# Patient Record
Sex: Female | Born: 1965 | Race: White | Hispanic: No | Smoking: Current every day smoker
Health system: Southern US, Community
[De-identification: ages and names within clinical notes are randomized; demographics above are authoritative.]

## PROBLEM LIST (undated history)

## (undated) DIAGNOSIS — I10 Essential (primary) hypertension: Secondary | ICD-10-CM

## (undated) DIAGNOSIS — G4730 Sleep apnea, unspecified: Secondary | ICD-10-CM

## (undated) DIAGNOSIS — C801 Malignant (primary) neoplasm, unspecified: Secondary | ICD-10-CM

## (undated) DIAGNOSIS — M79671 Pain in right foot: Secondary | ICD-10-CM

## (undated) DIAGNOSIS — T4145XA Adverse effect of unspecified anesthetic, initial encounter: Secondary | ICD-10-CM

## (undated) DIAGNOSIS — B351 Tinea unguium: Secondary | ICD-10-CM

## (undated) DIAGNOSIS — F329 Major depressive disorder, single episode, unspecified: Secondary | ICD-10-CM

## (undated) DIAGNOSIS — IMO0001 Reserved for inherently not codable concepts without codable children: Secondary | ICD-10-CM

## (undated) DIAGNOSIS — T8859XA Other complications of anesthesia, initial encounter: Secondary | ICD-10-CM

## (undated) DIAGNOSIS — J449 Chronic obstructive pulmonary disease, unspecified: Secondary | ICD-10-CM

## (undated) DIAGNOSIS — F32A Depression, unspecified: Secondary | ICD-10-CM

## (undated) DIAGNOSIS — M1990 Unspecified osteoarthritis, unspecified site: Secondary | ICD-10-CM

## (undated) DIAGNOSIS — F419 Anxiety disorder, unspecified: Secondary | ICD-10-CM

## (undated) HISTORY — PX: HERNIA REPAIR: SHX51

## (undated) HISTORY — PX: BREAST ENHANCEMENT SURGERY: SHX7

## (undated) HISTORY — PX: ANKLE FRACTURE SURGERY: SHX122

## (undated) HISTORY — DX: Major depressive disorder, single episode, unspecified: F32.9

## (undated) HISTORY — PX: WRIST FUSION: SHX839

## (undated) HISTORY — DX: Depression, unspecified: F32.A

## (undated) HISTORY — DX: Pain in right foot: M79.671

## (undated) HISTORY — PX: NASAL POLYP SURGERY: SHX186

## (undated) HISTORY — PX: POLYPECTOMY: SHX149

## (undated) HISTORY — PX: CARPAL TUNNEL RELEASE: SHX101

## (undated) HISTORY — DX: Malignant (primary) neoplasm, unspecified: C80.1

## (undated) SURGERY — COLONOSCOPY
Anesthesia: Monitor Anesthesia Care

---

## 2002-07-31 ENCOUNTER — Emergency Department (HOSPITAL_COMMUNITY): Admission: EM | Admit: 2002-07-31 | Discharge: 2002-07-31 | Payer: Self-pay | Admitting: Emergency Medicine

## 2002-07-31 ENCOUNTER — Encounter: Payer: Self-pay | Admitting: Emergency Medicine

## 2002-10-18 ENCOUNTER — Encounter: Payer: Self-pay | Admitting: Family Medicine

## 2002-10-18 ENCOUNTER — Ambulatory Visit (HOSPITAL_COMMUNITY): Admission: RE | Admit: 2002-10-18 | Discharge: 2002-10-18 | Payer: Self-pay | Admitting: Family Medicine

## 2002-11-23 ENCOUNTER — Emergency Department (HOSPITAL_COMMUNITY): Admission: EM | Admit: 2002-11-23 | Discharge: 2002-11-24 | Payer: Self-pay | Admitting: *Deleted

## 2003-04-12 ENCOUNTER — Emergency Department (HOSPITAL_COMMUNITY): Admission: EM | Admit: 2003-04-12 | Discharge: 2003-04-12 | Payer: Self-pay | Admitting: Emergency Medicine

## 2004-04-09 ENCOUNTER — Ambulatory Visit: Payer: Self-pay | Admitting: Family Medicine

## 2004-04-16 ENCOUNTER — Ambulatory Visit: Payer: Self-pay | Admitting: Family Medicine

## 2004-05-29 ENCOUNTER — Ambulatory Visit: Payer: Self-pay | Admitting: Family Medicine

## 2004-10-30 ENCOUNTER — Ambulatory Visit: Payer: Self-pay | Admitting: Family Medicine

## 2005-03-05 ENCOUNTER — Ambulatory Visit: Payer: Self-pay | Admitting: Family Medicine

## 2005-03-10 ENCOUNTER — Ambulatory Visit (HOSPITAL_COMMUNITY): Admission: RE | Admit: 2005-03-10 | Discharge: 2005-03-10 | Payer: Self-pay | Admitting: Family Medicine

## 2005-04-01 ENCOUNTER — Ambulatory Visit: Payer: Self-pay | Admitting: Orthopedic Surgery

## 2005-04-21 ENCOUNTER — Ambulatory Visit (HOSPITAL_COMMUNITY): Admission: RE | Admit: 2005-04-21 | Discharge: 2005-04-21 | Payer: Self-pay | Admitting: Orthopedic Surgery

## 2005-04-23 ENCOUNTER — Ambulatory Visit: Payer: Self-pay | Admitting: Orthopedic Surgery

## 2005-04-30 ENCOUNTER — Ambulatory Visit: Payer: Self-pay | Admitting: Orthopedic Surgery

## 2005-05-05 ENCOUNTER — Ambulatory Visit: Payer: Self-pay | Admitting: Orthopedic Surgery

## 2005-05-14 ENCOUNTER — Ambulatory Visit: Payer: Self-pay | Admitting: Orthopedic Surgery

## 2005-05-28 ENCOUNTER — Ambulatory Visit: Payer: Self-pay | Admitting: Orthopedic Surgery

## 2005-06-25 ENCOUNTER — Ambulatory Visit: Payer: Self-pay | Admitting: Orthopedic Surgery

## 2005-07-02 ENCOUNTER — Ambulatory Visit: Payer: Self-pay | Admitting: Family Medicine

## 2005-07-06 ENCOUNTER — Ambulatory Visit (HOSPITAL_COMMUNITY): Admission: RE | Admit: 2005-07-06 | Discharge: 2005-07-06 | Payer: Self-pay | Admitting: Family Medicine

## 2005-08-06 ENCOUNTER — Encounter (INDEPENDENT_AMBULATORY_CARE_PROVIDER_SITE_OTHER): Payer: Self-pay | Admitting: *Deleted

## 2005-08-06 ENCOUNTER — Other Ambulatory Visit: Admission: RE | Admit: 2005-08-06 | Discharge: 2005-08-06 | Payer: Self-pay | Admitting: Family Medicine

## 2005-08-06 ENCOUNTER — Ambulatory Visit: Payer: Self-pay | Admitting: Family Medicine

## 2005-08-06 LAB — CONVERTED CEMR LAB: Pap Smear: NORMAL

## 2005-08-07 ENCOUNTER — Ambulatory Visit (HOSPITAL_COMMUNITY): Admission: RE | Admit: 2005-08-07 | Discharge: 2005-08-07 | Payer: Self-pay | Admitting: Family Medicine

## 2006-02-02 ENCOUNTER — Ambulatory Visit: Payer: Self-pay | Admitting: Family Medicine

## 2006-06-10 ENCOUNTER — Ambulatory Visit: Payer: Self-pay | Admitting: Family Medicine

## 2006-08-09 ENCOUNTER — Ambulatory Visit: Payer: Self-pay | Admitting: Family Medicine

## 2006-08-09 ENCOUNTER — Other Ambulatory Visit: Admission: RE | Admit: 2006-08-09 | Discharge: 2006-08-09 | Payer: Self-pay | Admitting: Family Medicine

## 2006-08-09 ENCOUNTER — Encounter: Payer: Self-pay | Admitting: Family Medicine

## 2006-08-09 ENCOUNTER — Encounter (INDEPENDENT_AMBULATORY_CARE_PROVIDER_SITE_OTHER): Payer: Self-pay | Admitting: *Deleted

## 2006-08-09 LAB — CONVERTED CEMR LAB: Pap Smear: NORMAL

## 2006-12-16 ENCOUNTER — Ambulatory Visit (HOSPITAL_COMMUNITY): Admission: RE | Admit: 2006-12-16 | Discharge: 2006-12-16 | Payer: Self-pay | Admitting: Family Medicine

## 2007-02-24 ENCOUNTER — Encounter: Payer: Self-pay | Admitting: *Deleted

## 2007-02-24 DIAGNOSIS — L02419 Cutaneous abscess of limb, unspecified: Secondary | ICD-10-CM

## 2007-02-24 DIAGNOSIS — I10 Essential (primary) hypertension: Secondary | ICD-10-CM

## 2007-02-24 DIAGNOSIS — L03119 Cellulitis of unspecified part of limb: Secondary | ICD-10-CM

## 2007-02-24 DIAGNOSIS — G43909 Migraine, unspecified, not intractable, without status migrainosus: Secondary | ICD-10-CM

## 2007-07-05 ENCOUNTER — Ambulatory Visit: Payer: Self-pay | Admitting: Family Medicine

## 2007-08-11 ENCOUNTER — Encounter: Payer: Self-pay | Admitting: Family Medicine

## 2007-08-11 LAB — CONVERTED CEMR LAB
BUN: 14 mg/dL (ref 6–23)
Basophils Absolute: 0 10*3/uL (ref 0.0–0.1)
Basophils Relative: 0 % (ref 0–1)
CO2: 21 meq/L (ref 19–32)
Calcium: 9.4 mg/dL (ref 8.4–10.5)
Chloride: 104 meq/L (ref 96–112)
Cholesterol: 160 mg/dL (ref 0–200)
Creatinine, Ser: 0.75 mg/dL (ref 0.40–1.20)
Eosinophils Absolute: 0.1 10*3/uL (ref 0.0–0.7)
Eosinophils Relative: 1 % (ref 0–5)
Glucose, Bld: 97 mg/dL (ref 70–99)
HCT: 40.8 % (ref 36.0–46.0)
HDL: 72 mg/dL (ref >39)
Hemoglobin: 13.9 g/dL (ref 12.0–15.0)
LDL Cholesterol: 77 mg/dL (ref 0–99)
Lymphocytes Relative: 23 % (ref 12–46)
Lymphs Abs: 2.2 10*3/uL (ref 0.7–4.0)
MCHC: 34.1 g/dL (ref 30.0–36.0)
MCV: 92.5 fL (ref 78.0–100.0)
Monocytes Absolute: 0.4 10*3/uL (ref 0.1–1.0)
Monocytes Relative: 4 % (ref 3–12)
Neutro Abs: 6.8 10*3/uL (ref 1.7–7.7)
Neutrophils Relative %: 72 % (ref 43–77)
Platelets: 234 10*3/uL (ref 150–400)
Potassium: 4.1 meq/L (ref 3.5–5.3)
RBC: 4.41 M/uL (ref 3.87–5.11)
RDW: 13.6 % (ref 11.5–15.5)
Sodium: 139 meq/L (ref 135–145)
Total CHOL/HDL Ratio: 2.2
Triglycerides: 54 mg/dL (ref <150)
VLDL: 11 mg/dL (ref 0–40)
WBC: 9.5 10*3/uL (ref 4.0–10.5)

## 2007-09-01 ENCOUNTER — Other Ambulatory Visit: Admission: RE | Admit: 2007-09-01 | Discharge: 2007-09-01 | Payer: Self-pay | Admitting: Family Medicine

## 2007-09-01 ENCOUNTER — Ambulatory Visit: Payer: Self-pay | Admitting: Family Medicine

## 2007-09-01 ENCOUNTER — Encounter: Payer: Self-pay | Admitting: Family Medicine

## 2007-09-01 LAB — CONVERTED CEMR LAB: Pap Smear: ABNORMAL

## 2007-09-13 ENCOUNTER — Encounter: Payer: Self-pay | Admitting: Family Medicine

## 2007-11-11 ENCOUNTER — Encounter: Payer: Self-pay | Admitting: Family Medicine

## 2007-11-11 ENCOUNTER — Ambulatory Visit (HOSPITAL_COMMUNITY): Admission: RE | Admit: 2007-11-11 | Discharge: 2007-11-11 | Payer: Self-pay | Admitting: General Surgery

## 2007-12-21 ENCOUNTER — Ambulatory Visit (HOSPITAL_COMMUNITY): Admission: RE | Admit: 2007-12-21 | Discharge: 2007-12-21 | Payer: Self-pay | Admitting: Family Medicine

## 2008-01-18 ENCOUNTER — Ambulatory Visit: Payer: Self-pay | Admitting: Family Medicine

## 2008-05-03 ENCOUNTER — Telehealth: Payer: Self-pay | Admitting: Family Medicine

## 2008-07-13 ENCOUNTER — Encounter: Payer: Self-pay | Admitting: Family Medicine

## 2008-08-17 ENCOUNTER — Emergency Department (HOSPITAL_COMMUNITY): Admission: EM | Admit: 2008-08-17 | Discharge: 2008-08-17 | Payer: Self-pay | Admitting: Emergency Medicine

## 2008-10-01 ENCOUNTER — Telehealth: Payer: Self-pay | Admitting: Family Medicine

## 2008-10-08 ENCOUNTER — Ambulatory Visit: Payer: Self-pay | Admitting: Family Medicine

## 2008-10-08 ENCOUNTER — Other Ambulatory Visit: Admission: RE | Admit: 2008-10-08 | Discharge: 2008-10-08 | Payer: Self-pay | Admitting: Family Medicine

## 2008-10-08 ENCOUNTER — Encounter: Payer: Self-pay | Admitting: Family Medicine

## 2008-10-08 LAB — CONVERTED CEMR LAB: OCCULT 1: NEGATIVE

## 2008-10-09 DIAGNOSIS — F17200 Nicotine dependence, unspecified, uncomplicated: Secondary | ICD-10-CM

## 2008-10-10 ENCOUNTER — Telehealth: Payer: Self-pay | Admitting: Family Medicine

## 2008-11-19 ENCOUNTER — Telehealth: Payer: Self-pay | Admitting: Family Medicine

## 2008-12-12 ENCOUNTER — Ambulatory Visit: Payer: Self-pay | Admitting: Family Medicine

## 2008-12-24 ENCOUNTER — Ambulatory Visit (HOSPITAL_COMMUNITY): Admission: RE | Admit: 2008-12-24 | Discharge: 2008-12-24 | Payer: Self-pay | Admitting: Family Medicine

## 2009-04-17 ENCOUNTER — Telehealth: Payer: Self-pay | Admitting: Family Medicine

## 2009-06-11 ENCOUNTER — Other Ambulatory Visit: Admission: RE | Admit: 2009-06-11 | Discharge: 2009-06-11 | Payer: Self-pay | Admitting: Family Medicine

## 2009-06-11 ENCOUNTER — Ambulatory Visit: Payer: Self-pay | Admitting: Family Medicine

## 2009-06-11 DIAGNOSIS — R5381 Other malaise: Secondary | ICD-10-CM

## 2009-06-11 DIAGNOSIS — R5383 Other fatigue: Secondary | ICD-10-CM

## 2009-06-11 DIAGNOSIS — L989 Disorder of the skin and subcutaneous tissue, unspecified: Secondary | ICD-10-CM | POA: Insufficient documentation

## 2009-06-11 LAB — CONVERTED CEMR LAB
OCCULT 1: NEGATIVE
Pap Smear: NEGATIVE

## 2009-06-12 ENCOUNTER — Encounter: Payer: Self-pay | Admitting: Family Medicine

## 2009-06-19 ENCOUNTER — Telehealth: Payer: Self-pay | Admitting: Family Medicine

## 2009-08-13 ENCOUNTER — Ambulatory Visit: Payer: Self-pay | Admitting: Family Medicine

## 2009-09-26 ENCOUNTER — Encounter: Payer: Self-pay | Admitting: Family Medicine

## 2009-09-27 ENCOUNTER — Ambulatory Visit: Admission: RE | Admit: 2009-09-27 | Discharge: 2009-09-27 | Payer: Self-pay | Admitting: Family Medicine

## 2009-10-09 ENCOUNTER — Encounter: Payer: Self-pay | Admitting: Family Medicine

## 2009-10-15 ENCOUNTER — Ambulatory Visit: Payer: Self-pay | Admitting: Family Medicine

## 2009-10-16 ENCOUNTER — Encounter: Payer: Self-pay | Admitting: Family Medicine

## 2009-10-16 LAB — CONVERTED CEMR LAB
BUN: 15 mg/dL (ref 6–23)
Basophils Absolute: 0 10*3/uL (ref 0.0–0.1)
Basophils Relative: 0 % (ref 0–1)
CO2: 21 meq/L (ref 19–32)
Calcium: 8.5 mg/dL (ref 8.4–10.5)
Chloride: 106 meq/L (ref 96–112)
Cholesterol: 184 mg/dL (ref 0–200)
Creatinine, Ser: 0.63 mg/dL (ref 0.40–1.20)
Eosinophils Absolute: 0.1 10*3/uL (ref 0.0–0.7)
Eosinophils Relative: 1 % (ref 0–5)
Glucose, Bld: 97 mg/dL (ref 70–99)
HCT: 40.9 % (ref 36.0–46.0)
HDL: 64 mg/dL (ref >39)
Hemoglobin: 14 g/dL (ref 12.0–15.0)
LDL Cholesterol: 103 mg/dL — ABNORMAL HIGH (ref 0–99)
Lymphocytes Relative: 30 % (ref 12–46)
Lymphs Abs: 2.8 10*3/uL (ref 0.7–4.0)
MCHC: 34.2 g/dL (ref 30.0–36.0)
MCV: 90.5 fL (ref 78.0–100.0)
Monocytes Absolute: 0.5 10*3/uL (ref 0.1–1.0)
Monocytes Relative: 5 % (ref 3–12)
Neutro Abs: 6 10*3/uL (ref 1.7–7.7)
Neutrophils Relative %: 64 % (ref 43–77)
Platelets: 245 10*3/uL (ref 150–400)
Potassium: 4.2 meq/L (ref 3.5–5.3)
RBC: 4.52 M/uL (ref 3.87–5.11)
RDW: 13.2 % (ref 11.5–15.5)
Sodium: 140 meq/L (ref 135–145)
TSH: 2.598 microintl units/mL (ref 0.350–4.500)
Total CHOL/HDL Ratio: 2.9
Triglycerides: 84 mg/dL (ref <150)
VLDL: 17 mg/dL (ref 0–40)
Vit D, 25-Hydroxy: 33 ng/mL (ref 30–89)
WBC: 9.4 10*3/uL (ref 4.0–10.5)

## 2009-10-22 DIAGNOSIS — E669 Obesity, unspecified: Secondary | ICD-10-CM

## 2009-10-22 DIAGNOSIS — F418 Other specified anxiety disorders: Secondary | ICD-10-CM

## 2009-10-22 DIAGNOSIS — E663 Overweight: Secondary | ICD-10-CM | POA: Insufficient documentation

## 2010-01-06 ENCOUNTER — Ambulatory Visit (HOSPITAL_COMMUNITY): Admission: RE | Admit: 2010-01-06 | Discharge: 2010-01-06 | Payer: Self-pay | Admitting: Family Medicine

## 2010-04-08 NOTE — Letter (Signed)
Summary: medical info for [facility]  medical info for [facility]   Imported By: Lind Guest 06/12/2009 15:06:26  _____________________________________________________________________  External Attachment:    Type:   Image     Comment:   External Document

## 2010-04-08 NOTE — Progress Notes (Signed)
Summary: [facility]  [facility]   Imported By: Lind Guest 10/18/2009 15:42:59  _____________________________________________________________________  External Attachment:    Type:   Image     Comment:   External Document

## 2010-04-08 NOTE — Progress Notes (Signed)
Summary: refills  Phone Note Call from Patient   Summary of Call: pt has ano refills on meds. But needs to get refilled. Making her appt to come in. pt has appt to come in . but not soon because schedule full [phone]  Initial call taken by: Rudene Anda,  April 17, 2009 11:07 AM  Follow-up for Phone Call        returned call, unavailable meds sent Follow-up by: Worthy Keeler LPN,  April 17, 2009 11:18 AM    Prescriptions: BENICAR HCT 20-12.5 MG TABS (OLMESARTAN MEDOXOMIL-HCTZ) Take 1 tablet by mouth once a day  #30 Each x 2   Entered by:   Worthy Keeler LPN   Authorized by:   Syliva Overman MD   Signed by:   Worthy Keeler LPN on 78/46/9629   Method used:   Electronically to        [facility]  [address] (retail)       [address]       [place], [place]  [address]       Ph: [phone]       Fax: [phone]   RxID:   [id number] VENLAFAXINE HCL 75 MG  TABS (VENLAFAXINE HCL) Take 1 tablet by mouth once a day  #30 Each x 2   Entered by:   Worthy Keeler LPN   Authorized by:   Syliva Overman MD   Signed by:   Worthy Keeler LPN on 51/88/4166   Method used:   Electronically to        [facility]  [address] (retail)       [address]       [place], [place]  [address]       Ph: [phone]       Fax: [phone]   RxID:   [id number] GABAPENTIN 800 MG  TABS (GABAPENTIN) Take one tablet by mouth three times a day  #90 x 2   Entered by:   Worthy Keeler LPN   Authorized by:   Syliva Overman MD   Signed by:   Worthy Keeler LPN on 07/37/1062   Method used:   Electronically to        [facility]  [address] (retail)       [address]       [place], [place]  [address]       Ph: [phone]       Fax: [phone]   RxID:   [id number] WELLBUTRIN SR 150 MG  TB12 (BUPROPION HCL) Take one tablet by mouth once a day  #30 x 2   Entered by:   Worthy Keeler LPN   Authorized by:   Syliva Overman MD   Signed by:    Worthy Keeler LPN on 58/52/7782   Method used:   Electronically to        [facility]  [address] (retail)       [address]       [place], [place]  [address]       Ph: [phone]       Fax: [phone]   RxID:   [id number]

## 2010-04-08 NOTE — Assessment & Plan Note (Signed)
Summary: physical   Vital Signs:  Patient profile:   45 year old female Menstrual status:  perimenopausal Height:      62.5 inches Weight:      174.50 pounds BMI:     31.52 O2 Sat:      96 % Pulse rate:   78 / minute Pulse rhythm:   regular Resp:     16 per minute BP sitting:   114 / 78  (left arm) Cuff size:   regular  Vitals Entered By: Everitt Amber LPN (June 12, 5282 2:41 PM)  Nutrition Counseling: Patient's BMI is greater than 25 and therefore counseled on weight management options.  Vision Screening:Left eye w/o correction: 20 / 25 Right Eye w/o correction: 20 / 30 Both eyes w/o correction:  20/ 25  Color vision testing: normal      Vision Entered By: Everitt Amber LPN (June 12, 1322 2:41 PM)   History of Present Illness: pt c/o increasedanduncontrolled headaches, no localised weakness or numbness, like hwer classic migraine, sbitemporal butsometimesextending to post neck. she also is concerned about a 14 pound weight gain and wants to resume phentermine. She is still smking and is trying to quit. she recently developed a blister on herabdomes after beingin thetanning bed and wants this checked.  Preventive Screening-Counseling & Management  Alcohol-Tobacco     Smoking Cessation Counseling: yes  Current Medications (verified): 1)  Wellbutrin Sr 150 Mg  Tb12 (Bupropion Hcl) .... Take One Tablet By Mouth Once A Day 2)  Gabapentin 800 Mg  Tabs (Gabapentin) .... Take One Tablet By Mouth Three Times A Day 3)  Venlafaxine Hcl 75 Mg  Tabs (Venlafaxine Hcl) .... Take 1 Tablet By Mouth Once A Day 4)  Benicar Hct 20-12.5 Mg Tabs (Olmesartan Medoxomil-Hctz) .... Take 1 Tablet By Mouth Once A Day 5)  Flexeril 10 Mg Tabs (Cyclobenzaprine Hcl) .... Take 1 Tab By Mouth At Bedtime  As Needed 6)  Phentermine Hcl 37.5 Mg Tabs (Phentermine Hcl) .... Take 1 Tablet By Mouth Once A Day  Allergies (verified): No Known Drug Allergies  Review of Systems      See HPI General:   Denies chills, fatigue, and fever. Eyes:  Complains of blurring; denies discharge, eye pain, and red eye; states she has never been able to see well with herglasses which are over 2 yrsold, she does not use hem much. ENT:  Denies earache, hoarseness, nasal congestion, and sinus pressure. CV:  Denies chest pain or discomfort, difficulty breathing while lying down, palpitations, and swelling of feet. Resp:  Denies cough and sputum productive. GI:  Denies abdominal pain, constipation, diarrhea, nausea, and vomiting. GU:  Denies dysuria, incontinence, and urinary frequency. MS:  Denies joint pain, low back pain, mid back pain, and stiffness. Derm:  Complains of lesion(s); red blistering area on her ant abdomen which came up after she had a tanniong session l;ast week, goes on avg once weekly. Neuro:  Complains of headaches; 5 m,onth h/o dauily bitemporal throbbing headaches associated with nausea, only using goodies owders. Also has pain at the back opf the neck fro her  shoulder has respoded well to injections in the past. Psych:  Complains of anxiety and depression; denies irritability, mental problems, suicidal thoughts/plans, thoughts of violence, and unusual visions or sounds. Endo:  Denies cold intolerance, excessive hunger, excessive thirst, excessive urination, heat intolerance, polyuria, and weight change. Heme:  Complains of abnormal bruising; denies bleeding; chronic bruising on legsa. Allergy:  Complains of seasonal  allergies.  Physical Exam  General:  Well-developed,well-nourished,in no acute distress; alert,appropriate and cooperative throughout examination Head:  Normocephalic and atraumatic without obvious abnormalities. No apparent alopecia or balding. Eyes:  No corneal or conjunctival inflammation noted. EOMI. Perrla. Funduscopic exam benign, without hemorrhages, exudates or papilledema. Vision grossly normal. Ears:  External ear exam shows no significant lesions or deformities.   Otoscopic examination reveals clear canals, tympanic membranes are intact bilaterally without bulging, retraction, inflammation or discharge. Hearing is grossly normal bilaterally. Nose:  External nasal examination shows no deformity or inflammation. Nasal mucosa are pink and moist without lesions or exudates. Mouth:  Oral mucosa and oropharynx without lesions or exudates.  Teeth in good repair. Neck:  No deformities, masses, or tenderness noted. Chest Wall:  No deformities, masses, or tenderness noted. Breasts:  No mass, nodules, thickening, tenderness, bulging, retraction, inflamation, nipple discharge or skin changes noted.   Lungs:  Normal respiratory effort, chest expands symmetrically. Lungs are clear to auscultation, no crackles or wheezes.decreased throughout Heart:  Normal rate and regular rhythm. S1 and S2 normal without gallop, murmur, click, rub or other extra sounds. Abdomen:  Bowel sounds positive,abdomen soft and non-tender without masses, organomegaly or hernias noted. Rectal:  No external abnormalities noted. Normal sphincter tone. No rectal masses or tenderness.guaic neg stool Genitalia:  Normal introitus for age, no external lesions, no vaginal discharge, mucosa pink and moist, no vaginal or cervical lesions, no vaginal atrophy, no friaility or hemorrhage, normal uterus size and position, no adnexal masses or tenderness Msk:  No deformity or scoliosis noted of thoracic or lumbar spine.   Pulses:  R and L carotid,radial,femoral,dorsalis pedis and posterior tibial pulses are full and equal bilaterally Extremities:  No clubbing, cyanosis, edema, or deformity noted with normal full range of motion of all joints.   Neurologic:  No cranial nerve deficits noted. Station and gait are normal. Plantar reflexes are down-going bilaterally. DTRs are symmetrical throughout. Sensory, motor and coordinative functions appear intact. Skin:  erytematous macular lesion on anterior abdominal wall.no  skin breakdown Cervical Nodes:  No lymphadenopathy noted Axillary Nodes:  No palpable lymphadenopathy Inguinal Nodes:  No significant adenopathy Psych:  Cognition and judgment appear intact. Alert and cooperative with normal attention span and concentration. No apparent delusions, illusions, hallucinations   Impression & Recommendations:  Problem # 1:  SPECIAL SCREENING FOR MALIGNANT NEOPLASMS COLON (ICD-V76.51) Assessment Comment Only  Orders: Hemoccult Guaiac-1 spec.(in office) (82270) negative  Problem # 2:  NICOTINE ADDICTION (ICD-305.1) Assessment: Unchanged  Encouraged smoking cessation and discussed different methods for smoking cessation.   Problem # 3:  SCREENING FOR MALIGNANT NEOPLASM OF THE CERVIX (ICD-V76.2) Assessment: Comment Only  Orders: Pap Smear ([address])  Problem # 4:  OVERWEIGHT (ICD-278.02) Assessment: Deteriorated  Ht: 62.5 (06/11/2009)   Wt: 174.50 (06/11/2009)   BMI: 31.52 (06/11/2009)  Problem # 5:  MIGRAINE HEADACHE (ICD-346.90) Assessment: Deteriorated  Her updated medication list for this problem includes:    Maxalt-mlt 10 Mg Tbdp (Rizatriptan benzoate) ..... One at headache onset may repeat after 2 hrs if needed , maximum of 2 tablets in 24 hours, max twice weekly  Orders: Neurology Referral (Neuro)  Problem # 6:  ESSENTIAL HYPERTENSION, BENIGN (ICD-401.1) Assessment: Unchanged  Her updated medication list for this problem includes:    Benicar Hct 20-12.5 Mg Tabs (Olmesartan medoxomil-hctz) .Marland Kitchen... Take 1 tablet by mouth once a day  Orders: T-Basic Metabolic Panel [phone])  BP today: 114/78 Prior BP: 110/80 (12/12/2008)  Labs Reviewed: K+: 4.1 (08/11/2007) Creat: :  0.75 (08/11/2007)   Chol: 160 (08/11/2007)   HDL: 72 (08/11/2007)   LDL: 77 (08/11/2007)   TG: 54 (08/11/2007)  Problem # 7:  SKIN LESION (ICD-709.9) Assessment: Comment Only pt advised against tanning bed, she is to use antibacterial oint twice daily for 3 weeks if  rash persits she is to call back for derm referral  Complete Medication List: 1)  Wellbutrin Sr 150 Mg Tb12 (Bupropion hcl) .... Take one tablet by mouth once a day 2)  Gabapentin 800 Mg Tabs (Gabapentin) .... Take one tablet by mouth three times a day 3)  Venlafaxine Hcl 75 Mg Tabs (Venlafaxine hcl) .... Take 1 tablet by mouth once a day 4)  Benicar Hct 20-12.5 Mg Tabs (Olmesartan medoxomil-hctz) .... Take 1 tablet by mouth once a day 5)  Flexeril 10 Mg Tabs (Cyclobenzaprine hcl) .... Take 1 tab by mouth at bedtime  as needed 6)  Phentermine Hcl 37.5 Mg Tabs (Phentermine hcl) .... Take 1 tablet by mouth once a day 7)  Maxalt-mlt 10 Mg Tbdp (Rizatriptan benzoate) .... One at headache onset may repeat after 2 hrs if needed , maximum of 2 tablets in 24 hours, max twice weekly  Other Orders: T-Lipid Profile ([id number]) T-CBC w/Diff [phone]) T-TSH [phone]) T-Vitamin D (25-Hydroxy) [phone])  Patient Instructions: 1)  Please schedule a follow-up appointment in 2 months. 2)  It is important that you exercise regularly at least 20 minutes 5 times a week. If you develop chest pain, have severe difficulty breathing, or feel very tired , stop exercising immediately and seek medical attention. 3)  You need to lose weight. Consider a lower calorie diet and regular exercise.  4)  Tobacco is very bad for your health and your loved ones! You Should stop smoking!. 5)  Stop Smoking Tips: Choose a Quit date. Cut down before the Quit date. decide what you will do as a substitute when you feel the urge to smoke(gum,toothpick,exercise). 6)  BMP prior to visit, ICD-9: 7)  Lipid Panel prior to visit, ICD-9: 8)  TSH prior to visit, ICD-9:   fasting asap 9)  CBC w/ Diff prior to visit, ICD-9: 10)  Vit D 11)  You will be referred to the headache clininc, and pls call back in 3 weeks for referral to dermatology if the rash on your stomach is still there . pls use antibiotic oint twicedaily, and stay  away from the tanning beds pls Prescriptions: PHENTERMINE HCL 37.5 MG TABS (PHENTERMINE HCL) Take 1 tablet by mouth once a day  #30 x 1   Entered by:   Everitt Amber LPN   Authorized by:   Syliva Overman MD   Signed by:   Everitt Amber LPN on 24/40/1027   Method used:   Printed then faxed to ...       [facility]  [address] [facility] (retail)       [address]       [place], [place]  [address]       Ph: [phone]       Fax: [phone]   RxID:   [id number] MAXALT-MLT 10 MG TBDP (RIZATRIPTAN BENZOATE) one at headache onset may repeat after 2 hrs if needed , maximum of 2 tablets in 24 hours, max twice weekly  #8 x 3   Entered and Authorized by:   Syliva Overman MD   Signed by:   Syliva Overman MD on 06/11/2009   Method used:   Electronically  to        [facility] [address] (retail)       [address]       [place], [place]  [address]       Ph: [phone]       Fax: [phone]   RxID:   [id number]   Laboratory Results    Stool - Occult Blood Hemmoccult #1: negative Date: 06/11/2009 Comments: [id number] 9R 8/11 118 1012

## 2010-04-08 NOTE — Assessment & Plan Note (Signed)
Summary: office visit   Vital Signs:  Patient profile:   45 year old female Menstrual status:  perimenopausal Height:      62.5 inches Weight:      174.25 pounds BMI:     31.48 O2 Sat:      97 % Pulse rate:   83 / minute Pulse rhythm:   regular Resp:     16 per minute BP sitting:   114 / 78 Cuff size:   regular  Vitals Entered By: Everitt Amber LPN (August 14, 9627 4:43 PM)  Nutrition Counseling: Patient's BMI is greater than 25 and therefore counseled on weight management options. CC: Follow up chronic problems, migraines are getting worse   CC:  Follow up chronic problems and migraines are getting worse.  History of Present Illness: Reports  that t she is doing fairly well, except that her migraines are worsening, and she continues to smoke. She has an upcoming appt with neurology. She has not been consistent with the phentermine, states she does better when she takes it consistently and wants to do so. Denies recent fever or chills. Denies sinus pressure, nasal congestion , ear pain or sore throat. Denies chest congestion, or cough productive of sputum. Denies chest pain, palpitations, PND, orthopnea or leg swelling. Denies abdominal pain, nausea, vomitting, diarrhea or constipation. Denies change in bowel movements or bloody stool. Denies dysuria , frequency, incontinence or hesitancy. Denies  joint pain, swelling, or reduced mobility. Denies , vertigo, seizures. Denies depression, anxiety or insomnia.    Preventive Screening-Counseling & Management  Alcohol-Tobacco     Smoking Cessation Counseling: yes  Allergies: No Known Drug Allergies  Review of Systems      See HPI General:  Complains of fatigue and sleep disorder; reports excessive daytoime sleepiness and excessive snoring. Eyes:  Denies blurring and discharge. Derm:  Complains of lesion(s); concerned about lesion which appears to have changed on her leg, wants skin eval. Neuro:  Complains of headaches;  denies difficulty with concentration, disturbances in coordination, poor balance, seizures, and sensation of room spinning. Psych:  Complains of anxiety and depression; denies suicidal thoughts/plans, thoughts of violence, and unusual visions or sounds; controlled on meds, and improved. Allergy:  Denies hives or rash and itching eyes.  Physical Exam  General:  Well-developed,well-nourished,in no acute distress; alert,appropriate and cooperative throughout examination HEENT: No facial asymmetry,  EOMI, No sinus tenderness, TM's Clear, oropharynx  pink and moist.   Chest: Clear to auscultation bilaterally.  CVS: S1, S2, No murmurs, No S3.   Abd: Soft, Nontender.  MS: Adequate ROM spine, hips, shoulders and knees.  Ext: No edema.   CNS: CN 2-12 intact, power tone and sensation normal throughout.   Skin: Intact,  freckled with hyperpigmented lesions, h/o tanning beds and excessive sun exposure.  Psych: Good eye contact, normal affect.  Memory intact, not anxious or depressed appearing.    Impression & Recommendations:  Problem # 1:  SKIN LESION (ICD-709.9) Assessment Comment Only  Future Orders: Dermatology Referral (Derma) ... 08/14/2009  Problem # 2:  FATIGUE (ICD-780.79) Assessment: Deteriorated  Orders: Sleep Disorder Referral (Sleep Disorder)  Problem # 3:  NICOTINE ADDICTION (ICD-305.1) Assessment: Unchanged  Encouraged smoking cessation and discussed different methods for smoking cessation.   Problem # 4:  MIGRAINE HEADACHE (ICD-346.90) Assessment: Deteriorated  Her updated medication list for this problem includes:    Maxalt-mlt 10 Mg Tbdp (Rizatriptan benzoate) ..... One at headache onset may repeat after 2 hrs if needed , maximum  of 2 tablets in 24 hours, max twice weekly has eval by neurology in the near future  Problem # 5:  ESSENTIAL HYPERTENSION, BENIGN (ICD-401.1) Assessment: Unchanged  Her updated medication list for this problem includes:    Benicar Hct  20-12.5 Mg Tabs (Olmesartan medoxomil-hctz) .Marland Kitchen... Take 1 tablet by mouth once a day  BP today: 114/78 Prior BP: 114/78 (06/11/2009)  Labs Reviewed: K+: 4.1 (08/11/2007) Creat: : 0.75 (08/11/2007)   Chol: 160 (08/11/2007)   HDL: 72 (08/11/2007)   LDL: 77 (08/11/2007)   TG: 54 (08/11/2007)  Problem # 6:  ANXIETY DEPRESSION (ICD-300.4) Assessment: Improved continue velafexine and wellbutrin  Problem # 7:  NICOTINE ADDICTION (ICD-305.1)  Problem # 8:  OVERWEIGHT (ICD-278.02)  Complete Medication List: 1)  Wellbutrin Sr 150 Mg Tb12 (Bupropion hcl) .... Take one tablet by mouth once a day 2)  Gabapentin 800 Mg Tabs (Gabapentin) .... Take one tablet by mouth three times a day 3)  Venlafaxine Hcl 75 Mg Tabs (Venlafaxine hcl) .... Take 1 tablet by mouth once a day 4)  Benicar Hct 20-12.5 Mg Tabs (Olmesartan medoxomil-hctz) .... Take 1 tablet by mouth once a day 5)  Flexeril 10 Mg Tabs (Cyclobenzaprine hcl) .... Take 1 tab by mouth at bedtime  as needed 6)  Maxalt-mlt 10 Mg Tbdp (Rizatriptan benzoate) .... One at headache onset may repeat after 2 hrs if needed , maximum of 2 tablets in 24 hours, max twice weekly 7)  Phentermine Hcl 37.5 Mg Tabs (Phentermine hcl) .... Take 1 tablet by mouth once a day  Patient Instructions: 1)  Please schedule a follow-up appointment in 2 months. 2)  Tobacco is very bad for your health and your loved ones! You Should stop smoking!. 3)  Stop Smoking Tips: Choose a Quit date. Cut down before the Quit date. decide what you will do as a substitute when you feel the urge to smoke(gum,toothpick,exercise). 4)  inc dose of phentermine toione daily, also pls commit to 30 mins at least 5 days per week of exercise 5)  You need to see a skin doctor, we wil refer you.We will also refer you for a sleep study 6)  i am glad that your bP is good, and pls keep appt at headache center. Prescriptions: PHENTERMINE HCL 37.5 MG TABS (PHENTERMINE HCL) Take 1 tablet by mouth once a  day  #30 x 1   Entered and Authorized by:   Syliva Overman MD   Signed by:   Syliva Overman MD on 08/13/2009   Method used:   Printed then faxed to ...       [facility]* (retail)       [address]       [address]       [place], [place]  [address]       Ph: [phone]       Fax: [phone]   RxID:   [id number] MAXALT-MLT 10 MG TBDP (RIZATRIPTAN BENZOATE) one at headache onset may repeat after 2 hrs if needed , maximum of 2 tablets in 24 hours, max twice weekly  #8 x 3   Entered by:   Everitt Amber LPN   Authorized by:   Syliva Overman MD   Signed by:   Everitt Amber LPN on 25/95/6387   Method used:   Electronically to        [facility]  [address] [address] [facility] (retail)       [address]  [place], [place]  [address]       Ph: [phone]       Fax: [phone]   RxID:   [id number]

## 2010-04-08 NOTE — Assessment & Plan Note (Signed)
Summary: office visit   Vital Signs:  Patient profile:   45 year old female Menstrual status:  perimenopausal Height:      62.5 inches Weight:      172.75 pounds BMI:     31.21 O2 Sat:      97 % Pulse rate:   85 / minute Pulse rhythm:   regular Resp:     16 per minute BP sitting:   112 / 80  (left arm) Cuff size:   regular  Vitals Entered By: Everitt Amber LPN (October 15, 2009 4:11 PM)  Nutrition Counseling: Patient's BMI is greater than 25 and therefore counseled on weight management options. CC: Follow up chronic problems   CC:  Follow up chronic problems.  History of Present Illness: Reports  that she is recently doing better than she had been. Denies recent fever or chills. Denies sinus pressure, nasal congestion , ear pain or sore throat. Denies chest congestion, or cough productive of sputum. Denies chest pain, palpitations, PND, orthopnea or leg swelling. Denies abdominal pain, nausea, vomitting, diarrhea or constipation. Denies change in bowel movements or bloody stool. Denies dysuria , frequency, incontinence or hesitancy. Denies  joint pain, swelling, or reduced mobility. Denies headaches, vertigo, seizures.She has been to the headache clinic and with her current treatment her symptoms have improved greatly. for the first 6 months of the year, she had been experiencing depression and social withdrawalwith the deterioration in her marriage.She staes she has now recovered from this and is ready to move on. Denies  rash, lesions, or itch. She has not lost weight, but with increased attention to exercise and control in her eating she anticipates a change.     Current Medications (verified): 1)  Wellbutrin Sr 150 Mg  Tb12 (Bupropion Hcl) .... Take One Tablet By Mouth Once A Day 2)  Gabapentin 800 Mg  Tabs (Gabapentin) .... Take One Tablet By Mouth Three Times A Day 3)  Venlafaxine Hcl 75 Mg  Tabs (Venlafaxine Hcl) .... Take 1 Tablet By Mouth Once A Day 4)  Benicar Hct  20-12.5 Mg Tabs (Olmesartan Medoxomil-Hctz) .... Take 1 Tablet By Mouth Once A Day 5)  Flexeril 10 Mg Tabs (Cyclobenzaprine Hcl) .... Take 1 Tab By Mouth At Bedtime  As Needed 6)  Phentermine Hcl 37.5 Mg Tabs (Phentermine Hcl) .... Take 1 Tablet By Mouth Once A Day 7)  Naproxen Sodium 550 Mg Tabs (Naproxen Sodium) .... One Tab Every 12 Hrs As Needed 8)  Skelaxin 800 Mg Tabs (Metaxalone) .... One Tab 3-4 Times Daily As Needed  Allergies (verified): No Known Drug Allergies  Past History:  Past medical, surgical, family and social histories (including risk factors) reviewed, and no changes noted (except as noted below).  Past Medical History:   MIGRAINE HEADACHE (ICD-346.90) NICOTINE ADDICTION (ICD-305.1) depression ESSENTIAL HYPERTENSION, BENIGN (ICD-401.1) obesity  Past Surgical History: Reviewed history from 07/19/2007 and no changes required. Nasal Polypectomy 1990 Carpal tunnel release right and left 2001-2002 Right ankle surgery 2002  Family History: Reviewed history from 02/24/2007 and no changes required. Mom 43 healthy Dad 75 increased lipdema no brothers  no sisters  Social History: Reviewed history from 02/24/2007 and no changes required. 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 Eyes:  Denies discharge and eye pain. Endo:  Denies cold intolerance, excessive hunger, excessive thirst, excessive urination, heat intolerance, polyuria, and weight  change. Heme:  Denies abnormal bruising and bleeding. Allergy:  Denies hives or rash and itching eyes.  Physical Exam  General:  Well-developed,well-nourished,in no acute distress; alert,appropriate and cooperative throughout examination HEENT: No facial asymmetry,  EOMI, No sinus tenderness, TM's Clear, oropharynx  pink and moist.   Chest: Clear to auscultation bilaterally.  CVS: S1, S2, No  murmurs, No S3.   Abd: Soft, Nontender.  MS: Adequate ROM spine, hips, shoulders and knees.  Ext: No edema.   CNS: CN 2-12 intact, power tone and sensation normal throughout.   Skin: Intact, no visible lesions or rashes.  Psych: Good eye contact, normal affect.  Memory intact, not anxious or depressed appearing.    Impression & Recommendations:  Problem # 1:  CONTRACEPTIVE MANAGEMENT (ICD-V25.09) Assessment Comment Only  Orders: Gynecologic Referral (Gyn), pt referred for IUD placement  Problem # 2:  OBESITY (ICD-278.00) Assessment: Unchanged  Ht: 62.5 (10/15/2009)   Wt: 172.75 (10/15/2009)   BMI: 31.21 (10/15/2009)  Problem # 3:  NICOTINE ADDICTION (ICD-305.1) Assessment: Deteriorated  Encouraged smoking cessation and discussed different methods for smoking cessation.   Problem # 4:  ESSENTIAL HYPERTENSION, BENIGN (ICD-401.1) Assessment: Unchanged  Her updated medication list for this problem includes:    Benicar Hct 20-12.5 Mg Tabs (Olmesartan medoxomil-hctz) .Marland Kitchen... Take 1 tablet by mouth once a day  BP today: 112/80 Prior BP: 114/78 (08/13/2009)  Labs Reviewed: K+: 4.2 (10/09/2009) Creat: : 0.63 (10/09/2009)   Chol: 184 (10/09/2009)   HDL: 64 (10/09/2009)   LDL: 103 (10/09/2009)   TG: 84 (10/09/2009)  Problem # 5:  MIGRAINE HEADACHE (ICD-346.90) Assessment: Improved  The following medications were removed from the medication list:    Maxalt-mlt 10 Mg Tbdp (Rizatriptan benzoate) ..... One at headache onset may repeat after 2 hrs if needed , maximum of 2 tablets in 24 hours, max twice weekly Her updated medication list for this problem includes:    Naproxen Sodium 550 Mg Tabs (Naproxen sodium) ..... One tab every 12 hrs as needed  Problem # 6:  DEPRESSION (ICD-311) Assessment: Improved  Her updated medication list for this problem includes:    Wellbutrin Sr 150 Mg Tb12 (Bupropion hcl) .Marland Kitchen... Take one tablet by mouth once a day    Venlafaxine Hcl 75 Mg Tabs  (Venlafaxine hcl) .Marland Kitchen... Take 1 tablet by mouth once a day  Complete Medication List: 1)  Wellbutrin Sr 150 Mg Tb12 (Bupropion hcl) .... Take one tablet by mouth once a day 2)  Gabapentin 800 Mg Tabs (Gabapentin) .... Take one tablet by mouth three times a day 3)  Venlafaxine Hcl 75 Mg Tabs (Venlafaxine hcl) .... Take 1 tablet by mouth once a day 4)  Benicar Hct 20-12.5 Mg Tabs (Olmesartan medoxomil-hctz) .... Take 1 tablet by mouth once a day 5)  Naproxen Sodium 550 Mg Tabs (Naproxen sodium) .... One tab every 12 hrs as needed 6)  Skelaxin 800 Mg Tabs (Metaxalone) .... One tab 3-4 times daily as needed 7)  Phentermine Hcl 37.5 Mg Tabs (Phentermine hcl) .... Take 1 tablet by mouth once a day  Patient Instructions: 1)  Please schedule a follow-up appointment in 2 months. 2)  It is important that you exercise regularly at least 20 minutes 5 times a week. If you develop chest pain, have severe difficulty breathing, or feel very tired , stop exercising immediately and seek medical attention. 3)  You need to lose weight. Consider a lower calorie diet and regular exercise. 4)  You will be referred to  Dr Emelda Fear for IUD placement  5)  I am happy that you are doing better with the headaches Prescriptions: PHENTERMINE HCL 37.5 MG TABS (PHENTERMINE HCL) Take 1 tablet by mouth once a day  #30 x 1   Entered and Authorized by:   Syliva Overman MD   Signed by:   Syliva Overman MD on 10/15/2009   Method used:   Printed then faxed to ...       [facility]* (retail)       [address]       [address] Shipping/mailing       [place], [place]  [address]       Ph: [phone]       Fax: [phone]   RxID:   [id number] FLEXERIL 10 MG TABS (CYCLOBENZAPRINE HCL) Take 1 tab by mouth at bedtime  as needed  #90 x 1   Entered by:   Everitt Amber LPN   Authorized by:   Syliva Overman MD   Signed by:   Everitt Amber LPN on 56/43/3295   Method used:   Electronically to        [facility]* (retail)       [address]       [address] Shipping/mailing       [place], [place]  [address]       Ph: [phone]       Fax: [phone]   RxID:   [id number] BENICAR HCT 20-12.5 MG TABS (OLMESARTAN MEDOXOMIL-HCTZ) Take 1 tablet by mouth once a day  #90 x 1   Entered by:   Everitt Amber LPN   Authorized by:   Syliva Overman MD   Signed by:   Everitt Amber LPN on 15/17/6160   Method used:   Electronically to        [facility]* (retail)       [address]       [address] Shipping/mailing       [place], [place]  [address]       Ph: [phone]       Fax: [phone]   RxID:   [id number] VENLAFAXINE HCL 75 MG  TABS (VENLAFAXINE HCL) Take 1 tablet by mouth once a day  #90 x 1   Entered by:   Everitt Amber LPN   Authorized by:   Syliva Overman MD   Signed by:   Everitt Amber LPN on 01/75/1025   Method used:   Electronically to        [facility]* (retail)       [address]       [address] Shipping/mailing       [place], [place]  [address]       Ph: [phone]       Fax: [phone]   RxID:   [id number] GABAPENTIN 800 MG  TABS (GABAPENTIN) Take one tablet by mouth three times a day  #270 x 1   Entered by:   Everitt Amber LPN   Authorized by:   Syliva Overman MD   Signed by:   Everitt Amber LPN on 99/83/3825   Method used:   Electronically to        [facility]* (retail)       [address]       [address] Shipping/mailing       [place], [place]  [address]       Ph: [phone]       Fax: [phone]   RxID:   [id number] WELLBUTRIN SR 150 MG  TB12 (BUPROPION HCL) Take one tablet by mouth once a day  #90 x 1   Entered by:   Everitt Amber LPN   Authorized by:   Syliva Overman MD   Signed by:   Everitt Amber LPN on 10/93/2355   Method used:   Electronically to        [facility]* (retail)       [address]       [address]  Shipping/mailing       [place], [place]  [address]       Ph: [phone]       Fax: [phone]   RxID:   [id number]

## 2010-04-08 NOTE — Progress Notes (Signed)
Summary: refills  Phone Note Call from Patient   Summary of Call: pt rx refills have not been sent to [place] [facility] yet. She was in a week ago. can someone please call her rxs in. [phone] Initial call taken by: Rudene Anda,  June 19, 2009 3:16 PM  Follow-up for Phone Call        Phone Call Completed, Rx Called In Follow-up by: Adella Hare LPN,  June 19, 2009 4:29 PM    Prescriptions: FLEXERIL 10 MG TABS (CYCLOBENZAPRINE HCL) Take 1 tab by mouth at bedtime  as needed  #90 x 0   Entered by:   Adella Hare LPN   Authorized by:   Syliva Overman MD   Signed by:   Adella Hare LPN on 45/40/9811   Method used:   Electronically to        [facility]* (retail)       [address]       [address] Shipping/mailing       [place], [place]  [address]       Ph: [phone]       Fax: [phone]   RxID:   [id number] BENICAR HCT 20-12.5 MG TABS (OLMESARTAN MEDOXOMIL-HCTZ) Take 1 tablet by mouth once a day  #90 x 0   Entered by:   Adella Hare LPN   Authorized by:   Syliva Overman MD   Signed by:   Adella Hare LPN on 66/44/0347   Method used:   Electronically to        [facility]* (retail)       [address]       [address] Shipping/mailing       [place], [place]  [address]       Ph: [phone]       Fax: [phone]   RxID:   [id number] VENLAFAXINE HCL 75 MG  TABS (VENLAFAXINE HCL) Take 1 tablet by mouth once a day  #90 x 0   Entered by:   Adella Hare LPN   Authorized by:   Syliva Overman MD   Signed by:   Adella Hare LPN on 22/04/5425   Method used:   Electronically to        [facility]* (retail)       [address]       [address] Shipping/mailing       [place], [place]  [address]       Ph: [phone]       Fax: [phone]   RxID:   [id number] GABAPENTIN 800 MG  TABS (GABAPENTIN) Take one tablet by mouth three times a day  #270 x 0   Entered by:   Adella Hare LPN  Authorized by:   Syliva Overman MD   Signed by:   Adella Hare LPN on 18/29/9371   Method used:   Electronically to        [facility]* (retail)       [address]       [address] Shipping/mailing       [place], [place]  [address]       Ph: [phone]       Fax: [phone]   RxID:   [id number] WELLBUTRIN SR 150 MG  TB12 (BUPROPION HCL) Take one tablet by mouth once a day  #90 x 0   Entered by:   Adella Hare LPN  Authorized by:   Syliva Overman MD   Signed by:   Adella Hare LPN on 16/12/9602   Method used:   Electronically to        [facility]* (retail)       [address]       [address] Shipping/mailing       [place], [place]  [address]       Ph: [phone]       Fax: [phone]   RxID:   [id number]

## 2010-07-09 ENCOUNTER — Encounter: Payer: Self-pay | Admitting: Family Medicine

## 2010-07-09 ENCOUNTER — Ambulatory Visit (INDEPENDENT_AMBULATORY_CARE_PROVIDER_SITE_OTHER): Payer: Commercial Managed Care - PPO | Admitting: Family Medicine

## 2010-07-09 VITALS — BP 140/82 | HR 68 | Resp 14 | Ht 62.0 in | Wt 166.4 lb

## 2010-07-09 DIAGNOSIS — Z13220 Encounter for screening for lipoid disorders: Secondary | ICD-10-CM

## 2010-07-09 DIAGNOSIS — F329 Major depressive disorder, single episode, unspecified: Secondary | ICD-10-CM

## 2010-07-09 DIAGNOSIS — L0291 Cutaneous abscess, unspecified: Secondary | ICD-10-CM

## 2010-07-09 DIAGNOSIS — F17200 Nicotine dependence, unspecified, uncomplicated: Secondary | ICD-10-CM

## 2010-07-09 DIAGNOSIS — L0390 Cellulitis, unspecified: Secondary | ICD-10-CM | POA: Insufficient documentation

## 2010-07-09 DIAGNOSIS — I10 Essential (primary) hypertension: Secondary | ICD-10-CM

## 2010-07-09 DIAGNOSIS — E669 Obesity, unspecified: Secondary | ICD-10-CM

## 2010-07-09 MED ORDER — PHENTERMINE HCL 37.5 MG PO TABS: 37.5000 mg | ORAL_TABLET | Freq: Every day | ORAL | Status: DC

## 2010-07-09 MED ORDER — POTASSIUM CHLORIDE ER 10 MEQ PO CPCR: 10.0000 meq | ORAL_CAPSULE | Freq: Two times a day (BID) | ORAL | Status: DC

## 2010-07-09 MED ORDER — DOXYCYCLINE HYCLATE 100 MG PO TABS: 100.0000 mg | ORAL_TABLET | Freq: Two times a day (BID) | ORAL | Status: AC

## 2010-07-09 MED ORDER — HYDROCHLOROTHIAZIDE 12.5 MG PO TABS: 12.5000 mg | ORAL_TABLET | Freq: Every day | ORAL | Status: DC

## 2010-07-09 NOTE — Progress Notes (Signed)
  Subjective:    Patient ID: Zoe Bailey, female    DOB: [birth year], 45 y.o.   MRN: [id number]  HPI Pt noted purulent drainage from area on right leg which had been biopsied 1 month ago for skin cancer. Biopsy showed benign disease.. 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  Wants to resume phentermine.  Currently in smoking cessation class, wants to quit. Zoe Bailey has been reducing her caloric intake and is more active and happy and has lost weight, she is requesting that phentermine be resumed to assist with this.   Review of Systems Denies recent fever or chills. Denies sinus pressure, nasal congestion, ear pain or sore throat. Denies chest congestion, productive cough or wheezing. Denies chest pains, palpitations, paroxysmal nocturnal dyspnea, orthopnea and leg swelling Denies abdominal pain, nausea, vomiting,diarrhea or constipation.  Denies rectal bleeding or change in bowel movement. Denies dysuria, frequency, hesitancy or incontinence. Denies joint pain, swelling and limitation in mobility. Denies headaches, seizure, numbness, or tingling. Denies uncontrolled  depression, anxiety or insomnia.Her medications are working well         Objective:   Physical Exam Patient alert and oriented and in no Cardiopulmonary distress.  HEENT: No facial asymmetry, EOMI, no sinus tenderness, TM's clear, Oropharynx pink and moist.  Neck supple no adenopathy.  Chest: Clear to auscultation bilaterally.  CVS: S1, S2 no murmurs, no S3.  ABD: Soft non tender. Bowel sounds normal.  Ext: No edema  MS: Adequate ROM spine, shoulders, hips and knees.  Skin:Erythematous lesion which is open on right leg with scant drainage.  Psych: Good eye contact, normal affect. Memory intact not anxious or depressed appearing.  CNS: CN 2-12 intact, power, tone and sensation normal throughout.        Assessment & Plan:

## 2010-07-09 NOTE — Patient Instructions (Addendum)
CPE in 3 months.  Medication is sent in for your infection on the leg.  You can resume the phentermine, I will refill x 3 months  Fasting chem 7 and lipids in 3 months

## 2010-07-12 NOTE — Assessment & Plan Note (Signed)
Improved. Pt applauded on succesful weight loss through lifestyle change, and encouraged to continue same. Weight loss goal set for the next several months.  

## 2010-07-12 NOTE — Assessment & Plan Note (Signed)
Markedly improved, is involved in foster care which she finds extremelyrewarding

## 2010-07-12 NOTE — Assessment & Plan Note (Signed)
Scant purulent drainage, area cleaned with saline and pt to take antibiotic course

## 2010-07-12 NOTE — Assessment & Plan Note (Signed)
Pt currently in smoking cessation class, plans to quit applauded on this decision

## 2010-07-12 NOTE — Assessment & Plan Note (Signed)
Uncontrolled , no med change at this time, focus on lifestyle change only

## 2010-07-22 NOTE — H&P (Signed)
Zoe Bailey, Zoe Bailey                 ACCOUNT NO.:  [id number]   MEDICAL RECORD NO.:  [id number]          PATIENT TYPE:  AMB   LOCATION:  DAY                           FACILITY:  [facility]   PHYSICIAN:  Tilford Pillar, MD      DATE OF BIRTH:  [birth year]   DATE OF ADMISSION:  DATE OF DISCHARGE:  LH                              HISTORY & PHYSICAL   CHIEF COMPLAINT:  Umbilical hernia.   HISTORY OF PRESENT ILLNESS:  The patient is a 45 year old female who  presents to my office with increasing discomfort in the periumbilical  region.  She denies any notable bulging in this area, but states that  she was told she had a hernia when she was child in this area which has  never been fixed.  She has always had some intermittent episodes of  discomfort in this area but has noted increasing discomfort associated  with the periumbilical region.  She has had some associated nausea with  increasing discomfort with this but no episodes of emesis.  She has had  no fever or chills.  No signs or symptoms consistent with incarceration  including overlying erythema or sharp constant pain, fevers, chills.   PAST MEDICAL HISTORY:  1. Depression.  2. Migraines.  3. Hypertension.   PAST SURGICAL HISTORY:  Carpal tunnel.   MEDICATIONS:  1. Effexor.  2. Wellbutrin.  3. Neurontin.  4. Benicar.   ALLERGIES:  No known drug allergies.   SOCIAL HISTORY:  No tobacco, no alcohol use.  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   PERTINENT FAMILY HISTORY:  Noncontributory.   REVIEW OF SYSTEMS:  CONSTITUTIONAL:  Unremarkable.  EYES:  Unremarkable.  EARS, NOSE, AND THROAT:  Unremarkable.  RESPIRATORY:  Unremarkable.  CARDIOVASCULAR:  Unremarkable.  GASTROINTESTINAL:  Abdominal pain,  nausea, vomiting, and indigestion.  Otherwise, as per HPI.  GENITOURINARY:  Unremarkable.  MUSCULOSKELETAL:  Arthralgias of the neck  and back.  SKIN:  Unremarkable.  ENDOCRINE:  Unremarkable.  NEURO:  Unremarkable.   PHYSICAL EXAMINATION:  GENERAL:  The patient is somewhat obese, calm and  on presentation, she is alert and oriented x3.  She is not in any acute  distress.  HEENT:  Scalp, no deformities.  No masses.  Eyes, pupils equal, round,  and reactive to light.  Extraocular movements are intact.  No scleral  icterus or conjunctival pallor is noted.  Oral mucosa is pink.  No  malocclusion.  NECK:  Trachea is midline.  No cervical lymphadenopathy.  PULMONARY:  Unlabored respiration.  No wheezes.  No crackles.  She is  clear to auscultation bilaterally.  CARDIOVASCULAR:  Regular rate and rhythm.  No murmurs or gallops are  appreciated today on evaluation.  Pulses are 2+ radial and dorsalis  pedis pulses bilaterally.  ABDOMEN:  Positive bowel sounds.  Abdomen is soft.  She has mild  discomfort again in the periumbilical region.  No peritoneal signs are  elicited.  No rebound.  She does have a small umbilical hernia defect  slightly inferior to her umbilicus.  No masses were  appreciated.  SKIN:  Warm and dry.   ASSESSMENT/PLAN:  Small umbilical hernia.  At this point, I did have a  discussion with the patient in regards to potential repair.  I discussed  with the patient the pathophysiology of a hernia and discussed with the  patient potential techniques including anterior approach versus  laparoscopic approach.  Due to the small size of this, I did emphasis  this could be something very minimal to a anterior approach.  The  possible occlusion of mesh was discussed with the patient.  The risks,  benefits, and alternatives of this was discussed at length with the  patient including but not limited to risk of bleeding, infection, mesh  infection requiring the removal of the mesh as well as possibility of  recurrence.  The patient's questions and concerns are addressed  and the  patient will be consented for the planned anterior repair of the small  umbilical hernia.  In addition, signs and symptoms of  incarceration and  strangulation were discussed with the patient, and she was instructed to  proceed to the emergency department should she develop any symptoms.      Tilford Pillar, MD  Electronically Signed     BZ/MEDQ  D:  11/08/2007  T:  11/09/2007  Job:  [id number]   cc:   Milus Mallick. Lodema Hong, M.D.  Fax: [phone]

## 2010-07-22 NOTE — Op Note (Signed)
Zoe Bailey, NITTA                 ACCOUNT NO.:  [id number]   MEDICAL RECORD NO.:  [id number]          PATIENT TYPE:  AMB   LOCATION:  DAY                           FACILITY:  [facility]   PHYSICIAN:  Tilford Pillar, MD      DATE OF BIRTH:  [birth year]   DATE OF PROCEDURE:  11/11/2007  DATE OF DISCHARGE:                               OPERATIVE REPORT   PREOPERATIVE DIAGNOSIS:  Reducible umbilical hernia.   POSTOPERATIVE DIAGNOSIS:  Reducible umbilical hernia.   PROCEDURE:  Open umbilical hernia repair with primary closure (no mesh  closure).   SURGEON:  Tilford Pillar, MD   ANESTHESIA:  General laryngeal mask airway, local anesthetic 0.5%  Sensorcaine plain.   SPECIMEN:  None.   ESTIMATED BLOOD LOSS:  Scant.   INDICATIONS:  The patient is a 45 year old female who presented to my  office with a history of a noted bulge and discomfort around her  umbilicus.  This had been actually present since her childhood and had  never given her any trouble except for the last several months where she  has had increasing discomfort and notable increase in the size of the  bulge.  On evaluation, she did have a reducible umbilical hernia.  Risks, benefits, and alternatives of a open umbilical hernia repair  versus laparoscopic umbilical hernia repair were discussed at length  with the patient including the potential requirement of mesh closure.  The patient's questions and concerns were addressed and the patient was  consented for the planned procedure.   OPERATION:  The patient was taken to the operating room and was placed  in the supine position on the operating table.  At which time, the  general anesthetic was administered once the patient was asleep a  laryngeal mask airway was placed by anesthesia.  Her abdomen was then  prepped with DuraPrep solution and draped in standard fashion.  A linear  incision was created with the scalpel, this dissection down to the  subcuticular tissue and was  carried out using electrocautery down to the  anterior abdominal fascia.  At this point, the umbilical stalk was  dissected around using a hemostat and then the umbilical stalk was  dissected free from the hernia sac, let the umbilical stalk free.  The  hernia stalk was reduced back into the abdominal cavity and the  surrounding fascia was scored circumferentially.  The defect was quite  small approximately 1 cm in size and based on this, I opted not to place  a piece of mesh for the closure.  Using 0 Ethibond sutures in simple  interrupted fashion, the fascial defect was reapproximated.  I was quite  pleased with the appearance of the closure.  The wound was irrigated.  Hemostasis was obtained with electrocautery.  The wound was irrigated  with sterile saline and then local anesthetic was instilled to the  fascia and into the subcuticular tissue.  A 3-0 Vicryl was then  utilized.  The packs of the umbilical stalk down to the anterior fascia  and the same 3-0 Vicryl was utilized to reapproximate the  deep  subcuticular tissue.  Skin edges were reapproximated using a 4-0  Monocryl and a running subcuticular suture.  The skin was washed and  dried with moist dry towel.  Benzoin was applied around the incision.  Half-inch Steri-Strips were placed.  The drapes were removed.  The  patient was allowed to come out of the general anesthetic and she was  transferred back to regular hospital, transferred to the Postanesthetic  Care Unit in stable condition.  At the conclusion of procedure, all  instrument, sponge, and needle counts were correct.  The patient  tolerated the procedure well.      Tilford Pillar, MD  Electronically Signed     BZ/MEDQ  D:  11/11/2007  T:  11/11/2007  Job:  [id number]   cc:   Milus Mallick. Lodema Hong, M.D.  Fax: [phone]

## 2010-07-25 NOTE — Procedures (Signed)
NAMEMARGRETTA, ZAMORANO                 ACCOUNT NO.:  [id number]   MEDICAL RECORD NO.:  [id number]          PATIENT TYPE:  OUT   LOCATION:                                FACILITY:  [facility]   PHYSICIAN:  Edward L. Juanetta Gosling, M.D.DATE OF BIRTH:  22-Mar-1965   DATE OF PROCEDURE:  DATE OF DISCHARGE:                              PULMONARY FUNCTION TEST   1.  Spirometry shows normal flows.  2.  Lung volumes are normal.  3.  DLCO is mildly reduced.  4.  Arterial blood gases are normal.      Edward L. Juanetta Gosling, M.D.  Electronically Signed     ELH/MEDQ  D:  07/06/2005  T:  07/07/2005  Job:  [id number]   cc:   Milus Mallick. Lodema Hong, M.D.  Fax: [phone]

## 2010-07-25 NOTE — Op Note (Signed)
NAMEAIREANA, Zoe Bailey                 ACCOUNT NO.:  [id number]   MEDICAL RECORD NO.:  [id number]          PATIENT TYPE:  AMB   LOCATION:  DAY                           FACILITY:  [facility]   PHYSICIAN:  Vickki Hearing, M.D.DATE OF BIRTH:  12-Oct-1965   DATE OF PROCEDURE:  04/21/2005  DATE OF DISCHARGE:                                 OPERATIVE REPORT   PREOPERATIVE DIAGNOSIS:  Right carpal tunnel syndrome, recurrent.   POSTOPERATIVE DIAGNOSIS:  Right carpal tunnel syndrome, recurrent.   PROCEDURE:  Right carpal tunnel release.   SURGEON:  Dr.  Romeo Apple.   ASSISTANT:  None.   ANESTHETIC:  Regional block.   INDICATIONS FOR PROCEDURE:  Recurrent carpal tunnel syndrome.   OPERATIVE FINDINGS:  Compressed, discolored median nerve. We made a specific  dissection of the thenar branch of the median nerve, and it was found to be  slightly compressed. There was scarring of the median nerve to the overlying  transverse carpal ligament which had essentially blended into the palmar  fascia. There was no real distinct layer there.   NOTE:  Surgery was done as follows:  The patient was seen in preoperative  holding area. Preoperative procedure was completed, including marking the  surgical site by the patient and the surgeon and then starting antibiotics  and updating the history and physical. The patient was eventually taken to  the operating room for Bier block. Once that was established successfully,  the right upper extremity was prepped and draped in sterile technique using  DuraPrep, and after prepping and draping, we completed the time-out  procedure. All agreed on the procedure as right carpal tunnel release. I  made an incision over the previous carpal tunnel incision, bluntly dissected  down to the fascia, found the distal aspect of the transverse carpal  ligament with blunt dissection, dissected beneath the transverse carpal  ligament and released the nerve from the adhesions to the  ligament. We then  divided the transverse carpal ligament sharply and included the flexor  retinaculum distally.   We then dissected out the thenar branch to the thenar muscles and found some  mild compression there as well.   We irrigated and closed with 3-0 nylon, injected with 10 cc of Marcaine  plain, added a sterile dressing, released the tourniquet. Fingers had good  capillary refill and color. She was taken to the recovery room in stable  condition. She discharged on Lorcet Plus with 2 refills 1 every 4 hours #60,  and follow-up will be on Thursday for dressing change.      Vickki Hearing, M.D.  Electronically Signed     SEH/MEDQ  D:  04/21/2005  T:  04/21/2005  Job:  [id number]

## 2010-07-25 NOTE — H&P (Signed)
Zoe Bailey, Zoe Bailey                 ACCOUNT NO.:  [id number]   MEDICAL RECORD NO.:  [id number]          PATIENT TYPE:  AMB   LOCATION:                                FACILITY:  [facility]   PHYSICIAN:  Vickki Hearing, M.D.DATE OF BIRTH:  07-Dec-1965   DATE OF ADMISSION:  DATE OF DISCHARGE:  LH                                HISTORY & PHYSICAL   CHIEF COMPLAINT:  Recurrent right carpal tunnel syndrome.   HISTORY:  This patient is status post previous right carpal tunnel release  2002, presented with new onset of symptoms of pain, numbness and tingling  associated with weakness of the right upper extremity. She indicates the  symptoms are exactly the same. Interestingly, the symptoms only involve the  right thumb and radiate up toward the right wrist and radial side of the  forearm. There are no symptoms in the lesser digits. She has had a new nerve  conduction study indicating mild to moderate carpal tunnel syndrome of the  right upper extremity.   REVIEW OF SYSTEMS:  Reflux, migraine headaches, depression, seasonal  allergies. Otherwise normal.   ALLERGIES:  None.   PAST MEDICAL HISTORY:  1.  Migraines.  2.  Depression.  3.  Hypertension.   PAST SURGICAL HISTORY:  1.  Nasal surgery.  2.  Ankle surgery.  3.  Right carpal tunnel release 2002.   PHARMACY:  [facility] in [place], [phone].   MEDICATIONS:  Effexor XR, Wellbutrin XL, Kariva, gabapentin, benazepril,  hydrochlorothiazide.   FAMILY HISTORY:  Arthritis, asthma.   FAMILY PHYSICIAN:  Dr. Syliva Overman.   SOCIAL HISTORY:  She is single, Buyer, retail at [employer]. Smoking, one pack a day. Alcohol use, none. Caffeine use, one  cup a day. Highest grade completed, associate's degree.   PHYSICAL EXAMINATION:  VITAL SIGNS:  Weight 170, pulse 82, respiratory rate  18.  GENERAL APPEARANCE:  She is normally developed, well nourished. Grooming and  hygiene are normal. Body habitus  mesomorphic.  CARDIOVASCULAR:  Pulses are good.  EXTREMITIES:  Warm. No edema. No venous stasis.  LYMPHATIC:  Nodes are normal in the upper extremity.  MUSCULOSKELETAL:  Gait and station are normal. Right upper extremity has a  tender area over the thenar eminence. There is a negative grind test,  negative Finkelstein's test. Range of motion is normal. Right and left grip  evaluation shows decreased grip strength on the right. Wrist joint is  stable. Lower extremities are aligned properly without contractions,  subluxation, atrophy or tremor.  SKIN:  Normal. Incision in the palm is healed and is almost nonvisible.  NEUROPSYCHOLOGICAL:  Alert and oriented x3. Mood normal. She has normal  sensation to soft touch and pinprick. Good coordination and reflexes. She  has pain in the thumb. No reproducible sensory deficit. Her thenar pain is  right in the middle of the thenar eminence where the recurrent branch of the  median nerve motor branch normally lies.   MEDICAL DECISION MAKING DATA REVIEWED:  Radiographs of the hand showed no  basilar joint arthritis and normal findings.  Other data include nerve  conduction study indicating carpal tunnel syndrome.   DIAGNOSIS:  Carpal tunnel syndrome, pain over the thenar branch which is a  motor branch with possible entrapment of the motor branch although there is  no atrophy and no numbness. Diagnosis is carpal tunnel syndrome, right  wrist/hand.   PLAN:  Open carpal tunnel release. Code 64721/354.0.      Vickki Hearing, M.D.  Electronically Signed     SEH/MEDQ  D:  04/17/2005  T:  04/17/2005  Job:  [id number]   cc:   [facility]  Fax: [phone]

## 2010-10-13 ENCOUNTER — Encounter: Payer: Self-pay | Admitting: Family Medicine

## 2010-10-14 ENCOUNTER — Encounter: Payer: Commercial Managed Care - PPO | Admitting: Family Medicine

## 2010-10-14 ENCOUNTER — Telehealth: Payer: Self-pay | Admitting: Family Medicine

## 2010-10-14 MED ORDER — BUPROPION HCL ER (SR) 150 MG PO TB12: 150.0000 mg | ORAL_TABLET | Freq: Every day | ORAL | Status: DC

## 2010-10-14 NOTE — Telephone Encounter (Signed)
Refill sent.

## 2010-12-10 ENCOUNTER — Other Ambulatory Visit: Payer: Self-pay | Admitting: Family Medicine

## 2010-12-10 DIAGNOSIS — Z139 Encounter for screening, unspecified: Secondary | ICD-10-CM

## 2011-01-09 ENCOUNTER — Ambulatory Visit (HOSPITAL_COMMUNITY)
Admission: RE | Admit: 2011-01-09 | Discharge: 2011-01-09 | Disposition: A | Discharge status: Home / Self Care | Payer: 59 | Source: Ambulatory Visit | Attending: Family Medicine | Admitting: Family Medicine

## 2011-01-09 DIAGNOSIS — Z1231 Encounter for screening mammogram for malignant neoplasm of breast: Secondary | ICD-10-CM | POA: Insufficient documentation

## 2011-01-09 DIAGNOSIS — Z139 Encounter for screening, unspecified: Secondary | ICD-10-CM

## 2011-01-14 ENCOUNTER — Other Ambulatory Visit: Payer: Self-pay | Admitting: Family Medicine

## 2011-01-14 ENCOUNTER — Telehealth: Payer: Self-pay | Admitting: Family Medicine

## 2011-01-14 DIAGNOSIS — Z139 Encounter for screening, unspecified: Secondary | ICD-10-CM

## 2011-01-14 DIAGNOSIS — R921 Mammographic calcification found on diagnostic imaging of breast: Secondary | ICD-10-CM

## 2011-01-14 DIAGNOSIS — Z09 Encounter for follow-up examination after completed treatment for conditions other than malignant neoplasm: Secondary | ICD-10-CM

## 2011-01-14 NOTE — Telephone Encounter (Signed)
I personally notified pt of need for further imaging of breast , she was unaware and will call for appt

## 2011-01-16 ENCOUNTER — Ambulatory Visit (HOSPITAL_COMMUNITY): Payer: 59

## 2011-01-21 ENCOUNTER — Ambulatory Visit (HOSPITAL_COMMUNITY)
Admission: RE | Admit: 2011-01-21 | Discharge: 2011-01-21 | Disposition: A | Discharge status: Home / Self Care | Payer: 59 | Source: Ambulatory Visit | Attending: Family Medicine | Admitting: Family Medicine

## 2011-01-21 DIAGNOSIS — R921 Mammographic calcification found on diagnostic imaging of breast: Secondary | ICD-10-CM

## 2011-01-21 DIAGNOSIS — R928 Other abnormal and inconclusive findings on diagnostic imaging of breast: Secondary | ICD-10-CM | POA: Insufficient documentation

## 2011-02-19 ENCOUNTER — Encounter: Payer: Self-pay | Admitting: Orthopedic Surgery

## 2011-02-19 ENCOUNTER — Ambulatory Visit (INDEPENDENT_AMBULATORY_CARE_PROVIDER_SITE_OTHER): Payer: 59 | Admitting: Orthopedic Surgery

## 2011-02-19 VITALS — BP 128/80 | Ht 62.0 in | Wt 171.0 lb

## 2011-02-19 DIAGNOSIS — S66811A Strain of other specified muscles, fascia and tendons at wrist and hand level, right hand, initial encounter: Secondary | ICD-10-CM

## 2011-02-19 DIAGNOSIS — IMO0002 Reserved for concepts with insufficient information to code with codable children: Secondary | ICD-10-CM

## 2011-02-19 DIAGNOSIS — M19049 Primary osteoarthritis, unspecified hand: Secondary | ICD-10-CM

## 2011-02-19 DIAGNOSIS — M24139 Other articular cartilage disorders, unspecified wrist: Secondary | ICD-10-CM

## 2011-02-19 DIAGNOSIS — S63659A Sprain of metacarpophalangeal joint of unspecified finger, initial encounter: Secondary | ICD-10-CM

## 2011-02-19 NOTE — Progress Notes (Signed)
Patient ID: Zoe Bailey, female   DOB: 09-12-1965, 45 y.o.   MRN: [id number] Chief complaint RIGHT thumb pain  Patient is status post carpal tunnel release RIGHT hand twice once in 19 [id number] 2007.  I did the second surgery  She did well  She now reports diffuse pain around the wrist including the base of the thumb and the ulnar side of the wrist with pain dorsally and volarly in the mid carpal region.  No trauma.  She reports some tingling in the base of the thumb but not in the other digits.  Pain feels different than the carpal tunnel symptoms which she is very familiar with.  She did take some ibuprofen and takes that when needed.  She also worked to brace his one was a compression-type brace and the other one was about to a carpal tunnel splint.  Review of systems as noted in her hand written questions responses.  Reviewed.  Exam shows tenderness over the ulnar side Of the wrist as well as the base of the thumb.  There is no swelling just tenderness.  She has pain with the grind maneuver and pain with ulnar deviation of the wrist.  Strength is normal she has normal passive range of motion.  No instability.  Gait station are normal  Overall appearance was normal  Pulse and perfusion are normal including radial and ulnar pulses with good color and capillary refill in the RIGHT hand.  Skin is intact  Incision is nontender.  No sensory deficits in the hand.  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  Recommended immobilization for 6 weeks, 2 weeks steroid Dosepak, redo ibuprofen followup in 6 weeks.  X-ray reports that the dictation.  AP lateral and oblique RIGHT wrist  Findings mild degenerative changes at the [place] joint.  Otherwise wrist and hand looks normal  Impression mild CMC arthritis

## 2011-02-19 NOTE — Patient Instructions (Addendum)
   Wear brace x 6 weeks   DIAGNOSIS:  ARTHRITIS THUMB INFLAMMATION TRIANGULAR FIBROCARTILAGE

## 2011-04-02 ENCOUNTER — Ambulatory Visit (INDEPENDENT_AMBULATORY_CARE_PROVIDER_SITE_OTHER): Payer: 59 | Admitting: Orthopedic Surgery

## 2011-04-02 ENCOUNTER — Encounter: Payer: Self-pay | Admitting: Orthopedic Surgery

## 2011-04-02 DIAGNOSIS — M24139 Other articular cartilage disorders, unspecified wrist: Secondary | ICD-10-CM

## 2011-04-02 DIAGNOSIS — S6980XA Other specified injuries of unspecified wrist, hand and finger(s), initial encounter: Secondary | ICD-10-CM | POA: Insufficient documentation

## 2011-04-02 DIAGNOSIS — M19049 Primary osteoarthritis, unspecified hand: Secondary | ICD-10-CM

## 2011-04-02 DIAGNOSIS — M189 Osteoarthritis of first carpometacarpal joint, unspecified: Secondary | ICD-10-CM | POA: Insufficient documentation

## 2011-04-02 NOTE — Patient Instructions (Signed)
Ok to remove brace   Taking orthoflex is ok

## 2011-04-02 NOTE — Progress Notes (Signed)
Patient ID: Zoe Bailey, female   DOB: Dec 27, 1965, 46 y.o.   MRN: [id number]  Diagnosis CMC arthritis, RIGHT thumb, possible TFCC tear.  Improve with bracing, and oral steroids.  Review of systems normal.  Exam general appearance normal. Vital signs as recorded in aspect normal. Full range of motion no tenderness. No swelling. No instability. Normal strength, RIGHT hand.  Symptoms resolve.  Followup as needed

## 2011-05-11 ENCOUNTER — Encounter: Payer: Self-pay | Admitting: Family Medicine

## 2011-05-11 ENCOUNTER — Ambulatory Visit (INDEPENDENT_AMBULATORY_CARE_PROVIDER_SITE_OTHER): Payer: 59 | Admitting: Family Medicine

## 2011-05-11 ENCOUNTER — Other Ambulatory Visit (HOSPITAL_COMMUNITY)
Admission: RE | Admit: 2011-05-11 | Discharge: 2011-05-11 | Disposition: A | Discharge status: Home / Self Care | Payer: 59 | Source: Ambulatory Visit | Attending: Family Medicine | Admitting: Family Medicine

## 2011-05-11 VITALS — BP 126/74 | HR 87 | Resp 18 | Ht 62.0 in | Wt 170.1 lb

## 2011-05-11 DIAGNOSIS — L989 Disorder of the skin and subcutaneous tissue, unspecified: Secondary | ICD-10-CM

## 2011-05-11 DIAGNOSIS — Z0000 Encounter for general adult medical examination without abnormal findings: Secondary | ICD-10-CM

## 2011-05-11 DIAGNOSIS — F17200 Nicotine dependence, unspecified, uncomplicated: Secondary | ICD-10-CM

## 2011-05-11 DIAGNOSIS — F3289 Other specified depressive episodes: Secondary | ICD-10-CM

## 2011-05-11 DIAGNOSIS — E669 Obesity, unspecified: Secondary | ICD-10-CM

## 2011-05-11 DIAGNOSIS — B351 Tinea unguium: Secondary | ICD-10-CM

## 2011-05-11 DIAGNOSIS — I10 Essential (primary) hypertension: Secondary | ICD-10-CM

## 2011-05-11 DIAGNOSIS — Z01419 Encounter for gynecological examination (general) (routine) without abnormal findings: Secondary | ICD-10-CM | POA: Insufficient documentation

## 2011-05-11 DIAGNOSIS — G43909 Migraine, unspecified, not intractable, without status migrainosus: Secondary | ICD-10-CM

## 2011-05-11 DIAGNOSIS — F329 Major depressive disorder, single episode, unspecified: Secondary | ICD-10-CM

## 2011-05-11 LAB — POC HEMOCCULT BLD/STL (OFFICE/1-CARD/DIAGNOSTIC): Fecal Occult Blood, POC: NEGATIVE

## 2011-05-11 MED ORDER — PHENTERMINE HCL 37.5 MG PO CAPS: 37.5000 mg | ORAL_CAPSULE | ORAL | Status: DC

## 2011-05-11 MED ORDER — GABAPENTIN 800 MG PO TABS: 800.0000 mg | ORAL_TABLET | Freq: Three times a day (TID) | ORAL | Status: DC

## 2011-05-11 MED ORDER — VENLAFAXINE HCL 75 MG PO TABS: 75.0000 mg | ORAL_TABLET | Freq: Every day | ORAL | Status: DC

## 2011-05-11 MED ORDER — CYCLOBENZAPRINE HCL 10 MG PO TABS: 10.0000 mg | ORAL_TABLET | Freq: Three times a day (TID) | ORAL | Status: DC | PRN

## 2011-05-11 NOTE — Progress Notes (Signed)
  Subjective:    Patient ID: MOLLI GETHERS, female    DOB: [birth year], 46 y.o.   MRN: [id number]  HPI The PT is here for annual exam and re-evaluation of chronic medical conditions, medication management and review of any available recent lab and radiology data.  Preventive health is updated, specifically  Cancer screening and Immunization.   Questions or concerns regarding consultations or procedures which the PT has had in the interim are  Addressed.She has had  much success from botox for migraines, and states there are ever really uncontrolled now for the week before her menses No longer takes blood pressure med daily, only when "stressed" and has learned to handle stress better. Smokes mainly at home, between 12 to 15 daily, intends to work on this. Ha joined [employer] with 7 pound weight loss since January, requests phentermine to help. Wants help for thickened toenails      Review of Systems See HPI Denies recent fever or chills. Denies sinus pressure, nasal congestion, ear pain or sore throat. Denies chest congestion, productive cough or wheezing. Denies chest pains, palpitations and leg swelling Denies abdominal pain, nausea, vomiting,diarrhea or constipation.   Denies dysuria, frequency, hesitancy or incontinence. Denies joint pain, swelling and limitation in mobility. Denies headaches, seizures, numbness, or tingling. Denies uncontrolled  depression, anxiety or insomnia. Denies skin break down or rash.        Objective:   Physical Exam Pleasant mildly obese female, alert and oriented x 3, in no cardio-pulmonary distress. Afebrile. HEENT No facial trauma or asymetry. Sinuses non tender.  EOMI, PERTL, fundoscopic exam is normal, no hemorhage or exudate.  External ears normal, tympanic membranes clear. Oropharynx moist, no exudate, good dentition. Neck: supple, no adenopathy,JVD or thyromegaly.No bruits.  Chest: Clear to ascultation bilaterally.No crackles or  wheezes. Non tender to palpation  Breast: No asymetry,no masses. No nipple discharge or inversion. No axillary or supraclavicular adenopathy  Cardiovascular system; Heart sounds normal,  S1 and  S2 ,no S3.  No murmur, or thrill. Apical beat not displaced Peripheral pulses normal.  Abdomen: Soft, non tender, no organomegaly or masses. No bruits. Bowel sounds normal. No guarding, tenderness or rebound.  Rectal:  No mass. Guaiac negative stool.  GU: External genitalia normal. No lesions. Vaginal canal normal.No discharge. Uterus normal size, no adnexal masses, no cervical motion or adnexal tenderness.  Musculoskeletal exam: Full ROM of spine, hips , shoulders and knees. No deformity ,swelling or crepitus noted. No muscle wasting or atrophy.   Neurologic: Cranial nerves 2 to 12 intact. Power, tone ,sensation and reflexes normal throughout. No disturbance in gait. No tremor.  Skin: Intact, no ulceration, multiple freckles, and red hair. High skin cancer risk, has had in the past. Red rough lesion on anterior chest, and similar area of concern on right leg , will refer to dermatology Psych; Normal mood and affect. Judgement and concentration normal        Assessment & Plan:

## 2011-05-11 NOTE — Assessment & Plan Note (Signed)
Controlled, no change in medication  

## 2011-05-11 NOTE — Assessment & Plan Note (Signed)
Improved. Pt applauded on succesful weight loss through lifestyle change, and encouraged to continue same. Weight loss goal set for the next several months.  

## 2011-05-11 NOTE — Assessment & Plan Note (Signed)
Unchanged, cessation counseling and options discussed

## 2011-05-11 NOTE — Assessment & Plan Note (Signed)
Will prescribe terbinafine once hepatic panel is available

## 2011-05-11 NOTE — Assessment & Plan Note (Signed)
Treated at headache clinic getting botox, and has on avg severe headache for 1 week before her cycle, uses gabapentin and skelaxin to abhort

## 2011-05-11 NOTE — Patient Instructions (Addendum)
F/u in 4 months   Fasting labs are past due, pls get asap. CBc, chem 7, lipid, hepatic, tsh and vitD, and HBA1C  You will be referred to dermatology about the lesions on anterior chest and leg   Plan to cut back on smoking as we discussed, you may even join up for free cesssation classes through [facility]. Congrats on weight loss, keep it up.  You will get tablets sent in for fungal toenail infections, after you have had your liver blood test

## 2011-05-12 ENCOUNTER — Other Ambulatory Visit: Payer: Self-pay | Admitting: Family Medicine

## 2011-05-12 MED ORDER — VENLAFAXINE HCL ER 75 MG PO CP24: 75.0000 mg | ORAL_CAPSULE | Freq: Every day | ORAL | Status: DC

## 2011-05-16 LAB — BASIC METABOLIC PANEL
BUN: 14 mg/dL (ref 6–23)
Chloride: 102 mEq/L (ref 96–112)
Glucose, Bld: 91 mg/dL (ref 70–99)
Potassium: 4.6 mEq/L (ref 3.5–5.3)

## 2011-05-16 LAB — HEMOGLOBIN A1C
Hgb A1c MFr Bld: 5.6 % (ref <5.7)
Mean Plasma Glucose: 114 mg/dL (ref <117)

## 2011-05-16 LAB — CBC
HCT: 42.3 % (ref 36.0–46.0)
Hemoglobin: 13.9 g/dL (ref 12.0–15.0)
RBC: 4.51 MIL/uL (ref 3.87–5.11)
WBC: 15.9 10*3/uL — ABNORMAL HIGH (ref 4.0–10.5)

## 2011-05-16 LAB — VITAMIN D 25 HYDROXY (VIT D DEFICIENCY, FRACTURES): Vit D, 25-Hydroxy: 31 ng/mL (ref 30–89)

## 2011-05-16 LAB — LIPID PANEL
LDL Cholesterol: 80 mg/dL (ref 0–99)
VLDL: 11 mg/dL (ref 0–40)

## 2011-05-16 LAB — HEPATIC FUNCTION PANEL
Bilirubin, Direct: 0.1 mg/dL (ref 0.0–0.3)
Indirect Bilirubin: 0.3 mg/dL (ref 0.0–0.9)

## 2011-05-28 ENCOUNTER — Telehealth: Payer: Self-pay | Admitting: Family Medicine

## 2011-05-28 NOTE — Telephone Encounter (Signed)
Called patient and left message for them to return call at the office   

## 2011-05-29 ENCOUNTER — Other Ambulatory Visit: Payer: Self-pay | Admitting: Family Medicine

## 2011-05-29 MED ORDER — TERBINAFINE HCL 250 MG PO TABS: 250.0000 mg | ORAL_TABLET | Freq: Every day | ORAL | Status: DC

## 2011-05-29 NOTE — Telephone Encounter (Signed)
Script is printed, please fax

## 2011-05-29 NOTE — Telephone Encounter (Signed)
Was calling about her lamisil for her toes. I advised hepatic was normal and we would be sending it in. Wants a note on it to ship to [facility] so print it please

## 2011-05-29 NOTE — Telephone Encounter (Signed)
Faxed and pt aware

## 2011-08-06 ENCOUNTER — Encounter (HOSPITAL_COMMUNITY): Payer: Self-pay | Admitting: *Deleted

## 2011-08-06 ENCOUNTER — Emergency Department (HOSPITAL_COMMUNITY)
Admission: EM | Admit: 2011-08-06 | Discharge: 2011-08-06 | Disposition: A | Discharge status: Home / Self Care | Payer: 59 | Source: Home / Self Care | Attending: Emergency Medicine | Admitting: Emergency Medicine

## 2011-08-06 DIAGNOSIS — J020 Streptococcal pharyngitis: Secondary | ICD-10-CM

## 2011-08-06 HISTORY — DX: Tinea unguium: B35.1

## 2011-08-06 MED ORDER — AMOXICILLIN 500 MG PO CAPS: 500.0000 mg | ORAL_CAPSULE | Freq: Three times a day (TID) | ORAL | Status: AC

## 2011-08-06 MED ORDER — ACETAMINOPHEN-CODEINE #3 300-30 MG PO TABS: 1.0000 | ORAL_TABLET | Freq: Four times a day (QID) | ORAL | Status: AC | PRN

## 2011-08-06 MED ORDER — ACETAMINOPHEN-CODEINE #3 300-30 MG PO TABS: 1.0000 | ORAL_TABLET | Freq: Four times a day (QID) | ORAL | Status: DC | PRN

## 2011-08-06 NOTE — Discharge Instructions (Signed)

## 2011-08-06 NOTE — ED Provider Notes (Signed)
History     CSN: [id number]  Arrival date & time 08/06/11  1750   First MD Initiated Contact with Patient 08/06/11 1751      Chief Complaint  Patient presents with  . Sore Throat    (Consider location/radiation/quality/duration/timing/severity/associated sxs/prior treatment) HPI Comments: Patient had been expressing a sore throat for about 5 days it has gotten progressively worse within the last 2 days it hurts to swallow mainly on the right side she feels the anterior part of your right neck also is sore and tender. Denies any fevers or cough or congestion, or shortness of breath  Patient is a 46 y.o. female presenting with pharyngitis. The history is provided by the patient.  Sore Throat This is a new problem. The current episode started more than 2 days ago. The problem occurs daily. The problem has been gradually worsening. Pertinent negatives include no headaches and no shortness of breath. The symptoms are aggravated by swallowing. The symptoms are relieved by nothing. She has tried nothing for the symptoms. The treatment provided no relief.    Past Medical History  Diagnosis Date  . Depression   . Cancer 2010 approx    skin cancer  . Migraine   . Toenail fungus     Past Surgical History  Procedure Date  . Carpal tunnel release   . Hernia repair   . Ankle fracture surgery   . Polypectomy     Family History  Problem Relation Age of Onset  . Arthritis    . Cancer      History  Substance Use Topics  . Smoking status: Current Everyday Smoker -- 1.0 packs/day  . Smokeless tobacco: Not on file  . Alcohol Use: No    OB History    Grav Para Term Preterm Abortions TAB SAB Ect Mult Living   1 1              Review of Systems  Constitutional: Negative.  Negative for activity change and appetite change.  HENT: Positive for sore throat. Negative for trouble swallowing, neck pain and neck stiffness.   Respiratory: Negative for cough and shortness of breath.     Skin: Negative for rash.  Neurological: Negative for dizziness and headaches.    Allergies  Review of patient's allergies indicates no known allergies.  Home Medications   Current Outpatient Rx  Name Route Sig Dispense Refill  . ACETAMINOPHEN-CODEINE #3 300-30 MG PO TABS Oral Take 1-2 tablets by mouth every 6 (six) hours as needed for pain. 15 tablet 0  . AMOXICILLIN 500 MG PO CAPS Oral Take 1 capsule (500 mg total) by mouth 3 (three) times daily. 30 capsule 0  . CYCLOBENZAPRINE HCL 10 MG PO TABS Oral Take 1 tablet (10 mg total) by mouth 3 (three) times daily as needed. 60 tablet 1  . GABAPENTIN 800 MG PO TABS Oral Take 1 tablet (800 mg total) by mouth 3 (three) times daily. 270 tablet 1  . PHENTERMINE HCL 37.5 MG PO TABS Oral Take 1 tablet (37.5 mg total) by mouth daily before breakfast. 30 tablet 2  . PHENTERMINE HCL 37.5 MG PO CAPS Oral Take 1 capsule (37.5 mg total) by mouth every morning. 30 capsule 3  . TERBINAFINE HCL 250 MG PO TABS Oral Take 1 tablet (250 mg total) by mouth daily. 90 tablet 0  . VENLAFAXINE HCL ER 75 MG PO CP24 Oral Take 1 capsule (75 mg total) by mouth daily. 90 capsule 3    Please  dispense as written, effexor XR and not pre ...    BP 124/82  Pulse 84  Temp(Src) 98.4 F (36.9 C) (Oral)  Resp 18  SpO2 99%  LMP 08/05/2011  Physical Exam  Nursing note and vitals reviewed. Constitutional: She appears well-developed and well-nourished.  Non-toxic appearance. She does not have a sickly appearance. She does not appear ill. No distress.  HENT:  Head: Normocephalic.  Right Ear: Tympanic membrane normal.  Left Ear: Tympanic membrane normal.  Mouth/Throat: Uvula is midline and mucous membranes are normal. Posterior oropharyngeal erythema present. No oropharyngeal exudate or tonsillar abscesses.  Eyes: Conjunctivae are normal.  Neck: Trachea normal and normal range of motion. No rigidity. No edema, no erythema and normal range of motion present. No Brudzinski's  sign and no Kernig's sign noted. No mass and no thyromegaly present.  Abdominal: Soft.  Skin: No rash noted. No erythema.    ED Course  Procedures (including critical care time)  Labs Reviewed  POCT RAPID STREP A ([facility]RG CARE ONLY) - Abnormal; Notable for the following:    Streptococcus, Group A Screen (Direct) POSITIVE (*)    All other components within normal limits   No results found.   1. Strep pharyngitis       MDM   Right-sided pharyngitis. For 5 days. Patient says a positive for strep. Will treat with course of amoxicillin. Patient understood and agree with treatment plan       Jimmie Molly, MD 08/06/11 [phone]

## 2011-08-06 NOTE — ED Notes (Signed)
C/o sore throat x 5 days.  States it hurts to swallow. States hurts worse on right side of throat

## 2011-09-14 ENCOUNTER — Ambulatory Visit (INDEPENDENT_AMBULATORY_CARE_PROVIDER_SITE_OTHER): Payer: 59 | Admitting: Family Medicine

## 2011-09-14 ENCOUNTER — Encounter: Payer: Self-pay | Admitting: Family Medicine

## 2011-09-14 VITALS — BP 120/80 | HR 109 | Resp 16 | Ht 62.0 in | Wt 164.0 lb

## 2011-09-14 DIAGNOSIS — G43909 Migraine, unspecified, not intractable, without status migrainosus: Secondary | ICD-10-CM

## 2011-09-14 DIAGNOSIS — F17200 Nicotine dependence, unspecified, uncomplicated: Secondary | ICD-10-CM

## 2011-09-14 DIAGNOSIS — F329 Major depressive disorder, single episode, unspecified: Secondary | ICD-10-CM

## 2011-09-14 DIAGNOSIS — E669 Obesity, unspecified: Secondary | ICD-10-CM

## 2011-09-14 DIAGNOSIS — F3289 Other specified depressive episodes: Secondary | ICD-10-CM

## 2011-09-14 MED ORDER — PHENTERMINE HCL 37.5 MG PO CAPS: 37.5000 mg | ORAL_CAPSULE | ORAL | Status: DC

## 2011-09-14 MED ORDER — BUPROPION HCL ER (XL) 150 MG PO TB24: 150.0000 mg | ORAL_TABLET | Freq: Every day | ORAL | Status: DC

## 2011-09-14 MED ORDER — BUSPIRONE HCL 7.5 MG PO TABS: 7.5000 mg | ORAL_TABLET | Freq: Two times a day (BID) | ORAL | Status: DC

## 2011-09-14 MED ORDER — GABAPENTIN 800 MG PO TABS: 800.0000 mg | ORAL_TABLET | Freq: Three times a day (TID) | ORAL | Status: DC

## 2011-09-14 NOTE — Patient Instructions (Addendum)
F/u in 5 weeks.  New medcations to be started for anxiety and depression, buspar and wellbutrin  You absolutely need to contact employee health for therapy you will be provided with a number

## 2011-09-15 ENCOUNTER — Other Ambulatory Visit: Payer: Self-pay | Admitting: Family Medicine

## 2011-10-03 NOTE — Assessment & Plan Note (Signed)
Uncontrolled, pt to resume medication which she had stopped on her own

## 2011-10-03 NOTE — Assessment & Plan Note (Signed)
Patient counseled for approximately 5 minutes regarding the health risks of ongoing nicotine use, specifically all types of cancer, heart disease, stroke and respiratory failure. The options available for help with cessation ,the behavioral changes to assist the process, and the option to either gradully reduce usage  Or abruptly stop.is also discussed. Pt is also encouraged to set specific goals in number of cigarettes used daily, as well as to set a quit date. Increased use

## 2011-10-03 NOTE — Assessment & Plan Note (Signed)
Improved. Pt applauded on succesful weight loss through lifestyle change, and encouraged to continue same. Weight loss goal set for the next several months.  

## 2011-10-03 NOTE — Assessment & Plan Note (Signed)
Controlled, no change in medication   Followed by headache clinic
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
## 2011-10-19 ENCOUNTER — Ambulatory Visit: Payer: 59 | Admitting: Family Medicine

## 2011-10-19 ENCOUNTER — Other Ambulatory Visit: Payer: Self-pay | Admitting: Family Medicine

## 2011-10-21 ENCOUNTER — Other Ambulatory Visit: Payer: Self-pay | Admitting: Family Medicine

## 2011-10-23 ENCOUNTER — Encounter: Payer: Self-pay | Admitting: Family Medicine

## 2012-01-27 ENCOUNTER — Other Ambulatory Visit: Payer: Self-pay | Admitting: Family Medicine

## 2012-01-27 DIAGNOSIS — Z139 Encounter for screening, unspecified: Secondary | ICD-10-CM

## 2012-02-01 ENCOUNTER — Ambulatory Visit (HOSPITAL_COMMUNITY)
Admission: RE | Admit: 2012-02-01 | Discharge: 2012-02-01 | Disposition: A | Discharge status: Home / Self Care | Payer: 59 | Source: Ambulatory Visit | Attending: Family Medicine | Admitting: Family Medicine

## 2012-02-01 DIAGNOSIS — Z139 Encounter for screening, unspecified: Secondary | ICD-10-CM

## 2012-02-01 DIAGNOSIS — Z1231 Encounter for screening mammogram for malignant neoplasm of breast: Secondary | ICD-10-CM | POA: Insufficient documentation

## 2012-05-24 ENCOUNTER — Ambulatory Visit (INDEPENDENT_AMBULATORY_CARE_PROVIDER_SITE_OTHER): Payer: 59 | Admitting: Family Medicine

## 2012-05-24 ENCOUNTER — Encounter: Payer: Self-pay | Admitting: Family Medicine

## 2012-05-24 VITALS — BP 140/72 | HR 100 | Resp 18 | Ht 62.0 in | Wt 163.0 lb

## 2012-05-24 MED ORDER — NAPROXEN 375 MG PO TABS: 375.0000 mg | ORAL_TABLET | Freq: Two times a day (BID) | ORAL | Status: DC
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
## 2012-05-25 ENCOUNTER — Ambulatory Visit (HOSPITAL_COMMUNITY)
Admission: RE | Admit: 2012-05-25 | Discharge: 2012-05-25 | Disposition: A | Discharge status: Home / Self Care | Payer: 59 | Source: Ambulatory Visit | Attending: Family Medicine | Admitting: Family Medicine

## 2012-05-25 DIAGNOSIS — M25579 Pain in unspecified ankle and joints of unspecified foot: Secondary | ICD-10-CM | POA: Insufficient documentation

## 2012-05-25 DIAGNOSIS — M2140 Flat foot [pes planus] (acquired), unspecified foot: Secondary | ICD-10-CM | POA: Insufficient documentation

## 2012-05-25 DIAGNOSIS — M7730 Calcaneal spur, unspecified foot: Secondary | ICD-10-CM | POA: Insufficient documentation

## 2012-05-25 DIAGNOSIS — R937 Abnormal findings on diagnostic imaging of other parts of musculoskeletal system: Secondary | ICD-10-CM | POA: Insufficient documentation
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
## 2012-05-25 NOTE — Assessment & Plan Note (Signed)
Deteriorated Patient counseled for approximately 5 minutes regarding the health risks of ongoing nicotine use, specifically all types of cancer, heart disease, stroke and respiratory failure. The options available for help with cessation ,the behavioral changes to assist the process, and the option to either gradully reduce usage  Or abruptly stop.is also discussed. Pt is also encouraged to set specific goals in number of cigarettes used daily, as well as to set a quit date.    

## 2012-05-25 NOTE — Assessment & Plan Note (Signed)
Deteriorated. Pt to start welbutrin and therapy

## 2012-05-25 NOTE — Assessment & Plan Note (Signed)
Controlled on gabapentin, uses naproxen and flexeril for acute headache

## 2012-05-25 NOTE — Assessment & Plan Note (Signed)
Unchanged. Patient re-educated about  the importance of commitment to a  minimum of 150 minutes of exercise per week. The importance of healthy food choices with portion control discussed. Encouraged to start a food diary, count calories and to consider  joining a support group. Sample diet sheets offered. Goals set by the patient for the next several months.    

## 2012-05-26 ENCOUNTER — Encounter: Payer: Self-pay | Admitting: Orthopedic Surgery

## 2012-05-26 ENCOUNTER — Ambulatory Visit (INDEPENDENT_AMBULATORY_CARE_PROVIDER_SITE_OTHER): Payer: 59 | Admitting: Orthopedic Surgery

## 2012-05-26 VITALS — BP 130/72 | Ht 62.0 in | Wt 163.0 lb

## 2012-05-26 NOTE — Progress Notes (Signed)
Patient ID: Zoe Bailey, female   DOB: 08/24/1965, 47 y.o.   MRN: [id number] Chief Complaint  Patient presents with  . Ankle Pain    Left ankle fracture, xrays at [facility]. DOI 05-13-12.    HISTORY:  Location LEFT ANKLE   Quality SHARP, THROBBING  Severity 7/10  Timing CONSTANT  Duration MARCH 7 DOI   Context FELL OFF PORCH   Factors better/worse HEAT  Associated symptoms SWELLING  Review of Systems  Endo/Heme/Allergies: Bruises/bleeds easily.  Psychiatric/Behavioral: Positive for depression. The patient is nervous/anxious.   All other systems reviewed and are negative.    Vital signs are stable as recorded  General appearance is normal  The patient is alert and oriented x3  The patient's mood and affect are normal  Gait assessment: ABNORMAL LIMP RIGHT SIDE   The cardiovascular exam reveals normal pulses and temperature without edema or  swelling.  The lymphatic system is negative for palpable lymph nodes  The sensory exam is normal.  There are no pathologic reflexes.  Balance is normal.   Exam of the LEFT ANKLE  Inspection FRACTURE SITE TENDERNESS Range of motion NORMAL  Stability NORMAL  Strength NORMAL  Skin NORMAL   Lateral malleolar fracture, left, closed, initial encounter  AIR CAST   WBAT   SHE DECLINED CRUTCHES   XRAYS IN 6 WEEKS  *REGULAR OV

## 2012-07-07 ENCOUNTER — Ambulatory Visit: Payer: 59 | Admitting: Orthopedic Surgery

## 2012-07-20 ENCOUNTER — Other Ambulatory Visit: Payer: Self-pay | Admitting: Family Medicine

## 2012-07-28 ENCOUNTER — Encounter: Payer: Self-pay | Admitting: Family Medicine

## 2012-07-28 ENCOUNTER — Ambulatory Visit (INDEPENDENT_AMBULATORY_CARE_PROVIDER_SITE_OTHER): Payer: 59 | Admitting: Family Medicine

## 2012-07-28 VITALS — BP 120/86 | HR 86 | Resp 16 | Ht 62.0 in | Wt 158.0 lb

## 2012-07-28 DIAGNOSIS — E669 Obesity, unspecified: Secondary | ICD-10-CM

## 2012-07-28 DIAGNOSIS — F17200 Nicotine dependence, unspecified, uncomplicated: Secondary | ICD-10-CM

## 2012-07-28 DIAGNOSIS — G43909 Migraine, unspecified, not intractable, without status migrainosus: Secondary | ICD-10-CM

## 2012-07-28 DIAGNOSIS — F329 Major depressive disorder, single episode, unspecified: Secondary | ICD-10-CM
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
## 2012-07-28 NOTE — Patient Instructions (Addendum)
F/u in 10 weeks, call if you need me before  CONGRATS on excellent weight loss  Please call for further counseling sessions through employee health  Please get fasting labs this week or next week , I need to see blood work, check your thyroid etc.  You Need to take buspar one twice daily every day for anxiety.  No change in welbutrin  Contact your neurologist re headaches  Work on smoking cessation, you need to quit, this will help your headaches and anxiety. You will get info on classes through the health dept, or check if any are at the hospital

## 2012-08-01 NOTE — Assessment & Plan Note (Signed)
Improved, though anxiety remains uncontrolled. Needs therapy , not motivated at this time to pursue this for financial and personal reasons Has been non compliant with buspar , advised to start this

## 2012-08-01 NOTE — Assessment & Plan Note (Signed)
Reports increase in frequrency, needs to call or f/u with neurology

## 2012-08-01 NOTE — Assessment & Plan Note (Signed)
Improved. Pt applauded on succesful weight loss through lifestyle change, and encouraged to continue same. Weight loss goal set for the next several months. Updated labs needed

## 2012-08-01 NOTE — Assessment & Plan Note (Signed)
Unchanged, unwilling to set quit date at this time Patient counseled for approximately 5 minutes regarding the health risks of ongoing nicotine use, specifically all types of cancer, heart disease, stroke and respiratory failure. The options available for help with cessation ,the behavioral changes to assist the process, and the option to either gradully reduce usage  Or abruptly stop.is also discussed. Pt is also encouraged to set specific goals in number of cigarettes used daily, as well as to set a quit date.

## 2012-09-16 ENCOUNTER — Telehealth: Payer: Self-pay | Admitting: Family Medicine

## 2012-09-16 ENCOUNTER — Other Ambulatory Visit: Payer: Self-pay | Admitting: Family Medicine

## 2012-09-16 DIAGNOSIS — F329 Major depressive disorder, single episode, unspecified: Secondary | ICD-10-CM

## 2012-09-16 MED ORDER — BUSPIRONE HCL 7.5 MG PO TABS: 7.5000 mg | ORAL_TABLET | Freq: Two times a day (BID) | ORAL | Status: AC

## 2012-09-16 NOTE — Telephone Encounter (Signed)
Refill sent.

## 2012-10-06 ENCOUNTER — Ambulatory Visit: Payer: 59 | Admitting: Family Medicine

## 2012-10-12 ENCOUNTER — Ambulatory Visit: Payer: 59 | Admitting: Family Medicine

## 2012-12-22 ENCOUNTER — Other Ambulatory Visit: Payer: Self-pay | Admitting: Family Medicine

## 2012-12-22 LAB — LIPID PANEL
HDL: 68 mg/dL (ref >39)
LDL Cholesterol: 64 mg/dL (ref 0–99)
Total CHOL/HDL Ratio: 2.2 Ratio
Triglycerides: 75 mg/dL (ref <150)

## 2012-12-22 LAB — CBC
MCH: 30.8 pg (ref 26.0–34.0)
MCHC: 34 g/dL (ref 30.0–36.0)
Platelets: 254 10*3/uL (ref 150–400)
RBC: 4.58 MIL/uL (ref 3.87–5.11)
RDW: 13.6 % (ref 11.5–15.5)
WBC: 9.7 10*3/uL (ref 4.0–10.5)

## 2012-12-22 LAB — BASIC METABOLIC PANEL
CO2: 29 mEq/L (ref 19–32)
Chloride: 104 mEq/L (ref 96–112)
Creat: 0.65 mg/dL (ref 0.50–1.10)
Sodium: 137 mEq/L (ref 135–145)

## 2012-12-22 LAB — TSH: TSH: 2.126 u[IU]/mL (ref 0.350–4.500)

## 2013-01-04 ENCOUNTER — Encounter: Payer: Self-pay | Admitting: Family Medicine

## 2013-01-04 ENCOUNTER — Other Ambulatory Visit (HOSPITAL_COMMUNITY)
Admission: RE | Admit: 2013-01-04 | Discharge: 2013-01-04 | Disposition: A | Discharge status: Home / Self Care | Payer: 59 | Source: Ambulatory Visit | Attending: Family Medicine | Admitting: Family Medicine

## 2013-01-04 ENCOUNTER — Ambulatory Visit (INDEPENDENT_AMBULATORY_CARE_PROVIDER_SITE_OTHER): Payer: 59 | Admitting: Family Medicine

## 2013-01-04 VITALS — BP 122/82 | HR 90 | Resp 16 | Ht 62.0 in | Wt 165.4 lb

## 2013-01-04 DIAGNOSIS — E669 Obesity, unspecified: Secondary | ICD-10-CM

## 2013-01-04 DIAGNOSIS — F411 Generalized anxiety disorder: Secondary | ICD-10-CM | POA: Insufficient documentation

## 2013-01-04 DIAGNOSIS — N760 Acute vaginitis: Secondary | ICD-10-CM | POA: Insufficient documentation

## 2013-01-04 DIAGNOSIS — Z113 Encounter for screening for infections with a predominantly sexual mode of transmission: Secondary | ICD-10-CM | POA: Insufficient documentation

## 2013-01-04 DIAGNOSIS — F17200 Nicotine dependence, unspecified, uncomplicated: Secondary | ICD-10-CM

## 2013-01-04 DIAGNOSIS — G43909 Migraine, unspecified, not intractable, without status migrainosus: Secondary | ICD-10-CM

## 2013-01-04 LAB — RPR

## 2013-01-04 MED ORDER — PHENTERMINE HCL 37.5 MG PO TABS: 37.5000 mg | ORAL_TABLET | Freq: Every day | ORAL | Status: DC

## 2013-01-04 NOTE — Assessment & Plan Note (Signed)
Specimens sent, treatment will depend on results obtained, pt aware. Condom use advised

## 2013-01-04 NOTE — Assessment & Plan Note (Signed)
Controlled on daily gabapentin

## 2013-01-04 NOTE — Assessment & Plan Note (Signed)
Deteriorated. Patient re-educated about  the importance of commitment to a  minimum of 150 minutes of exercise per week. The importance of healthy food choices with portion control discussed. Encouraged to start a food diary, count calories and to consider  joining a support group. Sample diet sheets offered. Goals set by the patient for the next several months. Resume daily phentermine
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
## 2013-01-04 NOTE — Assessment & Plan Note (Signed)
Unwilling to set a quit date at this time Patient counseled for approximately 5 minutes regarding the health risks of ongoing nicotine use, specifically all types of cancer, heart disease, stroke and respiratory failure. The options available for help with cessation ,the behavioral changes to assist the process, and the option to either gradully reduce usage  Or abruptly stop.is also discussed. Pt is also encouraged to set specific goals in number of cigarettes used daily, as well as to set a quit date.

## 2013-01-04 NOTE — Assessment & Plan Note (Signed)
Controlled, no change in medication  

## 2013-01-04 NOTE — Patient Instructions (Signed)
cPE and pap in 6 weeks,  Call if you need me before  You will be contacted with results as soon as they become available  HIV, hSV2 and RPR tests today  Start phentermine one daily, weight loss goal of 2.5 to 3 pounds per montnh  Cut back on cigarettes you need to quit

## 2013-01-05 ENCOUNTER — Telehealth: Payer: Self-pay | Admitting: *Deleted

## 2013-01-05 ENCOUNTER — Other Ambulatory Visit: Payer: Self-pay | Admitting: Family Medicine

## 2013-01-05 NOTE — Telephone Encounter (Signed)
Pt called wanting results from her blood work. [id number], [phone] CELL

## 2013-01-05 NOTE — Telephone Encounter (Signed)
Called and gave patient results of hiv and rpr.

## 2013-01-06 ENCOUNTER — Other Ambulatory Visit: Payer: Self-pay

## 2013-01-06 MED ORDER — METRONIDAZOLE 500 MG PO TABS: 500.0000 mg | ORAL_TABLET | Freq: Two times a day (BID) | ORAL | Status: DC

## 2013-02-07 ENCOUNTER — Encounter: Payer: Self-pay | Admitting: Family Medicine

## 2013-02-07 ENCOUNTER — Ambulatory Visit (INDEPENDENT_AMBULATORY_CARE_PROVIDER_SITE_OTHER): Payer: 59 | Admitting: Family Medicine

## 2013-02-07 VITALS — BP 130/80 | HR 100 | Resp 18 | Ht 62.0 in | Wt 163.1 lb

## 2013-02-07 DIAGNOSIS — G43909 Migraine, unspecified, not intractable, without status migrainosus: Secondary | ICD-10-CM

## 2013-02-07 DIAGNOSIS — F17200 Nicotine dependence, unspecified, uncomplicated: Secondary | ICD-10-CM

## 2013-02-07 DIAGNOSIS — E669 Obesity, unspecified: Secondary | ICD-10-CM

## 2013-02-07 DIAGNOSIS — F329 Major depressive disorder, single episode, unspecified: Secondary | ICD-10-CM

## 2013-02-07 MED ORDER — PHENTERMINE HCL 37.5 MG PO TABS: 37.5000 mg | ORAL_TABLET | Freq: Every day | ORAL | Status: DC

## 2013-02-07 NOTE — Patient Instructions (Addendum)
CPE in 3.5 month, call if you need me before  Congrats on reducing cigarettes please plan to quit   I am, happy that you are doing much better  Please schedule and keep your mamogram  Please call for therapy
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
## 2013-02-25 NOTE — Assessment & Plan Note (Signed)
contorlled on current meds

## 2013-02-25 NOTE — Assessment & Plan Note (Signed)
applauded pt on reduced nicotine usage Patient counseled for approximately 5 minutes regarding the health risks of ongoing nicotine use, specifically all types of cancer, heart disease, stroke and respiratory failure. The options available for help with cessation ,the behavioral changes to assist the process, and the option to either gradully reduce usage  Or abruptly stop.is also discussed. Pt is also encouraged to set specific goals in number of cigarettes used daily, as well as to set a quit date.
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
## 2013-02-27 ENCOUNTER — Other Ambulatory Visit: Payer: Self-pay | Admitting: Family Medicine

## 2013-02-27 DIAGNOSIS — Z139 Encounter for screening, unspecified: Secondary | ICD-10-CM

## 2013-02-28 ENCOUNTER — Inpatient Hospital Stay (HOSPITAL_COMMUNITY): Admission: RE | Admit: 2013-02-28 | Payer: 59 | Source: Ambulatory Visit

## 2013-02-28 ENCOUNTER — Ambulatory Visit (HOSPITAL_COMMUNITY)
Admission: RE | Admit: 2013-02-28 | Discharge: 2013-02-28 | Disposition: A | Discharge status: Home / Self Care | Payer: 59 | Source: Ambulatory Visit | Attending: Family Medicine | Admitting: Family Medicine

## 2013-02-28 DIAGNOSIS — Z139 Encounter for screening, unspecified: Secondary | ICD-10-CM

## 2013-02-28 DIAGNOSIS — Z1231 Encounter for screening mammogram for malignant neoplasm of breast: Secondary | ICD-10-CM | POA: Insufficient documentation

## 2013-03-24 ENCOUNTER — Telehealth: Payer: Self-pay

## 2013-03-24 ENCOUNTER — Ambulatory Visit (INDEPENDENT_AMBULATORY_CARE_PROVIDER_SITE_OTHER): Payer: 59 | Admitting: Family Medicine

## 2013-03-24 ENCOUNTER — Encounter: Payer: Self-pay | Admitting: Family Medicine

## 2013-03-24 VITALS — BP 126/70 | HR 93 | Temp 98.6°F | Resp 18 | Wt 161.1 lb

## 2013-03-24 DIAGNOSIS — J0100 Acute maxillary sinusitis, unspecified: Secondary | ICD-10-CM

## 2013-03-24 DIAGNOSIS — G43909 Migraine, unspecified, not intractable, without status migrainosus: Secondary | ICD-10-CM

## 2013-03-24 MED ORDER — SULFAMETHOXAZOLE-TMP DS 800-160 MG PO TABS: 1.0000 | ORAL_TABLET | Freq: Two times a day (BID) | ORAL | Status: DC

## 2013-03-24 MED ORDER — BENZONATATE 100 MG PO CAPS: 100.0000 mg | ORAL_CAPSULE | Freq: Two times a day (BID) | ORAL | Status: DC | PRN

## 2013-03-24 NOTE — Progress Notes (Signed)
   Subjective:    Patient ID: Zoe Bailey, female    DOB: December 31, 1965, 48 y.o.   MRN: [id number]  HPI 3 week h/o head and chest congestion, no longer has sputum, c/o maxillary pressure and green nasal drainage, denies any current fever , but had chills 1 week ago and low grade fever.Denies ear pain or sore throat Had botox injection for headache 1 day ago, states Xmas was "lonely" however , she is learning to be content with solitude, still has not called employee health for counselling   Review of Systems See HPI Denies chest pains, palpitations and leg swelling Denies abdominal pain, nausea, vomiting,diarrhea or constipation.   Denies dysuria, frequency, hesitancy or incontinence. Denies joint pain, swelling and limitation in mobility. Denies  seizures, numbness, or tingling. Denies uncontrolled  depression, anxiety or insomnia. Denies skin break down or rash.        Objective:   Physical Exam Patient alert and oriented and in no cardiopulmonary distress.  HEENT: No facial asymmetry, EOMI, maxillary  sinus tenderness,  oropharynx pink and moist.  Neck supple no adenopathy.TM clear  Chest: Clear to auscultation bilaterally.  CVS: S1, S2 no murmurs, no S3.  ABD: Soft non tender. Bowel sounds normal.  Ext: No edema   Skin: Intact, no ulcerations or rash noted.  Psych: Good eye contact, normal affect. Memory intact not anxious or depressed appearing.  CNS: CN 2-12 intact, power, tone and sensation normal throughout.        Assessment & Plan:

## 2013-03-24 NOTE — Patient Instructions (Signed)
F/u as before  You are being treated for sinusitis, 2 medications are sent to [facility] per your request.  I hope you feel better soon

## 2013-03-24 NOTE — Telephone Encounter (Signed)
Seen in office.

## 2013-03-25 NOTE — Assessment & Plan Note (Signed)
Uncontrolled by history as had botox injection 1 day ago, she is treated by neurology

## 2013-03-25 NOTE — Assessment & Plan Note (Signed)
Antibiotic and decongestant prescribed 

## 2013-04-06 ENCOUNTER — Other Ambulatory Visit: Payer: Self-pay | Admitting: Family Medicine

## 2013-06-01 ENCOUNTER — Encounter: Payer: 59 | Admitting: Family Medicine

## 2013-07-11 ENCOUNTER — Other Ambulatory Visit (HOSPITAL_COMMUNITY)
Admission: RE | Admit: 2013-07-11 | Discharge: 2013-07-11 | Disposition: A | Discharge status: Home / Self Care | Payer: 59 | Source: Ambulatory Visit | Attending: Family Medicine | Admitting: Family Medicine

## 2013-07-11 ENCOUNTER — Ambulatory Visit (INDEPENDENT_AMBULATORY_CARE_PROVIDER_SITE_OTHER): Payer: 59 | Admitting: Family Medicine

## 2013-07-11 ENCOUNTER — Encounter: Payer: Self-pay | Admitting: Family Medicine

## 2013-07-11 VITALS — BP 126/82 | HR 84 | Resp 18 | Wt 164.1 lb

## 2013-07-11 DIAGNOSIS — Z01419 Encounter for gynecological examination (general) (routine) without abnormal findings: Secondary | ICD-10-CM | POA: Insufficient documentation

## 2013-07-11 DIAGNOSIS — Z113 Encounter for screening for infections with a predominantly sexual mode of transmission: Secondary | ICD-10-CM

## 2013-07-11 DIAGNOSIS — Z1211 Encounter for screening for malignant neoplasm of colon: Secondary | ICD-10-CM

## 2013-07-11 DIAGNOSIS — Z1151 Encounter for screening for human papillomavirus (HPV): Secondary | ICD-10-CM | POA: Insufficient documentation

## 2013-07-11 DIAGNOSIS — F17200 Nicotine dependence, unspecified, uncomplicated: Secondary | ICD-10-CM

## 2013-07-11 DIAGNOSIS — L989 Disorder of the skin and subcutaneous tissue, unspecified: Secondary | ICD-10-CM

## 2013-07-11 DIAGNOSIS — N920 Excessive and frequent menstruation with regular cycle: Secondary | ICD-10-CM | POA: Insufficient documentation

## 2013-07-11 DIAGNOSIS — N760 Acute vaginitis: Secondary | ICD-10-CM | POA: Insufficient documentation

## 2013-07-11 DIAGNOSIS — Z0000 Encounter for general adult medical examination without abnormal findings: Secondary | ICD-10-CM

## 2013-07-11 DIAGNOSIS — Z124 Encounter for screening for malignant neoplasm of cervix: Secondary | ICD-10-CM

## 2013-07-11 HISTORY — DX: Excessive and frequent menstruation with regular cycle: N92.0

## 2013-07-11 LAB — POC HEMOCCULT BLD/STL (OFFICE/1-CARD/DIAGNOSTIC): FECAL OCCULT BLD: NEGATIVE

## 2013-07-11 NOTE — Assessment & Plan Note (Signed)
2 year h/o puigmented lesion with change in  Color, darkeneing, needs to see derm

## 2013-07-11 NOTE — Patient Instructions (Addendum)
F/u in 4 month, call if you need me before  You are referred for skin and gyne evaluations  Please cut back on cigarettes with a plan to quitting  Blood tests for STD testing will be ordered as you request.  CONDOM use REDUCES the risk of STD, advised to use regularly  It is important that you exercise regularly at least 30 minutes 5 times a week. If you develop chest pain, have severe difficulty breathing, or feel very tired, stop exercising immediately and seek medical attention   A healthy diet is rich in fruit, vegetables and whole grains. Poultry fish, nuts and beans are a healthy choice for protein rather then red meat. A low sodium diet and drinking 64 ounces of water daily is generally recommended. Oils and sweet should be limited. Carbohydrates especially for those who are  overweight, should be limited to 60-45 gram per meal. It is important to eat on a regular schedule, at least 3 times daily. Snacks should be primarily fruits, vegetables or nuts.  Weight loss goal of 2 to 3 pounds per month

## 2013-07-11 NOTE — Progress Notes (Signed)
   Subjective:    Patient ID: Zoe Bailey, female    DOB: Jul 12, 1965, 48 y.o.   MRN: [id number]  HPI Pt in for annual physical exam She has concerns about frequent heavy cycles following insertion of iUD and requests referral back to gyne for further evaluation and management of this.Has new skin lesions which she wants a dermatology opinion on , which is appropriate   Review of Systems See HPI     Objective:   Physical Exam Pleasant well nourished female, alert and oriented x 3, in no cardio-pulmonary distress. Afebrile. HEENT No facial trauma or asymetry. Sinuses non tender.  EOMI, PERTL External ears normal, tympanic membranes clear. Oropharynx moist, no exudate, good dentition. Neck: supple, no adenopathy,JVD or thyromegaly.No bruits.  Chest: Clear to ascultation bilaterally.No crackles or wheezes. Non tender to palpation  Breast: No asymetry,no masses. No nipple discharge or inversion. No axillary or supraclavicular adenopathy  Cardiovascular system; Heart sounds normal,  S1 and  S2 ,no S3.  No murmur, or thrill. Apical beat not displaced Peripheral pulses normal.  Abdomen: Soft, non tender, no organomegaly or masses. No bruits. Bowel sounds normal. No guarding, tenderness or rebound.  Rectal:  No mass. Guaiac negative stool.  GU: External genitalia normal. No lesions. Vaginal canal normal.white fishy discharge.string of IUD seen  Uterus slightly enlarged, about 8 weeks, no adnexal masses, no cervical motion or adnexal tenderness.  Musculoskeletal exam: Full ROM of spine, hips , shoulders and knees. No deformity ,swelling or crepitus noted. No muscle wasting or atrophy.   Neurologic: Cranial nerves 2 to 12 intact. Power, tone ,sensation and reflexes normal throughout. No disturbance in gait. No tremor.  Skin: Intact, no ulceration, erythema , several flat hyperpigmented lesions noted, some are rough, pt is extensively freckled . Psych; Normal mood  and affect. Judgement and concentration normal        Assessment & Plan:  SKIN LESION 2 year h/o puigmented lesion with change in  Color, darkeneing, needs to see derm  Annual physical exam Annual exam as documented. Specimens sent for testing of vaginal d/c  Smoking cessation counseling done Referred for gyne and skin eval

## 2013-07-12 LAB — CERVICOVAGINAL ANCILLARY ONLY
Wet Prep (BD Affirm): NEGATIVE
Wet Prep (BD Affirm): NEGATIVE
Wet Prep (BD Affirm): POSITIVE — AB

## 2013-07-13 MED ORDER — METRONIDAZOLE 500 MG PO TABS: 500.0000 mg | ORAL_TABLET | Freq: Two times a day (BID) | ORAL | Status: DC

## 2013-07-14 ENCOUNTER — Encounter: Payer: Self-pay | Admitting: Obstetrics & Gynecology

## 2013-07-14 ENCOUNTER — Ambulatory Visit (INDEPENDENT_AMBULATORY_CARE_PROVIDER_SITE_OTHER): Payer: 59 | Admitting: Obstetrics & Gynecology

## 2013-07-14 VITALS — BP 130/80 | Ht 61.4 in | Wt 162.0 lb

## 2013-07-14 DIAGNOSIS — N921 Excessive and frequent menstruation with irregular cycle: Secondary | ICD-10-CM

## 2013-07-14 DIAGNOSIS — N920 Excessive and frequent menstruation with regular cycle: Secondary | ICD-10-CM

## 2013-07-14 DIAGNOSIS — T839XXA Unspecified complication of genitourinary prosthetic device, implant and graft, initial encounter: Secondary | ICD-10-CM | POA: Insufficient documentation

## 2013-07-14 HISTORY — DX: Excessive and frequent menstruation with irregular cycle: N92.1

## 2013-07-14 MED ORDER — MEGESTROL ACETATE 40 MG PO TABS: ORAL_TABLET | ORAL | Status: DC

## 2013-07-14 NOTE — Progress Notes (Signed)
Patient ID: Zoe Bailey, female   DOB: 27-May-1965, 48 y.o.   MRN: [id number] Pt with paragard IUD in for 3years Bleeds very heavily, uses maternity type pads Now twice a month No pain with the bleeding   Exam Vulva is normal without lesions Vagina is pink moist without discharge Cervix normal in appearance and pap is normal Uterus is a little generous maybe 8 weeks size Adnexa is negative   Menometrorrhagia from IUD and peri menopause  Megestrol algorithm Follow up 1 month and assess uterus with sonogam  Longer term management based on response to megestrol and sonographic findings  Past Medical History  Diagnosis Date  . Depression   . Cancer 2010 approx    skin cancer  . Migraine   . Toenail fungus     Past Surgical History  Procedure Laterality Date  . Carpal tunnel release    . Hernia repair    . Polypectomy    . Ankle fracture surgery Right     OB History   Grav Para Term Preterm Abortions TAB SAB Ect Mult Living   1 1              No Known Allergies  History   Social History  . Marital Status: Married    Spouse Name: N/A    Number of Children: N/A  . Years of Education: college   Occupational History  .     Social History Main Topics  . Smoking status: Current Every Day Smoker -- 1.00 packs/day  . Smokeless tobacco: None  . Alcohol Use: No  . Drug Use: No  . Sexual Activity: None   Other Topics Concern  . None   Social History Narrative  . None    Family History  Problem Relation Age of Onset  . Arthritis    . Cancer

## 2013-07-31 DIAGNOSIS — Z0000 Encounter for general adult medical examination without abnormal findings: Secondary | ICD-10-CM | POA: Insufficient documentation

## 2013-07-31 NOTE — Assessment & Plan Note (Signed)
Annual exam as documented. Specimens sent for testing of vaginal d/c  Smoking cessation counseling done Referred for gyne and skin eval

## 2013-08-11 ENCOUNTER — Other Ambulatory Visit: Payer: Self-pay

## 2013-08-11 MED ORDER — PHENTERMINE HCL 37.5 MG PO TABS: 37.5000 mg | ORAL_TABLET | Freq: Every day | ORAL | Status: DC

## 2013-08-14 ENCOUNTER — Ambulatory Visit: Payer: 59 | Admitting: Obstetrics & Gynecology

## 2013-08-14 ENCOUNTER — Other Ambulatory Visit: Payer: 59

## 2013-08-17 ENCOUNTER — Other Ambulatory Visit: Payer: Self-pay | Admitting: Obstetrics & Gynecology

## 2013-08-17 ENCOUNTER — Ambulatory Visit (INDEPENDENT_AMBULATORY_CARE_PROVIDER_SITE_OTHER): Payer: 59

## 2013-08-17 ENCOUNTER — Encounter: Payer: Self-pay | Admitting: Obstetrics & Gynecology

## 2013-08-17 ENCOUNTER — Ambulatory Visit (INDEPENDENT_AMBULATORY_CARE_PROVIDER_SITE_OTHER): Payer: 59 | Admitting: Obstetrics & Gynecology

## 2013-08-17 VITALS — BP 120/80 | Wt 162.0 lb

## 2013-08-17 DIAGNOSIS — N921 Excessive and frequent menstruation with irregular cycle: Secondary | ICD-10-CM

## 2013-08-17 DIAGNOSIS — N920 Excessive and frequent menstruation with regular cycle: Secondary | ICD-10-CM

## 2013-08-17 DIAGNOSIS — T839XXA Unspecified complication of genitourinary prosthetic device, implant and graft, initial encounter: Secondary | ICD-10-CM

## 2013-08-17 NOTE — Progress Notes (Signed)
Patient ID: Zoe Bailey, female   DOB: 01-07-1966, 48 y.o.   MRN: [id number] Blood pressure 120/80, weight 162 lb (73.483 kg), last menstrual period 08/16/2013.  US Transvaginal Non-ob  08/17/2013   GYNECOLOGIC SONOGRAM   Zoe Bailey is a 48 y.o. G1P1 LMP 08/16/2013 for a pelvic sonogram for  menorrhagia. Pt with Paragard IUD x 3 years.  Uterus                      7.4 x 4.5 x 3.5 cm, anteverted no myometrial  masses noted  Endometrium          6.1 mm, symmetrical, with IUD noted centrally within  the Endom. Cavity  Right ovary             2.4 x 1.3 x 1.2 cm,   Left ovary                2.7 x 1.5 x 1.3 cm,   No free fluid or adnexal masses noted within the pelvis  Technician Comments:  Anteverted uterus with IUD located centrally within the endometrial  cavity, bilateral ovaries appears WNL, no free fluid or adnexal masses  noted within the pelvis   Lazarus Gowda 08/17/2013 2:55 PM  Normal gynecologic anatomy No anatomical etiology for bleeding IUD in proper place  Vincent Ehrler H 08/17/2013 3:37 PM     Preoperative History and Physical  Zoe Bailey is a 48 y.o. G1P1 with Patient's last menstrual period was 08/16/2013. admitted for a laparoscopic bilateral salpingectomy and hysteroscopy uterine curettage endometrial ablation.  Pt has been having exceedingly heavy periods twice a month but cannot use any other form of birth control due to sensitivities.  Sonogram as above.  She has not been responsive to megestrol As a result will proceed with the surgery described above  PMH:    Past Medical History  Diagnosis Date  . Depression   . Cancer 2010 approx    skin cancer  . Migraine   . Toenail fungus     PSH:     Past Surgical History  Procedure Laterality Date  . Carpal tunnel release    . Hernia repair    . Polypectomy    . Ankle fracture surgery Right     POb/GynH:      OB History   Grav Para Term Preterm Abortions TAB SAB Ect Mult Living   1 1              SH:   History  Substance Use  Topics  . Smoking status: Current Every Day Smoker -- 1.00 packs/day  . Smokeless tobacco: Not on file  . Alcohol Use: No    FH:    Family History  Problem Relation Age of Onset  . Arthritis    . Cancer       Allergies: No Known Allergies  Medications:      Current outpatient prescriptions:busPIRone (BUSPAR) 7.5 MG tablet, Take 1 tablet (7.5 mg total) by mouth 2 (two) times daily., Disp: 180 tablet, Rfl: 2;  megestrol (MEGACE) 40 MG tablet, 3 tablets a day for 5 days, 2 tablets a day for 5 days the 1 tablet per day, Disp: 45 tablet, Rfl: 2;  phentermine (ADIPEX-P) 37.5 MG tablet, Take 1 tablet (37.5 mg total) by mouth daily before breakfast., Disp: 30 tablet, Rfl: 1 tiZANidine (ZANAFLEX) 4 MG capsule, Take 4 mg by mouth 2 (two) times daily as needed for muscle spasms. Max of  2 times weekly, Disp: , Rfl: ;  cyclobenzaprine (FLEXERIL) 10 MG tablet, TAKE 1 TABLET BY MOUTH 3 TIMES DAILY AS NEEDED., Disp: 60 tablet, Rfl: 1;  gabapentin (NEURONTIN) 800 MG tablet, Take 1 tablet (800 mg total) by mouth 3 (three) times daily., Disp: 270 tablet, Rfl: 1 metroNIDAZOLE (FLAGYL) 500 MG tablet, Take 1 tablet (500 mg total) by mouth 2 (two) times daily., Disp: 14 tablet, Rfl: 0  Review of Systems:   Review of Systems  Constitutional: Negative for fever, chills, weight loss, malaise/fatigue and diaphoresis.  HENT: Negative for hearing loss, ear pain, nosebleeds, congestion, sore throat, neck pain, tinnitus and ear discharge.   Eyes: Negative for blurred vision, double vision, photophobia, pain, discharge and redness.  Respiratory: Negative for cough, hemoptysis, sputum production, shortness of breath, wheezing and stridor.   Cardiovascular: Negative for chest pain, palpitations, orthopnea, claudication, leg swelling and PND.  Gastrointestinal: negative for abdominal pain. Negative for heartburn, nausea, vomiting, diarrhea, constipation, blood in stool and melena.  Genitourinary: Negative for dysuria,  urgency, frequency, hematuria and flank pain.  Musculoskeletal: Negative for myalgias, back pain, joint pain and falls.  Skin: Negative for itching and rash.  Neurological: Negative for dizziness, tingling, tremors, sensory change, speech change, focal weakness, seizures, loss of consciousness, weakness and headaches.  Endo/Heme/Allergies: Negative for environmental allergies and polydipsia. Does not bruise/bleed easily.  Psychiatric/Behavioral: Negative for depression, suicidal ideas, hallucinations, memory loss and substance abuse. The patient is not nervous/anxious and does not have insomnia.      PHYSICAL EXAM:  Blood pressure 120/80, weight 162 lb (73.483 kg), last menstrual period 08/16/2013.    Vitals reviewed. Constitutional: She is oriented to person, place, and time. She appears well-developed and well-nourished.  HENT:  Head: Normocephalic and atraumatic.  Right Ear: External ear normal.  Left Ear: External ear normal.  Nose: Nose normal.  Mouth/Throat: Oropharynx is clear and moist.  Eyes: Conjunctivae and EOM are normal. Pupils are equal, round, and reactive to light. Right eye exhibits no discharge. Left eye exhibits no discharge. No scleral icterus.  Neck: Normal range of motion. Neck supple. No tracheal deviation present. No thyromegaly present.  Cardiovascular: Normal rate, regular rhythm, normal heart sounds and intact distal pulses.  Exam reveals no gallop and no friction rub.   No murmur heard. Respiratory: Effort normal and breath sounds normal. No respiratory distress. She has no wheezes. She has no rales. She exhibits no tenderness.  GI: Soft. Bowel sounds are normal. She exhibits no distension and no mass. There is tenderness. There is no rebound and no guarding.  Genitourinary:       Vulva is normal without lesions Vagina is pink moist without discharge Cervix normal in appearance and pap is normal previously Uterus is normal Adnexa is negative with normal  sized ovaries by sonogram  Musculoskeletal: Normal range of motion. She exhibits no edema and no tenderness.  Neurological: She is alert and oriented to person, place, and time. She has normal reflexes. She displays normal reflexes. No cranial nerve deficit. She exhibits normal muscle tone. Coordination normal.  Skin: Skin is warm and dry. No rash noted. No erythema. No pallor.  Psychiatric: She has a normal mood and affect. Her behavior is normal. Judgment and thought content normal.    Labs: No results found for this or any previous visit (from the past 336 hour(s)).  EKG: No orders found for this or any previous visit.  Imaging Studies: US Transvaginal Non-ob  09-Sep-2013   GYNECOLOGIC SONOGRAM  Zoe Bailey is a 48 y.o. G1P1 LMP 08/16/2013 for a pelvic sonogram for  menorrhagia. Pt with Paragard IUD x 3 years.  Uterus                      7.4 x 4.5 x 3.5 cm, anteverted no myometrial  masses noted  Endometrium          6.1 mm, symmetrical, with IUD noted centrally within  the Endom. Cavity  Right ovary             2.4 x 1.3 x 1.2 cm,   Left ovary                2.7 x 1.5 x 1.3 cm,   No free fluid or adnexal masses noted within the pelvis  Technician Comments:  Anteverted uterus with IUD located centrally within the endometrial  cavity, bilateral ovaries appears WNL, no free fluid or adnexal masses  noted within the pelvis   Lazarus Gowda 08/17/2013 2:55 PM  Normal gynecologic anatomy No anatomical etiology for bleeding IUD in proper place  Stepfanie Yott H 08/17/2013 3:37 PM        Assessment: Patient Active Problem List   Diagnosis Date Noted  . Annual physical exam 07/31/2013  . Menometrorrhagia 07/14/2013  . IUD complication 32/67/1245  . Polymenorrhea 07/11/2013  . GAD (generalized anxiety disorder) 01/04/2013  . Lateral malleolar fracture 05/26/2012  . Onychomycosis of toenail 05/11/2011  . CMC arthritis, thumb, degenerative 04/02/2011  . TFCC (triangular fibrocartilage complex)  injury 04/02/2011  . OBESITY 10/22/2009  . DEPRESSION 10/22/2009  . SKIN LESION 06/11/2009  . FATIGUE 06/11/2009  . NICOTINE ADDICTION 10/09/2008  . MIGRAINE HEADACHE 02/24/2007    Plan: Laparoscopic bilateral salpingectomy for sterilization + Hysteroscopy uteine curettage removal of IUD endometrial ablation  Myla Mauriello H 08/17/2013 3:45 PM

## 2013-08-28 ENCOUNTER — Telehealth: Payer: Self-pay | Admitting: Obstetrics & Gynecology

## 2013-08-28 NOTE — Telephone Encounter (Signed)
Pt has several questions in regards to her surgery that is scheduled with Dr. Elonda Husky on 09/15/2012. Pt states did not realize the out of pocket cost for the surgery and would like to discuss with Dr. Elonda Husky other possible options. Offered pt an appt but she would prefer Dr. Elonda Husky call her to avoid another $25.00 co-pay.

## 2013-09-11 ENCOUNTER — Inpatient Hospital Stay (HOSPITAL_COMMUNITY): Admission: RE | Admit: 2013-09-11 | Payer: 59 | Source: Ambulatory Visit

## 2013-09-15 ENCOUNTER — Encounter: Payer: 59 | Admitting: Obstetrics & Gynecology

## 2013-09-15 ENCOUNTER — Ambulatory Visit (HOSPITAL_COMMUNITY): Admission: RE | Admit: 2013-09-15 | Payer: 59 | Source: Ambulatory Visit | Admitting: Obstetrics & Gynecology

## 2013-09-15 ENCOUNTER — Encounter (HOSPITAL_COMMUNITY): Admission: RE | Payer: Self-pay | Source: Ambulatory Visit

## 2013-09-15 SURGERY — DILATATION & CURETTAGE/HYSTEROSCOPY WITH THERMACHOICE ABLATION
Anesthesia: General | Site: Vagina

## 2013-09-22 ENCOUNTER — Encounter: Payer: 59 | Admitting: Obstetrics & Gynecology

## 2013-11-20 ENCOUNTER — Encounter: Payer: Self-pay | Admitting: Family Medicine

## 2013-11-20 ENCOUNTER — Ambulatory Visit (INDEPENDENT_AMBULATORY_CARE_PROVIDER_SITE_OTHER): Payer: 59 | Admitting: Family Medicine

## 2013-11-20 VITALS — BP 102/64 | HR 88 | Resp 18 | Ht 61.0 in | Wt 166.1 lb

## 2013-11-20 DIAGNOSIS — G43909 Migraine, unspecified, not intractable, without status migrainosus: Secondary | ICD-10-CM

## 2013-11-20 DIAGNOSIS — F17200 Nicotine dependence, unspecified, uncomplicated: Secondary | ICD-10-CM

## 2013-11-20 DIAGNOSIS — M62830 Muscle spasm of back: Secondary | ICD-10-CM | POA: Insufficient documentation

## 2013-11-20 DIAGNOSIS — F3289 Other specified depressive episodes: Secondary | ICD-10-CM

## 2013-11-20 DIAGNOSIS — E669 Obesity, unspecified: Secondary | ICD-10-CM

## 2013-11-20 DIAGNOSIS — Z13220 Encounter for screening for lipoid disorders: Secondary | ICD-10-CM

## 2013-11-20 DIAGNOSIS — F411 Generalized anxiety disorder: Secondary | ICD-10-CM

## 2013-11-20 DIAGNOSIS — M5380 Other specified dorsopathies, site unspecified: Secondary | ICD-10-CM

## 2013-11-20 DIAGNOSIS — F329 Major depressive disorder, single episode, unspecified: Secondary | ICD-10-CM

## 2013-11-20 DIAGNOSIS — N920 Excessive and frequent menstruation with regular cycle: Secondary | ICD-10-CM

## 2013-11-20 DIAGNOSIS — Z1329 Encounter for screening for other suspected endocrine disorder: Secondary | ICD-10-CM

## 2013-11-20 MED ORDER — FLUOXETINE HCL (PMDD) 10 MG PO TABS: 1.0000 | ORAL_TABLET | Freq: Every day | ORAL | Status: DC

## 2013-11-20 MED ORDER — BUSPIRONE HCL 7.5 MG PO TABS: 7.5000 mg | ORAL_TABLET | Freq: Three times a day (TID) | ORAL | Status: DC

## 2013-11-20 MED ORDER — PHENTERMINE HCL 37.5 MG PO TABS: 37.5000 mg | ORAL_TABLET | Freq: Every day | ORAL | Status: DC

## 2013-11-20 NOTE — Assessment & Plan Note (Signed)
Unchanged, use varies with stress level also. Patient counseled for approximately 5 minutes regarding the health risks of ongoing nicotine use, specifically all types of cancer, heart disease, stroke and respiratory failure. The options available for help with cessation ,the behavioral changes to assist the process, and the option to either gradully reduce usage  Or abruptly stop.is also discussed. Pt is also encouraged to set specific goals in number of cigarettes used daily, as well as to set a quit date.

## 2013-11-20 NOTE — Assessment & Plan Note (Signed)
Resolved, reports being in a good relationship currently

## 2013-11-20 NOTE — Assessment & Plan Note (Signed)
Improved somewhat on buspar , however , recent episode on the job has her feeling as though not fully controlled, espescialy perimenstrualy. Increase buspar dose and add daily fluoxetine, having menses twice per month, each time for approx 1 week lifestyl modificatio including daily exercise, and techniques to de stress esp in stressful situations are also discussed

## 2013-11-20 NOTE — Assessment & Plan Note (Signed)
Deteriorated. Patient re-educated about  the importance of commitment to a  minimum of 150 minutes of exercise per week. The importance of healthy food choices with portion control discussed. Encouraged to start a food diary, count calories and to consider  joining a support group. Sample diet sheets offered. Goals set by the patient for the next several months. She is to resume daily phentermine

## 2013-11-20 NOTE — Progress Notes (Signed)
Subjective:    Patient ID: Zoe Bailey, female    DOB: 04-Aug-1965, 48 y.o.   MRN: [id number]  HPI The PT is here for follow up and re-evaluation of chronic medical conditions, medication management and review of any available recent lab and radiology data.  Preventive health is updated, specifically  Cancer screening and Immunization.   Questions or concerns regarding consultations or procedures which the PT has had in the interim are  Addressed.Gyne surgery recommended for multiple periods each month , however , states that she was unable to afford the procedure, so has not had it yet The PT denies any adverse reactions to current medications since the last visit.  Anxiety slightly better, but recent episode on the job makes her uncertain as to how well controlled she is . 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 Does note more mood instability, esp as she bleed on avg 3 out of 4 weeks per month Nicotine dependence is still present though trying to reduce, turns to smoking for stress relief still too much Insufficient control re diet with weight gain, wants to resume phentermine, will increase exercise 3 week h/o increased back spasm good response to flexeril    Review of Systems See HPI Denies recent fever or chills. Denies sinus pressure, nasal congestion, ear pain or sore throat. Denies chest congestion, productive cough or wheezing. Denies chest pains, palpitations and leg swelling Denies abdominal pain, nausea, vomiting,diarrhea or constipation.   Denies dysuria, frequency, hesitancy or incontinence.  Denies uncontrolled  Headaches,denies  seizures, numbness, or tingling.  Denies skin break down or rash.        Objective:   Physical Exam BP 102/64  Pulse 88  Resp 18  Ht 5\' 1"  (1.549 m)  Wt 166 lb 1.9 oz (75.352 kg)  BMI 31.40 kg/m2  SpO2 100% Patient  alert and oriented and in no cardiopulmonary distress.  HEENT: No facial asymmetry, EOMI,   oropharynx pink and moist.  Neck supple no JVD, no mass.  Chest: Clear to auscultation bilaterally.  CVS: S1, S2 no murmurs, no S3.Regular rate.  ABD: Soft non tender.   Ext: No edema  MS: Adequate ROM spine, shoulders, hips and knees.  Skin: Intact, no ulcerations or rash noted.  Psych: Good eye contact, normal affect. Memory intact not as  Anxious as in the past, not  depressed appearing.  CNS: CN 2-12 intact, power,  normal throughout.no focal deficits noted.        Assessment & Plan:  GAD (generalized anxiety disorder) Improved somewhat on buspar , however , recent episode on the job has her feeling as though not fully controlled, espescialy perimenstrualy. Increase buspar dose and add daily fluoxetine, having menses twice per month, each time for approx 1 week lifestyl modificatio including daily exercise, and techniques to de stress esp in stressful situations are also discussed  NICOTINE ADDICTION Unchanged, use varies with stress level also. Patient counseled for approximately 5 minutes regarding the health risks of ongoing nicotine use, specifically all types of cancer, heart disease, stroke and respiratory failure. The options available for help with cessation ,the behavioral changes to assist the process, and the option to either gradully reduce usage  Or abruptly stop.is also discussed. Pt is also encouraged to set specific goals in number of cigarettes used daily, as well as to set a quit date.   OBESITY  Deteriorated. Patient re-educated about  the importance of commitment to a  minimum of 150 minutes of exercise per week. The importance of healthy food choices with portion control discussed. Encouraged to start a food diary, count calories and to consider  joining a support group. Sample diet sheets offered. Goals set by the patient for the next several months. She is  to resume daily phentermine     Polymenorrhea Has had gyne eval, unable to afford co pay for definitive treatment at this time, so unchanged, also having seemingly increased mood instability, some of which is hormonally aggravated  DEPRESSION Resolved, reports being in a good relationship currently  MIGRAINE HEADACHE Good prophylaxis with gabapentin, also uses zanaflex with good effect for acute headache  Back spasm acute onset x 3 weeks, responds to flexeril as needed, continue same

## 2013-11-20 NOTE — Patient Instructions (Addendum)
F/u in 3 month, call if you need me before  New medication daily for PMS and anxiety, fluoxetine 10mg h  Increase dose of buspar to three times daily  Practice techniques we discussed to take control of unpleasant situations and not have them take control of you  Commit to 30 to 45 mins exercise daily and cutting back on cigarettes with a plan to quit  Resume once daily phentermine, expectation is for a 6 to 9 pound weight loss in the next 3 months  Fasting labs in 3 months, before follow up, cBC, lipid, chem 7 and TSH  Premenstrual Syndrome Premenstrual syndrome (PMS) is a condition that consists of physical, emotional, and behavioral symptoms that affect women of childbearing age. PMS occurs 5-14 days before the start of a menstrual period and often recurs in a predictable pattern. The symptoms go away a few days after the menstrual period starts. PMS can interfere in many ways with normal daily activities and can range from mild to severe. When PMS is considered severe, it may be diagnosed as premenstrual dysphoric disorder (PMDD). A small percentage of women are affected by PMS symptoms and an even smaller percentage of those women are affected by PMDD.  CAUSES  The exact cause of PMS is unknown, but it seems to be related to cyclic hormone changes that happen before menstruation. These hormones are thought to affect chemicals in the brain (serotonin) that can influence a person's mood.  SYMPTOMS  Symptoms of PMS recur consistently from month to month and go away completely after the menstrual period starts. The most common emotional or behavioral symptom is mood swings. These mood swings can be disabling and interfere with normal activities of daily living. Other common symptoms include depression and angry outbursts. Other symptoms may include:   Irritability.  Anxiety.  Crying spells.   Food cravings or appetite changes.   Changes in sexual desire.   Confusion.    Aggression.   Social withdrawal.   Poor concentration. The most common physical symptoms include a sense of bloating, breast pain, headaches, and extreme fatigue. Other physical symptoms include:   Backaches.   Swelling of the hands and feet.   Weight gain.   Hot flashes.  DIAGNOSIS  To make a diagnosis, your caregiver will ask questions to confirm that you are having a pattern of symptoms. Symptoms must:   Be present 5 days before the start of your period and be present at least 3 months in a row.   End within 4 days after your period starts.   Interfere with some of your normal activities.  Other conditions, such as thyroid disease, depression, and migraine headaches must be ruled out before a diagnosis of PMS is confirmed.  TREATMENT  Your caregiver may suggest ways to maintain a healthy lifestyle, such as exercise. Over-the-counter pain relievers may ease cramps, aches, pains, headaches, and breast tenderness. However, selective serotonin reuptake inhibitors (SSRIs) are medicines that are most beneficial in improving PMS if taken in the second half of the monthly cycle. They may be taken on a daily basis. The most effective oral contraceptive pill used for symptoms of PMS is one that contains the ingredient drospirenone. Taking 4 days off of the pill instead of the usual 7 days also has shown to increase effectiveness.  There are a number of drugs, dietary supplements, vitamins, and water pills (diuretics) which have been suggested to be helpful but have not shown to be of any benefit to improving PMS symptoms.  HOME CARE INSTRUCTIONS   For 2-3 months, write down your symptoms, their severity, and how long they last. This may help your caregiver prescribe the best treatment for your symptoms.  Exercise regularly as suggested by your caregiver.  Eat a regular, well-balanced diet.  Avoid caffeine, alcohol, and tobacco consumption.  Limit salt and salty foods to  lessen bloating and fluid retention.  Get enough sleep. Practice relaxation techniques.  Drink enough fluids to keep your urine clear or pale yellow.  Take medicines as directed by your caregiver.  Limit stress.  Take a multivitamin as directed by your caregiver. Document Released: 02/21/2000 Document Revised: 11/18/2011 Document Reviewed: 07/13/2011 [facility] Patient Information 2015 [place], [place]. This information is not intended to replace advice given to you by your health care provider. Make sure you discuss any questions you have with your health care provider.

## 2013-11-20 NOTE — Assessment & Plan Note (Signed)
acute onset x 3 weeks, responds to flexeril as needed, continue same

## 2013-11-20 NOTE — Assessment & Plan Note (Signed)
Good prophylaxis with gabapentin, also uses zanaflex with good effect for acute headache

## 2013-11-20 NOTE — Assessment & Plan Note (Signed)
Has had gyne eval, unable to afford co pay for definitive treatment at this time, so unchanged, also having seemingly increased mood instability, some of which is hormonally aggravated

## 2014-01-08 ENCOUNTER — Other Ambulatory Visit: Payer: Self-pay | Admitting: Family Medicine

## 2014-01-08 ENCOUNTER — Encounter: Payer: Self-pay | Admitting: Family Medicine

## 2014-02-19 ENCOUNTER — Ambulatory Visit (INDEPENDENT_AMBULATORY_CARE_PROVIDER_SITE_OTHER): Payer: 59 | Admitting: Family Medicine

## 2014-02-19 ENCOUNTER — Encounter: Payer: Self-pay | Admitting: Family Medicine

## 2014-02-19 VITALS — BP 126/78 | HR 66 | Resp 18 | Ht 61.0 in | Wt 166.0 lb

## 2014-02-19 DIAGNOSIS — Z13220 Encounter for screening for lipoid disorders: Secondary | ICD-10-CM

## 2014-02-19 DIAGNOSIS — F17200 Nicotine dependence, unspecified, uncomplicated: Secondary | ICD-10-CM

## 2014-02-19 DIAGNOSIS — S81812A Laceration without foreign body, left lower leg, initial encounter: Secondary | ICD-10-CM | POA: Insufficient documentation

## 2014-02-19 DIAGNOSIS — Z23 Encounter for immunization: Secondary | ICD-10-CM | POA: Insufficient documentation

## 2014-02-19 DIAGNOSIS — F411 Generalized anxiety disorder: Secondary | ICD-10-CM

## 2014-02-19 DIAGNOSIS — R238 Other skin changes: Secondary | ICD-10-CM

## 2014-02-19 DIAGNOSIS — Z1329 Encounter for screening for other suspected endocrine disorder: Secondary | ICD-10-CM

## 2014-02-19 DIAGNOSIS — R233 Spontaneous ecchymoses: Secondary | ICD-10-CM

## 2014-02-19 DIAGNOSIS — Z720 Tobacco use: Secondary | ICD-10-CM

## 2014-02-19 DIAGNOSIS — F418 Other specified anxiety disorders: Secondary | ICD-10-CM

## 2014-02-19 DIAGNOSIS — G43109 Migraine with aura, not intractable, without status migrainosus: Secondary | ICD-10-CM

## 2014-02-19 DIAGNOSIS — E669 Obesity, unspecified: Secondary | ICD-10-CM

## 2014-02-19 MED ORDER — CEPHALEXIN 500 MG PO CAPS: 500.0000 mg | ORAL_CAPSULE | Freq: Four times a day (QID) | ORAL | Status: DC

## 2014-02-19 NOTE — Assessment & Plan Note (Addendum)
Open area which has bacterial super infection,cleaned and dry gauze applied loossely, keflex prescribed for 1 week

## 2014-02-19 NOTE — Assessment & Plan Note (Signed)
Unchanged. Patient counseled for approximately 5 minutes regarding the health risks of ongoing nicotine use, specifically all types of cancer, heart disease, stroke and respiratory failure. The options available for help with cessation ,the behavioral changes to assist the process, and the option to either gradully reduce usage  Or abruptly stop.is also discussed. Pt is also encouraged to set specific goals in number of cigarettes used daily, as well as to set a quit date.  

## 2014-02-19 NOTE — Assessment & Plan Note (Signed)
Controlled, no change in medication  

## 2014-02-19 NOTE — Patient Instructions (Addendum)
F/u in 4. month, call if you need me before  Tdaap today.  Keep weound clean and dry, use dry gauze rather than band aid  Keflex prescribed to treat infection  Fasting labs this week please  Please wotrk on smoking cessation  You are referred to hematologist about easy bruising and excessive bleeding for Feb appt (at [facility])  Mammogram due 12/24 opr after please sched  All the best for 2016   You have gained weight, start a gym in [place] and change eating habits

## 2014-02-19 NOTE — Assessment & Plan Note (Signed)
Deteriorated. Patient re-educated about  the importance of commitment to a  minimum of 150 minutes of exercise per week. The importance of healthy food choices with portion control discussed. Encouraged to start a food diary, count calories and to consider  joining a support group. Sample diet sheets offered. Goals set by the patient for the next several months.    

## 2014-02-19 NOTE — Assessment & Plan Note (Signed)
Vaccine administered at visit.  

## 2014-02-19 NOTE — Progress Notes (Signed)
   Subjective:    Patient ID: Zoe Bailey, female    DOB: 1965-07-18, 48 y.o.   MRN: [id number]  HPI Accidentally fell on blades of tractor in her back yard belonging to neighbor on 12/09, skin still off with bleeding and purulent looking area, no fever or chills, also c/OBJECTIVE: increased easy bruising and bleeding with slow wound healing Mental health is currently food Still smokes 1PPD, trying to quit but varies with what's going on in her life No regular exercise, unfortunately with weight gain    Review of Systems See HPI Denies recent fever or chills. Denies sinus pressure, nasal congestion, ear pain or sore throat. Denies chest congestion, productive cough or wheezing. Denies chest pains, palpitations and leg swelling Denies abdominal pain, nausea, vomiting,diarrhea or constipation.   Denies dysuria, frequency, hesitancy or incontinence. Denies joint pain, swelling and limitation in mobility. Denies headaches, seizures, numbness, or tingling. Denies uncontrolled  depression, anxiety or insomnia.         Objective:   Physical Exam  BP 126/78 mmHg  Pulse 66  Resp 18  Ht 5\' 1"  (1.549 m)  Wt 166 lb (75.297 kg)  BMI 31.38 kg/m2  SpO2 98% Patient alert and oriented and in no cardiopulmonary distress.  HEENT: No facial asymmetry, EOMI,   oropharynx pink and moist.  Neck supple no JVD, no mass.  Chest: Clear to auscultation bilaterally.  CVS: S1, S2 no murmurs, no S3.Regular rate.  ABD: Soft non tender.   Ext: No edema  MS: Adequate ROM spine, shoulders, hips and knees.  Skin: approx 3cm open laceration on left anterior shin with purulent drainage and erythema, scant, also open area on left forearm in area of recent trauma also  Psych: Good eye contact, normal affect. Memory intact not anxious or depressed appearing.  CNS: CN 2-12 intact, power,  normal throughout.no focal deficits noted.       Assessment & Plan:  Laceration of leg, left Open area which  has bacterial super infection,cleaned and dry gauze applied loossely, keflex prescribed for 1 week  Need for Tdap vaccination Vaccine administered at visit.   Migraine Controlled, no change in medication Good prophylaxis with gabapentin, and good response to zanaflex when needed for acute pain  Easy bruising Notes progressively worsening easy  Bruising, has d/c goody powder  For over 1 year and is nottaking aspirin or anti nflammatory  rgulalrly Will check platelet , Pt/PTT and refer to hematology for feb appt per her request  GAD (generalized anxiety disorder) Controlled, no change in medication   Depression with anxiety Controlled, no change in medication   Obesity (BMI 30.0-34.9) Deteriorated. Patient re-educated about  the importance of commitment to a  minimum of 150 minutes of exercise per week. The importance of healthy food choices with portion control discussed. Encouraged to start a food diary, count calories and to consider  joining a support group. Sample diet sheets offered. Goals set by the patient for the next several months.     NICOTINE ADDICTION Unchanged Patient counseled for approximately 5 minutes regarding the health risks of ongoing nicotine use, specifically all types of cancer, heart disease, stroke and respiratory failure. The options available for help with cessation ,the behavioral changes to assist the process, and the option to either gradully reduce usage  Or abruptly stop.is also discussed. Pt is also encouraged to set specific goals in number of cigarettes used daily, as well as to set a quit date.

## 2014-02-19 NOTE — Assessment & Plan Note (Signed)
Notes progressively worsening easy  Bruising, has d/c goody powder  For over 1 year and is nottaking aspirin or anti nflammatory  rgulalrly Will check platelet , Pt/PTT and refer to hematology for feb appt per her request

## 2014-02-19 NOTE — Assessment & Plan Note (Signed)
Controlled, no change in medication Good prophylaxis with gabapentin, and good response to zanaflex when needed for acute pain

## 2014-02-22 ENCOUNTER — Other Ambulatory Visit: Payer: Self-pay | Admitting: Family Medicine

## 2014-02-22 DIAGNOSIS — Z1231 Encounter for screening mammogram for malignant neoplasm of breast: Secondary | ICD-10-CM

## 2014-02-28 ENCOUNTER — Other Ambulatory Visit: Payer: Self-pay | Admitting: Family Medicine

## 2014-03-07 ENCOUNTER — Ambulatory Visit (HOSPITAL_COMMUNITY)
Admission: RE | Admit: 2014-03-07 | Discharge: 2014-03-07 | Disposition: A | Discharge status: Home / Self Care | Payer: 59 | Source: Ambulatory Visit | Attending: Family Medicine | Admitting: Family Medicine

## 2014-03-07 DIAGNOSIS — Z1231 Encounter for screening mammogram for malignant neoplasm of breast: Secondary | ICD-10-CM | POA: Insufficient documentation

## 2014-03-09 DIAGNOSIS — M79671 Pain in right foot: Secondary | ICD-10-CM

## 2014-03-09 HISTORY — DX: Pain in right foot: M79.671

## 2014-03-14 ENCOUNTER — Ambulatory Visit (HOSPITAL_COMMUNITY): Payer: 59 | Admitting: Hematology & Oncology

## 2014-03-14 ENCOUNTER — Encounter: Payer: Self-pay | Admitting: Family Medicine

## 2014-03-14 LAB — CBC WITH DIFFERENTIAL/PLATELET
BASOS ABS: 0 10*3/uL (ref 0.0–0.1)
Basophils Relative: 0 % (ref 0–1)
Eosinophils Absolute: 0.1 10*3/uL (ref 0.0–0.7)
Eosinophils Relative: 1 % (ref 0–5)
HCT: 44.5 % (ref 36.0–46.0)
Hemoglobin: 14.6 g/dL (ref 12.0–15.0)
Lymphocytes Relative: 28 % (ref 12–46)
Lymphs Abs: 2.8 10*3/uL (ref 0.7–4.0)
MCH: 30.5 pg (ref 26.0–34.0)
MCHC: 32.8 g/dL (ref 30.0–36.0)
MCV: 93.1 fL (ref 78.0–100.0)
Monocytes Absolute: 0.4 10*3/uL (ref 0.1–1.0)
Monocytes Relative: 4 % (ref 3–12)
NEUTROS ABS: 6.8 10*3/uL (ref 1.7–7.7)
NEUTROS PCT: 67 % (ref 43–77)
Platelets: 269 10*3/uL (ref 150–400)
RBC: 4.78 MIL/uL (ref 3.87–5.11)
RDW: 13.4 % (ref 11.5–15.5)
WBC: 10.1 10*3/uL (ref 4.0–10.5)

## 2014-03-14 LAB — BASIC METABOLIC PANEL
BUN: 12 mg/dL (ref 6–23)
CALCIUM: 8.9 mg/dL (ref 8.4–10.5)
CO2: 27 mEq/L (ref 19–32)
CREATININE: 0.69 mg/dL (ref 0.50–1.10)
Chloride: 104 mEq/L (ref 96–112)
GLUCOSE: 98 mg/dL (ref 70–99)
POTASSIUM: 4.1 meq/L (ref 3.5–5.3)
Sodium: 136 mEq/L (ref 135–145)

## 2014-03-14 LAB — LIPID PANEL
CHOLESTEROL: 199 mg/dL (ref 0–200)
HDL: 71 mg/dL (ref >39)
LDL Cholesterol: 116 mg/dL — ABNORMAL HIGH (ref 0–99)
Total CHOL/HDL Ratio: 2.8 Ratio
Triglycerides: 58 mg/dL (ref <150)
VLDL: 12 mg/dL (ref 0–40)

## 2014-03-14 LAB — TSH: TSH: 2.245 u[IU]/mL (ref 0.350–4.500)

## 2014-03-19 ENCOUNTER — Other Ambulatory Visit: Payer: Self-pay

## 2014-03-19 MED ORDER — FLUCONAZOLE 150 MG PO TABS: 150.0000 mg | ORAL_TABLET | Freq: Once | ORAL | Status: DC

## 2014-03-20 ENCOUNTER — Other Ambulatory Visit: Payer: Self-pay | Admitting: Family Medicine

## 2014-03-26 ENCOUNTER — Encounter (HOSPITAL_COMMUNITY): Payer: Self-pay | Admitting: Hematology & Oncology

## 2014-03-26 ENCOUNTER — Encounter (HOSPITAL_COMMUNITY): Payer: 59 | Attending: Hematology & Oncology | Admitting: Hematology & Oncology

## 2014-03-26 VITALS — BP 139/84 | HR 105 | Temp 98.8°F | Resp 16 | Ht 62.0 in | Wt 162.5 lb

## 2014-03-26 DIAGNOSIS — R51 Headache: Secondary | ICD-10-CM | POA: Diagnosis not present

## 2014-03-26 DIAGNOSIS — R0602 Shortness of breath: Secondary | ICD-10-CM | POA: Insufficient documentation

## 2014-03-26 DIAGNOSIS — Z85828 Personal history of other malignant neoplasm of skin: Secondary | ICD-10-CM | POA: Diagnosis not present

## 2014-03-26 DIAGNOSIS — R238 Other skin changes: Secondary | ICD-10-CM | POA: Diagnosis not present

## 2014-03-26 DIAGNOSIS — R233 Spontaneous ecchymoses: Secondary | ICD-10-CM

## 2014-03-26 DIAGNOSIS — M2550 Pain in unspecified joint: Secondary | ICD-10-CM | POA: Diagnosis not present

## 2014-03-26 LAB — PLATELET FUNCTION ASSAY: COLLAGEN / EPINEPHRINE: 150 s (ref 0–197)

## 2014-03-26 NOTE — Patient Instructions (Signed)
[place] at [facility]  Discharge Instructions:  I will see you back after your labs are back to review the results If you have any problems prior to follow-up please call _______________________________________________________________  Thank you for choosing [place] at [facility] to provide your oncology and hematology care.  To afford each patient quality time with our providers, please arrive at least 15 minutes before your scheduled appointment.  You need to re-schedule your appointment if you arrive 10 or more minutes late.  We strive to give you quality time with our providers, and arriving late affects you and other patients whose appointments are after yours.  Also, if you no show three or more times for appointments you may be dismissed from the clinic.  Again, thank you for choosing [place] at [facility] hope is that these requests will allow you access to exceptional care and in a timely manner. _______________________________________________________________  If you have questions after your visit, please contact our office at [phone] between the hours of 8:30 a.m. and 5:00 p.m. Voicemails left after 4:30 p.m. will not be returned until the following business day. _______________________________________________________________  For prescription refill requests, have your pharmacy contact our office. _______________________________________________________________  Recommendations made by the consultant and any test results will be sent to your referring physician. _______________________________________________________________

## 2014-03-26 NOTE — Progress Notes (Signed)
Zoe Bailey presented for [employer]. Labs per MD order drawn via Peripheral Line 23 gauge needle inserted in left antecubital.  Good blood return present. Procedure without incident.  Needle removed intact. Patient tolerated procedure well.

## 2014-03-26 NOTE — Progress Notes (Signed)
[place] CONSULT NOTE  Patient Care Team: Fayrene Helper, MD as PCP - General  CHIEF COMPLAINTS/PURPOSE OF CONSULTATION:  Easy Bruising  HISTORY OF PRESENTING ILLNESS:  Zoe Bailey 49 y.o. female is here because of easy bruising. Used to take [employer] about 18/day for several years for headaches.  She has a history of migraines and is now currently on Botox. She has stopped the goodies powders about 9 months ago. She noticed her skin would tear and bleed easily. She noticed an increase in bruising about a year ago.   She fell a mont ago over a tractor blade and sustained multiple large bruises on both legs.  She states she also bleeds easily.  Last evening she just tapped the edge of the kitchen table and has a 3 cm bruise on the leg.   She takes intermittent ibuprofen for joint pain. She has not taken any in over 1 month.  She denies any gum bleeding at the dentist. She goes for routine cleanings.   She has an IUD. She reports heavy cycles.     No history exists.     MEDICAL HISTORY:  Past Medical History  Diagnosis Date  . Depression   . Cancer 2010 approx    skin cancer  . Migraine   . Toenail fungus   Carpel Tunnel  SURGICAL HISTORY: Past Surgical History  Procedure Laterality Date  . Carpal tunnel release    . Hernia repair    . Polypectomy    . Ankle fracture surgery Right     SOCIAL HISTORY: History   Social History  . Marital Status: Divorced    Spouse Name: N/A    Number of Children: 1  . Years of Education: college   Occupational History  . 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 Smoking status: Current Every Day Smoker -- 1.00 packs/day  . Smokeless tobacco: Not on file  . Alcohol Use: No  . Drug Use: No  . Sexual Activity: Not on file   Other Topics Concern  . Not on file   Social History Narrative   Divorced. Lives alone. Has 2 dogs    FAMILY HISTORY: Family History  Problem Relation Age  of Onset  . Arthritis    . Cancer     indicated that her mother is alive. She indicated that her father is alive. She indicated that her daughter is alive.   Father is 10 and healthy Mother is 68 has COPD, chronic vertigo, osteoporosis.   ALLERGIES:  is allergic to effexor.  MEDICATIONS:  Current Outpatient Prescriptions  Medication Sig Dispense Refill  . Biotin 1000 MCG tablet Take 1,000 mcg by mouth daily. 5 tablets (5000 mcg) once daily    . busPIRone (BUSPAR) 7.5 MG tablet Take 1 tablet (7.5 mg total) by mouth 3 (three) times daily. 90 tablet 4  . cyclobenzaprine (FLEXERIL) 10 MG tablet TAKE 1 TABLET BY MOUTH 3 TIMES DAILY AS NEEDED. 90 tablet 1  . FLUoxetine (PROZAC) 10 MG tablet Take 10 mg by mouth daily.  1  . gabapentin (NEURONTIN) 800 MG tablet Take 1 tablet (800 mg total) by mouth 3 (three) times daily. (Patient taking differently: Take 800 mg by mouth 2 (two) times daily. ) 270 tablet 1  . Misc Natural Products (OSTEO BI-FLEX ADV TRIPLE ST PO) Take 1 tablet by mouth 2 (two) times daily.    . phentermine (ADIPEX-P) 37.5 MG tablet TAKE 1  TABLET BY MOUTH ONCE DAILY BEFORE BREAKFAST 30 tablet 2  . tiZANidine (ZANAFLEX) 4 MG capsule Take 4 mg by mouth 2 (two) times daily as needed for muscle spasms. Max of 2 times weekly     No current facility-administered medications for this visit.    Review of Systems  Constitutional: Negative.   HENT: Negative for congestion, ear discharge, ear pain, hearing loss, nosebleeds, sore throat and tinnitus.   Eyes: Negative for blurred vision, double vision, photophobia, pain, discharge and redness.       Wears glasses, saw eye doctor in 02/2013  Respiratory: Positive for shortness of breath. Negative for cough, sputum production, wheezing and stridor.   Cardiovascular: Negative for chest pain, palpitations, orthopnea, claudication and leg swelling.  Gastrointestinal: Negative for heartburn, nausea, vomiting, abdominal pain, diarrhea,  constipation, blood in stool and melena.  Genitourinary: Negative for dysuria, urgency, frequency, hematuria and flank pain.  Musculoskeletal: Positive for joint pain. Negative for myalgias, back pain, falls and neck pain.  Skin: Negative.        Bruising  Neurological: Positive for headaches. Negative for dizziness, tingling, tremors, sensory change, speech change, focal weakness, seizures and loss of consciousness.  Endo/Heme/Allergies: Negative for environmental allergies and polydipsia. Bruises/bleeds easily.  Psychiatric/Behavioral: Negative.     PHYSICAL EXAMINATION: ECOG PERFORMANCE STATUS: 0 - Asymptomatic  Filed Vitals:   03/26/14 1400  BP: 139/84  Pulse: 105  Temp: 98.8 F (37.1 C)  Resp: 16   Filed Weights   03/26/14 1400  Weight: 162 lb 8 oz (73.71 kg)     Physical Exam  Constitutional: She is oriented to person, place, and time and well-developed, well-nourished, and in no distress.  HENT:  Head: Normocephalic and atraumatic.  Nose: Nose normal.  Mouth/Throat: Oropharynx is clear and moist. No oropharyngeal exudate.  Eyes: Conjunctivae and EOM are normal. Pupils are equal, round, and reactive to light. Right eye exhibits no discharge. Left eye exhibits no discharge. No scleral icterus.  Neck: Normal range of motion. Neck supple. No tracheal deviation present. No thyromegaly present.  Cardiovascular: Normal rate, regular rhythm and normal heart sounds.  Exam reveals no gallop and no friction rub.   No murmur heard. Pulmonary/Chest: Effort normal and breath sounds normal. She has no wheezes. She has no rales.  Abdominal: Soft. Bowel sounds are normal. She exhibits no distension and no mass. There is no tenderness. There is no rebound and no guarding.  Musculoskeletal: Normal range of motion. She exhibits no edema.  Lymphadenopathy:    She has no cervical adenopathy.  Neurological: She is alert and oriented to person, place, and time. She has normal reflexes. No  cranial nerve deficit. Gait normal. Coordination normal.  Skin: Skin is warm and dry. No rash noted.  One 3 cm bruise anterior R upper thigh  Psychiatric: Mood, memory, affect and judgment normal.  Nursing note and vitals reviewed.    LABORATORY DATA:  I have reviewed the data as listed Lab Results  Component Value Date   WBC 10.1 03/14/2014   HGB 14.6 03/14/2014   HCT 44.5 03/14/2014   MCV 93.1 03/14/2014   PLT 269 03/14/2014     Chemistry      Component Value Date/Time   NA 136 03/14/2014 0849   K 4.1 03/14/2014 0849   CL 104 03/14/2014 0849   CO2 27 03/14/2014 0849   BUN 12 03/14/2014 0849   CREATININE 0.69 03/14/2014 0849   CREATININE 0.63 10/09/2009 2211      Component  Value Date/Time   CALCIUM 8.9 03/14/2014 0849   ALKPHOS 96 05/15/2011 0820   AST 17 05/15/2011 0820   ALT 12 05/15/2011 0820   BILITOT 0.4 05/15/2011 0820       ASSESSMENT & PLAN:  Easy bruising Pleasant 49 year old female who reports easy bruising. She states she merely taps "something" in bruises. She had a history of heavy Goody powder use for headaches in the past. She stopped these about 9 months ago. She did notice an improvement in bruising and bleeding after that. She does take intermittent ibuprofen for joint pain but states she has avoided that for over a month.  Does not complain of nose bleeding nor gum bleeding. She routinely follows with her dentist. She recently fell over tractor blade, sustain large bruises to both legs. I advised her that based on the nature of her fall I do not feel this was abnormal.  I have a low suspicion that she has abnormal platelet function. I have however recommended checking a platelet function test or a PFA-100. I advised her I will see her back once the results are available to make any additional recommendations. CBC was recently performed and shows normal platelet numbers. I will see her back as detailed    Orders Placed This Encounter  Procedures   . Platelet Function Analysis  . Platelet function assay    All questions were answered. The patient knows to call the clinic with any problems, questions or concerns.     Molli Hazard, MD MD 03/27/2014 7:43 AM

## 2014-03-27 ENCOUNTER — Encounter (HOSPITAL_COMMUNITY): Payer: Self-pay | Admitting: Hematology & Oncology

## 2014-03-27 NOTE — Assessment & Plan Note (Signed)
Pleasant 49 year old female who reports easy bruising. She states she merely taps "something" in bruises. She had a history of heavy Goody powder use for headaches in the past. She stopped these about 9 months ago. She did notice an improvement in bruising and bleeding after that. She does take intermittent ibuprofen for joint pain but states she has avoided that for over a month.  Does not complain of nose bleeding nor gum bleeding. She routinely follows with her dentist. She recently fell over tractor blade, sustain large bruises to both legs. I advised her that based on the nature of her fall I do not feel this was abnormal.  I have a low suspicion that she has abnormal platelet function. I have however recommended checking a platelet function test or a PFA-100. I advised her I will see her back once the results are available to make any additional recommendations. CBC was recently performed and shows normal platelet numbers. I will see her back as detailed

## 2014-04-12 ENCOUNTER — Encounter (HOSPITAL_COMMUNITY): Payer: Self-pay | Admitting: Hematology & Oncology

## 2014-04-12 ENCOUNTER — Encounter (HOSPITAL_COMMUNITY): Payer: 59 | Attending: Hematology & Oncology | Admitting: Hematology & Oncology

## 2014-04-12 VITALS — BP 118/74 | HR 76 | Temp 98.6°F | Resp 16 | Wt 159.7 lb

## 2014-04-12 DIAGNOSIS — R0602 Shortness of breath: Secondary | ICD-10-CM | POA: Diagnosis not present

## 2014-04-12 DIAGNOSIS — R238 Other skin changes: Secondary | ICD-10-CM | POA: Diagnosis present

## 2014-04-12 DIAGNOSIS — M2550 Pain in unspecified joint: Secondary | ICD-10-CM | POA: Insufficient documentation

## 2014-04-12 DIAGNOSIS — R51 Headache: Secondary | ICD-10-CM | POA: Insufficient documentation

## 2014-04-12 DIAGNOSIS — Z85828 Personal history of other malignant neoplasm of skin: Secondary | ICD-10-CM | POA: Insufficient documentation

## 2014-04-12 DIAGNOSIS — R233 Spontaneous ecchymoses: Secondary | ICD-10-CM

## 2014-04-12 LAB — PLATELET FUNCTION ASSAY: Collagen / Epinephrine: 114 seconds (ref 0–197)

## 2014-04-12 NOTE — Patient Instructions (Addendum)
[place] at [facility] Discharge Instructions  RECOMMENDATIONS MADE BY THE CONSULTANT AND ANY TEST RESULTS WILL BE SENT TO YOUR REFERRING PHYSICIAN.  Will check some additional blood work today and will contact you if there are any issues or concern. Start Vitamin C and take 500 mg daily. Call with any issues or concerns.  Follow-up in 4 weeks.  Thank you for choosing [place] at [facility] to provide your oncology and hematology care.  To afford each patient quality time with our provider, please arrive at least 15 minutes before your scheduled appointment time.    You need to re-schedule your appointment should you arrive 10 or more minutes late.  We strive to give you quality time with our providers, and arriving late affects you and other patients whose appointments are after yours.  Also, if you no show three or more times for appointments you may be dismissed from the clinic at the providers discretion.     Again, thank you for choosing [facility].  Our hope is that these requests will decrease the amount of time that you wait before being seen by our physicians.       _____________________________________________________________  Should you have questions after your visit to [facility], please contact our office at [phone] between the hours of 8:30 a.m. and 4:30 p.m.  Voicemails left after 4:30 p.m. will not be returned until the following business day.  For prescription refill requests, have your pharmacy contact our office.

## 2014-04-12 NOTE — Progress Notes (Signed)
Zoe Bailey presented for [employer]. Labs per MD order drawn via Peripheral Line 21 gauge needle inserted in left AC.  Good blood return present. Procedure without incident.  Needle removed intact. Patient became diaphoretic and dizzy.  Cool cloth applied and cold beverage given. BP 118/74  P76 Patient tolerated procedure well.

## 2014-04-13 NOTE — Assessment & Plan Note (Signed)
49 year old female who presented with a history of easy bruising. He is a principally on the extremities. She has a history of excessive bradycardia powder use but has not taken that in many months. We performed a platelet function test that was normal. Her platelet numbers are normal. She has no mucosal bleeding. Bruising is not on atypical sites, such as the trunk or face. She does take Prozac which can increase bruising risk however she is on a low dose and has only been on this for approximately 6 months. X  On examination today the findings on her lower extremities are more rash-like in appearance, there is a similar appearance on her upper extremities as well. I would like to refer her to dermatology or back to Dr. [place] Crane. I advised her we will repeat the platelet function tests today, we will also add a vitamin K. I instructed her to take vitamin C daily. I will see her back again in several weeks with additional recommendations to follow based upon her laboratory studies. At this point however I do not feel that she has unusual bruising.

## 2014-04-13 NOTE — Progress Notes (Signed)
[facility] CONSULT NOTE  Patient Care Team: Fayrene Helper, MD as PCP - General  CHIEF COMPLAINTS/PURPOSE OF CONSULTATION:  Easy Bruising  HISTORY OF PRESENTING ILLNESS:  Zoe Bailey 49 y.o. female is here because of easy bruising. She has a few small skin tears on her arms and complains of multiple bruises on her LE. She has been using a "fade cream" on her arms and legs.  She has no other complaints today.  She is here for lab review.    MEDICAL HISTORY:  Past Medical History  Diagnosis Date  . Depression   . Cancer 2010 approx    skin cancer  . Migraine   . Toenail fungus   . Foot pain, right 03/2014  Carpel Tunnel  SURGICAL HISTORY: Past Surgical History  Procedure Laterality Date  . Carpal tunnel release    . Hernia repair    . Polypectomy    . Ankle fracture surgery Right     SOCIAL HISTORY: History   Social History  . Marital Status: Divorced    Spouse Name: N/A    Number of Children: 1  . Years of Education: college   Occupational History  . 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 Smoking status: Current Every Day Smoker -- 1.00 packs/day  . Smokeless tobacco: Never Used  . Alcohol Use: No  . Drug Use: No  . Sexual Activity: Not on file   Other Topics Concern  . Not on file   Social History Narrative   Divorced. Lives alone. Has 2 dogs    FAMILY HISTORY: Family History  Problem Relation Age of Onset  . Arthritis    . Cancer     indicated that her mother is alive. She indicated that her father is alive. She indicated that her daughter is alive.   Father is 30 and healthy Mother is 70 has COPD, chronic vertigo, osteoporosis.   ALLERGIES:  is allergic to effexor.  MEDICATIONS:  Current Outpatient Prescriptions  Medication Sig Dispense Refill  . Biotin 1000 MCG tablet Take 1,000 mcg by mouth daily. 5 tablets (5000 mcg) once daily    . busPIRone (BUSPAR) 7.5 MG tablet Take 1 tablet (7.5 mg total) by  mouth 3 (three) times daily. 90 tablet 4  . cyclobenzaprine (FLEXERIL) 10 MG tablet TAKE 1 TABLET BY MOUTH 3 TIMES DAILY AS NEEDED. 90 tablet 1  . FLUoxetine (PROZAC) 10 MG tablet Take 10 mg by mouth daily.  1  . gabapentin (NEURONTIN) 800 MG tablet Take 1 tablet (800 mg total) by mouth 3 (three) times daily. (Patient taking differently: Take 800 mg by mouth 2 (two) times daily. ) 270 tablet 1  . HYDROCODONE BITARTRATE PO Take 1 tablet by mouth 2 (two) times daily as needed.    . Misc Natural Products (OSTEO BI-FLEX ADV TRIPLE ST PO) Take 1 tablet by mouth 2 (two) times daily.    . OnabotulinumtoxinA (BOTOX IJ) Inject as directed every 3 (three) months.    . phentermine (ADIPEX-P) 37.5 MG tablet TAKE 1 TABLET BY MOUTH ONCE DAILY BEFORE BREAKFAST 30 tablet 2  . tiZANidine (ZANAFLEX) 4 MG capsule Take 4 mg by mouth 2 (two) times daily as needed for muscle spasms. Max of 2 times weekly     No current facility-administered medications for this visit.    Review of Systems  Constitutional: Negative for fever, chills, weight loss and malaise/fatigue.  HENT: Negative for congestion, hearing loss,  nosebleeds, sore throat and tinnitus.   Eyes: Negative for blurred vision, double vision, pain and discharge.  Respiratory: Negative for cough, hemoptysis, sputum production, shortness of breath and wheezing.   Cardiovascular: Negative for chest pain, palpitations, claudication, leg swelling and PND.  Gastrointestinal: Negative for heartburn, nausea, vomiting, abdominal pain, diarrhea, constipation, blood in stool and melena.  Genitourinary: Negative for dysuria, urgency, frequency and hematuria.  Musculoskeletal: Negative for myalgias, joint pain and falls.  Skin: Positive for rash. Negative for itching.  Neurological: Negative for dizziness, tingling, tremors, sensory change, speech change, focal weakness, seizures, loss of consciousness, weakness and headaches.  Endo/Heme/Allergies: Bruises/bleeds easily.   Psychiatric/Behavioral: Negative for depression, suicidal ideas, memory loss and substance abuse. The patient is not nervous/anxious and does not have insomnia.     PHYSICAL EXAMINATION: ECOG PERFORMANCE STATUS: 0 - Asymptomatic  Filed Vitals:   04/12/14 1531  BP: 118/74  Pulse: 76  Temp:   Resp:    Filed Weights   04/12/14 1414  Weight: 159 lb 11.2 oz (72.439 kg)     Physical Exam  Constitutional: She is oriented to person, place, and time and well-developed, well-nourished, and in no distress.  HENT:  Head: Normocephalic and atraumatic.  Nose: Nose normal.  Mouth/Throat: Oropharynx is clear and moist. No oropharyngeal exudate.  Eyes: Conjunctivae and EOM are normal. Pupils are equal, round, and reactive to light. Right eye exhibits no discharge. Left eye exhibits no discharge. No scleral icterus.  Neck: Normal range of motion. Neck supple. No tracheal deviation present. No thyromegaly present.  Cardiovascular: Normal rate, regular rhythm and normal heart sounds.  Exam reveals no gallop and no friction rub.   No murmur heard. Pulmonary/Chest: Effort normal and breath sounds normal. She has no wheezes. She has no rales.  Abdominal: Soft. Bowel sounds are normal. She exhibits no distension and no mass. There is no tenderness. There is no rebound and no guarding.  Musculoskeletal: Normal range of motion. She exhibits no edema.  Lymphadenopathy:    She has no cervical adenopathy.  Neurological: She is alert and oriented to person, place, and time. She has normal reflexes. No cranial nerve deficit. Gait normal. Coordination normal.  Skin: Skin is warm and dry. Rash noted.  Palpable erythematous areas on the bilateral LE, no larger than 1cm in diameter.  Appearance is not c/w bruising or other platelet induced skin "rash". RUE with two small areas of "skin tears" palpable rash to the UE as well.   Psychiatric: Mood, memory, affect and judgment normal.  Nursing note and vitals  reviewed.    LABORATORY DATA:  I have reviewed the data as listed Lab Results  Component Value Date   WBC 10.1 03/14/2014   HGB 14.6 03/14/2014   HCT 44.5 03/14/2014   MCV 93.1 03/14/2014   PLT 269 03/14/2014     Chemistry      Component Value Date/Time   NA 136 03/14/2014 0849   K 4.1 03/14/2014 0849   CL 104 03/14/2014 0849   CO2 27 03/14/2014 0849   BUN 12 03/14/2014 0849   CREATININE 0.69 03/14/2014 0849   CREATININE 0.63 10/09/2009 2211      Component Value Date/Time   CALCIUM 8.9 03/14/2014 0849   ALKPHOS 96 05/15/2011 0820   AST 17 05/15/2011 0820   ALT 12 05/15/2011 0820   BILITOT 0.4 05/15/2011 0820       Ref Range 2wk ago    Collagen / Epinephrine 0 - 197 seconds 150  PFA Interpretation     Comments: Platelet function is normal.  If patient history/physical  examination give strong indication  of a bleeding disorder repeat  testing for confirmation.      Results of the test should always be  interpreted in conjunction with the  patient's medical history, clinical  presentation and medication history.  Patients with Hematocrit values <35.0%  or Platelet counts <150,000/uL may result  in values above the Laboratory established  reference range.     Resulting Agency [facility]    Specimen Collected: 03/26/14 3:33 PM     ASSESSMENT & PLAN:  Easy bruising 49 year old female who presented with a history of easy bruising. He is a principally on the extremities. She has a history of excessive bradycardia powder use but has not taken that in many months. We performed a platelet function test that was normal. Her platelet numbers are normal. She has no mucosal bleeding. Bruising is not on atypical sites, such as the trunk or face. She does take Prozac which can increase bruising risk however she is on a low dose and has only been on this for approximately 6 months. X  On examination today the findings on her lower extremities are more rash-like in  appearance, there is a similar appearance on her upper extremities as well. I would like to refer her to dermatology or back to Dr. Nevada Crane. I advised her we will repeat the platelet function tests today, we will also add a vitamin K. I instructed her to take vitamin C daily. I will see her back again in several weeks with additional recommendations to follow based upon her laboratory studies. At this point however I do not feel that she has unusual bruising.    Orders Placed This Encounter  Procedures  . Platelet function assay  . Von Willebrand multimeric  . Vitamin K1, Serum  . Miscellaneous test    Vitamin K level    Standing Status: Future     Number of Occurrences: 1     Standing Expiration Date: 04/12/2015    All questions were answered. The patient knows to call the clinic with any problems, questions or concerns.     Molli Hazard, MD MD 04/13/2014 7:28 AM

## 2014-04-19 LAB — VON WILLEBRAND FACTOR MULTIMER
FACTOR-VIII ACTIVITY: 87 % (ref 50–180)
Ristocetin Co-Factor: 66 % (ref 42–200)
Von Willebrand Factor Ag: 87 % (ref 50–217)

## 2014-04-25 LAB — MISCELLANEOUS TEST: Miscellaneous Test: 80148

## 2014-05-11 ENCOUNTER — Encounter (HOSPITAL_COMMUNITY): Payer: 59 | Attending: Hematology & Oncology | Admitting: Hematology & Oncology

## 2014-05-11 ENCOUNTER — Encounter (HOSPITAL_COMMUNITY): Payer: Self-pay | Admitting: Lab

## 2014-05-11 ENCOUNTER — Encounter (HOSPITAL_COMMUNITY): Payer: Self-pay | Admitting: Hematology & Oncology

## 2014-05-11 VITALS — BP 120/82 | HR 88 | Temp 98.6°F | Resp 16 | Wt 158.6 lb

## 2014-05-11 DIAGNOSIS — R233 Spontaneous ecchymoses: Secondary | ICD-10-CM

## 2014-05-11 DIAGNOSIS — R51 Headache: Secondary | ICD-10-CM | POA: Insufficient documentation

## 2014-05-11 DIAGNOSIS — M2550 Pain in unspecified joint: Secondary | ICD-10-CM | POA: Insufficient documentation

## 2014-05-11 DIAGNOSIS — R238 Other skin changes: Secondary | ICD-10-CM | POA: Diagnosis not present

## 2014-05-11 DIAGNOSIS — Z85828 Personal history of other malignant neoplasm of skin: Secondary | ICD-10-CM | POA: Insufficient documentation

## 2014-05-11 DIAGNOSIS — R0602 Shortness of breath: Secondary | ICD-10-CM | POA: Diagnosis not present

## 2014-05-11 DIAGNOSIS — F418 Other specified anxiety disorders: Secondary | ICD-10-CM

## 2014-05-11 DIAGNOSIS — R21 Rash and other nonspecific skin eruption: Secondary | ICD-10-CM

## 2014-05-11 LAB — SEDIMENTATION RATE: Sed Rate: 14 mm/hr (ref 0–22)

## 2014-05-11 NOTE — Progress Notes (Signed)
Zoe Bailey presented for [employer]. Labs per MD order drawn via Peripheral Line 23 gauge needle inserted in left AC  Good blood return present. Procedure without incident.  Needle removed intact. Patient tolerated procedure well.

## 2014-05-11 NOTE — Progress Notes (Signed)
[facility] CONSULT NOTE  Patient Care Team: Fayrene Helper, MD as PCP - General  CHIEF COMPLAINTS/PURPOSE OF CONSULTATION:  Easy Bruising  HISTORY OF PRESENTING ILLNESS:  Zoe Bailey 49 y.o. female is here because of easy bruising. She describes her symptoms as getting worse. She states she has noticed "areas" on her upper arms chest and face. She went and saw Dr. Nevada Crane, a local dermatologist and he advised her to see [place] again.  She has recently stopped her Botox for her migraines and hydrocodone. She started taking vitamin C 200 mg twice a day, a multivitamin, and vitamin K 100 mg twice a day. She states she was also given a cream by Dr. Nevada Crane that cost $24 over-the-counter.  MEDICAL HISTORY:  Past Medical History  Diagnosis Date  . Depression   . Cancer 2010 approx    skin cancer  . Migraine   . Toenail fungus   . Foot pain, right 03/2014  Carpel Tunnel  SURGICAL HISTORY: Past Surgical History  Procedure Laterality Date  . Carpal tunnel release    . Hernia repair    . Polypectomy    . Ankle fracture surgery Right     SOCIAL HISTORY: History   Social History  . Marital Status: Divorced    Spouse Name: N/A  . Number of Children: 1  . Years of Education: college   Occupational History  . 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 Smoking status: Current Every Day Smoker -- 1.00 packs/day  . Smokeless tobacco: Never Used  . Alcohol Use: No  . Drug Use: No  . Sexual Activity: Not on file   Other Topics Concern  . Not on file   Social History Narrative   Divorced. Lives alone. Has 2 dogs    FAMILY HISTORY: Family History  Problem Relation Age of Onset  . Arthritis    . Cancer     indicated that her mother is alive. She indicated that her father is alive. She indicated that her daughter is alive.   Father is 19 and healthy Mother is 78 has COPD, chronic vertigo, osteoporosis.   ALLERGIES:  is allergic to  effexor.  MEDICATIONS:  Current Outpatient Prescriptions  Medication Sig Dispense Refill  . Ascorbic Acid (VITAMIN C) 100 MG tablet Take 200 mg by mouth 2 (two) times daily.    . Biotin 1000 MCG tablet Take 1,000 mcg by mouth daily. 5 tablets (5000 mcg) once daily    . busPIRone (BUSPAR) 7.5 MG tablet Take 1 tablet (7.5 mg total) by mouth 3 (three) times daily. 90 tablet 4  . cyclobenzaprine (FLEXERIL) 10 MG tablet TAKE 1 TABLET BY MOUTH 3 TIMES DAILY AS NEEDED. 90 tablet 1  . FLUoxetine (PROZAC) 10 MG tablet Take 10 mg by mouth daily.  1  . gabapentin (NEURONTIN) 800 MG tablet Take 1 tablet (800 mg total) by mouth 3 (three) times daily. (Patient taking differently: Take 800 mg by mouth 2 (two) times daily. ) 270 tablet 1  . HYDROCODONE BITARTRATE PO Take 1 tablet by mouth 2 (two) times daily as needed.    . Multiple Vitamin (MULTIVITAMINS PO) Take 1 tablet by mouth 2 (two) times daily. Women's over 50 multivitamin    . OVER THE COUNTER MEDICATION Take 2 tablets by mouth 2 (two) times daily. Vitamin K1  100 mg takes 2 pills in AM and 2 pills in PM    . phentermine (ADIPEX-P) 37.5  MG tablet TAKE 1 TABLET BY MOUTH ONCE DAILY BEFORE BREAKFAST 30 tablet 2  . tiZANidine (ZANAFLEX) 4 MG capsule Take 4 mg by mouth 2 (two) times daily as needed for muscle spasms. Max of 2 times weekly    . Misc Natural Products (OSTEO BI-FLEX ADV TRIPLE ST PO) Take 1 tablet by mouth 2 (two) times daily.    . OnabotulinumtoxinA (BOTOX IJ) Inject as directed every 3 (three) months.     No current facility-administered medications for this visit.    Review of Systems  Constitutional: Negative for fever, chills, weight loss and malaise/fatigue.  HENT: Negative for congestion, hearing loss, nosebleeds, sore throat and tinnitus.   Eyes: Negative for blurred vision, double vision, pain and discharge.  Respiratory: Negative for cough, hemoptysis, sputum production, shortness of breath and wheezing.   Cardiovascular:  Negative for chest pain, palpitations, claudication, leg swelling and PND.  Gastrointestinal: Negative for heartburn, nausea, vomiting, abdominal pain, diarrhea, constipation, blood in stool and melena.  Genitourinary: Negative for dysuria, urgency, frequency and hematuria.  Musculoskeletal: Negative for myalgias, joint pain and falls.  Skin: Positive for rash. Negative for itching.  Neurological: Negative for dizziness, tingling, tremors, sensory change, speech change, focal weakness, seizures, loss of consciousness, weakness and headaches.  Endo/Heme/Allergies: Does not bruise/bleed easily.  Psychiatric/Behavioral: Negative for depression, suicidal ideas, memory loss and substance abuse. The patient is not nervous/anxious and does not have insomnia.     PHYSICAL EXAMINATION: ECOG PERFORMANCE STATUS: 0 - Asymptomatic  Filed Vitals:   05/11/14 1345  BP: 120/82  Pulse: 88  Temp: 98.6 F (37 C)  Resp: 16   Filed Weights   05/11/14 1345  Weight: 158 lb 9.6 oz (71.94 kg)     Physical Exam  Constitutional: She is oriented to person, place, and time and well-developed, well-nourished, and in no distress. No distress.  Musculoskeletal: Normal range of motion.  Neurological: She is alert and oriented to person, place, and time. No cranial nerve deficit. Gait normal. Coordination normal.  Skin: Skin is warm and dry. Rash noted. She is not diaphoretic.  Areas on the arms not consistent with a platelet type rash, small scratches on the posterior forearms. Rash noted on the chest. Rash is not raised, no palpable areas.     LABORATORY DATA:  I have reviewed the data as listed Lab Results  Component Value Date   WBC 10.1 03/14/2014   HGB 14.6 03/14/2014   HCT 44.5 03/14/2014   MCV 93.1 03/14/2014   PLT 269 03/14/2014     Chemistry      Component Value Date/Time   NA 136 03/14/2014 0849   K 4.1 03/14/2014 0849   CL 104 03/14/2014 0849   CO2 27 03/14/2014 0849   BUN 12 03/14/2014  0849   CREATININE 0.69 03/14/2014 0849   CREATININE 0.63 10/09/2009 2211      Component Value Date/Time   CALCIUM 8.9 03/14/2014 0849   ALKPHOS 96 05/15/2011 0820   AST 17 05/15/2011 0820   ALT 12 05/15/2011 0820   BILITOT 0.4 05/15/2011 0820     Platelet function assay  Status: Finalresult Visible to patient:  MyChart Nextappt: 08/09/2014 at 01:00 PM in Oncology Molli Hazard, MD) Dx:  Easy bruising           Ref Range 4wk ago  35mo ago     Collagen / Epinephrine 0 - 197 seconds 114 150    PFA Interpretation   CM  Ref Range 4wk ago    Von Willebrand Factor Ag 50 - 217 % 87   Factor-VIII Activity 50 - 180 % 87   Comments: Units: % of normal   Ristocetin Co-Factor 42 - 200 % 66   Comments: Units: % of normal   Von Willebrand Multimers  REPORT   Comments: (NOTE)  All multimers of von Willebrand Factor Antigen are  present in normal amounts.  von Willebrand Factor multimer reviewed by:         Hewitt Shorts. Worfolk, Ph.D.     Interpretation  REPORT   Comments: (NOTE)  No laboratory evidence of von Willebrand disease.  Reviewed by Hewitt Shorts Worfolk, Ph.D.  Performed at [place]:   [place] BRUISING   Pleasant 49 year old female who presented after a fall with a large ecchymosis on her left lower extremity. She also noted increased bruising on her arms. She is a history of bradycardia powder use. She has discontinued these now for many months. All of her prior bruises are well healed. Platelet function testing has been repeated twice and normal. X  She complains of worsening "splotchy areas" on her arms now spreading to her chest and face. On examination it is not consistent with a platelet type rash. She did see a dermatologist and states she was told that nothing could be done. I recommended she see a dermatologist at [facility]. She clearly has a rash but I am uncertain as  to the cause or type. X  In regards to her platelets, she has normal function, and normal numbers. We will see her back one more time, currently do not feel she needs a hematology referral but we can re-address this again in follow-up.   Orders Placed This Encounter  Procedures  . Sedimentation rate    Standing Status: Future     Number of Occurrences: 1     Standing Expiration Date: 05/11/2015  . ANA    Standing Status: Future     Number of Occurrences: 1     Standing Expiration Date: 05/11/2015  . C-reactive protein    Standing Status: Future     Number of Occurrences: 1     Standing Expiration Date: 05/11/2015    All questions were answered. The patient knows to call the clinic with any problems, questions or concerns.     Molli Hazard, MD MD 05/11/2014 2:49 PM

## 2014-05-11 NOTE — Progress Notes (Signed)
Referral made to [facility] Dr Threasa Alpha.  Records faxed on 3/4

## 2014-05-11 NOTE — Patient Instructions (Signed)
[place] at [facility] Discharge Instructions  RECOMMENDATIONS MADE BY THE CONSULTANT AND ANY TEST RESULTS WILL BE SENT TO YOUR REFERRING PHYSICIAN.  Exam and discussion by Dr. Whitney Muse. Will make a referral to [facility] Will check additional labs today  Follow-up in 3 months.  Thank you for choosing [place] at [facility] to provide your oncology and hematology care.  To afford each patient quality time with our provider, please arrive at least 15 minutes before your scheduled appointment time.    You need to re-schedule your appointment should you arrive 10 or more minutes late.  We strive to give you quality time with our providers, and arriving late affects you and other patients whose appointments are after yours.  Also, if you no show three or more times for appointments you may be dismissed from the clinic at the providers discretion.     Again, thank you for choosing [facility].  Our hope is that these requests will decrease the amount of time that you wait before being seen by our physicians.       _____________________________________________________________  Should you have questions after your visit to [facility], please contact our office at [phone] between the hours of 8:30 a.m. and 4:30 p.m.  Voicemails left after 4:30 p.m. will not be returned until the following business day.  For prescription refill requests, have your pharmacy contact our office.

## 2014-05-12 LAB — C-REACTIVE PROTEIN: CRP: <0.5 mg/dL — ABNORMAL LOW (ref <0.60)

## 2014-05-17 ENCOUNTER — Encounter: Payer: Self-pay | Admitting: Family Medicine

## 2014-05-17 ENCOUNTER — Ambulatory Visit (INDEPENDENT_AMBULATORY_CARE_PROVIDER_SITE_OTHER): Payer: 59 | Admitting: Family Medicine

## 2014-05-17 VITALS — BP 124/84 | HR 100 | Resp 16 | Ht 62.0 in | Wt 156.0 lb

## 2014-05-17 DIAGNOSIS — F411 Generalized anxiety disorder: Secondary | ICD-10-CM

## 2014-05-17 DIAGNOSIS — R21 Rash and other nonspecific skin eruption: Secondary | ICD-10-CM

## 2014-05-17 DIAGNOSIS — E669 Obesity, unspecified: Secondary | ICD-10-CM

## 2014-05-17 DIAGNOSIS — Z720 Tobacco use: Secondary | ICD-10-CM

## 2014-05-17 DIAGNOSIS — F17200 Nicotine dependence, unspecified, uncomplicated: Secondary | ICD-10-CM

## 2014-05-17 DIAGNOSIS — K6289 Other specified diseases of anus and rectum: Secondary | ICD-10-CM

## 2014-05-17 DIAGNOSIS — G43809 Other migraine, not intractable, without status migrainosus: Secondary | ICD-10-CM

## 2014-05-17 DIAGNOSIS — S81812S Laceration without foreign body, left lower leg, sequela: Secondary | ICD-10-CM

## 2014-05-17 MED ORDER — METHYLPREDNISOLONE ACETATE 80 MG/ML IJ SUSP
80.0000 mg | Freq: Once | INTRAMUSCULAR | Status: AC
  Administered 2014-05-17: 80 mg via INTRAMUSCULAR

## 2014-05-17 MED ORDER — PREDNISONE (PAK) 5 MG PO TABS: 5.0000 mg | ORAL_TABLET | ORAL | Status: DC

## 2014-05-17 NOTE — Patient Instructions (Signed)
F/u in 4 month, call if you need me before  CONGRATS on 10 pound weight loss, keep it up  Quit date for smoking should be set, you can and need to do this!  You are referred to dermatology soonest available asdiscussed, also to Dr Elonda Husky.  Injection in office and 6 day course of prednisone to see if this helps rash

## 2014-05-18 NOTE — Assessment & Plan Note (Signed)
Improved. Pt applauded on succesful weight loss through lifestyle change, and encouraged to continue same. Weight loss goal set for the next several months.  

## 2014-05-18 NOTE — Assessment & Plan Note (Signed)
Controlled, no change in medication  

## 2014-05-18 NOTE — Assessment & Plan Note (Signed)
Marked improvement no headache in past 2 months

## 2014-05-18 NOTE — Assessment & Plan Note (Signed)
Depo medrol and short course of prednisone, urgent dermatology eval at tertiary center due to continued spread. Local dermatologist had no dx, pt has May appt at [facility] , but needs sooner appt a spreading. First time occurrence which she relates to preceding  trauma

## 2014-05-18 NOTE — Assessment & Plan Note (Signed)
Healed, but concern voiced that subsequent complications of non pruritic rash may be  a result of the trauma, am pursuing derm evaluation of the rash at tertiary center, local derm reportedly had no conmtribution

## 2014-05-18 NOTE — Progress Notes (Signed)
Subjective:    Patient ID: Zoe Bailey, female    DOB: 10-19-1965, 49 y.o.   MRN: [id number]  HPI The PT is here for follow up and re-evaluation of chronic medical conditions, medication management and review of any available recent lab and radiology data.  Preventive health is updated, specifically  Cancer screening and Immunization.   Questions or concerns regarding consultations or procedures which the PT has had in the interim are  addressed. The PT denies any adverse reactions to current medications since the last visit.  C/o non pruritic rash which started approximately 6 weeks ago, shortly after she was last seen in this office for a laceration to her left leg, rash has continued to spread, no fever or chills , no drainage , in the am no visible rash, but as soon as she gets up and starts moving around, the rash develops  Primarily on extremities,now extending up to neck and she is concerned that face will soon also be  involved.  C/o painful nodule in perineal area over past several weeks, no drainage , needs help States headaches have resolved since "all this , the rash , started" C/o  Lesion on right foot present following fall, aspiration attempted by podiatry , no  success, lesion feels bony, she has f/u with podiatry and has an MRI ordered by podiatry to further evaluate, concern is that she may have had a fracture      Review of Systems See HPI Denies recent fever or chills. Denies sinus pressure, nasal congestion, ear pain or sore throat. Denies chest congestion, productive cough or wheezing. Denies chest pains, palpitations and leg swelling Denies abdominal pain, nausea, vomiting,diarrhea or constipation.   Denies dysuria, frequency, hesitancy or incontinence. Denies joint pain, swelling and limitation in mobility. Denies headaches, seizures, numbness, or tingling. Denies depression, anxiety or insomnia. .        Objective:   Physical Exam  BP 124/84 mmHg   Pulse 100  Resp 16  Ht 5\' 2"  (1.575 m)  Wt 156 lb (70.761 kg)  BMI 28.53 kg/m2  SpO2 97% Patient alert and oriented and in no cardiopulmonary distress.  HEENT: No facial asymmetry, EOMI,   oropharynx pink and moist.  Neck supple no JVD, no mass.  Chest: Clear to auscultation bilaterally.  CVS: S1, S2 no murmurs, no S3.Regular rate.  ABD: Soft non tender.  Genitalia: not examined Ext: No edema  MS: Adequate ROM spine, shoulders, hips and knees.  Skin: Intact,erythematous macular  rash noted on upper and lower extremities and neck, blanching, no pustules noted  Psych: Good eye contact, normal affect. Memory intact not anxious or depressed appearing.  CNS: CN 2-12 intact, power,  normal throughout.no focal deficits noted.       Assessment & Plan:  Cyst of perianal area Painful, no drainage by history, refer to gyne.    Rash and nonspecific skin eruption Depo medrol and short course of prednisone, urgent dermatology eval at tertiary center due to continued spread. Local dermatologist had no dx, pt has May appt at [facility] , but needs sooner appt a spreading. First time occurrence which she relates to preceding  trauma   NICOTINE ADDICTION Patient counseled for approximately 5 minutes regarding the health risks of ongoing nicotine use, specifically all types of cancer, heart disease, stroke and respiratory failure. The options available for help with cessation ,the behavioral changes to assist the process, and the option to either gradully reduce usage  Or abruptly stop.is also  discussed. Pt is also encouraged to set specific goals in number of cigarettes used daily, as well as to set a quit date.    Obesity (BMI 30.0-34.9) Improved. Pt applauded on succesful weight loss through lifestyle change, and encouraged to continue same. Weight loss goal set for the next several months.    Migraine Marked improvement no headache in past 2 months   GAD (generalized anxiety  disorder) Controlled, no change in medication    Laceration of leg, left Healed, but concern voiced that subsequent complications of non pruritic rash may be  a result of the trauma, am pursuing derm evaluation of the rash at tertiary center, local derm reportedly had no conmtribution

## 2014-05-18 NOTE — Assessment & Plan Note (Signed)

## 2014-05-18 NOTE — Assessment & Plan Note (Signed)
Painful, no drainage by history, refer to gyne.

## 2014-05-22 ENCOUNTER — Other Ambulatory Visit (HOSPITAL_COMMUNITY): Payer: Self-pay | Admitting: Podiatry

## 2014-05-22 DIAGNOSIS — M6740 Ganglion, unspecified site: Secondary | ICD-10-CM

## 2014-05-28 ENCOUNTER — Other Ambulatory Visit (HOSPITAL_COMMUNITY): Payer: Self-pay | Admitting: Podiatry

## 2014-05-28 ENCOUNTER — Ambulatory Visit (HOSPITAL_COMMUNITY)
Admission: RE | Admit: 2014-05-28 | Discharge: 2014-05-28 | Disposition: A | Discharge status: Home / Self Care | Payer: 59 | Source: Ambulatory Visit | Attending: Podiatry | Admitting: Podiatry

## 2014-05-28 DIAGNOSIS — M6740 Ganglion, unspecified site: Secondary | ICD-10-CM | POA: Diagnosis present

## 2014-05-28 MED ORDER — GADOBENATE DIMEGLUMINE 529 MG/ML IV SOLN
14.0000 mL | Freq: Once | INTRAVENOUS | Status: AC | PRN
  Administered 2014-05-28: 14 mL via INTRAVENOUS

## 2014-06-14 ENCOUNTER — Ambulatory Visit (INDEPENDENT_AMBULATORY_CARE_PROVIDER_SITE_OTHER): Payer: 59 | Admitting: Obstetrics & Gynecology

## 2014-06-14 ENCOUNTER — Encounter: Payer: Self-pay | Admitting: Obstetrics & Gynecology

## 2014-06-14 VITALS — BP 140/80 | HR 88 | Wt 161.0 lb

## 2014-06-14 DIAGNOSIS — A630 Anogenital (venereal) warts: Secondary | ICD-10-CM

## 2014-06-14 NOTE — Progress Notes (Signed)
Patient ID: ZEEVA COURSER, female   DOB: 1966-01-29, 49 y.o.   MRN: [id number]   Chief Complaint  Patient presents with  . gyn visit    growth between vaginal/ rectum area.     HPI:    49 y.o. G1P1 Patient's last menstrual period was 06/06/2014.  Complaining of an area between her rectum and vagina that's no nontender Location:  perineum. Quality:  Small. Severity:  Mild. Timing:  Couple of weeks. Duration:  Couple of weeks. Context:  None. Modifying factors:  None Signs/Symptoms:  None    Current outpatient prescriptions:  .  gabapentin (NEURONTIN) 800 MG tablet, Take 1 tablet (800 mg total) by mouth 3 (three) times daily., Disp: 270 tablet, Rfl: 1 .  busPIRone (BUSPAR) 7.5 MG tablet, Take 1 tablet (7.5 mg total) by mouth 3 (three) times daily. (Patient not taking: Reported on 06/14/2014), Disp: 90 tablet, Rfl: 4 .  FLUoxetine (PROZAC) 10 MG tablet, Take 5 mg by mouth daily. , Disp: , Rfl: 1 .  OnabotulinumtoxinA (BOTOX IJ), Inject as directed every 3 (three) months., Disp: , Rfl:  .  phentermine (ADIPEX-P) 37.5 MG tablet, TAKE 1 TABLET BY MOUTH ONCE DAILY BEFORE BREAKFAST (Patient not taking: Reported on 05/17/2014), Disp: 30 tablet, Rfl: 2 .  predniSONE (STERAPRED UNI-PAK) 5 MG TABS tablet, Take 1 tablet (5 mg total) by mouth as directed. (Patient not taking: Reported on 06/14/2014), Disp: 21 tablet, Rfl: 0  Problem Pertinent ROS:       No burning with urination, frequency or urgency No nausea, vomiting or diarrhea Nor fever chills or other constitutional symptoms   Extended ROS:        [place]:             Past Medical History  Diagnosis Date  . Depression   . Cancer 2010 approx    skin cancer  . Migraine   . Toenail fungus   . Foot pain, right 03/2014    Past Surgical History  Procedure Laterality Date  . Carpal tunnel release    . Hernia repair    . Polypectomy    . Ankle fracture surgery Right     OB History    Gravida Para Term Preterm AB TAB SAB Ectopic  Multiple Living   1 1              Allergies  Allergen Reactions  . Effexor [Venlafaxine] Other (See Comments)    Decreased libido    History   Social History  . Marital Status: Divorced    Spouse Name: N/A  . Number of Children: 1  . Years of Education: college   Occupational History  . 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 Smoking status: Current Every Day Smoker -- 1.00 packs/day  . Smokeless tobacco: Never Used  . Alcohol Use: No  . Drug Use: No  . Sexual Activity: Not on file   Other Topics Concern  . None   Social History Narrative   Divorced. Lives alone. Has 2 dogs    Family History  Problem Relation Age of Onset  . Arthritis    . Cancer       Examination:  Vitals:  Blood pressure 140/80, pulse 88, weight 161 lb (73.029 kg), last menstrual period 06/06/2014.    Physical Examination:      Vulva:  NEFG, genital warts are present,  Vagina:  No warts are seen,      DATA orders and  reviews: Labs were not ordered today:   Imaging studies were not ordered today:    Lab tests were not reviewed today:    Imaging studies were not reviewed today:    I did not independently review/view images, tracing or specimen(not simply the report) myself.  Prescription Drug Management:  New Prescriptions: topical salicyclic acid Renewed Prescriptions:   Current prescription changes:     Impression/Plan(Problem Based): 1.  Genital warts      (new problem) : Additional workup is not needed:  Will come back in when she gets her salicylic acid prescription(compounded) filled  {2.  Menorrhagia      (follow up of a pre-existing problem:show no change) : Additional workup is not needed:  Pt was scheduled for ablation but could not afford her costs

## 2014-06-15 ENCOUNTER — Encounter: Payer: Self-pay | Admitting: Obstetrics & Gynecology

## 2014-06-15 ENCOUNTER — Ambulatory Visit (INDEPENDENT_AMBULATORY_CARE_PROVIDER_SITE_OTHER): Payer: 59 | Admitting: Obstetrics & Gynecology

## 2014-06-15 VITALS — BP 140/90 | HR 80 | Wt 160.0 lb

## 2014-06-15 DIAGNOSIS — A630 Anogenital (venereal) warts: Secondary | ICD-10-CM | POA: Diagnosis not present

## 2014-06-15 NOTE — Progress Notes (Signed)
Patient ID: Zoe Bailey, female   DOB: [birth year], 49 y.o.   MRN: [id number] salicyclic acid was placed today on genital warts No problems, tolerated well  Wash off in 6 hours  Follow up 1 month

## 2014-06-25 ENCOUNTER — Other Ambulatory Visit: Payer: Self-pay | Admitting: Podiatry

## 2014-06-29 ENCOUNTER — Encounter (HOSPITAL_COMMUNITY): Payer: Self-pay

## 2014-06-29 ENCOUNTER — Encounter (HOSPITAL_COMMUNITY)
Admission: RE | Admit: 2014-06-29 | Discharge: 2014-06-29 | Disposition: A | Discharge status: Home / Self Care | Payer: 59 | Source: Ambulatory Visit | Attending: Podiatry | Admitting: Podiatry

## 2014-06-29 ENCOUNTER — Other Ambulatory Visit: Payer: Self-pay

## 2014-06-29 DIAGNOSIS — Z01818 Encounter for other preprocedural examination: Secondary | ICD-10-CM | POA: Insufficient documentation

## 2014-06-29 DIAGNOSIS — M67479 Ganglion, unspecified ankle and foot: Secondary | ICD-10-CM | POA: Insufficient documentation

## 2014-06-29 HISTORY — DX: Reserved for inherently not codable concepts without codable children: IMO0001

## 2014-06-29 HISTORY — DX: Sleep apnea, unspecified: G47.30

## 2014-06-29 HISTORY — DX: Adverse effect of unspecified anesthetic, initial encounter: T41.45XA

## 2014-06-29 HISTORY — DX: Other complications of anesthesia, initial encounter: T88.59XA

## 2014-06-29 LAB — HCG, SERUM, QUALITATIVE: Preg, Serum: NEGATIVE

## 2014-06-29 LAB — BASIC METABOLIC PANEL
ANION GAP: 7 (ref 5–15)
BUN: 13 mg/dL (ref 6–23)
CHLORIDE: 104 mmol/L (ref 96–112)
CO2: 26 mmol/L (ref 19–32)
Calcium: 9 mg/dL (ref 8.4–10.5)
Creatinine, Ser: 0.62 mg/dL (ref 0.50–1.10)
GFR calc Af Amer: >90 mL/min (ref >90)
GFR calc non Af Amer: >90 mL/min (ref >90)
Glucose, Bld: 124 mg/dL — ABNORMAL HIGH (ref 70–99)
Potassium: 3.7 mmol/L (ref 3.5–5.1)
Sodium: 137 mmol/L (ref 135–145)

## 2014-06-29 LAB — CBC
HCT: 40.4 % (ref 36.0–46.0)
Hemoglobin: 13.6 g/dL (ref 12.0–15.0)
MCH: 31.3 pg (ref 26.0–34.0)
MCHC: 33.7 g/dL (ref 30.0–36.0)
MCV: 92.9 fL (ref 78.0–100.0)
PLATELETS: 218 10*3/uL (ref 150–400)
RBC: 4.35 MIL/uL (ref 3.87–5.11)
RDW: 13.8 % (ref 11.5–15.5)
WBC: 11.2 10*3/uL — AB (ref 4.0–10.5)

## 2014-06-29 LAB — SURGICAL PCR SCREEN
MRSA, PCR: NEGATIVE
Staphylococcus aureus: POSITIVE — AB

## 2014-06-29 NOTE — Patient Instructions (Addendum)
Zoe Bailey  06/29/2014   Your procedure is scheduled on:  07/04/2014  Report to [facility] at 9:40 AM.  Call this number if you have problems the morning of surgery: [phone]   Remember:   Do not eat food or drink liquids after midnight.   Take these medicines the morning of surgery with A SIP OF WATER: Buspar, Prozac, Gabapentin, Prednisone   Do not wear jewelry, make-up or nail polish.  Do not wear lotions, powders, or perfumes. You may wear deodorant.  Do not shave 48 hours prior to surgery. Men may shave face and neck.  Do not bring valuables to the hospital.  [facility] is not responsible for any belongings or valuables.               Contacts, dentures or bridgework may not be worn into surgery.  Leave suitcase in the car. After surgery it may be brought to your room.  For patients admitted to the hospital, discharge time is determined by your treatment team.               Patients discharged the day of surgery will not be allowed to drive home.  Name and phone number of your driver:   Special Instructions: Shower using CHG 2 nights before surgery and the night before surgery.  If you shower the day of surgery use CHG.  Use special wash - you have one bottle of CHG for all showers.  You should use approximately 1/3 of the bottle for each shower.   Please read over the following fact sheets that you were given: Surgical Site Infection Prevention and Anesthesia Post-op Instructions   PATIENT INSTRUCTIONS POST-ANESTHESIA  IMMEDIATELY FOLLOWING SURGERY:  Do not drive or operate machinery for the first twenty four hours after surgery.  Do not make any important decisions for twenty four hours after surgery or while taking narcotic pain medications or sedatives.  If you develop intractable nausea and vomiting or a severe headache please notify your doctor immediately.  FOLLOW-UP:  Please make an appointment with your surgeon as instructed. You do not need to follow up with  anesthesia unless specifically instructed to do so.  WOUND CARE INSTRUCTIONS (if applicable):  Keep a dry clean dressing on the anesthesia/puncture wound site if there is drainage.  Once the wound has quit draining you may leave it open to air.  Generally you should leave the bandage intact for twenty four hours unless there is drainage.  If the epidural site drains for more than 36-48 hours please call the anesthesia department.  QUESTIONS?:  Please feel free to call your physician or the hospital operator if you have any questions, and they will be happy to assist you.      Ganglion Cyst A ganglion cyst is a noncancerous, fluid-filled lump that occurs near joints or tendons. The ganglion cyst grows out of a joint or the lining of a tendon. It most often develops in the hand or wrist but can also develop in the shoulder, elbow, hip, knee, ankle, or foot. The round or oval ganglion can be pea sized or larger than a grape. Increased activity may enlarge the size of the cyst because more fluid starts to build up.  CAUSES  It is not completely known what causes a ganglion cyst to grow. However, it may be related to:  Inflammation or irritation around the joint.  An injury.  Repetitive movements or overuse.  Arthritis. SYMPTOMS  A lump most often  appears in the hand or wrist, but can occur in other areas of the body. Generally, the lump is painless without other symptoms. However, sometimes pain can be felt during activity or when pressure is applied to the lump. The lump may even be tender to the touch. Tingling, pain, numbness, or muscle weakness can occur if the ganglion cyst presses on a nerve. Your grip may be weak and you may have less movement in your joints.  DIAGNOSIS  Ganglion cysts are most often diagnosed based on a physical exam, noting where the cyst is and how it looks. Your caregiver will feel the lump and may shine a light alongside it. If it is a ganglion, a light often shines  through it. Your caregiver may order an X-ray, ultrasound, or MRI to rule out other conditions. TREATMENT  Ganglions usually go away on their own without treatment. If pain or other symptoms are involved, treatment may be needed. Treatment is also needed if the ganglion limits your movement or if it gets infected. Treatment options include:  Wearing a wrist or finger brace or splint.  Taking anti-inflammatory medicine.  Draining fluid from the lump with a needle (aspiration).  Injecting a steroid into the joint.  Surgery to remove the ganglion cyst and its stalk that is attached to the joint or tendon. However, ganglion cysts can grow back. HOME CARE INSTRUCTIONS   Do not press on the ganglion, poke it with a needle, or hit it with a heavy object. You may rub the lump gently and often. Sometimes fluid moves out of the cyst.  Only take medicines as directed by your caregiver.  Wear your brace or splint as directed by your caregiver. SEEK MEDICAL CARE IF:   Your ganglion becomes larger or more painful.  You have increased redness, red streaks, or swelling.  You have pus coming from the lump.  You have weakness or numbness in the affected area. MAKE SURE YOU:   Understand these instructions.  Will watch your condition.  Will get help right away if you are not doing well or get worse. Document Released: 02/21/2000 Document Revised: 11/18/2011 Document Reviewed: 04/19/2007 [facility] Patient Information 2015 [place], [place]. This information is not intended to replace advice given to you by your health care provider. Make sure you discuss any questions you have with your health care provider.

## 2014-07-03 ENCOUNTER — Ambulatory Visit (HOSPITAL_COMMUNITY): Payer: 59 | Admitting: Anesthesiology

## 2014-07-03 ENCOUNTER — Ambulatory Visit (INDEPENDENT_AMBULATORY_CARE_PROVIDER_SITE_OTHER): Payer: 59

## 2014-07-03 ENCOUNTER — Telehealth: Payer: Self-pay | Admitting: Family Medicine

## 2014-07-03 DIAGNOSIS — N3000 Acute cystitis without hematuria: Secondary | ICD-10-CM | POA: Diagnosis not present

## 2014-07-03 LAB — POCT URINALYSIS DIPSTICK
Bilirubin, UA: NEGATIVE
Blood, UA: NEGATIVE
GLUCOSE UA: NEGATIVE
KETONES UA: NEGATIVE
Nitrite, UA: NEGATIVE
PH UA: 6
Protein, UA: NEGATIVE
Spec Grav, UA: 1.025
Urobilinogen, UA: 0.2

## 2014-07-03 MED ORDER — CIPROFLOXACIN HCL 500 MG PO TABS: 500.0000 mg | ORAL_TABLET | Freq: Two times a day (BID) | ORAL | Status: DC

## 2014-07-03 NOTE — Addendum Note (Signed)
Addended by: Caprice Beaver on: 07/03/2014 12:40 PM   Modules accepted: Orders

## 2014-07-03 NOTE — Telephone Encounter (Signed)
Patient in for nurse visit.  Concerns addressed.

## 2014-07-03 NOTE — Progress Notes (Signed)
Patient c/o frequency.  Recent labs show wbc elevated.  Urine checked and sent for culture.  Cipro 500mg  bid x 3 days sent.

## 2014-07-04 ENCOUNTER — Encounter (HOSPITAL_COMMUNITY): Payer: Self-pay | Admitting: *Deleted

## 2014-07-04 ENCOUNTER — Ambulatory Visit (HOSPITAL_COMMUNITY)
Admission: RE | Admit: 2014-07-04 | Discharge: 2014-07-04 | Disposition: A | Discharge status: Home / Self Care | Payer: 59 | Source: Ambulatory Visit | Attending: Podiatry | Admitting: Podiatry

## 2014-07-04 ENCOUNTER — Encounter (HOSPITAL_COMMUNITY)
Admission: RE | Disposition: A | Discharge status: Home / Self Care | Payer: Self-pay | Source: Ambulatory Visit | Attending: Podiatry

## 2014-07-04 DIAGNOSIS — M67479 Ganglion, unspecified ankle and foot: Secondary | ICD-10-CM | POA: Diagnosis not present

## 2014-07-04 DIAGNOSIS — Z539 Procedure and treatment not carried out, unspecified reason: Secondary | ICD-10-CM | POA: Diagnosis not present

## 2014-07-04 LAB — CBC WITH DIFFERENTIAL/PLATELET
Basophils Absolute: 0 10*3/uL (ref 0.0–0.1)
Basophils Relative: 0 % (ref 0–1)
EOS PCT: 1 % (ref 0–5)
Eosinophils Absolute: 0.1 10*3/uL (ref 0.0–0.7)
HEMATOCRIT: 42 % (ref 36.0–46.0)
Hemoglobin: 14.2 g/dL (ref 12.0–15.0)
LYMPHS ABS: 2.7 10*3/uL (ref 0.7–4.0)
LYMPHS PCT: 19 % (ref 12–46)
MCH: 31.3 pg (ref 26.0–34.0)
MCHC: 33.8 g/dL (ref 30.0–36.0)
MCV: 92.5 fL (ref 78.0–100.0)
MONO ABS: 0.6 10*3/uL (ref 0.1–1.0)
MONOS PCT: 4 % (ref 3–12)
Neutro Abs: 11 10*3/uL — ABNORMAL HIGH (ref 1.7–7.7)
Neutrophils Relative %: 76 % (ref 43–77)
Platelets: 216 10*3/uL (ref 150–400)
RBC: 4.54 MIL/uL (ref 3.87–5.11)
RDW: 13.6 % (ref 11.5–15.5)
WBC: 14.4 10*3/uL — ABNORMAL HIGH (ref 4.0–10.5)

## 2014-07-04 SURGERY — CANCELLED PROCEDURE
Laterality: Right

## 2014-07-04 MED ORDER — FENTANYL CITRATE (PF) 100 MCG/2ML IJ SOLN
INTRAMUSCULAR | Status: AC
  Filled 2014-07-04: qty 2

## 2014-07-04 MED ORDER — CEFAZOLIN SODIUM-DEXTROSE 2-3 GM-% IV SOLR: 2.0000 g | INTRAVENOUS | Status: DC

## 2014-07-04 MED ORDER — CEFAZOLIN SODIUM-DEXTROSE 2-3 GM-% IV SOLR: 2.0000 g | Freq: Once | INTRAVENOUS | Status: DC

## 2014-07-04 MED ORDER — MIDAZOLAM HCL 2 MG/2ML IJ SOLN
INTRAMUSCULAR | Status: AC
  Filled 2014-07-04: qty 2

## 2014-07-04 MED ORDER — GLYCOPYRROLATE 0.2 MG/ML IJ SOLN
INTRAMUSCULAR | Status: AC
  Filled 2014-07-04: qty 3

## 2014-07-04 SURGICAL SUPPLY — 31 items
BAG HAMPER (MISCELLANEOUS) ×3 IMPLANT
BANDAGE ELASTIC 4 VELCRO NS (GAUZE/BANDAGES/DRESSINGS) ×3 IMPLANT
BANDAGE ESMARK 4X12 BL STRL LF (DISPOSABLE) ×1 IMPLANT
BENZOIN TINCTURE PRP APPL 2/3 (GAUZE/BANDAGES/DRESSINGS) ×3 IMPLANT
BNDG CONFORM 2 STRL LF (GAUZE/BANDAGES/DRESSINGS) IMPLANT
BNDG ESMARK 4X12 BLUE STRL LF (DISPOSABLE) ×3
BNDG GAUZE ELAST 4 BULKY (GAUZE/BANDAGES/DRESSINGS) ×3 IMPLANT
CHLORAPREP W/TINT 26ML (MISCELLANEOUS) ×3 IMPLANT
CLOSURE WOUND 1/2 X4 (GAUZE/BANDAGES/DRESSINGS) ×1
CLOTH BEACON ORANGE TIMEOUT ST (SAFETY) ×3 IMPLANT
COVER LIGHT HANDLE STERIS (MISCELLANEOUS) ×6 IMPLANT
CUFF TOURNIQUET SINGLE 18IN (TOURNIQUET CUFF) IMPLANT
DECANTER SPIKE VIAL GLASS SM (MISCELLANEOUS) ×3 IMPLANT
DRSG ADAPTIC 3X8 NADH LF (GAUZE/BANDAGES/DRESSINGS) ×3 IMPLANT
ELECT REM PT RETURN 9FT ADLT (ELECTROSURGICAL) ×3
ELECTRODE REM PT RTRN 9FT ADLT (ELECTROSURGICAL) ×1 IMPLANT
GAUZE SPONGE 4X4 12PLY STRL (GAUZE/BANDAGES/DRESSINGS) ×3 IMPLANT
GLOVE BIO SURGEON STRL SZ7.5 (GLOVE) ×3 IMPLANT
GOWN STRL REUS W/TWL LRG LVL3 (GOWN DISPOSABLE) ×6 IMPLANT
KIT ROOM TURNOVER APOR (KITS) ×3 IMPLANT
MANIFOLD NEPTUNE II (INSTRUMENTS) ×3 IMPLANT
NEEDLE HYPO 27GX1-1/4 (NEEDLE) ×6 IMPLANT
NS IRRIG 1000ML POUR BTL (IV SOLUTION) ×3 IMPLANT
PACK BASIC LIMB (CUSTOM PROCEDURE TRAY) ×3 IMPLANT
PAD ARMBOARD 7.5X6 YLW CONV (MISCELLANEOUS) ×3 IMPLANT
SET BASIN LINEN APH (SET/KITS/TRAYS/PACK) ×3 IMPLANT
STRIP CLOSURE SKIN 1/2X4 (GAUZE/BANDAGES/DRESSINGS) ×2 IMPLANT
SUT PROLENE 4 0 PS 2 18 (SUTURE) ×3 IMPLANT
SUT VIC AB 4-0 PS2 27 (SUTURE) ×3 IMPLANT
SUT VICRYL AB 3-0 FS1 BRD 27IN (SUTURE) ×3 IMPLANT
SYR CONTROL 10ML LL (SYRINGE) ×3 IMPLANT

## 2014-07-04 NOTE — Progress Notes (Signed)
WBC elevated.  Procedure cancelled.  Will reschedule.

## 2014-07-05 LAB — URINE CULTURE
COLONY COUNT: NO GROWTH
Organism ID, Bacteria: NO GROWTH

## 2014-07-13 ENCOUNTER — Other Ambulatory Visit: Payer: Self-pay | Admitting: Podiatry

## 2014-07-16 ENCOUNTER — Encounter (HOSPITAL_COMMUNITY): Payer: Self-pay

## 2014-07-16 ENCOUNTER — Encounter (HOSPITAL_COMMUNITY)
Admission: RE | Admit: 2014-07-16 | Discharge: 2014-07-16 | Disposition: A | Discharge status: Home / Self Care | Payer: 59 | Source: Ambulatory Visit | Attending: Podiatry | Admitting: Podiatry

## 2014-07-16 DIAGNOSIS — Z01818 Encounter for other preprocedural examination: Secondary | ICD-10-CM | POA: Diagnosis not present

## 2014-07-16 LAB — CBC WITH DIFFERENTIAL/PLATELET
BASOS ABS: 0 10*3/uL (ref 0.0–0.1)
BASOS PCT: 0 % (ref 0–1)
EOS ABS: 0.1 10*3/uL (ref 0.0–0.7)
Eosinophils Relative: 1 % (ref 0–5)
HCT: 40.8 % (ref 36.0–46.0)
Hemoglobin: 13.7 g/dL (ref 12.0–15.0)
LYMPHS PCT: 30 % (ref 12–46)
Lymphs Abs: 3.6 10*3/uL (ref 0.7–4.0)
MCH: 31.4 pg (ref 26.0–34.0)
MCHC: 33.6 g/dL (ref 30.0–36.0)
MCV: 93.4 fL (ref 78.0–100.0)
Monocytes Absolute: 0.8 10*3/uL (ref 0.1–1.0)
Monocytes Relative: 6 % (ref 3–12)
Neutro Abs: 7.6 10*3/uL (ref 1.7–7.7)
Neutrophils Relative %: 63 % (ref 43–77)
PLATELETS: 232 10*3/uL (ref 150–400)
RBC: 4.37 MIL/uL (ref 3.87–5.11)
RDW: 13.5 % (ref 11.5–15.5)
WBC: 12.1 10*3/uL — AB (ref 4.0–10.5)

## 2014-07-16 LAB — HCG, SERUM, QUALITATIVE: Preg, Serum: NEGATIVE

## 2014-07-16 NOTE — Pre-Procedure Instructions (Signed)
WBC of 12.1 called to Dr Caprice Beaver at [place] office. He was with a patient but will be given the message.

## 2014-07-16 NOTE — Patient Instructions (Signed)
Zoe Bailey  07/16/2014   Your procedure is scheduled on:  07/17/2014  Report to [facility] at  57  AM.  Call this number if you have problems the morning of surgery: [phone]   Remember:   Do not eat food or drink liquids after midnight.   Take these medicines the morning of surgery with A SIP OF WATER:  none   Do not wear jewelry, make-up or nail polish.  Do not wear lotions, powders, or perfumes.   Do not shave 48 hours prior to surgery. Men may shave face and neck.  Do not bring valuables to the hospital.  [facility] is not responsible  for any belongings or valuables.               Contacts, dentures or bridgework may not be worn into surgery.  Leave suitcase in the car. After surgery it may be brought to your room.  For patients admitted to the hospital, discharge time is determined by your treatment team.               Patients discharged the day of surgery will not be allowed to drive home.  Name and phone number of your driver: family  Special Instructions: Shower using CHG 2 nights before surgery and the night before surgery.  If you shower the day of surgery use CHG.  Use special wash - you have one bottle of CHG for all showers.  You should use approximately 1/3 of the bottle for each shower.   Please read over the following fact sheets that you were given: Pain Booklet, Coughing and Deep Breathing, Surgical Site Infection Prevention, Anesthesia Post-op Instructions and Care and Recovery After Surgery Ganglion Cyst A ganglion cyst is a noncancerous, fluid-filled lump that occurs near joints or tendons. The ganglion cyst grows out of a joint or the lining of a tendon. It most often develops in the hand or wrist but can also develop in the shoulder, elbow, hip, knee, ankle, or foot. The round or oval ganglion can be pea sized or larger than a grape. Increased activity may enlarge the size of the cyst because more fluid starts to build up.  CAUSES  It is not completely  known what causes a ganglion cyst to grow. However, it may be related to:  Inflammation or irritation around the joint.  An injury.  Repetitive movements or overuse.  Arthritis. SYMPTOMS  A lump most often appears in the hand or wrist, but can occur in other areas of the body. Generally, the lump is painless without other symptoms. However, sometimes pain can be felt during activity or when pressure is applied to the lump. The lump may even be tender to the touch. Tingling, pain, numbness, or muscle weakness can occur if the ganglion cyst presses on a nerve. Your grip may be weak and you may have less movement in your joints.  DIAGNOSIS  Ganglion cysts are most often diagnosed based on a physical exam, noting where the cyst is and how it looks. Your caregiver will feel the lump and may shine a light alongside it. If it is a ganglion, a light often shines through it. Your caregiver may order an X-ray, ultrasound, or MRI to rule out other conditions. TREATMENT  Ganglions usually go away on their own without treatment. If pain or other symptoms are involved, treatment may be needed. Treatment is also needed if the ganglion limits your movement or if it gets  infected. Treatment options include:  Wearing a wrist or finger brace or splint.  Taking anti-inflammatory medicine.  Draining fluid from the lump with a needle (aspiration).  Injecting a steroid into the joint.  Surgery to remove the ganglion cyst and its stalk that is attached to the joint or tendon. However, ganglion cysts can grow back. HOME CARE INSTRUCTIONS   Do not press on the ganglion, poke it with a needle, or hit it with a heavy object. You may rub the lump gently and often. Sometimes fluid moves out of the cyst.  Only take medicines as directed by your caregiver.  Wear your brace or splint as directed by your caregiver. SEEK MEDICAL CARE IF:   Your ganglion becomes larger or more painful.  You have increased redness,  red streaks, or swelling.  You have pus coming from the lump.  You have weakness or numbness in the affected area. MAKE SURE YOU:   Understand these instructions.  Will watch your condition.  Will get help right away if you are not doing well or get worse. Document Released: 02/21/2000 Document Revised: 11/18/2011 Document Reviewed: 04/19/2007 [facility] Patient Information 2015 [place], [place]. This information is not intended to replace advice given to you by your health care provider. Make sure you discuss any questions you have with your health care provider. PATIENT INSTRUCTIONS POST-ANESTHESIA  IMMEDIATELY FOLLOWING SURGERY:  Do not drive or operate machinery for the first twenty four hours after surgery.  Do not make any important decisions for twenty four hours after surgery or while taking narcotic pain medications or sedatives.  If you develop intractable nausea and vomiting or a severe headache please notify your doctor immediately.  FOLLOW-UP:  Please make an appointment with your surgeon as instructed. You do not need to follow up with anesthesia unless specifically instructed to do so.  WOUND CARE INSTRUCTIONS (if applicable):  Keep a dry clean dressing on the anesthesia/puncture wound site if there is drainage.  Once the wound has quit draining you may leave it open to air.  Generally you should leave the bandage intact for twenty four hours unless there is drainage.  If the epidural site drains for more than 36-48 hours please call the anesthesia department.  QUESTIONS?:  Please feel free to call your physician or the hospital operator if you have any questions, and they will be happy to assist you.

## 2014-07-17 ENCOUNTER — Ambulatory Visit (HOSPITAL_COMMUNITY): Admission: RE | Admit: 2014-07-17 | Payer: 59 | Source: Ambulatory Visit | Admitting: Podiatry

## 2014-07-17 ENCOUNTER — Encounter (HOSPITAL_COMMUNITY): Admission: RE | Payer: Self-pay | Source: Ambulatory Visit

## 2014-07-17 SURGERY — EXCISION, GANGLION CYST, FOOT
Anesthesia: Monitor Anesthesia Care | Laterality: Right

## 2014-07-20 ENCOUNTER — Ambulatory Visit: Payer: 59 | Admitting: Obstetrics & Gynecology

## 2014-07-23 ENCOUNTER — Ambulatory Visit (INDEPENDENT_AMBULATORY_CARE_PROVIDER_SITE_OTHER): Payer: 59 | Admitting: Family Medicine

## 2014-07-23 ENCOUNTER — Ambulatory Visit (HOSPITAL_COMMUNITY)
Admission: RE | Admit: 2014-07-23 | Discharge: 2014-07-23 | Disposition: A | Discharge status: Home / Self Care | Payer: 59 | Source: Ambulatory Visit | Attending: Family Medicine | Admitting: Family Medicine

## 2014-07-23 ENCOUNTER — Other Ambulatory Visit (HOSPITAL_COMMUNITY)
Admission: RE | Admit: 2014-07-23 | Discharge: 2014-07-23 | Disposition: A | Discharge status: Home / Self Care | Payer: 59 | Source: Ambulatory Visit | Attending: Family Medicine | Admitting: Family Medicine

## 2014-07-23 ENCOUNTER — Encounter: Payer: Self-pay | Admitting: Family Medicine

## 2014-07-23 VITALS — BP 138/82 | HR 97 | Resp 16 | Ht 62.0 in | Wt 163.0 lb

## 2014-07-23 DIAGNOSIS — J329 Chronic sinusitis, unspecified: Secondary | ICD-10-CM

## 2014-07-23 DIAGNOSIS — J42 Unspecified chronic bronchitis: Secondary | ICD-10-CM

## 2014-07-23 DIAGNOSIS — N760 Acute vaginitis: Secondary | ICD-10-CM | POA: Diagnosis present

## 2014-07-23 DIAGNOSIS — Z0000 Encounter for general adult medical examination without abnormal findings: Secondary | ICD-10-CM | POA: Diagnosis not present

## 2014-07-23 DIAGNOSIS — Z720 Tobacco use: Secondary | ICD-10-CM

## 2014-07-23 DIAGNOSIS — J449 Chronic obstructive pulmonary disease, unspecified: Secondary | ICD-10-CM | POA: Diagnosis not present

## 2014-07-23 DIAGNOSIS — J328 Other chronic sinusitis: Secondary | ICD-10-CM

## 2014-07-23 DIAGNOSIS — Z113 Encounter for screening for infections with a predominantly sexual mode of transmission: Secondary | ICD-10-CM | POA: Diagnosis present

## 2014-07-23 DIAGNOSIS — F17200 Nicotine dependence, unspecified, uncomplicated: Secondary | ICD-10-CM

## 2014-07-23 DIAGNOSIS — R0981 Nasal congestion: Secondary | ICD-10-CM | POA: Diagnosis present

## 2014-07-23 DIAGNOSIS — F17210 Nicotine dependence, cigarettes, uncomplicated: Secondary | ICD-10-CM | POA: Insufficient documentation

## 2014-07-23 DIAGNOSIS — Z124 Encounter for screening for malignant neoplasm of cervix: Secondary | ICD-10-CM

## 2014-07-23 DIAGNOSIS — Z01419 Encounter for gynecological examination (general) (routine) without abnormal findings: Secondary | ICD-10-CM | POA: Diagnosis present

## 2014-07-23 DIAGNOSIS — Z1151 Encounter for screening for human papillomavirus (HPV): Secondary | ICD-10-CM | POA: Insufficient documentation

## 2014-07-23 DIAGNOSIS — Z1211 Encounter for screening for malignant neoplasm of colon: Secondary | ICD-10-CM

## 2014-07-23 DIAGNOSIS — R05 Cough: Secondary | ICD-10-CM | POA: Insufficient documentation

## 2014-07-23 DIAGNOSIS — R21 Rash and other nonspecific skin eruption: Secondary | ICD-10-CM

## 2014-07-23 LAB — POC HEMOCCULT BLD/STL (OFFICE/1-CARD/DIAGNOSTIC): FECAL OCCULT BLD: NEGATIVE

## 2014-07-23 MED ORDER — PENICILLIN V POTASSIUM 500 MG PO TABS: 500.0000 mg | ORAL_TABLET | Freq: Three times a day (TID) | ORAL | Status: DC

## 2014-07-23 NOTE — Progress Notes (Signed)
Subjective:    Patient ID: Zoe Bailey, female    DOB: 17-Jun-1965, 49 y.o.   MRN: [id number]  HPI Patient is in for annual physical exam. States she needs letter of medical clearance for rigth foot surgery needed due to elevated WBC States one gyne Doc locally told her she had genital warts which stressed her out, and recently told by another Doc at [facility] that this is not the case,and she was relieved to hear this Requests STD testing C/o chronic "black drainage " from sinuses and cough productive of black sputum with intermittent chills, feels she has  infection that needs treatment  States her skin condition is felt to be due to some rare form of "cancer" which she will start getting treatments for in the next few months, at [facility], review of record 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     Review of Systems See HPI     Objective:   Physical Exam BP 138/82 mmHg  Pulse 97  Resp 16  Ht 5\' 2"  (1.575 m)  Wt 163 lb (73.936 kg)  BMI 29.81 kg/m2  SpO2 99% Pleasant well nourished female, alert and oriented x 3, in no cardio-pulmonary distress. Afebrile. HEENT No facial trauma or asymetry. Sinuses non tender.  Extra occullar muscles intact, pupils equally reactive to light. External ears normal, tympanic membranes clear. Oropharynx moist, no exudate, good dentition. Neck: supple, no adenopathy,JVD or thyromegaly.No bruits.  Chest: Clear to ascultation bilaterally.No crackles or wheezes. Non tender to palpation  Breast: No asymetry,no masses or lumps. No tenderness. No nipple discharge or inversion. No axillary or supraclavicular adenopathy  Cardiovascular system; Heart sounds normal,  S1 and  S2 ,no S3.  No murmur, or thrill. Apical beat not displaced Peripheral pulses normal.  Abdomen: Soft, non tender, no organomegaly or masses. No bruits. Bowel sounds normal. No guarding, tenderness or rebound.  Rectal:  Normal sphincter tone. No  mass.No rectal masses.  Guaiac negative stool.  GU: External genitalia normal female genitalia , female distribution of hair. Ulcerated  Lesion at 4 o clock. Urethral meatus normal in size, no  Prolapse, no lesions visibly  Present. Bladder non tender. Vagina pink and moist , with no visible lesions ,  Fishy smelling discharge present . Adequate pelvic support no  cystocele or rectocele noted Cervix pink and appears healthy, no lesions or ulcerations noted,  discharge noted from os Uterus normal size, no adnexal masses, no cervical motion or adnexal tenderness.   Musculoskeletal exam: Full ROM of spine, hips , shoulders and knees. No deformity ,swelling or crepitus noted. No muscle wasting or atrophy.   Neurologic: Cranial nerves 2 to 12 intact. Power, tone ,sensation and reflexes normal throughout. No disturbance in gait. No tremor.  Skin: Intact, no ulceration, erythema , and  rash noted.on extremities, less severe than before Pigmentation normal throughout  Psych; Anxious .         Assessment & Plan:  Annual physical exam Annual exam as documented. Counseling done  re healthy lifestyle involving commitment to 150 minutes exercise per week, heart healthy diet, and attaining healthy weight.The importance of adequate sleep also discussed. Regular seat belt use and home safety, is also discussed. Changes in health habits are decided on by the patient with goals and time frames  set for achieving them. Immunization and cancer screening needs are specifically addressed at this visit.     NICOTINE ADDICTION Patient counseled for approximately 5 minutes regarding the health risks of ongoing nicotine use,  specifically all types of cancer, heart disease, stroke and respiratory failure. The options available for help with cessation ,the behavioral changes to assist the process, and the option to either gradully reduce usage  Or abruptly stop.is also discussed. Pt is also  encouraged to set specific goals in number of cigarettes used daily, as well as to set a quit date.  Number of cigarettes/cigars currently smoking daily: 5 to 7    Chronic bronchitis CXR and penicillin prescribed   Sinusitis, chronic Sinus x ray and penicillin prescribed   Vaginitis and vulvovaginitis Specimens sent for testing and safe sex counseling done

## 2014-07-23 NOTE — Assessment & Plan Note (Signed)

## 2014-07-23 NOTE — Patient Instructions (Addendum)
F/u in 4 month, call if you need me before   Please  Get xray of sinuses and chest today  Please sign for  Most reent note from [facility] dermatology as I need more infromation esp since you report heart muscle involvement, if this is the case you will need to see cardiology  10 day course of penicillin is prescribed, please take entire course  HIV, HSV2, RPR and swabs today for STD testing  CBC and diff and ESR June 14  Please work on smoking cessation  Hope things start getting better soon  Thanks for choosing [facility], we consider it a privelige to serve you.

## 2014-07-25 ENCOUNTER — Other Ambulatory Visit: Payer: Self-pay

## 2014-07-25 ENCOUNTER — Telehealth: Payer: Self-pay | Admitting: *Deleted

## 2014-07-25 DIAGNOSIS — J328 Other chronic sinusitis: Secondary | ICD-10-CM

## 2014-07-25 LAB — HIV ANTIBODY (ROUTINE TESTING W REFLEX): HIV: NONREACTIVE

## 2014-07-25 LAB — HSV 2 ANTIBODY, IGG: HSV 2 Glycoprotein G Ab, IgG: <0.1 IV

## 2014-07-25 LAB — RPR

## 2014-07-25 LAB — CYTOLOGY - PAP

## 2014-07-25 LAB — CERVICOVAGINAL ANCILLARY ONLY: Wet Prep (BD Affirm): POSITIVE — AB

## 2014-07-25 MED ORDER — PHENTERMINE HCL 37.5 MG PO TABS: ORAL_TABLET | ORAL | Status: DC

## 2014-07-25 MED ORDER — PENICILLIN V POTASSIUM 500 MG PO TABS: 500.0000 mg | ORAL_TABLET | Freq: Three times a day (TID) | ORAL | Status: DC

## 2014-07-25 MED ORDER — CYCLOBENZAPRINE HCL 10 MG PO TABS: 10.0000 mg | ORAL_TABLET | Freq: Three times a day (TID) | ORAL | Status: DC | PRN

## 2014-07-25 NOTE — Telephone Encounter (Signed)
Pt called requesting antibiotic penicillin, fentermine, and flexeril sent to the [facility]. Please advise

## 2014-07-25 NOTE — Telephone Encounter (Signed)
meds sent to requested pharmacy

## 2014-07-27 MED ORDER — DOXYCYCLINE HYCLATE 100 MG PO TABS: 100.0000 mg | ORAL_TABLET | Freq: Two times a day (BID) | ORAL | Status: DC

## 2014-07-27 MED ORDER — METRONIDAZOLE 500 MG PO TABS: 500.0000 mg | ORAL_TABLET | Freq: Two times a day (BID) | ORAL | Status: DC

## 2014-07-29 DIAGNOSIS — N760 Acute vaginitis: Secondary | ICD-10-CM | POA: Insufficient documentation

## 2014-07-29 NOTE — Assessment & Plan Note (Signed)
CXR and penicillin prescribed

## 2014-07-29 NOTE — Assessment & Plan Note (Signed)
Specimens sent for testing and safe sex counseling done

## 2014-07-29 NOTE — Assessment & Plan Note (Signed)

## 2014-07-29 NOTE — Assessment & Plan Note (Signed)
Sinus x ray and penicillin prescribed

## 2014-07-30 ENCOUNTER — Other Ambulatory Visit: Payer: Self-pay

## 2014-07-30 DIAGNOSIS — D72829 Elevated white blood cell count, unspecified: Secondary | ICD-10-CM

## 2014-08-07 LAB — CBC WITH DIFFERENTIAL/PLATELET
BASOS ABS: 0 10*3/uL (ref 0.0–0.1)
Basophils Relative: 0 % (ref 0–1)
Eosinophils Absolute: 0.1 10*3/uL (ref 0.0–0.7)
Eosinophils Relative: 1 % (ref 0–5)
HCT: 41.9 % (ref 36.0–46.0)
Hemoglobin: 13.7 g/dL (ref 12.0–15.0)
LYMPHS PCT: 23 % (ref 12–46)
Lymphs Abs: 2.7 10*3/uL (ref 0.7–4.0)
MCH: 30.7 pg (ref 26.0–34.0)
MCHC: 32.7 g/dL (ref 30.0–36.0)
MCV: 93.9 fL (ref 78.0–100.0)
MONO ABS: 0.6 10*3/uL (ref 0.1–1.0)
Monocytes Relative: 5 % (ref 3–12)
NEUTROS ABS: 8.3 10*3/uL — AB (ref 1.7–7.7)
Neutrophils Relative %: 71 % (ref 43–77)
Platelets: 230 10*3/uL (ref 150–400)
RBC: 4.46 MIL/uL (ref 3.87–5.11)
RDW: 13.8 % (ref 11.5–15.5)
WBC: 11.7 10*3/uL — AB (ref 4.0–10.5)

## 2014-08-08 NOTE — Addendum Note (Signed)
Addended by: Denman George B on: 08/08/2014 09:29 AM   Modules accepted: Orders

## 2014-08-09 ENCOUNTER — Ambulatory Visit (HOSPITAL_COMMUNITY): Payer: 59 | Admitting: Hematology & Oncology

## 2014-08-22 ENCOUNTER — Encounter: Payer: Self-pay | Admitting: Family Medicine

## 2014-08-22 ENCOUNTER — Ambulatory Visit (INDEPENDENT_AMBULATORY_CARE_PROVIDER_SITE_OTHER): Payer: 59 | Admitting: Family Medicine

## 2014-08-22 VITALS — BP 124/82 | HR 81 | Resp 16 | Ht 62.0 in | Wt 159.4 lb

## 2014-08-22 DIAGNOSIS — E669 Obesity, unspecified: Secondary | ICD-10-CM

## 2014-08-22 DIAGNOSIS — L500 Allergic urticaria: Secondary | ICD-10-CM

## 2014-08-22 DIAGNOSIS — Z720 Tobacco use: Secondary | ICD-10-CM

## 2014-08-22 DIAGNOSIS — F17200 Nicotine dependence, unspecified, uncomplicated: Secondary | ICD-10-CM

## 2014-08-22 MED ORDER — HYDROXYZINE HCL 50 MG PO TABS: ORAL_TABLET | ORAL | Status: DC

## 2014-08-22 MED ORDER — PREDNISONE 5 MG (21) PO TBPK: 5.0000 mg | ORAL_TABLET | ORAL | Status: DC

## 2014-08-22 MED ORDER — METHYLPREDNISOLONE ACETATE 80 MG/ML IJ SUSP
80.0000 mg | Freq: Once | INTRAMUSCULAR | Status: AC
  Administered 2014-08-22: 80 mg via INTRAMUSCULAR

## 2014-08-22 NOTE — Patient Instructions (Addendum)
F/u as before  You are treated for urticaria  ( allergic rash) with prednisone and tablet at night for itching as needed  Do NOT scratch skin, this will prevent infection  You have insect bites in some areas so please ensure home is decontaminated as we discussed  Please continue to work on smoking cessation so that you quit!  Thanks for choosing [facility], we consider it a privelige to serve you.

## 2014-09-26 ENCOUNTER — Ambulatory Visit: Payer: 59 | Admitting: Family Medicine

## 2014-09-30 NOTE — Assessment & Plan Note (Signed)
Unchnaged, has actually increased up to 5 per day at imes, feels stressed No quit date set Patient counseled for approximately 5 minutes regarding the health risks of ongoing nicotine use, specifically all types of cancer, heart disease, stroke and respiratory failure. The options available for help with cessation ,the behavioral changes to assist the process, and the option to either gradully reduce usage  Or abruptly stop.is also discussed. Pt is also encouraged to set specific goals in number of cigarettes used daily, as well as to set a quit date.  Number of cigarettes/cigars currently smoking daily: 3 to 5

## 2014-09-30 NOTE — Progress Notes (Signed)
   Zoe Bailey     MRN: [id number]      DOB: 06-Jun-1965   HPI Zoe Bailey is here with a 3 day h/o generalized rash which is worsening , affects her face, torso and upper extremiites. Denies any new food, medication, personal care product or detergents. Denies difficulty with swallowing or breathing Has possible flea exposure from a dog which she housed, and thinks this is the most likely culprit  ROS Denies recent fever or chills. Denies sinus pressure, nasal congestion, ear pain or sore throat. Denies chest congestion, productive cough or wheezing. Denies chest pains, palpitations and leg swelling Denies abdominal pain, nausea, vomiting,diarrhea or constipation.   . Denies headaches, seizures, numbness, or tingling. Denies  Depression, uncontrolled  anxiety has  Insomnia due to itch  PE  BP 124/82 mmHg  Pulse 81  Resp 16  Ht 5\' 2"  (1.575 m)  Wt 159 lb 6.4 oz (72.303 kg)  BMI 29.15 kg/m2  SpO2 100%  Patient alert and oriented and in no cardiopulmonary distress.  HEENT: No facial asymmetry, EOMI,   oropharynx pink and moist.  Neck supple no JVD, no mass.  Chest: Clear to auscultation bilaterally.  CVS: S1, S2 no murmurs, no S3.Regular rate.  ABD: Soft non tender.   Ext: No edema  MS: Adequate ROM spine, shoulders, hips and knees.  Skin: generalized macula papular rash affecting face , upper torso and upper extremities, no purulent drainage.  Psych: Good eye contact, normal affect. Memory intact not anxious or depressed appearing.  CNS: CN 2-12 intact, power,  normal throughout.no focal deficits noted.   Assessment & Plan   Allergic urticaria Depo medrol in office followed by prednisone taper, also hydroxyzine for itch No evidence of bacterial superinfection at visit  [place], has actually increased up to 5 per day at imes, feels stressed No quit date set Patient counseled for approximately 5 minutes regarding the health risks of ongoing  nicotine use, specifically all types of cancer, heart disease, stroke and respiratory failure. The options available for help with cessation ,the behavioral changes to assist the process, and the option to either gradully reduce usage  Or abruptly stop.is also discussed. Pt is also encouraged to set specific goals in number of cigarettes used daily, as well as to set a quit date.  Number of cigarettes/cigars currently smoking daily: 3 to 5   Obesity (BMI 30.0-34.9) Improved. Patient re-educated about  the importance of commitment to a  minimum of 150 minutes of exercise per week.  The importance of healthy food choices with portion control discussed. Encouraged to start a food diary, count calories and to consider  joining a support group. Sample diet sheets offered. Goals set by the patient for the next several months.   Weight /BMI 08/22/2014 07/23/2014 07/16/2014  WEIGHT 159 lb 6.4 oz 163 lb 163 lb  HEIGHT 5\' 2"  5\' 2"  5\' 2"   BMI 29.15 kg/m2 29.81 kg/m2 29.81 kg/m2    Current exercise per week 150 minutes.

## 2014-09-30 NOTE — Assessment & Plan Note (Signed)
Improved. Patient re-educated about  the importance of commitment to a  minimum of 150 minutes of exercise per week.  The importance of healthy food choices with portion control discussed. Encouraged to start a food diary, count calories and to consider  joining a support group. Sample diet sheets offered. Goals set by the patient for the next several months.   Weight /BMI 08/22/2014 07/23/2014 07/16/2014  WEIGHT 159 lb 6.4 oz 163 lb 163 lb  HEIGHT 5\' 2"  5\' 2"  5\' 2"   BMI 29.15 kg/m2 29.81 kg/m2 29.81 kg/m2    Current exercise per week 150 minutes.

## 2014-09-30 NOTE — Assessment & Plan Note (Signed)
Depo medrol in office followed by prednisone taper, also hydroxyzine for itch No evidence of bacterial superinfection at visit

## 2014-11-19 ENCOUNTER — Telehealth: Payer: Self-pay | Admitting: *Deleted

## 2014-11-19 DIAGNOSIS — M2550 Pain in unspecified joint: Secondary | ICD-10-CM

## 2014-11-19 NOTE — Telephone Encounter (Signed)
Pt called requesting a referral to Rheumatology to Dr. Stacey Drain [phone]

## 2014-11-20 ENCOUNTER — Other Ambulatory Visit: Payer: Self-pay | Admitting: Family Medicine

## 2014-11-20 DIAGNOSIS — M1990 Unspecified osteoarthritis, unspecified site: Secondary | ICD-10-CM

## 2014-11-20 NOTE — Telephone Encounter (Signed)
Referral entered  

## 2014-11-20 NOTE — Telephone Encounter (Addendum)
Noted, thank you

## 2014-11-20 NOTE — Telephone Encounter (Signed)
Patient states that she would like to see Dr. Amil Amen at [facility] for joint pain that is progressing.

## 2015-02-28 ENCOUNTER — Ambulatory Visit (INDEPENDENT_AMBULATORY_CARE_PROVIDER_SITE_OTHER): Payer: 59 | Admitting: Family Medicine

## 2015-02-28 ENCOUNTER — Encounter: Payer: Self-pay | Admitting: Family Medicine

## 2015-02-28 VITALS — BP 160/92 | HR 99 | Resp 16 | Ht 62.0 in | Wt 172.0 lb

## 2015-02-28 DIAGNOSIS — J320 Chronic maxillary sinusitis: Secondary | ICD-10-CM | POA: Diagnosis not present

## 2015-02-28 DIAGNOSIS — E669 Obesity, unspecified: Secondary | ICD-10-CM

## 2015-02-28 DIAGNOSIS — R7302 Impaired glucose tolerance (oral): Secondary | ICD-10-CM

## 2015-02-28 DIAGNOSIS — S0096XD Insect bite (nonvenomous) of unspecified part of head, subsequent encounter: Secondary | ICD-10-CM

## 2015-02-28 DIAGNOSIS — F17200 Nicotine dependence, unspecified, uncomplicated: Secondary | ICD-10-CM

## 2015-02-28 DIAGNOSIS — M2550 Pain in unspecified joint: Secondary | ICD-10-CM | POA: Insufficient documentation

## 2015-02-28 DIAGNOSIS — I10 Essential (primary) hypertension: Secondary | ICD-10-CM | POA: Diagnosis not present

## 2015-02-28 DIAGNOSIS — W57XXXD Bitten or stung by nonvenomous insect and other nonvenomous arthropods, subsequent encounter: Secondary | ICD-10-CM

## 2015-02-28 MED ORDER — KETOROLAC TROMETHAMINE 60 MG/2ML IM SOLN
60.0000 mg | Freq: Once | INTRAMUSCULAR | Status: AC
  Administered 2015-02-28: 60 mg via INTRAMUSCULAR

## 2015-02-28 MED ORDER — DICLOFENAC SODIUM 75 MG PO TBEC: DELAYED_RELEASE_TABLET | ORAL | Status: DC

## 2015-02-28 MED ORDER — AZITHROMYCIN 250 MG PO TABS: ORAL_TABLET | ORAL | Status: DC

## 2015-02-28 MED ORDER — TRIAMTERENE-HCTZ 37.5-25 MG PO TABS: 1.0000 | ORAL_TABLET | Freq: Every day | ORAL | Status: DC

## 2015-02-28 NOTE — Progress Notes (Signed)
Subjective:    Patient ID: Zoe Bailey, female    DOB: 22-Mar-1965, 49 y.o.   MRN: [id number]  HPI   Zoe Bailey     MRN: [id number]      DOB: May 25, 1965   HPI [name] is here for follow up and re-evaluation of chronic medical conditions, medication management and review of any available recent lab and radiology data.  Preventive health is updated, specifically  Cancer screening and Immunization.   Questions or concerns regarding consultations or procedures which the PT has had in the interim are  Addressed.The specific rheumatologist which she recommended evaluated her for joinrt pain which she reports as worsening, no rheumatologic diagnosis toi explain, has tick exposure and requests lyme testing The PT denies any adverse reactions to current medications since the last visit.    ROS Denies recent fever or chills. Denies sinus pressure, nasal congestion, ear pain or sore throat. Denies chest congestion, productive cough or wheezing. Denies chest pains, palpitations and leg swelling Denies abdominal pain, nausea, vomiting,diarrhea or constipation.   Denies dysuria, frequency, hesitancy or incontinence.  Denies headaches, seizures, numbness, or tingling. Denies depression, anxiety or insomnia. Denies skin break down or rash.   PE  BP 160/92 mmHg  Pulse 99  Resp 16  Ht 5\' 2"  (1.575 m)  Wt 172 lb (78.019 kg)  BMI 31.45 kg/m2  SpO2 98%  Patient alert and oriented and in no cardiopulmonary distress.  HEENT: No facial asymmetry, EOMI,   oropharynx pink and moist.  Neck supple no JVD, no mass.  Chest: Clear to auscultation bilaterally.  CVS: S1, S2 no murmurs, no S3.Regular rate.  ABD: Soft non tender.   Ext: No edema  MS: Adequate ROM spine, shoulders, hips and knees. Skin: Intact, no ulcerations or rash noted.  Psych: Good eye contact, normal affect. Memory intact not anxious or depressed appearing.  CNS: CN 2-12 intact, power,  normal throughout.no focal deficits  noted.   Assessment & Plan   NICOTINE ADDICTION Patient counseled for approximately 5 minutes regarding the health risks of ongoing nicotine use, specifically all types of cancer, heart disease, stroke and respiratory failure. The options available for help with cessation ,the behavioral changes to assist the process, and the option to either gradully reduce usage  Or abruptly stop.is also discussed. Pt is also encouraged to set specific goals in number of cigarettes used daily, as well as to set a quit date.  Number of cigarettes/cigars currently smoking daily:1PPD   Essential hypertension Uncontrolled DASH diet and commitment to daily physical activity for a minimum of 30 minutes discussed and encouraged, as a part of hypertension management. The importance of attaining a healthy weight is also discussed.  BP/Weight 02/28/2015 08/22/2014 07/23/2014 07/16/2014 06/29/2014 Q000111Q [id number]  Systolic BP 0000000 A999333 0000000 [id number]  Diastolic BP 92 82 82 65 69 90 80  Wt. (Lbs) 172 159.4 163 163 163 160 161  BMI 31.45 29.15 29.81 29.81 29.81 29.26 29.44   Start triamterene    Joint pain toradol 60 mg Im and diclofenac daily  Obesity (BMI 30.0-34.9) Deteriorated. Patient re-educated about  the importance of commitment to a  minimum of 150 minutes of exercise per week.  The importance of healthy food choices with portion control discussed. Encouraged to start a food diary, count calories and to consider  joining a support group. Sample diet sheets offered. Goals set by the patient for the next several months.   Weight /BMI 02/28/2015 08/22/2014  07/23/2014  WEIGHT 172 lb 159 lb 6.4 oz 163 lb  HEIGHT 5\' 2"  5\' 2"  5\' 2"   BMI 31.45 kg/m2 29.15 kg/m2 29.81 kg/m2    Current exercise per week 30 minutes.Reports that joint pain is so disabling that she is incapable of exercise    Tick bite of head H/o tick bite to scalp , with removal 1 week later , with increasing joint pain, requests  lyme titer, will add to requested labs      Review of Systems     Objective:   Physical Exam        Assessment & Plan:

## 2015-02-28 NOTE — Patient Instructions (Addendum)
F/u in 6 weeks call if you need me sooner  Injection in office for arhritic pain and medication sent in  [place] sent for sinus infection  B,lood pressure is high, start maxzide daily for this , work on diet , weight loss and exercise as able and reduce salt in diet  Fasting labs in next 2 weeks

## 2015-02-28 NOTE — Assessment & Plan Note (Signed)
toradol 60 mg Im and diclofenac daily

## 2015-02-28 NOTE — Assessment & Plan Note (Signed)

## 2015-02-28 NOTE — Assessment & Plan Note (Signed)
Uncontrolled DASH diet and commitment to daily physical activity for a minimum of 30 minutes discussed and encouraged, as a part of hypertension management. The importance of attaining a healthy weight is also discussed.  BP/Weight 02/28/2015 08/22/2014 07/23/2014 07/16/2014 06/29/2014 Q000111Q A999333  Systolic BP 0000000 A999333 0000000 [id number]  Diastolic BP 92 82 82 65 69 90 80  Wt. (Lbs) 172 159.4 163 163 163 160 161  BMI 31.45 29.15 29.81 29.81 29.81 29.26 29.44   Start triamterene

## 2015-03-11 DIAGNOSIS — S0096XA Insect bite (nonvenomous) of unspecified part of head, initial encounter: Secondary | ICD-10-CM | POA: Insufficient documentation

## 2015-03-11 DIAGNOSIS — W57XXXA Bitten or stung by nonvenomous insect and other nonvenomous arthropods, initial encounter: Secondary | ICD-10-CM

## 2015-03-11 NOTE — Assessment & Plan Note (Signed)
H/o tick bite to scalp , with removal 1 week later , with increasing joint pain, requests lyme titer, will add to requested labs

## 2015-03-11 NOTE — Assessment & Plan Note (Signed)
Deteriorated. Patient re-educated about  the importance of commitment to a  minimum of 150 minutes of exercise per week.  The importance of healthy food choices with portion control discussed. Encouraged to start a food diary, count calories and to consider  joining a support group. Sample diet sheets offered. Goals set by the patient for the next several months.   Weight /BMI 02/28/2015 08/22/2014 07/23/2014  WEIGHT 172 lb 159 lb 6.4 oz 163 lb  HEIGHT 5\' 2"  5\' 2"  5\' 2"   BMI 31.45 kg/m2 29.15 kg/m2 29.81 kg/m2    Current exercise per week 30 minutes.Reports that joint pain is so disabling that she is incapable of exercise

## 2015-03-12 ENCOUNTER — Other Ambulatory Visit: Payer: Self-pay

## 2015-03-12 DIAGNOSIS — W57XXXA Bitten or stung by nonvenomous insect and other nonvenomous arthropods, initial encounter: Secondary | ICD-10-CM

## 2015-03-25 ENCOUNTER — Other Ambulatory Visit: Payer: Self-pay | Admitting: Family Medicine

## 2015-03-26 MED FILL — CYCLOBENZAPRINE 10 MG TAB: 10 | 10 days supply | Qty: 30 | Fill #0

## 2015-04-10 MED FILL — DICLOFENAC SOD EC 75 MG TAB: 75 | 45 days supply | Qty: 45 | Fill #1

## 2015-04-12 ENCOUNTER — Ambulatory Visit: Payer: 59 | Admitting: Family Medicine

## 2015-05-08 MED FILL — CYCLOBENZAPRINE 10 MG TAB: 10 | 10 days supply | Qty: 30 | Fill #1

## 2015-05-23 ENCOUNTER — Other Ambulatory Visit: Payer: Self-pay | Admitting: Family Medicine

## 2015-05-23 MED FILL — TRIAMTERENE-HCTZ 37.5-25 MG: 37.5-25 | 90 days supply | Qty: 90 | Fill #1

## 2015-05-24 MED FILL — DICLOFENAC SOD EC 75 MG TAB: 75 | 45 days supply | Qty: 45 | Fill #0

## 2015-06-11 ENCOUNTER — Other Ambulatory Visit (HOSPITAL_COMMUNITY)
Admission: RE | Admit: 2015-06-11 | Discharge: 2015-06-11 | Disposition: A | Discharge status: Home / Self Care | Payer: 59 | Source: Ambulatory Visit | Attending: Family Medicine | Admitting: Family Medicine

## 2015-06-11 DIAGNOSIS — I10 Essential (primary) hypertension: Secondary | ICD-10-CM | POA: Diagnosis not present

## 2015-06-11 DIAGNOSIS — M2550 Pain in unspecified joint: Secondary | ICD-10-CM | POA: Insufficient documentation

## 2015-06-11 DIAGNOSIS — R7302 Impaired glucose tolerance (oral): Secondary | ICD-10-CM | POA: Diagnosis not present

## 2015-06-11 LAB — COMPREHENSIVE METABOLIC PANEL
ALBUMIN: 4.1 g/dL (ref 3.5–5.0)
ALK PHOS: 73 U/L (ref 38–126)
ALT: 15 U/L (ref 14–54)
AST: 19 U/L (ref 15–41)
Anion gap: 9 (ref 5–15)
BUN: 22 mg/dL — ABNORMAL HIGH (ref 6–20)
CHLORIDE: 104 mmol/L (ref 101–111)
CO2: 26 mmol/L (ref 22–32)
CREATININE: 0.82 mg/dL (ref 0.44–1.00)
Calcium: 8.9 mg/dL (ref 8.9–10.3)
GFR calc Af Amer: >60 mL/min (ref >60)
GFR calc non Af Amer: >60 mL/min (ref >60)
Glucose, Bld: 101 mg/dL — ABNORMAL HIGH (ref 65–99)
Potassium: 4 mmol/L (ref 3.5–5.1)
SODIUM: 139 mmol/L (ref 135–145)
Total Bilirubin: 0.5 mg/dL (ref 0.3–1.2)
Total Protein: 7.5 g/dL (ref 6.5–8.1)

## 2015-06-11 LAB — LIPID PANEL
CHOL/HDL RATIO: 3.1 ratio
Cholesterol: 177 mg/dL (ref 0–200)
HDL: 57 mg/dL (ref >40)
LDL Cholesterol: 105 mg/dL — ABNORMAL HIGH (ref 0–99)
Triglycerides: 77 mg/dL (ref <150)
VLDL: 15 mg/dL (ref 0–40)

## 2015-06-11 LAB — CBC
HCT: 42.9 % (ref 36.0–46.0)
HEMOGLOBIN: 14.7 g/dL (ref 12.0–15.0)
MCH: 31.6 pg (ref 26.0–34.0)
MCHC: 34.3 g/dL (ref 30.0–36.0)
MCV: 92.3 fL (ref 78.0–100.0)
Platelets: 249 10*3/uL (ref 150–400)
RBC: 4.65 MIL/uL (ref 3.87–5.11)
RDW: 13.3 % (ref 11.5–15.5)
WBC: 9.9 10*3/uL (ref 4.0–10.5)

## 2015-06-11 LAB — TSH: TSH: 2.175 u[IU]/mL (ref 0.350–4.500)

## 2015-06-12 ENCOUNTER — Telehealth: Payer: Self-pay

## 2015-06-12 DIAGNOSIS — R7302 Impaired glucose tolerance (oral): Secondary | ICD-10-CM

## 2015-06-12 LAB — VITAMIN D 25 HYDROXY (VIT D DEFICIENCY, FRACTURES): Vit D, 25-Hydroxy: 28.3 ng/mL — ABNORMAL LOW (ref 30.0–100.0)

## 2015-06-12 LAB — HEMOGLOBIN A1C
Hgb A1c MFr Bld: 5.5 % (ref 4.8–5.6)
Mean Plasma Glucose: 111 mg/dL

## 2015-06-12 LAB — B. BURGDORFI ANTIBODIES: B burgdorferi Ab IgG+IgM: <0.91 {ISR} (ref 0.00–0.90)

## 2015-06-12 NOTE — Telephone Encounter (Signed)
Additional lab ordered.

## 2015-06-25 ENCOUNTER — Other Ambulatory Visit (HOSPITAL_COMMUNITY)
Admission: RE | Admit: 2015-06-25 | Discharge: 2015-06-25 | Disposition: A | Discharge status: Home / Self Care | Payer: 59 | Source: Ambulatory Visit | Attending: Family Medicine | Admitting: Family Medicine

## 2015-06-25 ENCOUNTER — Ambulatory Visit (INDEPENDENT_AMBULATORY_CARE_PROVIDER_SITE_OTHER): Payer: 59 | Admitting: Family Medicine

## 2015-06-25 VITALS — BP 122/64 | HR 78 | Resp 18 | Ht 62.0 in | Wt 163.0 lb

## 2015-06-25 DIAGNOSIS — G43109 Migraine with aura, not intractable, without status migrainosus: Secondary | ICD-10-CM

## 2015-06-25 DIAGNOSIS — R350 Frequency of micturition: Secondary | ICD-10-CM | POA: Diagnosis not present

## 2015-06-25 DIAGNOSIS — N760 Acute vaginitis: Secondary | ICD-10-CM

## 2015-06-25 DIAGNOSIS — M79642 Pain in left hand: Secondary | ICD-10-CM

## 2015-06-25 DIAGNOSIS — M79641 Pain in right hand: Secondary | ICD-10-CM | POA: Diagnosis not present

## 2015-06-25 DIAGNOSIS — F17200 Nicotine dependence, unspecified, uncomplicated: Secondary | ICD-10-CM

## 2015-06-25 DIAGNOSIS — I10 Essential (primary) hypertension: Secondary | ICD-10-CM

## 2015-06-25 DIAGNOSIS — F418 Other specified anxiety disorders: Secondary | ICD-10-CM

## 2015-06-25 DIAGNOSIS — E669 Obesity, unspecified: Secondary | ICD-10-CM

## 2015-06-25 LAB — POCT URINALYSIS DIPSTICK
Bilirubin, UA: NEGATIVE
Blood, UA: NEGATIVE
Glucose, UA: NEGATIVE
KETONES UA: NEGATIVE
LEUKOCYTES UA: NEGATIVE
Nitrite, UA: NEGATIVE
PH UA: 6.5
PROTEIN UA: NEGATIVE
Spec Grav, UA: 1.025
UROBILINOGEN UA: 0.2

## 2015-06-25 MED ORDER — TIOTROPIUM BROMIDE MONOHYDRATE 18 MCG IN CAPS: 18.0000 ug | ORAL_CAPSULE | Freq: Every day | RESPIRATORY_TRACT | Status: DC

## 2015-06-25 MED ORDER — ERGOCALCIFEROL 1.25 MG (50000 UT) PO CAPS
50000.0000 [IU] | ORAL_CAPSULE | ORAL | Status: DC
Start: 2015-06-25 — End: 2016-03-17

## 2015-06-25 MED ORDER — BUPROPION HCL ER (XL) 150 MG PO TB24: 150.0000 mg | ORAL_TABLET | Freq: Every day | ORAL | Status: DC

## 2015-06-25 MED FILL — SPIRIVA 18 MCG CP-HANDIHALE: 18 | 90 days supply | Qty: 90 | Fill #0

## 2015-06-25 MED FILL — BUPROPION HCL XL 150 MG TAB: 150 | 90 days supply | Qty: 90 | Fill #0

## 2015-06-25 MED FILL — VIT D2 1.25 MG (50,000 UNIT: 1.25 MG | 84 days supply | Qty: 12 | Fill #0

## 2015-06-25 NOTE — Assessment & Plan Note (Signed)
Uncontrolled , start Wellbutrin and return to therapy

## 2015-06-25 NOTE — Assessment & Plan Note (Signed)
Controlled, no change in medication DASH diet and commitment to daily physical activity for a minimum of 30 minutes discussed and encouraged, as a part of hypertension management. The importance of attaining a healthy weight is also discussed.  BP/Weight 06/25/2015 02/28/2015 08/22/2014 07/23/2014 07/16/2014 0000000 Q000111Q  Systolic BP 123XX123 0000000 A999333 0000000 [id number]  Diastolic BP 64 92 82 82 65 69 90  Wt. (Lbs) 163 172 159.4 163 163 163 160  BMI 29.81 31.45 29.15 29.81 29.81 29.81 29.26

## 2015-06-25 NOTE — Assessment & Plan Note (Signed)
Intolerant of gabapentin dose of 1600mg  daily, will break 800 mg tab in half and start 400 mg at bedtime

## 2015-06-25 NOTE — Progress Notes (Signed)
Subjective:    Patient ID: Zoe Bailey, female    DOB: 11/21/1965, 50 y.o.   MRN: [id number]  HPI   Zoe Bailey     MRN: [id number]      DOB: September 28, 1965   HPI Zoe Bailey is here for follow up and re-evaluation of chronic medical conditions, medication management and review of any available recent lab and radiology data.  Preventive health is updated, specifically  Cancer screening and Immunization.   Questions or concerns regarding consultations or procedures which the PT has had in the interim are  addressed. The PT denies any adverse reactions to current medications since the last visit.    ROS Denies recent fever or chills. Denies sinus pressure, nasal congestion, ear pain or sore throat. Denies chest congestion, productive cough or wheezing. Denies chest pains, palpitations and leg swelling Denies abdominal pain, nausea, vomiting,diarrhea or constipation.   C/o urinary frequency and malodorous vaginal d/c , she does douche. C/o bilateral hand pain and numbness, awakens patient Denies headaches, seizures, numbness, or tingling. C/o depression, anxiety denies  insomnia. Denies skin break down or rash.   PE  BP 122/64 mmHg  Pulse 78  Resp 18  Ht 5\' 2"  (1.575 m)  Wt 163 lb (73.936 kg)  BMI 29.81 kg/m2  SpO2 96%  Patient alert and oriented and in no cardiopulmonary distress.  HEENT: No facial asymmetry, EOMI,   oropharynx pink and moist.  Neck supple no JVD, no mass.  Chest: Clear to auscultation bilaterally.decreased though adequate air entry  CVS: S1, S2 no murmurs, no S3.Regular rate.  ABD: Soft non tender.No suprapubic or renal angle tenderness  Ext: No edema  MS: Adequate ROM spine, shoulders, hips and knees.  Skin: Intact, no ulcerations or rash noted.  Psych: Good eye contact, normal affect. Memory intact not anxious mildly  depressed appearing.  CNS: CN 2-12 intact, power,  normal throughout.no focal deficits noted.   Assessment & Plan  Essential  hypertension Controlled, no change in medication DASH diet and commitment to daily physical activity for a minimum of 30 minutes discussed and encouraged, as a part of hypertension management. The importance of attaining a healthy weight is also discussed.  BP/Weight 06/25/2015 02/28/2015 08/22/2014 07/23/2014 07/16/2014 0000000 Q000111Q  Systolic BP 123XX123 0000000 A999333 0000000 [id number]  Diastolic BP 64 92 82 82 65 69 90  Wt. (Lbs) 163 172 159.4 163 163 163 160  BMI 29.81 31.45 29.15 29.81 29.81 29.81 29.26        Depression with anxiety Uncontrolled , start Wellbutrin and return to therapy  NICOTINE ADDICTION Patient counseled for approximately 5 minutes regarding the health risks of ongoing nicotine use, specifically all types of cancer, heart disease, stroke and respiratory failure. The options available for help with cessation ,the behavioral changes to assist the process, and the option to either gradully reduce usage  Or abruptly stop.is also discussed. Pt is also encouraged to set specific goals in number of cigarettes used daily, as well as to set a quit date.  Number of cigarettes/cigars currently smoking daily:15 Give info for health dept and QUIT NOW   Migraine Intolerant of gabapentin dose of 1600mg  daily, will break 800 mg tab in half and start 400 mg at bedtime  Bilateral hand pain Worsening bilateral hand pain, with weakness and numbness, has had carpal tunnel release of right hand twice again symptomatic but worse in right hand, refer to hand surgeon for re eval and management Star low dose  gabapentin at bedtime, intolerant of current dose prescribed for headache prophylaxis  Obesity (BMI 30.0-34.9) Improved. Pt applauded on succesful weight loss through lifestyle change, and encouraged to continue same. Weight loss goal set for the next several months.   Vaginitis and vulvovaginitis Likely BV, self collected specimen to be treated once the result is available  Urinary  frequency Symptomatic, urine in office is negative for infection       Review of Systems     Objective:   Physical Exam        Assessment & Plan:

## 2015-06-25 NOTE — Assessment & Plan Note (Signed)
Patient counseled for approximately 5 minutes regarding the health risks of ongoing nicotine use, specifically all types of cancer, heart disease, stroke and respiratory failure. The options available for help with cessation ,the behavioral changes to assist the process, and the option to either gradully reduce usage  Or abruptly stop.is also discussed. Pt is also encouraged to set specific goals in number of cigarettes used daily, as well as to set a quit date.  Number of cigarettes/cigars currently smoking daily:15 Give info for health dept and QUIT NOW

## 2015-06-25 NOTE — Patient Instructions (Addendum)
F/u in 8 to 10 weeks,  Call if you need me sooner  New for nerve pain and to help prevent migraines, gabapentin 400 mg at bedtime, call if you want this sent, in the meantime break the 800 mg tab in half and take half daily  You are referred to hand surgeon , Dr Amedeo Plenty re hand pain  New for depression and to help with smoking cessation is wellbutrin 150 mg daily Call [address] for help Info on classes at health dept , free also available  Urine is not infected, we will contact you with results from swab sent today  New to help with breathing is spiriva   Labs are excellent  New is once weekly vit D capsule

## 2015-06-27 ENCOUNTER — Encounter: Payer: Self-pay | Admitting: Family Medicine

## 2015-06-27 ENCOUNTER — Other Ambulatory Visit: Payer: Self-pay | Admitting: Family Medicine

## 2015-06-27 DIAGNOSIS — R350 Frequency of micturition: Secondary | ICD-10-CM | POA: Insufficient documentation

## 2015-06-27 LAB — CERVICOVAGINAL ANCILLARY ONLY: Wet Prep (BD Affirm): POSITIVE — AB

## 2015-06-27 MED ORDER — METRONIDAZOLE 500 MG PO TABS: 500.0000 mg | ORAL_TABLET | Freq: Two times a day (BID) | ORAL | Status: DC

## 2015-06-27 MED ORDER — FLUCONAZOLE 150 MG PO TABS
ORAL_TABLET | ORAL | Status: DC
Start: 2015-06-27 — End: 2016-03-17

## 2015-06-27 MED FILL — FLUCONAZOLE 150 MG TABLET: 150 | 1 days supply | Qty: 1 | Fill #0

## 2015-06-27 MED FILL — metroNIDAZOLE 500 MG TABS: 500 | 7 days supply | Qty: 14 | Fill #0

## 2015-06-27 NOTE — Assessment & Plan Note (Addendum)
Worsening bilateral hand pain, with weakness and numbness, has had carpal tunnel release of right hand twice again symptomatic but worse in right hand, refer to hand surgeon for re eval and management Star low dose gabapentin at bedtime, intolerant of current dose prescribed for headache prophylaxis

## 2015-06-27 NOTE — Assessment & Plan Note (Signed)
Improved. Pt applauded on succesful weight loss through lifestyle change, and encouraged to continue same. Weight loss goal set for the next several months.  

## 2015-06-27 NOTE — Assessment & Plan Note (Signed)
Likely BV, self collected specimen to be treated once the result is available

## 2015-06-27 NOTE — Assessment & Plan Note (Signed)
Symptomatic, urine in office is negative for infection

## 2015-07-08 ENCOUNTER — Telehealth: Payer: Self-pay

## 2015-07-08 DIAGNOSIS — M19031 Primary osteoarthritis, right wrist: Secondary | ICD-10-CM | POA: Diagnosis not present

## 2015-07-08 DIAGNOSIS — M19032 Primary osteoarthritis, left wrist: Secondary | ICD-10-CM | POA: Diagnosis not present

## 2015-07-08 DIAGNOSIS — G5603 Carpal tunnel syndrome, bilateral upper limbs: Secondary | ICD-10-CM | POA: Diagnosis not present

## 2015-07-08 DIAGNOSIS — E559 Vitamin D deficiency, unspecified: Secondary | ICD-10-CM

## 2015-07-08 MED FILL — DICLOFENAC SOD EC 75 MG TAB: 75 | 45 days supply | Qty: 45 | Fill #1

## 2015-07-08 NOTE — Telephone Encounter (Signed)
Called patient and left message for them to return call at the office   

## 2015-07-08 NOTE — Telephone Encounter (Signed)
Only 12 caps were dispensed, so that's one good thing discontinue the refill please Ask pt to get vit D level drawn in 2 weeks please

## 2015-07-08 NOTE — Telephone Encounter (Signed)
They found out when pt called for a early refill that she had been taking the vitamin d 50000 units daily instead of once a week for the past 30 days. They wanted you to know. Patient is aware now that its a once weekly drug but the pharmacist told her to wait on [place] to call her back to let her know when to restart the drug (if it was ok to start now taking once weekly or if you wanted her to hold it for a few weeks since she had been taking so much) Please advise

## 2015-07-09 NOTE — Addendum Note (Signed)
Addended by: Eual Fines on: 07/09/2015 08:54 AM   Modules accepted: Orders

## 2015-07-09 NOTE — Telephone Encounter (Signed)
Pt aware and lab order sent to lab

## 2015-08-20 MED FILL — DICLOFENAC SOD EC 75 MG TAB: 75 | 45 days supply | Qty: 45 | Fill #2

## 2015-08-23 ENCOUNTER — Other Ambulatory Visit: Payer: Self-pay

## 2015-08-23 ENCOUNTER — Telehealth: Payer: Self-pay | Admitting: Family Medicine

## 2015-08-23 MED ORDER — GABAPENTIN 400 MG PO CAPS: 400.0000 mg | ORAL_CAPSULE | Freq: Every day | ORAL | Status: DC

## 2015-08-23 MED FILL — GABAPENTIN 400 MG CAPSULE: 400 | 90 days supply | Qty: 90 | Fill #0

## 2015-08-23 NOTE — Telephone Encounter (Signed)
Zoe Bailey is stating that [name] is saying that her gabapentin (NEURONTIN) 400 MG capsule  Was never called in in April and they have no record of it and she is out of medication, please advise?

## 2015-08-23 NOTE — Telephone Encounter (Signed)
Medication refilled

## 2015-08-27 ENCOUNTER — Other Ambulatory Visit: Payer: Self-pay | Admitting: Family Medicine

## 2015-08-27 ENCOUNTER — Ambulatory Visit (INDEPENDENT_AMBULATORY_CARE_PROVIDER_SITE_OTHER): Payer: 59 | Admitting: Family Medicine

## 2015-08-27 ENCOUNTER — Encounter: Payer: Self-pay | Admitting: Family Medicine

## 2015-08-27 VITALS — BP 122/78 | HR 78 | Resp 18 | Ht 62.0 in | Wt 164.0 lb

## 2015-08-27 DIAGNOSIS — Z1211 Encounter for screening for malignant neoplasm of colon: Secondary | ICD-10-CM

## 2015-08-27 DIAGNOSIS — F418 Other specified anxiety disorders: Secondary | ICD-10-CM

## 2015-08-27 DIAGNOSIS — E669 Obesity, unspecified: Secondary | ICD-10-CM

## 2015-08-27 DIAGNOSIS — I10 Essential (primary) hypertension: Secondary | ICD-10-CM | POA: Diagnosis not present

## 2015-08-27 DIAGNOSIS — F411 Generalized anxiety disorder: Secondary | ICD-10-CM

## 2015-08-27 DIAGNOSIS — M79641 Pain in right hand: Secondary | ICD-10-CM

## 2015-08-27 DIAGNOSIS — E66811 Obesity, class 1: Secondary | ICD-10-CM

## 2015-08-27 DIAGNOSIS — M79642 Pain in left hand: Secondary | ICD-10-CM

## 2015-08-27 DIAGNOSIS — F17200 Nicotine dependence, unspecified, uncomplicated: Secondary | ICD-10-CM

## 2015-08-27 DIAGNOSIS — M2550 Pain in unspecified joint: Secondary | ICD-10-CM

## 2015-08-27 MED ORDER — BUPROPION HCL ER (XL) 150 MG PO TB24: 150.0000 mg | ORAL_TABLET | Freq: Every day | ORAL | Status: DC

## 2015-08-27 MED ORDER — DICLOFENAC SODIUM 75 MG PO TBEC: DELAYED_RELEASE_TABLET | ORAL | Status: DC

## 2015-08-27 MED ORDER — TRIAMTERENE-HCTZ 37.5-25 MG PO TABS: 1.0000 | ORAL_TABLET | Freq: Every day | ORAL | Status: DC

## 2015-08-27 MED ORDER — GABAPENTIN 400 MG PO CAPS: 400.0000 mg | ORAL_CAPSULE | Freq: Three times a day (TID) | ORAL | Status: DC

## 2015-08-27 MED FILL — TRIAMTERENE/HCTZ 37.5/25 TB: 37.5-25 | 90 days supply | Qty: 90 | Fill #0

## 2015-08-27 NOTE — Patient Instructions (Signed)
Annual physical exam in 5 month, call if you need me sooner.  Much improved depression  Please work on stopping smoking  You are referred to Dr Laural Golden for your colonoscopy  PLS get mammogram , this is past due  Once daily diclofenac and Three (total) gabapentin 400 mg  Capsules every day, may take one in the  Morning and two at night as discussed, if better for you)Please work on good  health habits so that your health will improve. 1. Commitment to daily physical activity for 30 to 60  minutes, if you are able to do this.  2. Commitment to wise food choices. Aim for half of your  food intake to be vegetable and fruit, one quarter starchy foods, and one quarter protein. Try to eat on a regular schedule  3 meals per day, snacking between meals should be limited to vegetables or fruits or small portions of nuts. 64 ounces of water per day is generally recommended, unless you have specific health conditions, like heart failure or kidney failure where you will need to limit fluid intake.  3. Commitment to sufficient and a  good quality of physical and mental rest daily, generally between 6 to 8 hours per day.  WITH PERSISTANCE AND PERSEVERANCE, THE IMPOSSIBLE , BECOMES THE NORM! Thank you  for choosing [facility]. We consider it a privelige to serve you.  Delivering excellent health care in a caring and  compassionate way is our goal.  Partnering with you,  so that together we can achieve this goal is our strategy.     You Can Quit Smoking If you are ready to quit smoking or are thinking about it, congratulations! You have chosen to help yourself be healthier and live longer! There are lots of different ways to quit smoking. Nicotine gum, nicotine patches, a nicotine inhaler, or nicotine nasal spray can help with physical craving. Hypnosis, support groups, and medicines help break the habit of smoking. TIPS TO GET OFF AND STAY OFF CIGARETTES  Learn to predict your moods. Do  not let a bad situation be your excuse to have a cigarette. Some situations in your life might tempt you to have a cigarette.  Ask friends and co-workers not to smoke around you.  Make your home smoke-free.  Never have "just one" cigarette. It leads to wanting another and another. Remind yourself of your decision to quit.  On a card, make a list of your reasons for not smoking. Read it at least the same number of times a day as you have a cigarette. Tell yourself everyday, "I do not want to smoke. I choose not to smoke."  Ask someone at home or work to help you with your plan to quit smoking.  Have something planned after you eat or have a cup of coffee. Take a walk or get other exercise to perk you up. This will help to keep you from overeating.  Try a relaxation exercise to calm you down and decrease your stress. Remember, you may be tense and nervous the first two weeks after you quit. This will pass.  Find new activities to keep your hands busy. Play with a pen, coin, or rubber band. Doodle or draw things on paper.  Brush your teeth right after eating. This will help cut down the craving for the taste of tobacco after meals. You can try mouthwash too.  Try gum, breath mints, or diet candy to keep something in your mouth. IF YOU SMOKE AND  WANT TO QUIT:  Do not stock up on cigarettes. Never buy a carton. Wait until one pack is finished before you buy another.  Never carry cigarettes with you at work or at home.  Keep cigarettes as far away from you as possible. Leave them with someone else.  Never carry matches or a lighter with you.  Ask yourself, "Do I need this cigarette or is this just a reflex?"  Bet with someone that you can quit. Put cigarette money in a piggy bank every morning. If you smoke, you give up the money. If you do not smoke, by the end of the week, you keep the money.  Keep trying. It takes 21 days to change a habit!  Talk to your doctor about using medicines  to help you quit. These include nicotine replacement gum, lozenges, or skin patches.   This information is not intended to replace advice given to you by your health care provider. Make sure you discuss any questions you have with your health care provider.   Document Released: 12/20/2008 Document Revised: 05/18/2011 Document Reviewed: 12/20/2008 Elsevier Interactive Patient Education [employer].

## 2015-08-31 NOTE — Assessment & Plan Note (Signed)
Has bilateral carpal tunnel syndrome as well as  Bilateral wrist arthritis, all requiring surgery

## 2015-08-31 NOTE — Assessment & Plan Note (Signed)
Controlled, no change in medication DASH diet and commitment to daily physical activity for a minimum of 30 minutes discussed and encouraged, as a part of hypertension management. The importance of attaining a healthy weight is also discussed.  BP/Weight 08/27/2015 06/25/2015 02/28/2015 08/22/2014 07/23/2014 07/16/2014 0000000  Systolic BP 123XX123 123XX123 0000000 [id number] 0000000 [id number]  Diastolic BP 78 64 92 82 82 65 69  Wt. (Lbs) 164.04 163 172 159.4 163 163 163  BMI 30 29.81 31.45 29.15 29.81 29.81 29.81

## 2015-08-31 NOTE — Assessment & Plan Note (Signed)
Patient counseled for approximately 5 minutes regarding the health risks of ongoing nicotine use, specifically all types of cancer, heart disease, stroke and respiratory failure. The options available for help with cessation ,the behavioral changes to assist the process, and the option to either gradully reduce usage  Or abruptly stop.is also discussed. Pt is also encouraged to set specific goals in number of cigarettes used daily, as well as to set a quit date.  Number of cigarettes/cigars currently smoking daily: 15 to 20  

## 2015-08-31 NOTE — Assessment & Plan Note (Signed)
Improved. Pt applauded on succesful weight loss through lifestyle change, and encouraged to continue same. Weight loss goal set for the next several months.  

## 2015-08-31 NOTE — Progress Notes (Signed)
   Zoe Bailey     MRN: [id number]      DOB: 1965-12-31   HPI Ms. Sparks is here for follow up and re-evaluation of chronic medical conditions, medication management and review of any available recent lab and radiology data.  Preventive health is updated, specifically  Cancer screening and Immunization.   Questions or concerns regarding consultations or procedures which the PT has had in the interim are  Addressed.Very disappointed that there is no quick fix for her hand pain, states will have to wait until retirement The PT denies any adverse reactions to current medications since the last visit. Wants gabapentin dose increased as higher dose was well tolerated and  More effective for headache and pain control    ROS Denies recent fever or chills. Denies sinus pressure, nasal congestion, ear pain or sore throat. Denies chest congestion, productive cough or wheezing. Denies chest pains, palpitations and leg swelling Denies abdominal pain, nausea, vomiting,diarrhea or constipation.   Denies dysuria, frequency, hesitancy or incontinence. Denies headaches, seizures, numbness, or tingling. Denies depression, anxiety or insomnia. Denies skin break down or rash.   PE  BP 122/78 mmHg  Pulse 78  Resp 18  Ht 5\' 2"  (1.575 m)  Wt 164 lb 0.6 oz (74.408 kg)  BMI 30.00 kg/m2  SpO2 97%  Patient alert and oriented and in no cardiopulmonary distress.  HEENT: No facial asymmetry, EOMI,   oropharynx pink and moist.  Neck supple no JVD, no mass.  Chest: Clear to auscultation bilaterally.  CVS: S1, S2 no murmurs, no S3.Regular rate.  ABD: Soft non tender.   Ext: No edema  MS: Adequate ROM spine, shoulders, hips and knees.  Skin: Intact, no ulcerations or rash noted.  Psych: Good eye contact, normal affect. Memory intact not anxious or depressed appearing.  CNS: CN 2-12 intact, power,  normal throughout.no focal deficits noted.   Assessment & Plan  Essential hypertension Controlled, no  change in medication DASH diet and commitment to daily physical activity for a minimum of 30 minutes discussed and encouraged, as a part of hypertension management. The importance of attaining a healthy weight is also discussed.  BP/Weight 08/27/2015 06/25/2015 02/28/2015 08/22/2014 07/23/2014 07/16/2014 0000000  Systolic BP 123XX123 123XX123 0000000 [id number] 0000000 [id number]  Diastolic BP 78 64 92 82 82 65 69  Wt. (Lbs) 164.04 163 172 159.4 163 163 163  BMI 30 29.81 31.45 29.15 29.81 29.81 29.81        GAD (generalized anxiety disorder) Controlled, no change in medication   Depression with anxiety Marked improved on current medication, continue same  NICOTINE ADDICTION Patient counseled for approximately 5 minutes regarding the health risks of ongoing nicotine use, specifically all types of cancer, heart disease, stroke and respiratory failure. The options available for help with cessation ,the behavioral changes to assist the process, and the option to either gradully reduce usage  Or abruptly stop.is also discussed. Pt is also encouraged to set specific goals in number of cigarettes used daily, as well as to set a quit date.  Number of cigarettes/cigars currently smoking daily: 15 to 20  Obesity (BMI 30.0-34.9) Improved. Pt applauded on succesful weight loss through lifestyle change, and encouraged to continue same. Weight loss goal set for the next several months.   Joint pain Managed with diclofenac and gabapentin and flexeril  Bilateral hand pain Has bilateral carpal tunnel syndrome as well as  Bilateral wrist arthritis, all requiring surgery

## 2015-08-31 NOTE — Assessment & Plan Note (Signed)
Controlled, no change in medication  

## 2015-08-31 NOTE — Assessment & Plan Note (Signed)
Managed with diclofenac and gabapentin and flexeril

## 2015-08-31 NOTE — Assessment & Plan Note (Signed)
Marked improved on current medication, continue same

## 2015-09-02 ENCOUNTER — Encounter (INDEPENDENT_AMBULATORY_CARE_PROVIDER_SITE_OTHER): Payer: Self-pay | Admitting: *Deleted

## 2015-09-24 MED FILL — BUPROPION HCL XL 150 MG TAB: 150 | 90 days supply | Qty: 90 | Fill #1

## 2015-09-27 MED FILL — GABAPENTIN 400 MG CAPSULE: 400 | 90 days supply | Qty: 270 | Fill #0

## 2015-10-07 MED FILL — DICLOFENAC SOD EC 75 MG TAB: 75 | 90 days supply | Qty: 90 | Fill #0

## 2015-10-07 MED FILL — CYCLOBENZAPRINE 10 MG TAB: 10 | 10 days supply | Qty: 30 | Fill #2

## 2015-11-07 ENCOUNTER — Other Ambulatory Visit: Payer: Self-pay | Admitting: Family Medicine

## 2015-11-12 MED FILL — CYCLOBENZAPRINE 10 MG TAB: 10 | 10 days supply | Qty: 30 | Fill #0

## 2015-11-28 MED FILL — TRIAMTERENE-HCTZ 37.5-25 MG: 37.5-25 | 90 days supply | Qty: 90 | Fill #1

## 2015-12-09 MED FILL — CYCLOBENZAPRINE 10 MG TAB: 10 | 10 days supply | Qty: 30 | Fill #1

## 2015-12-10 ENCOUNTER — Other Ambulatory Visit: Payer: Self-pay | Admitting: Family Medicine

## 2015-12-10 DIAGNOSIS — Z1231 Encounter for screening mammogram for malignant neoplasm of breast: Secondary | ICD-10-CM

## 2015-12-12 ENCOUNTER — Ambulatory Visit (HOSPITAL_COMMUNITY)
Admission: RE | Admit: 2015-12-12 | Discharge: 2015-12-12 | Disposition: A | Discharge status: Home / Self Care | Payer: 59 | Source: Ambulatory Visit | Attending: Family Medicine | Admitting: Family Medicine

## 2015-12-12 DIAGNOSIS — Z1231 Encounter for screening mammogram for malignant neoplasm of breast: Secondary | ICD-10-CM | POA: Diagnosis not present

## 2015-12-19 DIAGNOSIS — H5213 Myopia, bilateral: Secondary | ICD-10-CM | POA: Diagnosis not present

## 2015-12-26 ENCOUNTER — Ambulatory Visit (INDEPENDENT_AMBULATORY_CARE_PROVIDER_SITE_OTHER): Payer: 59

## 2015-12-26 DIAGNOSIS — N3001 Acute cystitis with hematuria: Secondary | ICD-10-CM

## 2015-12-26 LAB — POCT URINALYSIS DIPSTICK
BILIRUBIN UA: NEGATIVE
Glucose, UA: NEGATIVE
Ketones, UA: NEGATIVE
NITRITE UA: POSITIVE
PH UA: 6
Protein, UA: 30
SPEC GRAV UA: 1.025
UROBILINOGEN UA: 0.2

## 2015-12-26 MED ORDER — CIPROFLOXACIN HCL 500 MG PO TABS: 500.0000 mg | ORAL_TABLET | Freq: Two times a day (BID) | ORAL | 0 refills | Status: DC

## 2015-12-26 NOTE — Progress Notes (Signed)
Patient in for nurse visit for UTI symptoms.   Dipstick urine preformed.  Urine sent for culture.  Standing order for Cipro 500mg  1 tablet BID initated and sent to requested pharmacy

## 2015-12-27 MED FILL — GABAPENTIN 400 MG CAPSULE: 400 | 90 days supply | Qty: 270 | Fill #1

## 2015-12-27 MED FILL — BUPROPION HCL XL 150 MG TAB: 150 | 90 days supply | Qty: 90 | Fill #0

## 2015-12-29 LAB — URINE CULTURE

## 2016-01-08 MED FILL — DICLOFENAC SOD 75 MG TAB EC: 75 | 90 days supply | Qty: 90 | Fill #1

## 2016-01-09 MED FILL — CYCLOBENZAPRINE 10 MG TAB: 10 | 10 days supply | Qty: 30 | Fill #2

## 2016-01-29 ENCOUNTER — Encounter: Payer: 59 | Admitting: Family Medicine

## 2016-02-17 ENCOUNTER — Other Ambulatory Visit: Payer: Self-pay | Admitting: Family Medicine

## 2016-02-17 MED FILL — CYCLOBENZAPRINE 10 MG TAB: 10 | 10 days supply | Qty: 30 | Fill #0

## 2016-03-04 ENCOUNTER — Other Ambulatory Visit: Payer: Self-pay

## 2016-03-04 MED ORDER — TRIAMTERENE-HCTZ 37.5-25 MG PO TABS: 1.0000 | ORAL_TABLET | Freq: Every day | ORAL | 1 refills | Status: DC

## 2016-03-04 MED FILL — TRIAMTERENE-HCTZ 37.5-25 MG: 37.5-25 | 90 days supply | Qty: 90 | Fill #0

## 2016-03-10 ENCOUNTER — Encounter: Payer: 59 | Admitting: Family Medicine

## 2016-03-17 ENCOUNTER — Ambulatory Visit (INDEPENDENT_AMBULATORY_CARE_PROVIDER_SITE_OTHER): Payer: 59 | Admitting: Family Medicine

## 2016-03-17 ENCOUNTER — Encounter: Payer: Self-pay | Admitting: Family Medicine

## 2016-03-17 VITALS — BP 158/92 | HR 71 | Resp 16 | Ht 62.0 in | Wt 169.0 lb

## 2016-03-17 DIAGNOSIS — Z0000 Encounter for general adult medical examination without abnormal findings: Secondary | ICD-10-CM

## 2016-03-17 DIAGNOSIS — I10 Essential (primary) hypertension: Secondary | ICD-10-CM | POA: Diagnosis not present

## 2016-03-17 DIAGNOSIS — Z1211 Encounter for screening for malignant neoplasm of colon: Secondary | ICD-10-CM

## 2016-03-17 DIAGNOSIS — F17200 Nicotine dependence, unspecified, uncomplicated: Secondary | ICD-10-CM | POA: Diagnosis not present

## 2016-03-17 DIAGNOSIS — E669 Obesity, unspecified: Secondary | ICD-10-CM

## 2016-03-17 DIAGNOSIS — F418 Other specified anxiety disorders: Secondary | ICD-10-CM

## 2016-03-17 DIAGNOSIS — G43109 Migraine with aura, not intractable, without status migrainosus: Secondary | ICD-10-CM

## 2016-03-17 DIAGNOSIS — M79642 Pain in left hand: Secondary | ICD-10-CM

## 2016-03-17 DIAGNOSIS — M79641 Pain in right hand: Secondary | ICD-10-CM

## 2016-03-17 MED ORDER — BUPROPION HCL ER (XL) 150 MG PO TB24: 150.0000 mg | ORAL_TABLET | Freq: Every day | ORAL | 1 refills | Status: DC

## 2016-03-17 MED ORDER — GABAPENTIN 400 MG PO CAPS: 400.0000 mg | ORAL_CAPSULE | Freq: Three times a day (TID) | ORAL | 1 refills | Status: DC

## 2016-03-17 MED ORDER — MONTELUKAST SODIUM 10 MG PO TABS: 10.0000 mg | ORAL_TABLET | Freq: Every day | ORAL | 2 refills | Status: DC

## 2016-03-17 MED ORDER — TRIAMTERENE-HCTZ 75-50 MG PO TABS: 1.0000 | ORAL_TABLET | Freq: Every day | ORAL | 2 refills | Status: DC

## 2016-03-17 MED ORDER — CYCLOBENZAPRINE HCL 10 MG PO TABS: ORAL_TABLET | ORAL | 99 refills | Status: DC

## 2016-03-17 MED FILL — MONTELUKAST SOD 10 MG TAB: 10 | 90 days supply | Qty: 90 | Fill #0

## 2016-03-17 MED FILL — BUPROPION HCL XL 150 MG TAB: 150 | 90 days supply | Qty: 90 | Fill #0

## 2016-03-17 MED FILL — CYCLOBENZAPRINE 10 MG TAB: 10 | 10 days supply | Qty: 30 | Fill #0

## 2016-03-17 NOTE — Assessment & Plan Note (Signed)

## 2016-03-17 NOTE — Patient Instructions (Addendum)
F/u in 5 weeks for re eval of bP,   Blood pressure is high, increase medication dose to triamterene 25 mg tWO tablets daily till done  New script is being sent in for triamterene 50 mg ONE daily, this is sent in  You are referred to headache clinic  Please call and schedule colonoscopy with Dr Laural Golden past due  For cough, and post nasal drainage, start once daily singulair  Please work on stopping smoking altogether , best for you  See what help you can get for y0ur Dad  Fasting labs for next visit   Steps to Quit Smoking Smoking tobacco can be bad for your health. It can also affect almost every organ in your body. Smoking puts you and people around you at risk for many serious long-lasting (chronic) diseases. Quitting smoking is hard, but it is one of the best things that you can do for your health. It is never too late to quit. What are the benefits of quitting smoking? When you quit smoking, you lower your risk for getting serious diseases and conditions. They can include:  Lung cancer or lung disease.  Heart disease.  Stroke.  Heart attack.  Not being able to have children (infertility).  Weak bones (osteoporosis) and broken bones (fractures). If you have coughing, wheezing, and shortness of breath, those symptoms may get better when you quit. You may also get sick less often. If you are pregnant, quitting smoking can help to lower your chances of having a baby of low birth weight. What can I do to help me quit smoking? Talk with your doctor about what can help you quit smoking. Some things you can do (strategies) include:  Quitting smoking totally, instead of slowly cutting back how much you smoke over a period of time.  Going to in-person counseling. You are more likely to quit if you go to many counseling sessions.  Using resources and support systems, such as:  Online chats with a Social worker.  Phone quitlines.  Printed Furniture conservator/restorer.  Support groups or  group counseling.  Text messaging programs.  Mobile phone apps or applications.  Taking medicines. Some of these medicines may have nicotine in them. If you are pregnant or breastfeeding, do not take any medicines to quit smoking unless your doctor says it is okay. Talk with your doctor about counseling or other things that can help you. Talk with your doctor about using more than one strategy at the same time, such as taking medicines while you are also going to in-person counseling. This can help make quitting easier. What things can I do to make it easier to quit? Quitting smoking might feel very hard at first, but there is a lot that you can do to make it easier. Take these steps:  Talk to your family and friends. Ask them to support and encourage you.  Call phone quitlines, reach out to support groups, or work with a Social worker.  Ask people who smoke to not smoke around you.  Avoid places that make you want (trigger) to smoke, such as:  Bars.  Parties.  Smoke-break areas at work.  Spend time with people who do not smoke.  Lower the stress in your life. Stress can make you want to smoke. Try these things to help your stress:  Getting regular exercise.  Deep-breathing exercises.  Yoga.  Meditating.  Doing a body scan. To do this, close your eyes, focus on one area of your body at a time from head  to toe, and notice which parts of your body are tense. Try to relax the muscles in those areas.  Download or buy apps on your mobile phone or tablet that can help you stick to your quit plan. There are many free apps, such as QuitGuide from the [employer] Office manager for Disease Control and Prevention). You can find more support from smokefree.gov and other websites. This information is not intended to replace advice given to you by your health care provider. Make sure you discuss any questions you have with your health care provider. Document Released: 12/20/2008 Document Revised:  10/22/2015 Document Reviewed: 07/10/2014 Elsevier Interactive Patient Education  2017 [employer].

## 2016-03-17 NOTE — Assessment & Plan Note (Addendum)
Increased headache frequency and severity since Summer when her father with dementia started living with her, has benefited in the past from botox, requests re eval at headache clinic

## 2016-03-22 ENCOUNTER — Encounter: Payer: Self-pay | Admitting: Family Medicine

## 2016-03-22 NOTE — Assessment & Plan Note (Signed)
Increased anxiety due to stress at home reports that she has v been caring for her father with dementia x 6 months and rally needs help, and her daughter is not staying in the house intentionally as does not feel safe No interest in therapy currently, and not clearly indicated

## 2016-03-22 NOTE — Assessment & Plan Note (Signed)

## 2016-03-22 NOTE — Assessment & Plan Note (Signed)
Deteriorated. Patient re-educated about  the importance of commitment to a  minimum of 150 minutes of exercise per week.  The importance of healthy food choices with portion control discussed. Encouraged to start a food diary, count calories and to consider  joining a support group. Sample diet sheets offered. Goals set by the patient for the next several months.   Weight /BMI 03/17/2016 08/27/2015 06/25/2015  WEIGHT 169 lb 164 lb 0.6 oz 163 lb  HEIGHT 5\' 2"  5\' 2"  5\' 2"   BMI 30.91 kg/m2 30 kg/m2 29.81 kg/m2

## 2016-03-22 NOTE — Progress Notes (Signed)
Zoe Bailey     MRN: [id number]      DOB: [birth year]  HPI: Patient is in for annual physical exam. C/o increased stress due to having to care for parent with dementia x 6 months, and increased headache frequency and severity, also reports elevated blood pressure readings Recent labs, if available are reviewed. Immunization is reviewed , and  updated if needed.   PE: Pleasant  female, alert and oriented x 3, in no cardio-pulmonary distress. Afebrile. HEENT No facial trauma or asymetry. Sinuses non tender.  Extra occullar muscles intact, pupils equally reactive to light. External ears normal, tympanic membranes clear. Oropharynx moist, no exudate. Neck: supple, no adenopathy,JVD or thyromegaly.No bruits.  Chest: Clear to ascultation bilaterally.No crackles or wheezes. Non tender to palpation  Breast: No asymetry,no masses or lumps. No tenderness. No nipple discharge or inversion. No axillary or supraclavicular adenopathy  Cardiovascular system; Heart sounds normal,  S1 and  S2 ,no S3.  No murmur, or thrill. Apical beat not displaced Peripheral pulses normal.  Abdomen: Soft, non tender, no organomegaly or masses. No bruits. Bowel sounds normal. No guarding, tenderness or rebound.  Rectal:  Normal sphincter tone. No rectal mass. Guaiac negative stool.  GU: External genitalia normal female genitalia , normal female distribution of hair. No lesions. Urethral meatus normal in size, no  Prolapse, no lesions visibly  Present. Bladder non tender. Vagina pink and moist , with no visible lesions , discharge present . Adequate pelvic support no  cystocele or rectocele noted Cervix pink and appears healthy, no lesions or ulcerations noted, no discharge noted from os Uterus normal size, no adnexal masses, no cervical motion or adnexal tenderness.   Musculoskeletal exam: Full ROM of spine, hips , shoulders and knees. No deformity ,swelling or crepitus noted. No muscle  wasting or atrophy.   Neurologic: Cranial nerves 2 to 12 intact. Power, tone ,sensation and reflexes normal throughout. No disturbance in gait. No tremor.  Skin: Intact, no ulceration, erythema , scaling or rash noted. Pigmentation normal throughout  Psych; Normal mood and affect. Judgement and concentration normal   Assessment & Plan:  Annual physical exam Annual exam as documented. Counseling done  re healthy lifestyle involving commitment to 150 minutes exercise per week, heart healthy diet, and attaining healthy weight.The importance of adequate sleep also discussed. Regular seat belt use and home safety, is also discussed. Changes in health habits are decided on by the patient with goals and time frames  set for achieving them. Immunization and cancer screening needs are specifically addressed at this visit.   Migraine Increased headache frequency and severity since Summer when her father with dementia started living with her  [place] Patient counseled for approximately 5 minutes regarding the health risks of ongoing nicotine use, specifically all types of cancer, heart disease, stroke and respiratory failure. The options available for help with cessation ,the behavioral changes to assist the process, and the option to either gradully reduce usage  Or abruptly stop.is also discussed. Pt is also encouraged to set specific goals in number of cigarettes used daily, as well as to set a quit date.     Essential hypertension Uncontrolled, increase dose of triamterene, with f/u in 3 to 4 weeks DASH diet and commitment to daily physical activity for a minimum of 30 minutes discussed and encouraged, as a part of hypertension management. The importance of attaining a healthy weight is also discussed.  BP/Weight 03/17/2016 08/27/2015 06/25/2015 02/28/2015 08/22/2014 [id number]  Systolic  BP 158 122 122 160 [id number] 0000000 [id number]  Diastolic BP 92 78 64 92 82 82 65  Wt. (Lbs) 169  164.04 163 172 159.4 163 163  BMI 30.91 30 29.81 31.45 29.15 29.81 29.81       Depression with anxiety Increased anxiety due to stress at home reports that she has v been caring for her father with dementia x 6 months and rally needs help, and her daughter is not staying in the house intentionally as does not feel safe No interest in therapy currently, and not clearly indicated   Obesity (BMI 30.0-34.9) Deteriorated. Patient re-educated about  the importance of commitment to a  minimum of 150 minutes of exercise per week.  The importance of healthy food choices with portion control discussed. Encouraged to start a food diary, count calories and to consider  joining a support group. Sample diet sheets offered. Goals set by the patient for the next several months.   Weight /BMI 03/17/2016 08/27/2015 06/25/2015  WEIGHT 169 lb 164 lb 0.6 oz 163 lb  HEIGHT 5\' 2"  5\' 2"  5\' 2"   BMI 30.91 kg/m2 30 kg/m2 29.81 kg/m2

## 2016-03-22 NOTE — Assessment & Plan Note (Signed)
Uncontrolled, increase dose of triamterene, with f/u in 3 to 4 weeks DASH diet and commitment to daily physical activity for a minimum of 30 minutes discussed and encouraged, as a part of hypertension management. The importance of attaining a healthy weight is also discussed.  BP/Weight 03/17/2016 08/27/2015 06/25/2015 02/28/2015 08/22/2014 [id number]  Systolic BP 0000000 [id number] [id number] 0000000 [id number] 0000000 [id number]  Diastolic BP 92 78 64 92 82 82 65  Wt. (Lbs) 169 164.04 163 172 159.4 163 163  BMI 30.91 30 29.81 31.45 29.15 29.81 29.81

## 2016-03-23 LAB — POC HEMOCCULT BLD/STL (OFFICE/1-CARD/DIAGNOSTIC): FECAL OCCULT BLD: NEGATIVE

## 2016-03-23 NOTE — Addendum Note (Signed)
Addended by: Eual Fines on: 03/23/2016 08:41 AM   Modules accepted: Orders

## 2016-04-06 MED FILL — TRIAMTERENE/HCTZ 75/50 TAB: 75-50 | 90 days supply | Qty: 90 | Fill #0

## 2016-04-06 MED FILL — GABAPENTIN 400 MG CAPSULE: 400 | 90 days supply | Qty: 270 | Fill #0

## 2016-04-09 ENCOUNTER — Other Ambulatory Visit: Payer: Self-pay | Admitting: Family Medicine

## 2016-04-09 MED FILL — DICLOFENAC SOD 75 MG TAB EC: 75 | 90 days supply | Qty: 90 | Fill #0

## 2016-04-10 ENCOUNTER — Other Ambulatory Visit (HOSPITAL_COMMUNITY)
Admission: RE | Admit: 2016-04-10 | Discharge: 2016-04-10 | Disposition: A | Discharge status: Home / Self Care | Payer: 59 | Source: Ambulatory Visit | Attending: Family Medicine | Admitting: Family Medicine

## 2016-04-10 DIAGNOSIS — Z0000 Encounter for general adult medical examination without abnormal findings: Secondary | ICD-10-CM | POA: Insufficient documentation

## 2016-04-10 LAB — BASIC METABOLIC PANEL
Anion gap: 7 (ref 5–15)
BUN: 14 mg/dL (ref 6–20)
CALCIUM: 9.1 mg/dL (ref 8.9–10.3)
CO2: 30 mmol/L (ref 22–32)
CREATININE: 0.87 mg/dL (ref 0.44–1.00)
Chloride: 100 mmol/L — ABNORMAL LOW (ref 101–111)
GFR calc non Af Amer: >60 mL/min (ref >60)
Glucose, Bld: 90 mg/dL (ref 65–99)
Potassium: 4.2 mmol/L (ref 3.5–5.1)
SODIUM: 137 mmol/L (ref 135–145)

## 2016-04-10 LAB — CBC
HCT: 41.6 % (ref 36.0–46.0)
Hemoglobin: 14.2 g/dL (ref 12.0–15.0)
MCH: 31.7 pg (ref 26.0–34.0)
MCHC: 34.1 g/dL (ref 30.0–36.0)
MCV: 92.9 fL (ref 78.0–100.0)
Platelets: 253 10*3/uL (ref 150–400)
RBC: 4.48 MIL/uL (ref 3.87–5.11)
RDW: 13.7 % (ref 11.5–15.5)
WBC: 10.6 10*3/uL — ABNORMAL HIGH (ref 4.0–10.5)

## 2016-04-10 LAB — LIPID PANEL
CHOL/HDL RATIO: 2.8 ratio
Cholesterol: 189 mg/dL (ref 0–200)
HDL: 68 mg/dL (ref >40)
LDL CALC: 100 mg/dL — AB (ref 0–99)
TRIGLYCERIDES: 103 mg/dL (ref <150)
VLDL: 21 mg/dL (ref 0–40)

## 2016-04-10 LAB — TSH: TSH: 2.337 u[IU]/mL (ref 0.350–4.500)

## 2016-04-13 ENCOUNTER — Encounter: Payer: Self-pay | Admitting: Family Medicine

## 2016-04-13 ENCOUNTER — Other Ambulatory Visit: Payer: Self-pay | Admitting: Family Medicine

## 2016-04-13 LAB — VITAMIN D 25 HYDROXY (VIT D DEFICIENCY, FRACTURES): Vit D, 25-Hydroxy: 25.4 ng/mL — ABNORMAL LOW (ref 30.0–100.0)

## 2016-04-13 MED ORDER — ERGOCALCIFEROL 1.25 MG (50000 UT) PO CAPS: 50000.0000 [IU] | ORAL_CAPSULE | ORAL | 1 refills | Status: DC

## 2016-04-13 MED FILL — VIT D2 1.25 MG (50,000 UNIT: 1.25 MG | 84 days supply | Qty: 12 | Fill #0

## 2016-04-14 DIAGNOSIS — Z049 Encounter for examination and observation for unspecified reason: Secondary | ICD-10-CM | POA: Diagnosis not present

## 2016-04-14 DIAGNOSIS — G43719 Chronic migraine without aura, intractable, without status migrainosus: Secondary | ICD-10-CM | POA: Diagnosis not present

## 2016-04-20 DIAGNOSIS — M542 Cervicalgia: Secondary | ICD-10-CM | POA: Diagnosis not present

## 2016-04-20 DIAGNOSIS — G43719 Chronic migraine without aura, intractable, without status migrainosus: Secondary | ICD-10-CM | POA: Diagnosis not present

## 2016-04-20 DIAGNOSIS — M791 Myalgia: Secondary | ICD-10-CM | POA: Diagnosis not present

## 2016-04-20 DIAGNOSIS — R51 Headache: Secondary | ICD-10-CM | POA: Diagnosis not present

## 2016-04-20 MED FILL — PERPHENAZINE 4 MG TABLET: 4 | 4 days supply | Qty: 20 | Fill #0

## 2016-04-27 MED FILL — CYCLOBENZAPRINE 10 MG TAB: 10 | 10 days supply | Qty: 30 | Fill #1

## 2016-04-28 ENCOUNTER — Ambulatory Visit: Payer: 59 | Admitting: Family Medicine

## 2016-05-07 DIAGNOSIS — R51 Headache: Secondary | ICD-10-CM | POA: Diagnosis not present

## 2016-05-07 DIAGNOSIS — M791 Myalgia: Secondary | ICD-10-CM | POA: Diagnosis not present

## 2016-05-07 DIAGNOSIS — M542 Cervicalgia: Secondary | ICD-10-CM | POA: Diagnosis not present

## 2016-05-07 DIAGNOSIS — G43719 Chronic migraine without aura, intractable, without status migrainosus: Secondary | ICD-10-CM | POA: Diagnosis not present

## 2016-05-21 DIAGNOSIS — M791 Myalgia: Secondary | ICD-10-CM | POA: Diagnosis not present

## 2016-05-21 DIAGNOSIS — G43719 Chronic migraine without aura, intractable, without status migrainosus: Secondary | ICD-10-CM | POA: Diagnosis not present

## 2016-05-21 DIAGNOSIS — M542 Cervicalgia: Secondary | ICD-10-CM | POA: Diagnosis not present

## 2016-05-21 DIAGNOSIS — R51 Headache: Secondary | ICD-10-CM | POA: Diagnosis not present

## 2016-06-18 DIAGNOSIS — M542 Cervicalgia: Secondary | ICD-10-CM | POA: Diagnosis not present

## 2016-06-18 DIAGNOSIS — M791 Myalgia: Secondary | ICD-10-CM | POA: Diagnosis not present

## 2016-06-18 DIAGNOSIS — G43719 Chronic migraine without aura, intractable, without status migrainosus: Secondary | ICD-10-CM | POA: Diagnosis not present

## 2016-06-18 DIAGNOSIS — R51 Headache: Secondary | ICD-10-CM | POA: Diagnosis not present

## 2016-06-30 MED FILL — BUPROPION HCL XL 150 MG TAB: 150 | 90 days supply | Qty: 90 | Fill #1

## 2016-06-30 MED FILL — VIT D2 1.25 MG (50,000 UNIT: 1.25 MG | 84 days supply | Qty: 12 | Fill #1

## 2016-07-02 DIAGNOSIS — R51 Headache: Secondary | ICD-10-CM | POA: Diagnosis not present

## 2016-07-02 DIAGNOSIS — G43719 Chronic migraine without aura, intractable, without status migrainosus: Secondary | ICD-10-CM | POA: Diagnosis not present

## 2016-07-02 DIAGNOSIS — M791 Myalgia: Secondary | ICD-10-CM | POA: Diagnosis not present

## 2016-07-02 DIAGNOSIS — M542 Cervicalgia: Secondary | ICD-10-CM | POA: Diagnosis not present

## 2016-07-06 MED FILL — GABAPENTIN 400 MG CAPSULE: 400 | 90 days supply | Qty: 270 | Fill #1

## 2016-07-06 MED FILL — MONTELUKAST SOD 10 MG TAB: 10 | 90 days supply | Qty: 90 | Fill #1

## 2016-07-09 MED FILL — DICLOFENAC SOD 75 MG TAB EC: 75 | 90 days supply | Qty: 90 | Fill #1

## 2016-07-15 MED FILL — BOTOX 100 UNITS VIAL: 100 | 28 days supply | Qty: 2 | Fill #0

## 2016-07-21 MED FILL — TRIAMTERENE/HCTZ 75/50 TAB: 75-50 | 90 days supply | Qty: 90 | Fill #1

## 2016-07-24 DIAGNOSIS — M542 Cervicalgia: Secondary | ICD-10-CM | POA: Diagnosis not present

## 2016-07-24 DIAGNOSIS — G43719 Chronic migraine without aura, intractable, without status migrainosus: Secondary | ICD-10-CM | POA: Diagnosis not present

## 2016-07-24 DIAGNOSIS — M791 Myalgia: Secondary | ICD-10-CM | POA: Diagnosis not present

## 2016-07-24 DIAGNOSIS — G518 Other disorders of facial nerve: Secondary | ICD-10-CM | POA: Diagnosis not present

## 2016-08-10 DIAGNOSIS — M791 Myalgia: Secondary | ICD-10-CM | POA: Diagnosis not present

## 2016-08-10 DIAGNOSIS — R51 Headache: Secondary | ICD-10-CM | POA: Diagnosis not present

## 2016-08-10 DIAGNOSIS — G43719 Chronic migraine without aura, intractable, without status migrainosus: Secondary | ICD-10-CM | POA: Diagnosis not present

## 2016-08-10 DIAGNOSIS — M542 Cervicalgia: Secondary | ICD-10-CM | POA: Diagnosis not present

## 2016-09-11 DIAGNOSIS — R51 Headache: Secondary | ICD-10-CM | POA: Diagnosis not present

## 2016-09-11 DIAGNOSIS — M791 Myalgia: Secondary | ICD-10-CM | POA: Diagnosis not present

## 2016-09-11 DIAGNOSIS — M542 Cervicalgia: Secondary | ICD-10-CM | POA: Diagnosis not present

## 2016-09-11 DIAGNOSIS — G43719 Chronic migraine without aura, intractable, without status migrainosus: Secondary | ICD-10-CM | POA: Diagnosis not present

## 2016-09-30 ENCOUNTER — Other Ambulatory Visit: Payer: Self-pay | Admitting: Family Medicine

## 2016-09-30 DIAGNOSIS — F418 Other specified anxiety disorders: Secondary | ICD-10-CM

## 2016-10-02 MED FILL — BUPROPION HCL XL 150 MG TAB: 150 | 90 days supply | Qty: 90 | Fill #0

## 2016-10-08 ENCOUNTER — Other Ambulatory Visit: Payer: Self-pay | Admitting: Family Medicine

## 2016-10-08 DIAGNOSIS — M79642 Pain in left hand: Principal | ICD-10-CM

## 2016-10-08 DIAGNOSIS — M79641 Pain in right hand: Secondary | ICD-10-CM

## 2016-10-08 MED FILL — DICLOFENAC SOD 75 MG TAB EC: 75 | 90 days supply | Qty: 90 | Fill #0

## 2016-10-08 MED FILL — GABAPENTIN 400 MG CAPSULE: 400 | 90 days supply | Qty: 270 | Fill #0

## 2016-10-21 MED FILL — BOTOX 100 UNITS VIAL: 100 | 28 days supply | Qty: 2 | Fill #1

## 2016-10-26 MED FILL — TRIAMTERENE/HCTZ 75/50 TAB: 75-50 | 90 days supply | Qty: 90 | Fill #2

## 2016-10-28 DIAGNOSIS — M791 Myalgia: Secondary | ICD-10-CM | POA: Diagnosis not present

## 2016-10-28 DIAGNOSIS — G518 Other disorders of facial nerve: Secondary | ICD-10-CM | POA: Diagnosis not present

## 2016-10-28 DIAGNOSIS — M542 Cervicalgia: Secondary | ICD-10-CM | POA: Diagnosis not present

## 2016-10-28 DIAGNOSIS — G43719 Chronic migraine without aura, intractable, without status migrainosus: Secondary | ICD-10-CM | POA: Diagnosis not present

## 2016-10-30 MED FILL — CYCLOBENZAPRINE 10 MG TAB: 10 | 10 days supply | Qty: 30 | Fill #2

## 2016-11-22 IMAGING — MR MR FOOT*R* WO/W CM
4 of 9 series · 15 of 40 positions shown · IV contrast (14ml Multihance)
Comparison: None.

CLINICAL DATA: Bony protrusion. Soft tissue ganglion of the RIGHT
foot. Initial encounter.

EXAM:
MRI OF THE RIGHT FOREFOOT WITHOUT AND WITH CONTRAST
TECHNIQUE: Multiplanar, multisequence MR imaging of the RIGHT foot was
performed both before and after administration of intravenous
contrast.
CONTRAST:  14 mL MultiHance

[Series 7: T1 · axial · 3.0mm · 0.19mm/px · z∈[-100,+36]mm · 6 of 50 slices shown (1 of 2)]
[im 1/50]
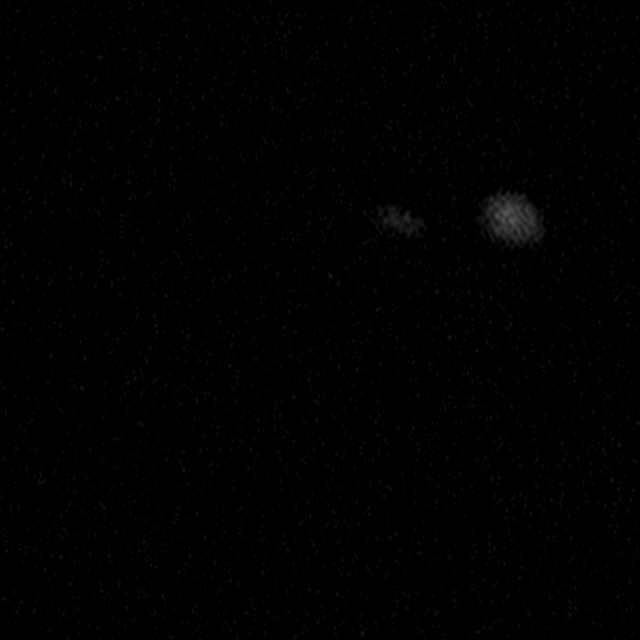
[im 9/50]
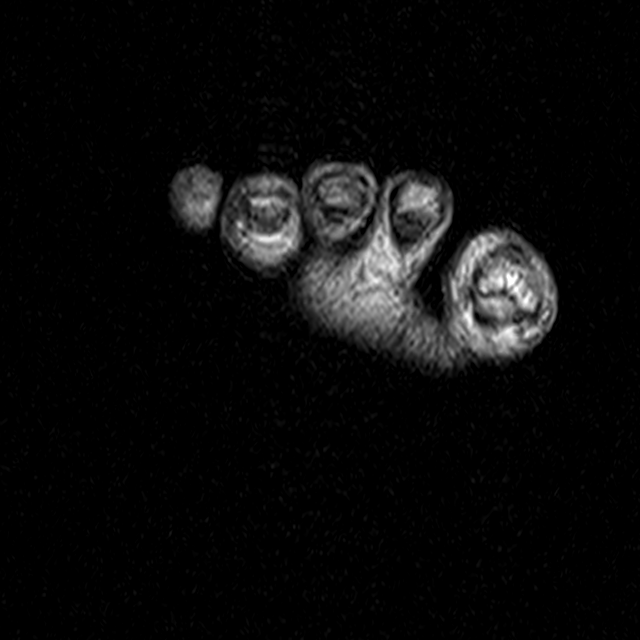
[im 17/50]
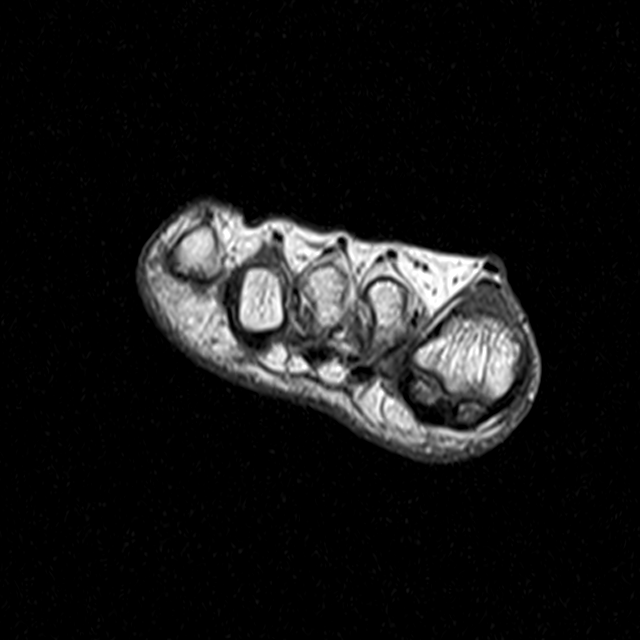
[im 25/50]
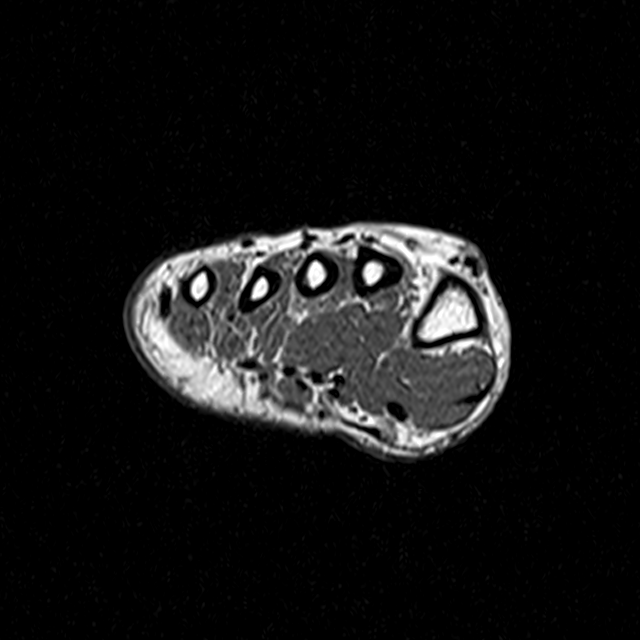
[im 33/50]
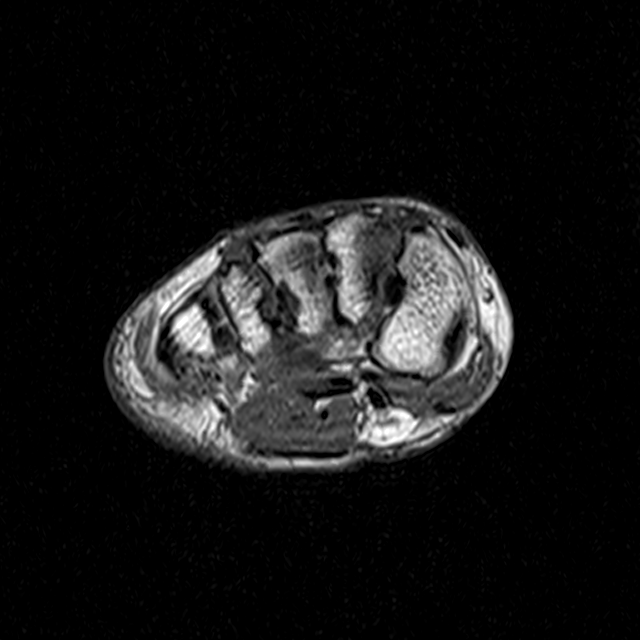
[im 41/50]
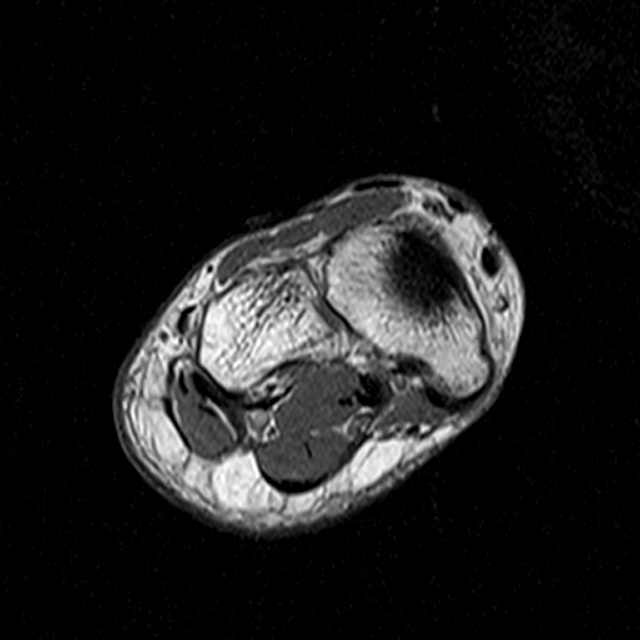

[Series 8: t1fs axial · axial · 3.0mm · 0.19mm/px · z∈[-69,+67]mm · 3 of 50 slices shown]
[im 10/50]
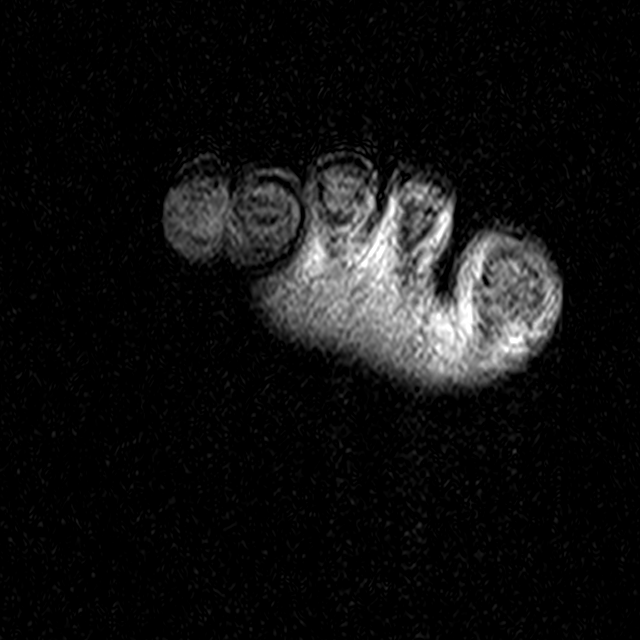
[im 30/50]
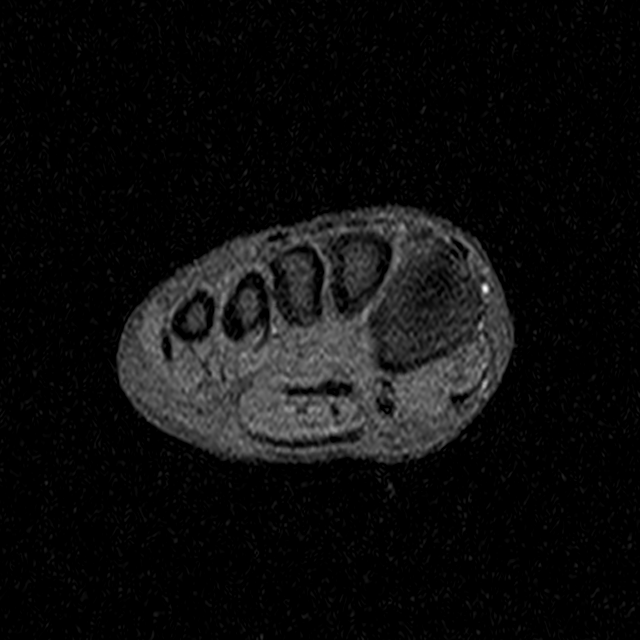
[im 50/50]
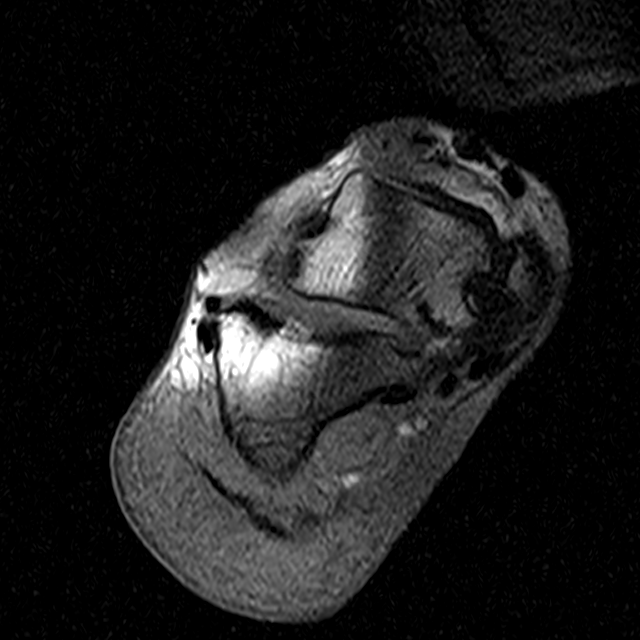

[Series 10: t2fs axial · axial · 3.0mm · 0.24mm/px · z∈[-69,+67]mm · 3 of 50 slices shown]
[im 10/50]
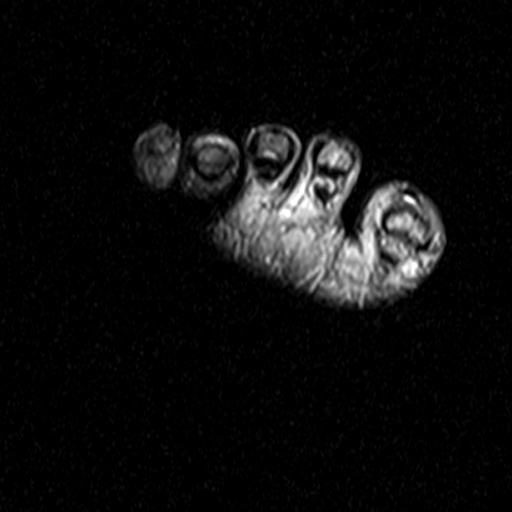
[im 30/50]
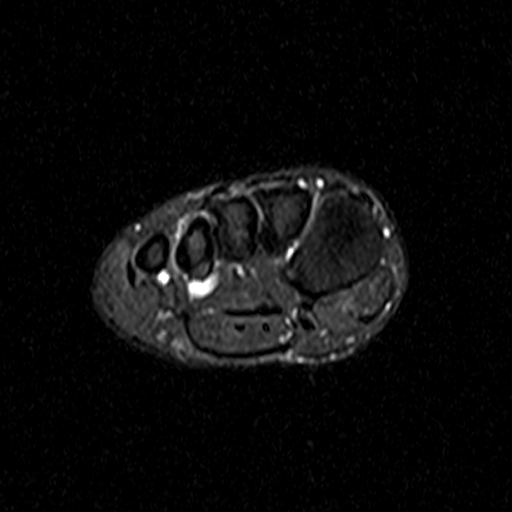
[im 50/50]
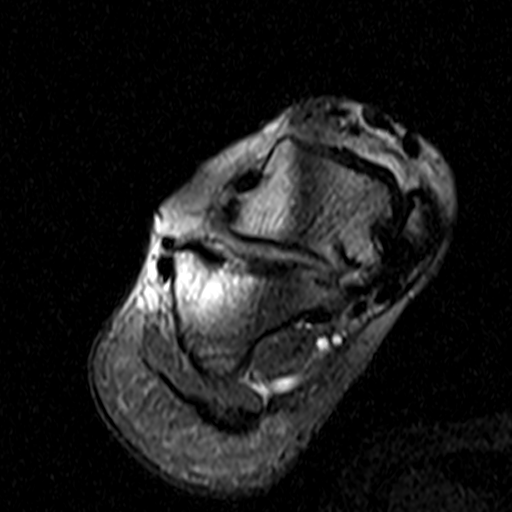

[Series 13: T1 · sagittal · 3.0mm · 0.34mm/px · 3 of 20 slices shown (2 of 2)]
[im 1/20]
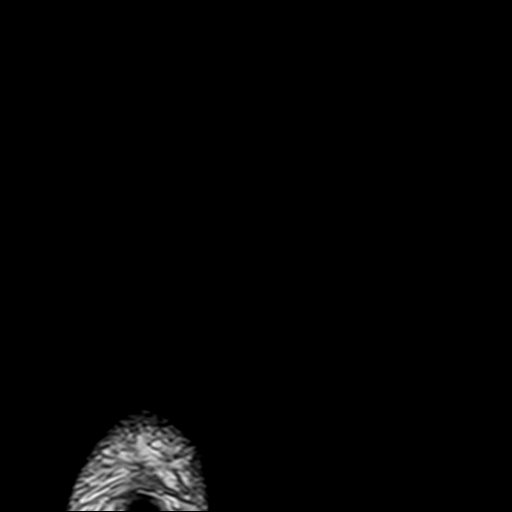
[im 10/20]
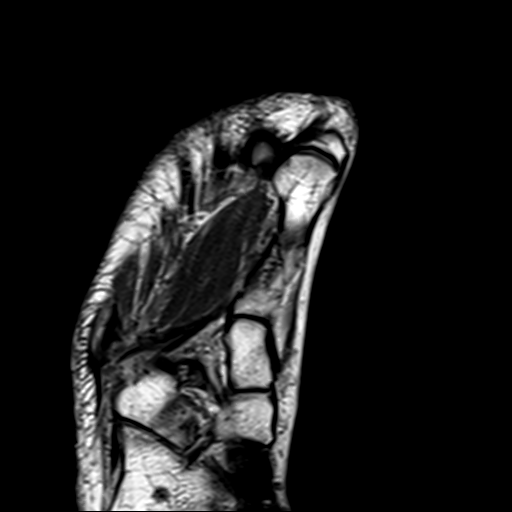
[im 20/20]
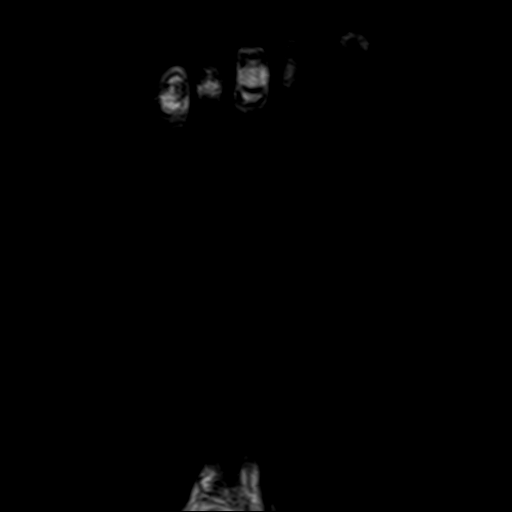

[15 of 40 positions shown; findings below may reference images not displayed]

FINDINGS: Dorsal spurring is present at the proximal intermetatarsal joint
involving the third and fourth metatarsal bases. Just distal to the
intermetatarsal joint, there is a small dorsal soft tissue ganglion
arising from the degenerated joint. Little if any enhancement is
present and this is most compatible with a ganglion cyst. Ganglion
cyst measures 1 cm in the long axis of the foot, 5 mm plantar to
dorsal and 7 mm transverse. This extends into the superficial
subcutaneous fat and is just lateral to the small toe extensor
tendon. Otherwise the forefoot appears normal. No destructive
osseous lesions. No stress reaction or stress fracture. First MTP
joint appears normal. Great toe sesamoid bones appear within normal
limits.
IMPRESSION: 10 mm x 7 mm x 5 mm ganglion cyst dorsal to the degenerated third
and fourth intermetatarsal joint.

## 2016-12-07 ENCOUNTER — Other Ambulatory Visit: Payer: Self-pay | Admitting: Family Medicine

## 2016-12-07 ENCOUNTER — Ambulatory Visit (INDEPENDENT_AMBULATORY_CARE_PROVIDER_SITE_OTHER): Payer: 59

## 2016-12-07 DIAGNOSIS — R309 Painful micturition, unspecified: Secondary | ICD-10-CM

## 2016-12-07 LAB — POCT URINALYSIS DIPSTICK
BILIRUBIN UA: NEGATIVE
GLUCOSE UA: NEGATIVE
Nitrite, UA: NEGATIVE
Protein, UA: 30
SPEC GRAV UA: 1.015 (ref 1.010–1.025)
Urobilinogen, UA: 1 E.U./dL
pH, UA: 6.5 (ref 5.0–8.0)

## 2016-12-07 MED ORDER — CIPROFLOXACIN HCL 500 MG PO TABS: 500.0000 mg | ORAL_TABLET | Freq: Two times a day (BID) | ORAL | 0 refills | Status: DC

## 2016-12-07 NOTE — Progress Notes (Signed)
cipro

## 2016-12-09 DIAGNOSIS — M542 Cervicalgia: Secondary | ICD-10-CM | POA: Diagnosis not present

## 2016-12-09 DIAGNOSIS — M791 Myalgia, unspecified site: Secondary | ICD-10-CM | POA: Diagnosis not present

## 2016-12-09 DIAGNOSIS — R51 Headache: Secondary | ICD-10-CM | POA: Diagnosis not present

## 2016-12-09 DIAGNOSIS — G43719 Chronic migraine without aura, intractable, without status migrainosus: Secondary | ICD-10-CM | POA: Diagnosis not present

## 2016-12-25 MED FILL — DICLOFENAC SOD 75 MG TAB EC: 75 | 90 days supply | Qty: 90 | Fill #1

## 2016-12-28 ENCOUNTER — Other Ambulatory Visit: Payer: Self-pay | Admitting: Family Medicine

## 2016-12-28 MED FILL — busPIRone HCL 7.5 MG TABS: 7.5 | 30 days supply | Qty: 90 | Fill #0

## 2016-12-28 MED FILL — BUPROPION HCL XL 150 MG TAB: 150 | 90 days supply | Qty: 90 | Fill #1

## 2017-01-05 ENCOUNTER — Ambulatory Visit (INDEPENDENT_AMBULATORY_CARE_PROVIDER_SITE_OTHER): Payer: 59 | Admitting: Family Medicine

## 2017-01-05 ENCOUNTER — Encounter: Payer: Self-pay | Admitting: Family Medicine

## 2017-01-05 VITALS — BP 138/84 | HR 83 | Temp 97.9°F | Resp 16 | Ht 62.0 in | Wt 168.8 lb

## 2017-01-05 DIAGNOSIS — E669 Obesity, unspecified: Secondary | ICD-10-CM

## 2017-01-05 DIAGNOSIS — F411 Generalized anxiety disorder: Secondary | ICD-10-CM

## 2017-01-05 DIAGNOSIS — I10 Essential (primary) hypertension: Secondary | ICD-10-CM | POA: Diagnosis not present

## 2017-01-05 DIAGNOSIS — F418 Other specified anxiety disorders: Secondary | ICD-10-CM | POA: Diagnosis not present

## 2017-01-05 DIAGNOSIS — F17200 Nicotine dependence, unspecified, uncomplicated: Secondary | ICD-10-CM

## 2017-01-05 DIAGNOSIS — G43111 Migraine with aura, intractable, with status migrainosus: Secondary | ICD-10-CM | POA: Diagnosis not present

## 2017-01-05 NOTE — Assessment & Plan Note (Signed)
Reports daily headaches despite botox and trigger point injections sees neurology alternate months, however I am recommending she discuss medication management wtih him

## 2017-01-05 NOTE — Patient Instructions (Signed)
Reschedule Feb appt as physical  We will  mail fasting lab order for end January , CBC, lipid , cmp , tSH, Vitamin D  Please contact Dr Olevia Perches office for your colonoscopy, overdue , I am re  Referring you  Please schedule your mammogram  Please discuss medication management of your headaches with Dr Domingo Cocking as you still have daily headaches  Current cigarettes are 1 PPD PLEASE work on cutting back and get all the help you can!

## 2017-01-11 ENCOUNTER — Encounter: Payer: Self-pay | Admitting: Family Medicine

## 2017-01-11 ENCOUNTER — Other Ambulatory Visit: Payer: Self-pay | Admitting: Family Medicine

## 2017-01-11 ENCOUNTER — Telehealth: Payer: Self-pay | Admitting: Family Medicine

## 2017-01-11 DIAGNOSIS — I10 Essential (primary) hypertension: Secondary | ICD-10-CM

## 2017-01-11 DIAGNOSIS — Z1211 Encounter for screening for malignant neoplasm of colon: Secondary | ICD-10-CM

## 2017-01-11 NOTE — Telephone Encounter (Signed)
Please order and mail these labs for patient to get 1 week before her next appointment fasting CBC, lipid, cmp, TSH and vitamin D, thanks, she was here last week, this is on  Her discharge sheet ,  She was an end of the day patient

## 2017-01-11 NOTE — Assessment & Plan Note (Signed)

## 2017-01-11 NOTE — Assessment & Plan Note (Signed)
Controlled, no change in medication  

## 2017-01-11 NOTE — Assessment & Plan Note (Signed)
Deteriorated. Patient re-educated about  the importance of commitment to a  minimum of 150 minutes of exercise per week.  The importance of healthy food choices with portion control discussed. Encouraged to start a food diary, count calories and to consider  joining a support group. Sample diet sheets offered. Goals set by the patient for the next several months.   Weight /BMI 01/05/2017 03/17/2016 08/27/2015  WEIGHT 168 lb 12 oz 169 lb 164 lb 0.6 oz  HEIGHT 5\' 2"  5\' 2"  5\' 2"   BMI 30.86 kg/m2 30.91 kg/m2 30 kg/m2

## 2017-01-11 NOTE — Progress Notes (Signed)
Zoe Bailey     MRN: [id number]      DOB: 02-Jul-1965   HPI Zoe Bailey is here for follow up and re-evaluation of chronic medical conditions, medication management and review of any available recent lab and radiology data.  Preventive health is updated, specifically  Cancer screening and Immunization.   Questions or concerns regarding consultations or procedures which the PT has had in the interim are  addressed. The PT denies any adverse reactions to current medications since the last visit.  There are no new concerns.  There are no specific complaints   ROS Denies recent fever or chills. Denies sinus pressure, nasal congestion, ear pain or sore throat. Denies chest congestion, productive cough or wheezing. Denies chest pains, palpitations and leg swelling Denies abdominal pain, nausea, vomiting,diarrhea or constipation.   Denies dysuria, frequency, hesitancy or incontinence. Denies joint pain, swelling and limitation in mobility. Denies headaches, seizures, numbness, or tingling. Denies depression, anxiety or insomnia. Denies skin break down or rash.   PE BP 138/84   Pulse 83   Temp 97.9 F (36.6 C) (Other (Comment))   Resp 16   Ht 5\' 2"  (1.575 m)   Wt 168 lb 12 oz (76.5 kg)   SpO2 98%   BMI 30.86 kg/m    Patient alert and oriented and in no cardiopulmonary distress.  HEENT: No facial asymmetry, EOMI,   oropharynx pink and moist.  Neck supple no JVD, no mass.  Chest: Clear to auscultation bilaterally.  CVS: S1, S2 no murmurs, no S3.Regular rate.  ABD: Soft non tender.   Ext: No edema  MS: Adequate ROM spine, shoulders, hips and knees.  Skin: Intact, no ulcerations or rash noted.  Psych: Good eye contact, normal affect. Memory intact not anxious or depressed appearing.  CNS: CN 2-12 intact, power,  normal throughout.no focal deficits noted.   Assessment & Plan  Migraine Reports daily headaches despite botox and trigger point injections sees neurology  alternate months, however I am recommending she discuss medication management wtih him  Essential hypertension Controlled, no change in medication DASH diet and commitment to daily physical activity for a minimum of 30 minutes discussed and encouraged, as a part of hypertension management. The importance of attaining a healthy weight is also discussed.  BP/Weight 01/05/2017 03/17/2016 08/27/2015 06/25/2015 02/28/2015 08/22/2014 10/01/3662  Systolic BP 403 474 259 563 875 643 329  Diastolic BP 84 92 78 64 92 82 82  Wt. (Lbs) 168.75 169 164.04 163 172 159.4 163  BMI 30.86 30.91 30 29.81 31.45 29.15 29.81       NICOTINE ADDICTION Patient is asked and  confirms current  Nicotine use.  Five to seven minutes of time is spent in counseling the patient of the need to quit smoking  Advice to quit is delivered clearly specifically in reducing the risk of developing heart disease, having a stroke, or of developing all types of cancer, especially lung and oral cancer. Improvement in breathing and exercise tolerance and quality of life is also discussed, as is the economic benefit.  Assessment of willingness to quit or to make an attempt to quit is made and documented  Assistance in quit attempt is made with several and varied options presented, based on patient's desire and need. These include  literature, local classes available, 1800 QUIT NOW number, OTC and prescription medication.  The GOAL to be NICOTINE FREE is re emphasized.  The patient has set a personal goal of either reduction or discontinuation and  follow up is arranged between 6 an 16 weeks.    Obesity (BMI 30.0-34.9) Deteriorated. Patient re-educated about  the importance of commitment to a  minimum of 150 minutes of exercise per week.  The importance of healthy food choices with portion control discussed. Encouraged to start a food diary, count calories and to consider  joining a support group. Sample diet sheets offered. Goals  set by the patient for the next several months.   Weight /BMI 01/05/2017 03/17/2016 08/27/2015  WEIGHT 168 lb 12 oz 169 lb 164 lb 0.6 oz  HEIGHT 5\' 2"  5\' 2"  5\' 2"   BMI 30.86 kg/m2 30.91 kg/m2 30 kg/m2      GAD (generalized anxiety disorder) Controlled, no change in medication   Depression with anxiety Controlled, no change in medication

## 2017-01-11 NOTE — Assessment & Plan Note (Signed)
Controlled, no change in medication DASH diet and commitment to daily physical activity for a minimum of 30 minutes discussed and encouraged, as a part of hypertension management. The importance of attaining a healthy weight is also discussed.  BP/Weight 01/05/2017 03/17/2016 08/27/2015 06/25/2015 02/28/2015 08/22/2014 2/99/2426  Systolic BP 834 196 222 979 892 119 417  Diastolic BP 84 92 78 64 92 82 82  Wt. (Lbs) 168.75 169 164.04 163 172 159.4 163  BMI 30.86 30.91 30 29.81 31.45 29.15 29.81

## 2017-01-12 ENCOUNTER — Encounter (INDEPENDENT_AMBULATORY_CARE_PROVIDER_SITE_OTHER): Payer: Self-pay | Admitting: *Deleted

## 2017-01-12 NOTE — Addendum Note (Signed)
Addended by: Merceda Elks on: 01/12/2017 08:57 AM   Modules accepted: Orders

## 2017-01-14 MED FILL — BOTOX 100 UNITS VIAL: 100 | 28 days supply | Qty: 2 | Fill #2

## 2017-01-14 MED FILL — GABAPENTIN 400 MG CAPSULE: 400 | 90 days supply | Qty: 270 | Fill #1

## 2017-01-14 MED FILL — CYCLOBENZAPRINE 10 MG TAB: 10 | 10 days supply | Qty: 30 | Fill #3

## 2017-01-20 DIAGNOSIS — G518 Other disorders of facial nerve: Secondary | ICD-10-CM | POA: Diagnosis not present

## 2017-01-20 DIAGNOSIS — M791 Myalgia, unspecified site: Secondary | ICD-10-CM | POA: Diagnosis not present

## 2017-01-20 DIAGNOSIS — M542 Cervicalgia: Secondary | ICD-10-CM | POA: Diagnosis not present

## 2017-01-20 DIAGNOSIS — G43719 Chronic migraine without aura, intractable, without status migrainosus: Secondary | ICD-10-CM | POA: Diagnosis not present

## 2017-02-01 ENCOUNTER — Other Ambulatory Visit: Payer: Self-pay | Admitting: Family Medicine

## 2017-02-01 MED FILL — TRIAMTERENE/HCTZ 75/50 TAB: 75-50 | 90 days supply | Qty: 90 | Fill #0

## 2017-02-01 NOTE — Telephone Encounter (Signed)
Seen 10 30 18

## 2017-03-18 DIAGNOSIS — M791 Myalgia, unspecified site: Secondary | ICD-10-CM | POA: Diagnosis not present

## 2017-03-18 DIAGNOSIS — R51 Headache: Secondary | ICD-10-CM | POA: Diagnosis not present

## 2017-03-18 DIAGNOSIS — M542 Cervicalgia: Secondary | ICD-10-CM | POA: Diagnosis not present

## 2017-03-18 DIAGNOSIS — G43719 Chronic migraine without aura, intractable, without status migrainosus: Secondary | ICD-10-CM | POA: Diagnosis not present

## 2017-03-25 ENCOUNTER — Other Ambulatory Visit: Payer: Self-pay | Admitting: Family Medicine

## 2017-03-25 MED FILL — DICLOFENAC SODIUM 75 MG TAB: 75 | 90 days supply | Qty: 90 | Fill #0

## 2017-03-25 NOTE — Telephone Encounter (Signed)
Seen 10 30 18

## 2017-03-28 DIAGNOSIS — H5712 Ocular pain, left eye: Secondary | ICD-10-CM | POA: Diagnosis not present

## 2017-03-30 DIAGNOSIS — H5310 Unspecified subjective visual disturbances: Secondary | ICD-10-CM | POA: Diagnosis not present

## 2017-04-12 ENCOUNTER — Ambulatory Visit: Payer: 59 | Admitting: Family Medicine

## 2017-04-12 ENCOUNTER — Other Ambulatory Visit: Payer: Self-pay | Admitting: Family Medicine

## 2017-04-12 ENCOUNTER — Other Ambulatory Visit (HOSPITAL_COMMUNITY)
Admission: RE | Admit: 2017-04-12 | Discharge: 2017-04-12 | Disposition: A | Discharge status: Home / Self Care | Payer: 59 | Source: Ambulatory Visit | Attending: Family Medicine | Admitting: Family Medicine

## 2017-04-12 ENCOUNTER — Encounter: Payer: Self-pay | Admitting: Family Medicine

## 2017-04-12 VITALS — BP 136/84 | HR 100 | Resp 16 | Ht 62.0 in | Wt 168.0 lb

## 2017-04-12 DIAGNOSIS — I10 Essential (primary) hypertension: Secondary | ICD-10-CM

## 2017-04-12 DIAGNOSIS — Z1231 Encounter for screening mammogram for malignant neoplasm of breast: Secondary | ICD-10-CM

## 2017-04-12 DIAGNOSIS — F17200 Nicotine dependence, unspecified, uncomplicated: Secondary | ICD-10-CM

## 2017-04-12 DIAGNOSIS — F418 Other specified anxiety disorders: Secondary | ICD-10-CM

## 2017-04-12 DIAGNOSIS — E669 Obesity, unspecified: Secondary | ICD-10-CM

## 2017-04-12 LAB — CBC
HEMATOCRIT: 42.5 % (ref 36.0–46.0)
HEMOGLOBIN: 14 g/dL (ref 12.0–15.0)
MCH: 31.1 pg (ref 26.0–34.0)
MCHC: 32.9 g/dL (ref 30.0–36.0)
MCV: 94.4 fL (ref 78.0–100.0)
Platelets: 241 10*3/uL (ref 150–400)
RBC: 4.5 MIL/uL (ref 3.87–5.11)
RDW: 13.2 % (ref 11.5–15.5)
WBC: 9.9 10*3/uL (ref 4.0–10.5)

## 2017-04-12 LAB — COMPREHENSIVE METABOLIC PANEL
ALT: 23 U/L (ref 14–54)
AST: 40 U/L (ref 15–41)
Albumin: 3.9 g/dL (ref 3.5–5.0)
Alkaline Phosphatase: 89 U/L (ref 38–126)
Anion gap: 10 (ref 5–15)
BUN: 20 mg/dL (ref 6–20)
CO2: 24 mmol/L (ref 22–32)
CREATININE: 0.76 mg/dL (ref 0.44–1.00)
Calcium: 9 mg/dL (ref 8.9–10.3)
Chloride: 101 mmol/L (ref 101–111)
Glucose, Bld: 111 mg/dL — ABNORMAL HIGH (ref 65–99)
POTASSIUM: 4 mmol/L (ref 3.5–5.1)
Sodium: 135 mmol/L (ref 135–145)
Total Bilirubin: 0.3 mg/dL (ref 0.3–1.2)
Total Protein: 7.3 g/dL (ref 6.5–8.1)

## 2017-04-12 LAB — LIPID PANEL
CHOL/HDL RATIO: 3 ratio
Cholesterol: 191 mg/dL (ref 0–200)
HDL: 63 mg/dL (ref >40)
LDL CALC: 102 mg/dL — AB (ref 0–99)
Triglycerides: 129 mg/dL (ref <150)
VLDL: 26 mg/dL (ref 0–40)

## 2017-04-12 LAB — TSH: TSH: 2.941 u[IU]/mL (ref 0.350–4.500)

## 2017-04-12 MED ORDER — NICOTINE 21 MG/24HR TD PT24: 21.0000 mg | MEDICATED_PATCH | Freq: Every day | TRANSDERMAL | 0 refills | Status: DC

## 2017-04-12 MED ORDER — BUPROPION HCL ER (XL) 300 MG PO TB24: 300.0000 mg | ORAL_TABLET | Freq: Every day | ORAL | 3 refills | Status: DC

## 2017-04-12 MED FILL — BUPROPION HCL XL 300 MG TAB: 300 | 90 days supply | Qty: 90 | Fill #0

## 2017-04-12 MED FILL — NICOTINE 21 MG/24HR PATCH: 21 | 28 days supply | Qty: 28 | Fill #0

## 2017-04-12 NOTE — Patient Instructions (Addendum)
Physical and Pap May 17 or after   Increase wellbutrin dose to 150 mg to TWO daily, new dose is 300 mg which is ent in, this will help with mood and smoking cessation  Labs today please, NON fasting same as what was ordered before  Asked and confirms 10 cigarettes per day  Advised of the need to QUIT , cutting down is insufficient. This will reduce your risk of needing to depend on oxygen in the future, develop any type of cancer , and heart disease and srtroke.  Assess: willingness to quit : yes READY NOW. 1800 QUIT NOW, 24/7  Free support, look at smoking cessation classes through [facility]  imnsurance  [facility] patch [facility], 3 week of 21,  3 weeks of 14 , then 3 weeks of 7, n SMOKE with patch , so quit date for smoking is day one of starting the patch, and quit date for nicotine is in 9 weeks    Steps to Quit Smoking Smoking tobacco can be bad for your health. It can also affect almost every organ in your body. Smoking puts you and people around you at risk for many serious long-lasting (chronic) diseases. Quitting smoking is hard, but it is one of the best things that you can do for your health. It is never too late to quit. What are the benefits of quitting smoking? When you quit smoking, you lower your risk for getting serious diseases and conditions. They can include:  Lung cancer or lung disease.  Heart disease.  Stroke.  Heart attack.  Not being able to have children (infertility).  Weak bones (osteoporosis) and broken bones (fractures).  If you have coughing, wheezing, and shortness of breath, those symptoms may get better when you quit. You may also get sick less often. If you are pregnant, quitting smoking can help to lower your chances of having a baby of low birth weight. What can I do to help me quit smoking? Talk with your doctor about what can help you quit smoking. Some things you can do (strategies) include:  Quitting smoking totally, instead of  slowly cutting back how much you smoke over a period of time.  Going to in-person counseling. You are more likely to quit if you go to many counseling sessions.  Using resources and support systems, such as: ? Database administrator with a Social worker. ? Phone quitlines. ? Careers information officer. ? Support groups or group counseling. ? Text messaging programs. ? Mobile phone apps or applications.  Taking medicines. Some of these medicines may have nicotine in them. If you are pregnant or breastfeeding, do not take any medicines to quit smoking unless your doctor says it is okay. Talk with your doctor about counseling or other things that can help you.  Talk with your doctor about using more than one strategy at the same time, such as taking medicines while you are also going to in-person counseling. This can help make quitting easier. What things can I do to make it easier to quit? Quitting smoking might feel very hard at first, but there is a lot that you can do to make it easier. Take these steps:  Talk to your family and friends. Ask them to support and encourage you.  Call phone quitlines, reach out to support groups, or work with a Social worker.  Ask people who smoke to not smoke around you.  Avoid places that make you want (trigger) to smoke, such as: ? Bars. ? Parties. ?  Smoke-break areas at work.  Spend time with people who do not smoke.  Lower the stress in your life. Stress can make you want to smoke. Try these things to help your stress: ? Getting regular exercise. ? Deep-breathing exercises. ? Yoga. ? Meditating. ? Doing a body scan. To do this, close your eyes, focus on one area of your body at a time from head to toe, and notice which parts of your body are tense. Try to relax the muscles in those areas.  Download or buy apps on your mobile phone or tablet that can help you stick to your quit plan. There are many free apps, such as QuitGuide from the [employer] Office manager for Disease  Control and Prevention). You can find more support from smokefree.gov and other websites.  This information is not intended to replace advice given to you by your health care provider. Make sure you discuss any questions you have with your health care provider. Document Released: 12/20/2008 Document Revised: 10/22/2015 Document Reviewed: 07/10/2014 Elsevier Interactive Patient Education  2018 [employer].

## 2017-04-13 ENCOUNTER — Telehealth: Payer: Self-pay

## 2017-04-13 DIAGNOSIS — R7301 Impaired fasting glucose: Secondary | ICD-10-CM

## 2017-04-13 LAB — VITAMIN D 25 HYDROXY (VIT D DEFICIENCY, FRACTURES): Vit D, 25-Hydroxy: 29.5 ng/mL — ABNORMAL LOW (ref 30.0–100.0)

## 2017-04-13 LAB — HEMOGLOBIN A1C
Hgb A1c MFr Bld: 5.9 % — ABNORMAL HIGH (ref 4.8–5.6)
Mean Plasma Glucose: 122.63 mg/dL

## 2017-04-13 NOTE — Telephone Encounter (Signed)
Requested [facility] lab to add on

## 2017-04-14 ENCOUNTER — Encounter (HOSPITAL_COMMUNITY): Payer: Self-pay

## 2017-04-14 ENCOUNTER — Ambulatory Visit (HOSPITAL_COMMUNITY)
Admission: RE | Admit: 2017-04-14 | Discharge: 2017-04-14 | Disposition: A | Discharge status: Home / Self Care | Payer: 59 | Source: Ambulatory Visit | Attending: Family Medicine | Admitting: Family Medicine

## 2017-04-14 ENCOUNTER — Telehealth: Payer: Self-pay | Admitting: Family Medicine

## 2017-04-14 DIAGNOSIS — Z1231 Encounter for screening mammogram for malignant neoplasm of breast: Secondary | ICD-10-CM | POA: Diagnosis not present

## 2017-04-14 NOTE — Telephone Encounter (Signed)
Pt called left VM that she was returning a call

## 2017-04-15 NOTE — Progress Notes (Signed)
Lynnix is aware

## 2017-04-15 NOTE — Telephone Encounter (Signed)
Called pt, aware of lab results.

## 2017-04-18 ENCOUNTER — Encounter: Payer: Self-pay | Admitting: Family Medicine

## 2017-04-18 MED ORDER — NICOTINE 14 MG/24HR TD PT24: 14.0000 mg | MEDICATED_PATCH | Freq: Every day | TRANSDERMAL | 0 refills | Status: DC

## 2017-04-18 MED ORDER — NICOTINE 7 MG/24HR TD PT24: 7.0000 mg | MEDICATED_PATCH | Freq: Every day | TRANSDERMAL | 0 refills | Status: DC

## 2017-04-18 NOTE — Progress Notes (Signed)
Zoe Bailey     MRN: [id number]      DOB: 1965-05-09   HPI Zoe Bailey is here for follow up and re-evaluation of chronic medical conditions, medication management and review of any available recent lab and radiology data.  Preventive health is updated, specifically  Cancer screening and Immunization.  Wants colonoscopy and is asking that I specifically ask for this, her mother has colon polyps The PT denies any adverse reactions to current medications since the last visit.  Wants to stop smoking  ROS Denies recent fever or chills. Denies sinus pressure, nasal congestion, ear pain or sore throat. Denies chest congestion, productive cough or wheezing. Denies chest pains, palpitations and leg swelling Denies abdominal pain, nausea, vomiting,diarrhea or constipation.   Denies dysuria, frequency, hesitancy or incontinence. Denies joint pain, swelling and limitation in mobility. Denies headaches, seizures, numbness, or tingling. Denies depression, anxiety or insomnia. Denies skin break down or rash.   PE  BP 136/84   Pulse 100   Resp 16   Ht 5\' 2"  (1.575 m)   Wt 168 lb (76.2 kg)   LMP 04/09/2017   SpO2 98%   BMI 30.73 kg/m   Patient alert and oriented and in no cardiopulmonary distress.  HEENT: No facial asymmetry, EOMI,   oropharynx pink and moist.  Neck supple no JVD, no mass.  Chest: Clear to auscultation bilaterally.  CVS: S1, S2 no murmurs, no S3.Regular rate.  ABD: Soft non tender.   Ext: No edema  MS: Adequate ROM spine, shoulders, hips and knees.  Skin: Intact, no ulcerations or rash noted.  Psych: Good eye contact, normal affect. Memory intact not anxious or depressed appearing.  CNS: CN 2-12 intact, power,  normal throughout.no focal deficits noted.   Assessment & Plan  Essential hypertension Controlled, no change in medication DASH diet and commitment to daily physical activity for a minimum of 30 minutes discussed and encouraged, as a part of  hypertension management. The importance of attaining a healthy weight is also discussed.  BP/Weight 04/12/2017 01/05/2017 03/17/2016 08/27/2015 06/25/2015 02/28/2015 1/44/8185  Systolic BP 631 497 026 378 588 502 774  Diastolic BP 84 84 92 78 64 92 82  Wt. (Lbs) 168 168.75 169 164.04 163 172 159.4  BMI 30.73 30.86 30.91 30 29.81 31.45 29.15       NICOTINE ADDICTION Asked : confirms current use of 10 to 15/day Advised : of need to quit to reduce risk of all cancers, heart disease and stroke, also COPD Assess: ready to quit Assist: start patch a script sent , aware of need to use patch in place of cigarette, given 1800 QUIT NOW # also recommended involvement with any support Arrange:f/u in 6 to 12 weeks Time spent in  counseling 5 minutes, applauded on decision to quit  Obesity (BMI 30.0-34.9) Unchanged. Patient re-educated about  the importance of commitment to a  minimum of 150 minutes of exercise per week.  The importance of healthy food choices with portion control discussed. Encouraged to start a food diary, count calories and to consider  joining a support group. Sample diet sheets offered. Goals set by the patient for the next several months.   Weight /BMI 04/12/2017 01/05/2017 03/17/2016  WEIGHT 168 lb 168 lb 12 oz 169 lb  HEIGHT 5\' 2"  5\' 2"  5\' 2"   BMI 30.73 kg/m2 30.86 kg/m2 30.91 kg/m2      Depression with anxiety Increase Wellbutrin dose to help with cigarette craving as well as mood Currently  not suicidal or homicidal, does report increased anxiety

## 2017-04-18 NOTE — Assessment & Plan Note (Addendum)
Asked : confirms current use of 10 to 15/day Advised : of need to quit to reduce risk of all cancers, heart disease and stroke, also COPD Assess: ready to quit Assist: start patch a script sent , aware of need to use patch in place of cigarette, given 1800 QUIT NOW # also recommended involvement with any support Arrange:f/u in 6 to 12 weeks Time spent in  counseling 5 minutes, applauded on decision to quit

## 2017-04-18 NOTE — Assessment & Plan Note (Signed)
Unchanged. Patient re-educated about  the importance of commitment to a  minimum of 150 minutes of exercise per week.  The importance of healthy food choices with portion control discussed. Encouraged to start a food diary, count calories and to consider  joining a support group. Sample diet sheets offered. Goals set by the patient for the next several months.   Weight /BMI 04/12/2017 01/05/2017 03/17/2016  WEIGHT 168 lb 168 lb 12 oz 169 lb  HEIGHT 5\' 2"  5\' 2"  5\' 2"   BMI 30.73 kg/m2 30.86 kg/m2 30.91 kg/m2

## 2017-04-18 NOTE — Assessment & Plan Note (Signed)
Controlled, no change in medication DASH diet and commitment to daily physical activity for a minimum of 30 minutes discussed and encouraged, as a part of hypertension management. The importance of attaining a healthy weight is also discussed.  BP/Weight 04/12/2017 01/05/2017 03/17/2016 08/27/2015 06/25/2015 02/28/2015 2/92/4462  Systolic BP 863 817 711 657 903 833 383  Diastolic BP 84 84 92 78 64 92 82  Wt. (Lbs) 168 168.75 169 164.04 163 172 159.4  BMI 30.73 30.86 30.91 30 29.81 31.45 29.15

## 2017-04-18 NOTE — Assessment & Plan Note (Signed)
Increase Wellbutrin dose to help with cigarette craving as well as mood Currently not suicidal or homicidal, does report increased anxiety

## 2017-04-19 ENCOUNTER — Other Ambulatory Visit (INDEPENDENT_AMBULATORY_CARE_PROVIDER_SITE_OTHER): Payer: Self-pay | Admitting: *Deleted

## 2017-04-19 DIAGNOSIS — Z1211 Encounter for screening for malignant neoplasm of colon: Secondary | ICD-10-CM

## 2017-04-19 MED FILL — BOTOX 100 UNITS VIAL: 100 | 28 days supply | Qty: 2 | Fill #3

## 2017-04-20 ENCOUNTER — Other Ambulatory Visit (INDEPENDENT_AMBULATORY_CARE_PROVIDER_SITE_OTHER): Payer: Self-pay | Admitting: *Deleted

## 2017-04-20 DIAGNOSIS — Z01818 Encounter for other preprocedural examination: Secondary | ICD-10-CM | POA: Insufficient documentation

## 2017-04-20 DIAGNOSIS — Z1211 Encounter for screening for malignant neoplasm of colon: Secondary | ICD-10-CM

## 2017-04-21 DIAGNOSIS — G43719 Chronic migraine without aura, intractable, without status migrainosus: Secondary | ICD-10-CM | POA: Diagnosis not present

## 2017-04-21 DIAGNOSIS — M791 Myalgia, unspecified site: Secondary | ICD-10-CM | POA: Diagnosis not present

## 2017-04-21 DIAGNOSIS — G518 Other disorders of facial nerve: Secondary | ICD-10-CM | POA: Diagnosis not present

## 2017-04-21 DIAGNOSIS — M542 Cervicalgia: Secondary | ICD-10-CM | POA: Diagnosis not present

## 2017-04-22 ENCOUNTER — Telehealth (INDEPENDENT_AMBULATORY_CARE_PROVIDER_SITE_OTHER): Payer: Self-pay | Admitting: Internal Medicine

## 2017-04-22 ENCOUNTER — Encounter (INDEPENDENT_AMBULATORY_CARE_PROVIDER_SITE_OTHER): Payer: Self-pay | Admitting: *Deleted

## 2017-04-22 DIAGNOSIS — H35363 Drusen (degenerative) of macula, bilateral: Secondary | ICD-10-CM | POA: Diagnosis not present

## 2017-04-22 DIAGNOSIS — H5213 Myopia, bilateral: Secondary | ICD-10-CM | POA: Diagnosis not present

## 2017-04-23 ENCOUNTER — Telehealth (INDEPENDENT_AMBULATORY_CARE_PROVIDER_SITE_OTHER): Payer: Self-pay | Admitting: *Deleted

## 2017-04-23 MED ORDER — PEG 3350-KCL-NA BICARB-NACL 420 G PO SOLR: 4000.0000 mL | Freq: Once | ORAL | 0 refills | Status: AC

## 2017-04-23 MED FILL — GAVILYTE-G SOLUTION: 236 | 1 days supply | Qty: 4000 | Fill #0

## 2017-04-23 NOTE — Telephone Encounter (Signed)
Referring MD/PCP: simpson   Procedure: tcs w propofol  Reason/Indication:  screening  Has patient had this procedure before?  no  If so, when, by whom and where?    Is there a family history of colon cancer?  no  Who?  What age when diagnosed?    Is patient diabetic?   no      Does patient have prosthetic heart valve or mechanical valve?  no  Do you have a pacemaker?  no  Has patient ever had endocarditis? no  Has patient had joint replacement within last 12 months?  no  Is patient constipated or do they take laxatives? no  Does patient have a history of alcohol/drug use?  no  Is patient on blood thinner such as Coumadin, Plavix and/or Aspirin? no  Medications: see epic  Allergies: see epic  Medication Adjustment per Dr Lindi Adie, NP:   Procedure date & time: 05/07/17 at 830

## 2017-04-23 NOTE — Telephone Encounter (Signed)
agree

## 2017-04-23 NOTE — Telephone Encounter (Signed)
Patient needs trilyte 

## 2017-04-23 NOTE — Telephone Encounter (Signed)
noted 

## 2017-04-29 NOTE — Patient Instructions (Signed)
Zoe Bailey  04/29/2017     @PREFPERIOPPHARMACY @   Your procedure is scheduled on  05/07/2017   Report to [facility] at  640   A.M.  Call this number if you have problems the morning of surgery:  [phone]   Remember:  Do not eat food or drink liquids after midnight.  Take these medicines the morning of surgery with A SIP OF WATER  Wellbutrin, flexaril, voltaren, gabapentin, maxzide. Use your inhaler before you come.   Do not wear jewelry, make-up or nail polish.  Do not wear lotions, powders, or perfumes, or deodorant.  Do not shave 48 hours prior to surgery.  Men may shave face and neck.  Do not bring valuables to the hospital.  [facility] is not responsible for any belongings or valuables.  Contacts, dentures or bridgework may not be worn into surgery.  Leave your suitcase in the car.  After surgery it may be brought to your room.  For patients admitted to the hospital, discharge time will be determined by your treatment team.  Patients discharged the day of surgery will not be allowed to drive home.   Name and phone number of your driver:   family Special instructions:  Follow the diet and prep instructions given to you by Dr Olevia Perches office.  Please read over the following fact sheets that you were given. Anesthesia Post-op Instructions and Care and Recovery After Surgery       Colonoscopy, Adult A colonoscopy is an exam to look at the large intestine. It is done to check for problems, such as:  Lumps (tumors).  Growths (polyps).  Swelling (inflammation).  Bleeding.  What happens before the procedure? Eating and drinking Follow instructions from your doctor about eating and drinking. These instructions may include:  A few days before the procedure - follow a low-fiber diet. ? Avoid nuts. ? Avoid seeds. ? Avoid dried fruit. ? Avoid raw fruits. ? Avoid vegetables.  1-3 days before the procedure - follow a clear liquid diet. Avoid  liquids that have red or purple dye. Drink only clear liquids, such as: ? Clear broth or bouillon. ? Black coffee or tea. ? Clear juice. ? Clear soft drinks or sports drinks. ? Gelatin dessert. ? Popsicles.  On the day of the procedure - do not eat or drink anything during the 2 hours before the procedure.  Bowel prep If you were prescribed an oral bowel prep:  Take it as told by your doctor. Starting the day before your procedure, you will need to drink a lot of liquid. The liquid will cause you to poop (have bowel movements) until your poop is almost clear or light green.  If your skin or butt gets irritated from diarrhea, you may: ? Wipe the area with wipes that have medicine in them, such as adult wet wipes with aloe and vitamin E. ? Put something on your skin that soothes the area, such as petroleum jelly.  If you throw up (vomit) while drinking the bowel prep, take a break for up to 60 minutes. Then begin the bowel prep again. If you keep throwing up and you cannot take the bowel prep without throwing up, call your doctor.  General instructions  Ask your doctor about changing or stopping your normal medicines. This is important if you take diabetes medicines or blood thinners.  Plan to have someone take you home from the hospital or clinic. What  happens during the procedure?  An IV tube may be put into one of your veins.  You will be given medicine to help you relax (sedative).  To reduce your risk of infection: ? Your doctors will wash their hands. ? Your anal area will be washed with soap.  You will be asked to lie on your side with your knees bent.  Your doctor will get a long, thin, flexible tube ready. The tube will have a camera and a light on the end.  The tube will be put into your anus.  The tube will be gently put into your large intestine.  Air will be delivered into your large intestine to keep it open. You may feel some pressure or cramping.  The  camera will be used to take photos.  A small tissue sample may be removed from your body to be looked at under a microscope (biopsy). If any possible problems are found, the tissue will be sent to a lab for testing.  If small growths are found, your doctor may remove them and have them checked for cancer.  The tube that was put into your anus will be slowly removed. The procedure may vary among doctors and hospitals. What happens after the procedure?  Your doctor will check on you often until the medicines you were given have worn off.  Do not drive for 24 hours after the procedure.  You may have a small amount of blood in your poop.  You may pass gas.  You may have mild cramps or bloating in your belly (abdomen).  It is up to you to get the results of your procedure. Ask your doctor, or the department performing the procedure, when your results will be ready. This information is not intended to replace advice given to you by your health care provider. Make sure you discuss any questions you have with your health care provider. Document Released: 03/28/2010 Document Revised: 12/25/2015 Document Reviewed: 05/07/2015 Elsevier Interactive Patient Education  2017 [employer]  Colonoscopy, Adult, Care After This sheet gives you information about how to care for yourself after your procedure. Your health care provider may also give you more specific instructions. If you have problems or questions, contact your health care provider. What can I expect after the procedure? After the procedure, it is common to have:  A small amount of blood in your stool for 24 hours after the procedure.  Some gas.  Mild abdominal cramping or bloating.  Follow these instructions at home: General instructions   For the first 24 hours after the procedure: ? Do not drive or use machinery. ? Do not sign important documents. ? Do not drink alcohol. ? Do your regular daily activities at a slower pace  than normal. ? Eat soft, easy-to-digest foods. ? Rest often.  Take over-the-counter or prescription medicines only as told by your health care provider.  It is up to you to get the results of your procedure. Ask your health care provider, or the department performing the procedure, when your results will be ready. Relieving cramping and bloating  Try walking around when you have cramps or feel bloated.  Apply heat to your abdomen as told by your health care provider. Use a heat source that your health care provider recommends, such as a moist heat pack or a heating pad. ? Place a towel between your skin and the heat source. ? Leave the heat on for 20-30 minutes. ? Remove the heat if your skin  turns bright red. This is especially important if you are unable to feel pain, heat, or cold. You may have a greater risk of getting burned. Eating and drinking  Drink enough fluid to keep your urine clear or pale yellow.  Resume your normal diet as instructed by your health care provider. Avoid heavy or fried foods that are hard to digest.  Avoid drinking alcohol for as long as instructed by your health care provider. Contact a health care provider if:  You have blood in your stool 2-3 days after the procedure. Get help right away if:  You have more than a small spotting of blood in your stool.  You pass large blood clots in your stool.  Your abdomen is swollen.  You have nausea or vomiting.  You have a fever.  You have increasing abdominal pain that is not relieved with medicine. This information is not intended to replace advice given to you by your health care provider. Make sure you discuss any questions you have with your health care provider. Document Released: 10/08/2003 Document Revised: 11/18/2015 Document Reviewed: 05/07/2015 Elsevier Interactive Patient Education  2018 [place] Anesthesia is a term that refers to techniques, procedures, and  medicines that help a person stay safe and comfortable during a medical procedure. Monitored anesthesia care, or sedation, is one type of anesthesia. Your anesthesia specialist may recommend sedation if you will be having a procedure that does not require you to be unconscious, such as:  Cataract surgery.  A dental procedure.  A biopsy.  A colonoscopy.  During the procedure, you may receive a medicine to help you relax (sedative). There are three levels of sedation:  Mild sedation. At this level, you may feel awake and relaxed. You will be able to follow directions.  Moderate sedation. At this level, you will be sleepy. You may not remember the procedure.  Deep sedation. At this level, you will be asleep. You will not remember the procedure.  The more medicine you are given, the deeper your level of sedation will be. Depending on how you respond to the procedure, the anesthesia specialist may change your level of sedation or the type of anesthesia to fit your needs. An anesthesia specialist will monitor you closely during the procedure. Let your health care provider know about:  Any allergies you have.  All medicines you are taking, including vitamins, herbs, eye drops, creams, and over-the-counter medicines.  Any use of steroids (by mouth or as a cream).  Any problems you or family members have had with sedatives and anesthetic medicines.  Any blood disorders you have.  Any surgeries you have had.  Any medical conditions you have, such as sleep apnea.  Whether you are pregnant or may be pregnant.  Any use of cigarettes, alcohol, or street drugs. What are the risks? Generally, this is a safe procedure. However, problems may occur, including:  Getting too much medicine (oversedation).  Nausea.  Allergic reaction to medicines.  Trouble breathing. If this happens, a breathing tube may be used to help with breathing. It will be removed when you are awake and breathing on  your own.  Heart trouble.  Lung trouble.  Before the procedure Staying hydrated Follow instructions from your health care provider about hydration, which may include:  Up to 2 hours before the procedure - you may continue to drink clear liquids, such as water, clear fruit juice, black coffee, and plain tea.  Eating and drinking restrictions Follow instructions  from your health care provider about eating and drinking, which may include:  8 hours before the procedure - stop eating heavy meals or foods such as meat, fried foods, or fatty foods.  6 hours before the procedure - stop eating light meals or foods, such as toast or cereal.  6 hours before the procedure - stop drinking milk or drinks that contain milk.  2 hours before the procedure - stop drinking clear liquids.  Medicines Ask your health care provider about:  Changing or stopping your regular medicines. This is especially important if you are taking diabetes medicines or blood thinners.  Taking medicines such as aspirin and ibuprofen. These medicines can thin your blood. Do not take these medicines before your procedure if your health care provider instructs you not to.  Tests and exams  You will have a physical exam.  You may have blood tests done to show: ? How well your kidneys and liver are working. ? How well your blood can clot.  General instructions  Plan to have someone take you home from the hospital or clinic.  If you will be going home right after the procedure, plan to have someone with you for 24 hours.  What happens during the procedure?  Your blood pressure, heart rate, breathing, level of pain and overall condition will be monitored.  An IV tube will be inserted into one of your veins.  Your anesthesia specialist will give you medicines as needed to keep you comfortable during the procedure. This may mean changing the level of sedation.  The procedure will be performed. After the  procedure  Your blood pressure, heart rate, breathing rate, and blood oxygen level will be monitored until the medicines you were given have worn off.  Do not drive for 24 hours if you received a sedative.  You may: ? Feel sleepy, clumsy, or nauseous. ? Feel forgetful about what happened after the procedure. ? Have a sore throat if you had a breathing tube during the procedure. ? Vomit. This information is not intended to replace advice given to you by your health care provider. Make sure you discuss any questions you have with your health care provider. Document Released: 11/19/2004 Document Revised: 08/02/2015 Document Reviewed: 06/16/2015 Elsevier Interactive Patient Education  2018 [place], Care After These instructions provide you with information about caring for yourself after your procedure. Your health care provider may also give you more specific instructions. Your treatment has been planned according to current medical practices, but problems sometimes occur. Call your health care provider if you have any problems or questions after your procedure. What can I expect after the procedure? After your procedure, it is common to:  Feel sleepy for several hours.  Feel clumsy and have poor balance for several hours.  Feel forgetful about what happened after the procedure.  Have poor judgment for several hours.  Feel nauseous or vomit.  Have a sore throat if you had a breathing tube during the procedure.  Follow these instructions at home: For at least 24 hours after the procedure:   Do not: ? Participate in activities in which you could fall or become injured. ? Drive. ? Use heavy machinery. ? Drink alcohol. ? Take sleeping pills or medicines that cause drowsiness. ? Make important decisions or sign legal documents. ? Take care of children on your own.  Rest. Eating and drinking  Follow the diet that is recommended by your health  care provider.  If  you vomit, drink water, juice, or soup when you can drink without vomiting.  Make sure you have little or no nausea before eating solid foods. General instructions  Have a responsible adult stay with you until you are awake and alert.  Take over-the-counter and prescription medicines only as told by your health care provider.  If you smoke, do not smoke without supervision.  Keep all follow-up visits as told by your health care provider. This is important. Contact a health care provider if:  You keep feeling nauseous or you keep vomiting.  You feel light-headed.  You develop a rash.  You have a fever. Get help right away if:  You have trouble breathing. This information is not intended to replace advice given to you by your health care provider. Make sure you discuss any questions you have with your health care provider. Document Released: 06/16/2015 Document Revised: 10/16/2015 Document Reviewed: 06/16/2015 Elsevier Interactive Patient Education  [employer].

## 2017-05-03 ENCOUNTER — Telehealth (INDEPENDENT_AMBULATORY_CARE_PROVIDER_SITE_OTHER): Payer: Self-pay | Admitting: *Deleted

## 2017-05-03 ENCOUNTER — Other Ambulatory Visit: Payer: Self-pay

## 2017-05-03 ENCOUNTER — Encounter (HOSPITAL_COMMUNITY): Payer: Self-pay

## 2017-05-03 ENCOUNTER — Encounter (HOSPITAL_COMMUNITY)
Admission: RE | Admit: 2017-05-03 | Discharge: 2017-05-03 | Disposition: A | Discharge status: Home / Self Care | Payer: 59 | Source: Ambulatory Visit | Attending: Internal Medicine | Admitting: Internal Medicine

## 2017-05-03 DIAGNOSIS — Z01818 Encounter for other preprocedural examination: Secondary | ICD-10-CM | POA: Diagnosis not present

## 2017-05-03 DIAGNOSIS — Z1211 Encounter for screening for malignant neoplasm of colon: Secondary | ICD-10-CM

## 2017-05-03 HISTORY — DX: Unspecified osteoarthritis, unspecified site: M19.90

## 2017-05-03 HISTORY — DX: Chronic obstructive pulmonary disease, unspecified: J44.9

## 2017-05-03 LAB — PREGNANCY, URINE: PREG TEST UR: NEGATIVE

## 2017-05-03 NOTE — Pre-Procedure Instructions (Signed)
Patient in for PAT and states she has someone to drive her home but does not have anyone that can stay with her or that she can stay with. Contacted Dr Olevia Perches office and he was out of the office. Doris told of above information and will let Dr Laural Golden know when he arrives and call me back tomorrow.

## 2017-05-03 NOTE — Telephone Encounter (Signed)
Kim from day surgery called -- Ms Westerhold told them she does not have some to sit with her for the 24 hrs time period following her TCS this Friday -- she does have someone to drive her home -- Maudie Mercury wanted you to know to see if you are ok with Ms Draughon not having someone to sit with her for 24 hrs after TCS

## 2017-05-03 NOTE — Telephone Encounter (Signed)
I am afraid we have to go with the hospital policy.

## 2017-05-07 ENCOUNTER — Encounter (HOSPITAL_COMMUNITY)
Admission: RE | Disposition: A | Discharge status: Home / Self Care | Payer: Self-pay | Source: Ambulatory Visit | Attending: Internal Medicine

## 2017-05-07 ENCOUNTER — Ambulatory Visit (HOSPITAL_COMMUNITY)
Admission: RE | Admit: 2017-05-07 | Discharge: 2017-05-07 | Disposition: A | Discharge status: Home / Self Care | Payer: 59 | Source: Ambulatory Visit | Attending: Internal Medicine | Admitting: Internal Medicine

## 2017-05-07 ENCOUNTER — Encounter (HOSPITAL_COMMUNITY): Payer: Self-pay | Admitting: Anesthesiology

## 2017-05-07 ENCOUNTER — Ambulatory Visit (HOSPITAL_COMMUNITY): Payer: 59 | Admitting: Anesthesiology

## 2017-05-07 DIAGNOSIS — Z79899 Other long term (current) drug therapy: Secondary | ICD-10-CM | POA: Insufficient documentation

## 2017-05-07 DIAGNOSIS — F17210 Nicotine dependence, cigarettes, uncomplicated: Secondary | ICD-10-CM | POA: Insufficient documentation

## 2017-05-07 DIAGNOSIS — Z791 Long term (current) use of non-steroidal anti-inflammatories (NSAID): Secondary | ICD-10-CM | POA: Insufficient documentation

## 2017-05-07 DIAGNOSIS — Z888 Allergy status to other drugs, medicaments and biological substances status: Secondary | ICD-10-CM | POA: Diagnosis not present

## 2017-05-07 DIAGNOSIS — Z8371 Family history of colonic polyps: Secondary | ICD-10-CM | POA: Insufficient documentation

## 2017-05-07 DIAGNOSIS — K644 Residual hemorrhoidal skin tags: Secondary | ICD-10-CM | POA: Diagnosis not present

## 2017-05-07 DIAGNOSIS — G4730 Sleep apnea, unspecified: Secondary | ICD-10-CM | POA: Insufficient documentation

## 2017-05-07 DIAGNOSIS — K5730 Diverticulosis of large intestine without perforation or abscess without bleeding: Secondary | ICD-10-CM | POA: Insufficient documentation

## 2017-05-07 DIAGNOSIS — Z1211 Encounter for screening for malignant neoplasm of colon: Secondary | ICD-10-CM | POA: Diagnosis not present

## 2017-05-07 DIAGNOSIS — Z01818 Encounter for other preprocedural examination: Secondary | ICD-10-CM | POA: Insufficient documentation

## 2017-05-07 DIAGNOSIS — F329 Major depressive disorder, single episode, unspecified: Secondary | ICD-10-CM | POA: Diagnosis not present

## 2017-05-07 DIAGNOSIS — Z7982 Long term (current) use of aspirin: Secondary | ICD-10-CM | POA: Insufficient documentation

## 2017-05-07 DIAGNOSIS — F419 Anxiety disorder, unspecified: Secondary | ICD-10-CM | POA: Diagnosis not present

## 2017-05-07 DIAGNOSIS — Z85828 Personal history of other malignant neoplasm of skin: Secondary | ICD-10-CM | POA: Diagnosis not present

## 2017-05-07 DIAGNOSIS — J449 Chronic obstructive pulmonary disease, unspecified: Secondary | ICD-10-CM | POA: Diagnosis not present

## 2017-05-07 HISTORY — PX: COLONOSCOPY WITH PROPOFOL: SHX5780

## 2017-05-07 LAB — GLUCOSE, CAPILLARY: Glucose-Capillary: 90 mg/dL (ref 65–99)

## 2017-05-07 SURGERY — COLONOSCOPY WITH PROPOFOL
Anesthesia: Monitor Anesthesia Care

## 2017-05-07 MED ORDER — PROPOFOL 10 MG/ML IV BOLUS
INTRAVENOUS | Status: AC
  Filled 2017-05-07: qty 40

## 2017-05-07 MED ORDER — LACTATED RINGERS IV SOLN
INTRAVENOUS | Status: DC
  Administered 2017-05-07: 08:00:00 via INTRAVENOUS

## 2017-05-07 MED ORDER — STERILE WATER FOR IRRIGATION IR SOLN
Status: DC | PRN
  Administered 2017-05-07: 100 mL

## 2017-05-07 MED ORDER — MIDAZOLAM HCL 2 MG/2ML IJ SOLN
1.0000 mg | INTRAMUSCULAR | Status: AC
  Administered 2017-05-07: 2 mg via INTRAVENOUS

## 2017-05-07 MED ORDER — PROPOFOL 10 MG/ML IV BOLUS
INTRAVENOUS | Status: AC
  Filled 2017-05-07: qty 20

## 2017-05-07 MED ORDER — CHLORHEXIDINE GLUCONATE CLOTH 2 % EX PADS: 6.0000 | MEDICATED_PAD | Freq: Once | CUTANEOUS | Status: DC

## 2017-05-07 MED ORDER — PROPOFOL 500 MG/50ML IV EMUL
INTRAVENOUS | Status: DC | PRN
  Administered 2017-05-07: 09:00:00 via INTRAVENOUS
  Administered 2017-05-07: 150 ug/kg/min via INTRAVENOUS

## 2017-05-07 MED ORDER — FENTANYL CITRATE (PF) 100 MCG/2ML IJ SOLN
25.0000 ug | Freq: Once | INTRAMUSCULAR | Status: AC
  Administered 2017-05-07: 25 ug via INTRAVENOUS

## 2017-05-07 MED ORDER — IPRATROPIUM-ALBUTEROL 0.5-2.5 (3) MG/3ML IN SOLN
RESPIRATORY_TRACT | Status: AC
  Filled 2017-05-07: qty 3

## 2017-05-07 MED ORDER — LIDOCAINE HCL (PF) 1 % IJ SOLN
INTRAMUSCULAR | Status: AC
  Filled 2017-05-07: qty 5

## 2017-05-07 MED ORDER — FENTANYL CITRATE (PF) 100 MCG/2ML IJ SOLN
INTRAMUSCULAR | Status: AC
  Filled 2017-05-07: qty 2

## 2017-05-07 MED ORDER — MIDAZOLAM HCL 2 MG/2ML IJ SOLN
INTRAMUSCULAR | Status: AC
  Filled 2017-05-07: qty 2

## 2017-05-07 MED ORDER — IPRATROPIUM-ALBUTEROL 0.5-2.5 (3) MG/3ML IN SOLN
3.0000 mL | Freq: Four times a day (QID) | RESPIRATORY_TRACT | Status: DC
  Administered 2017-05-07: 3 mL via RESPIRATORY_TRACT

## 2017-05-07 NOTE — Discharge Instructions (Signed)
Resume usual medications as before.  Keep NSAID use to minimum. High-fiber diet. No driving for 24 hours. Next colonoscopy in 10 years.      PATIENT INSTRUCTIONS POST-ANESTHESIA  IMMEDIATELY FOLLOWING SURGERY:  Do not drive or operate machinery for the first twenty four hours after surgery.  Do not make any important decisions for twenty four hours after surgery or while taking narcotic pain medications or sedatives.  If you develop intractable nausea and vomiting or a severe headache please notify your doctor immediately.  FOLLOW-UP:  Please make an appointment with your surgeon as instructed. You do not need to follow up with anesthesia unless specifically instructed to do so.  WOUND CARE INSTRUCTIONS (if applicable):  Keep a dry clean dressing on the anesthesia/puncture wound site if there is drainage.  Once the wound has quit draining you may leave it open to air.  Generally you should leave the bandage intact for twenty four hours unless there is drainage.  If the epidural site drains for more than 36-48 hours please call the anesthesia department.  QUESTIONS?:  Please feel free to call your physician or the hospital operator if you have any questions, and they will be happy to assist you.         Diverticulosis Diverticulosis is a condition that develops when small pouches (diverticula) form in the wall of the large intestine (colon). The colon is where water is absorbed and stool is formed. The pouches form when the inside layer of the colon pushes through weak spots in the outer layers of the colon. You may have a few pouches or many of them. What are the causes? The cause of this condition is not known. What increases the risk? The following factors may make you more likely to develop this condition:  Being older than age 26. Your risk for this condition increases with age. Diverticulosis is rare among people younger than age 63. By age 28, many people have it.  Eating a  low-fiber diet.  Having frequent constipation.  Being overweight.  Not getting enough exercise.  Smoking.  Taking over-the-counter pain medicines, like aspirin and ibuprofen.  Having a family history of diverticulosis.  What are the signs or symptoms? In most people, there are no symptoms of this condition. If you do have symptoms, they may include:  Bloating.  Cramps in the abdomen.  Constipation or diarrhea.  Pain in the lower left side of the abdomen.  How is this diagnosed? This condition is most often diagnosed during an exam for other colon problems. Because diverticulosis usually has no symptoms, it often cannot be diagnosed independently. This condition may be diagnosed by:  Using a flexible scope to examine the colon (colonoscopy).  Taking an X-ray of the colon after dye has been put into the colon (barium enema).  Doing a CT scan.  How is this treated? You may not need treatment for this condition if you have never developed an infection related to diverticulosis. If you have had an infection before, treatment may include:  Eating a high-fiber diet. This may include eating more fruits, vegetables, and grains.  Taking a fiber supplement.  Taking a live bacteria supplement (probiotic).  Taking medicine to relax your colon.  Taking antibiotic medicines.  Follow these instructions at home:  Drink 6-8 glasses of water or more each day to prevent constipation.  Try not to strain when you have a bowel movement.  If you have had an infection before: ? Eat more fiber as  directed by your health care provider or your diet and nutrition specialist (dietitian). ? Take a fiber supplement or probiotic, if your health care provider approves.  Take over-the-counter and prescription medicines only as told by your health care provider.  If you were prescribed an antibiotic, take it as told by your health care provider. Do not stop taking the antibiotic even if you  start to feel better.  Keep all follow-up visits as told by your health care provider. This is important. Contact a health care provider if:  You have pain in your abdomen.  You have bloating.  You have cramps.  You have not had a bowel movement in 3 days. Get help right away if:  Your pain gets worse.  Your bloating becomes very bad.  You have a fever or chills, and your symptoms suddenly get worse.  You vomit.  You have bowel movements that are bloody or black.  You have bleeding from your rectum. Summary  Diverticulosis is a condition that develops when small pouches (diverticula) form in the wall of the large intestine (colon).  You may have a few pouches or many of them.  This condition is most often diagnosed during an exam for other colon problems.  If you have had an infection related to diverticulosis, treatment may include increasing the fiber in your diet, taking supplements, or taking medicines. This information is not intended to replace advice given to you by your health care provider. Make sure you discuss any questions you have with your health care provider. Document Released: 11/21/2003 Document Revised: 01/13/2016 Document Reviewed: 01/13/2016 Elsevier Interactive Patient Education  2017 [place].    Colonoscopy, Adult, Care After This sheet gives you information about how to care for yourself after your procedure. Your doctor may also give you more specific instructions. If you have problems or questions, call your doctor. Follow these instructions at home: General instructions   For the first 24 hours after the procedure: ? Do not drive or use machinery. ? Do not sign important documents. ? Do not drink alcohol. ? Do your daily activities more slowly than normal. ? Eat foods that are soft and easy to digest. ? Rest often.  Take over-the-counter or prescription medicines only as told by your doctor.  It is up to you to get the results  of your procedure. Ask your doctor, or the department performing the procedure, when your results will be ready. To help cramping and bloating:  Try walking around.  Put heat on your belly (abdomen) as told by your doctor. Use a heat source that your doctor recommends, such as a moist heat pack or a heating pad. ? Put a towel between your skin and the heat source. ? Leave the heat on for 20-30 minutes. ? Remove the heat if your skin turns bright red. This is especially important if you cannot feel pain, heat, or cold. You can get burned. Eating and drinking  Drink enough fluid to keep your pee (urine) clear or pale yellow.  Return to your normal diet as told by your doctor. Avoid heavy or fried foods that are hard to digest.  Avoid drinking alcohol for as long as told by your doctor. Contact a doctor if:  You have blood in your poop (stool) 2-3 days after the procedure. Get help right away if:  You have more than a small amount of blood in your poop.  You see large clumps of tissue (blood clots) in your poop.  Your belly is swollen.  You feel sick to your stomach (nauseous).  You throw up (vomit).  You have a fever.  You have belly pain that gets worse, and medicine does not help your pain. This information is not intended to replace advice given to you by your health care provider. Make sure you discuss any questions you have with your health care provider. Document Released: 03/28/2010 Document Revised: 11/18/2015 Document Reviewed: 11/18/2015 Elsevier Interactive Patient Education  2017 [employer]     Hemorrhoids Hemorrhoids are swollen veins in and around the rectum or anus. There are two types of hemorrhoids:  Internal hemorrhoids. These occur in the veins that are just inside the rectum. They may poke through to the outside and become irritated and painful.  External hemorrhoids. These occur in the veins that are outside of the anus and can be felt as a painful  swelling or hard lump near the anus.  Most hemorrhoids do not cause serious problems, and they can be managed with home treatments such as diet and lifestyle changes. If home treatments do not help your symptoms, procedures can be done to shrink or remove the hemorrhoids. What are the causes? This condition is caused by increased pressure in the anal area. This pressure may result from various things, including:  Constipation.  Straining to have a bowel movement.  Diarrhea.  Pregnancy.  Obesity.  Sitting for long periods of time.  Heavy lifting or other activity that causes you to strain.  Anal sex.  What are the signs or symptoms? Symptoms of this condition include:  Pain.  Anal itching or irritation.  Rectal bleeding.  Leakage of stool (feces).  Anal swelling.  One or more lumps around the anus.  How is this diagnosed? This condition can often be diagnosed through a visual exam. Other exams or tests may also be done, such as:  Examination of the rectal area with a gloved hand (digital rectal exam).  Examination of the anal canal using a small tube (anoscope).  A blood test, if you have lost a significant amount of blood.  A test to look inside the colon (sigmoidoscopy or colonoscopy).  How is this treated? This condition can usually be treated at home. However, various procedures may be done if dietary changes, lifestyle changes, and other home treatments do not help your symptoms. These procedures can help make the hemorrhoids smaller or remove them completely. Some of these procedures involve surgery, and others do not. Common procedures include:  Rubber band ligation. Rubber bands are placed at the base of the hemorrhoids to cut off the blood supply to them.  Sclerotherapy. Medicine is injected into the hemorrhoids to shrink them.  Infrared coagulation. A type of light energy is used to get rid of the hemorrhoids.  Hemorrhoidectomy surgery. The  hemorrhoids are surgically removed, and the veins that supply them are tied off.  Stapled hemorrhoidopexy surgery. A circular stapling device is used to remove the hemorrhoids and use staples to cut off the blood supply to them.  Follow these instructions at home: Eating and drinking  Eat foods that have a lot of fiber in them, such as whole grains, beans, nuts, fruits, and vegetables. Ask your health care provider about taking products that have added fiber (fiber supplements).  Drink enough fluid to keep your urine clear or pale yellow. Managing pain and swelling  Take warm sitz baths for 20 minutes, 3-4 times a day to ease pain and discomfort.  If directed, apply  ice to the affected area. Using ice packs between sitz baths may be helpful. ? Put ice in a plastic bag. ? Place a towel between your skin and the bag. ? Leave the ice on for 20 minutes, 2-3 times a day. General instructions  Take over-the-counter and prescription medicines only as told by your health care provider.  Use medicated creams or suppositories as told.  Exercise regularly.  Go to the bathroom when you have the urge to have a bowel movement. Do not wait.  Avoid straining to have bowel movements.  Keep the anal area dry and clean. Use wet toilet paper or moist towelettes after a bowel movement.  Do not sit on the toilet for long periods of time. This increases blood pooling and pain. Contact a health care provider if:  You have increasing pain and swelling that are not controlled by treatment or medicine.  You have uncontrolled bleeding.  You have difficulty having a bowel movement, or you are unable to have a bowel movement.  You have pain or inflammation outside the area of the hemorrhoids. This information is not intended to replace advice given to you by your health care provider. Make sure you discuss any questions you have with your health care provider. Document Released: 02/21/2000 Document  Revised: 07/24/2015 Document Reviewed: 11/07/2014 Elsevier Interactive Patient Education  2018 [place].    High-Fiber Diet Fiber, also called dietary fiber, is a type of carbohydrate found in fruits, vegetables, whole grains, and beans. A high-fiber diet can have many health benefits. Your health care provider may recommend a high-fiber diet to help:  Prevent constipation. Fiber can make your bowel movements more regular.  Lower your cholesterol.  Relieve hemorrhoids, uncomplicated diverticulosis, or irritable bowel syndrome.  Prevent overeating as part of a weight-loss plan.  Prevent heart disease, type 2 diabetes, and certain cancers.  What is my plan? The recommended daily intake of fiber includes:  38 grams for men under age 9.  15 grams for men over age 61.  39 grams for women under age 9.  65 grams for women over age 41.  You can get the recommended daily intake of dietary fiber by eating a variety of fruits, vegetables, grains, and beans. Your health care provider may also recommend a fiber supplement if it is not possible to get enough fiber through your diet. What do I need to know about a high-fiber diet?  Fiber supplements have not been widely studied for their effectiveness, so it is better to get fiber through food sources.  Always check the fiber content on thenutrition facts label of any prepackaged food. Look for foods that contain at least 5 grams of fiber per serving.  Ask your dietitian if you have questions about specific foods that are related to your condition, especially if those foods are not listed in the following section.  Increase your daily fiber consumption gradually. Increasing your intake of dietary fiber too quickly may cause bloating, cramping, or gas.  Drink plenty of water. Water helps you to digest fiber. What foods can I eat? Grains Whole-grain breads. Multigrain cereal. Oats and oatmeal. Brown rice. Barley. Bulgur wheat.  [place]. Bran muffins. Popcorn. Rye wafer crackers. Vegetables Sweet potatoes. Spinach. Kale. Artichokes. Cabbage. Broccoli. Green peas. Carrots. Squash. Fruits Berries. Pears. Apples. Oranges. Avocados. Prunes and raisins. Dried figs. Meats and Other Protein Sources [place], kidney, pinto, and soy beans. Split peas. Lentils. Nuts and seeds. Dairy Fiber-fortified yogurt. Beverages Fiber-fortified soy milk. Fiber-fortified orange juice.  Other Fiber bars. The items listed above may not be a complete list of recommended foods or beverages. Contact your dietitian for more options. What foods are not recommended? Grains White bread. Pasta made with refined flour. White rice. Vegetables Fried potatoes. Canned vegetables. Well-cooked vegetables. Fruits Fruit juice. Cooked, strained fruit. Meats and Other Protein Sources Fatty cuts of meat. Fried Sales executive or fried fish. Dairy Milk. Yogurt. Cream cheese. Sour cream. Beverages Soft drinks. Other Cakes and pastries. Butter and oils. The items listed above may not be a complete list of foods and beverages to avoid. Contact your dietitian for more information. What are some tips for including high-fiber foods in my diet?  Eat a wide variety of high-fiber foods.  Make sure that half of all grains consumed each day are whole grains.  Replace breads and cereals made from refined flour or white flour with whole-grain breads and cereals.  Replace white rice with brown rice, bulgur wheat, or millet.  Start the day with a breakfast that is high in fiber, such as a cereal that contains at least 5 grams of fiber per serving.  Use beans in place of meat in soups, salads, or pasta.  Eat high-fiber snacks, such as berries, raw vegetables, nuts, or popcorn. This information is not intended to replace advice given to you by your health care provider. Make sure you discuss any questions you have with your health care provider. Document Released:  02/23/2005 Document Revised: 08/01/2015 Document Reviewed: 08/08/2013 Elsevier Interactive Patient Education  [employer].

## 2017-05-07 NOTE — H&P (Signed)
Zoe Bailey is an 52 y.o. female.   Chief Complaint: Patient is here for colonoscopy. HPI: Patient is 52 year old Caucasian female who is here for screening colonoscopy.  She denies abdominal pain change in bowel habits or rectal bleeding. Family history significant for colonic polyps in her mother who has had multiple of these removed. Family history however is negative for colorectal carcinoma.  Past Medical History:  Diagnosis Date  . Arthritis   . Cancer ([place]) 2010 approx   skin cancer  . Complication of anesthesia    Wakes up during all surgeries  . COPD (chronic obstructive pulmonary disease) ([place])   . Depression   . Foot pain, right 03/2014  . Migraine   . Shortness of breath dyspnea   . Sleep apnea   . Toenail fungus     Past Surgical History:  Procedure Laterality Date  . ANKLE FRACTURE SURGERY Right   . BREAST ENHANCEMENT SURGERY    . CARPAL TUNNEL RELEASE Right    x2  . HERNIA REPAIR     umbilical  . NASAL POLYP SURGERY    . POLYPECTOMY      Family History  Problem Relation Age of Onset  . Arthritis Unknown   . Cancer Unknown    Social History:  reports that she has been smoking.  She has a 33.00 pack-year smoking history. she has never used smokeless tobacco. She reports that she does not drink alcohol or use drugs.  Allergies:  Allergies  Allergen Reactions  . Effexor [Venlafaxine] Other (See Comments)    Decreased libido    Medications Prior to Admission  Medication Sig Dispense Refill  . Aspirin-Acetaminophen-Caffeine (GOODY HEADACHE PO) Take 2 packets by mouth daily as needed (headaches).    Marland Kitchen buPROPion (WELLBUTRIN XL) 300 MG 24 hr tablet Take 1 tablet (300 mg total) by mouth daily. 90 tablet 3  . busPIRone (BUSPAR) 7.5 MG tablet TAKE 1 TABLET BY MOUTH 3 TIMES DAILY (Patient taking differently: Take 7.5 mg by mouth once daily as needed for anxiety) 90 tablet 4  . diclofenac (VOLTAREN) 75 MG EC tablet TAKE 1 TABLET BY MOUTH ONCE DAILY FOR GENERALIZED  JOINT PAIN 90 tablet 1  . gabapentin (NEURONTIN) 400 MG capsule TAKE 1 CAPSULE BY MOUTH 3 TIMES DAILY 270 capsule 1  . Multiple Vitamin (MULTIVITAMIN WITH MINERALS) TABS tablet Take 1 tablet by mouth 2 (two) times daily.    . Multiple Vitamins-Minerals (HAIR SKIN AND NAILS FORMULA) TABS Take 1 tablet by mouth 2 (two) times daily.    . nicotine (NICODERM CQ - DOSED IN MG/24 HOURS) 21 mg/24hr patch Place 1 patch (21 mg total) onto the skin daily. 28 patch 0  . [START ON 05/11/2017] nicotine (NICODERM CQ) 14 mg/24hr patch Place 1 patch (14 mg total) onto the skin daily. 28 patch 0  . [START ON 06/10/2017] nicotine (NICODERM CQ) 7 mg/24hr patch Place 1 patch (7 mg total) onto the skin daily. 28 patch 0  . OnabotulinumtoxinA (BOTOX IM) Inject 1 Dose into the muscle every 3 (three) months.    . tiotropium (SPIRIVA) 18 MCG inhalation capsule Place 1 capsule (18 mcg total) into inhaler and inhale daily. (Patient taking differently: Place 18 mcg into inhaler and inhale daily as needed (shortness of breath). ) 90 capsule 1  . triamterene-hydrochlorothiazide (MAXZIDE) 75-50 MG tablet TAKE 1 TABLET BY MOUTH ONCE DAILY 90 tablet 0  . cyclobenzaprine (FLEXERIL) 10 MG tablet TAKE 1 TABLET BY MOUTH 3 TIMES DAILY AS NEEDED FOR  MUSCLE SPASMS. 30 tablet PRN  . montelukast (SINGULAIR) 10 MG tablet Take 1 tablet (10 mg total) by mouth at bedtime. (Patient not taking: Reported on 04/23/2017) 90 tablet 2    No results found for this or any previous visit (from the past 48 hour(s)). No results found.  ROS  Last menstrual period 04/09/2017. Physical Exam  Constitutional: She appears well-developed and well-nourished.  HENT:  Mouth/Throat: Oropharynx is clear and moist.  Eyes: Conjunctivae are normal. No scleral icterus.  Neck: No thyromegaly present.  Cardiovascular: Normal rate, regular rhythm and normal heart sounds.  No murmur heard. Respiratory: Effort normal and breath sounds normal.  GI:  Abdomen is  symmetrical with 2 tattoos at lower abdomen.  Small infraumbilical scar noted.  Abdomen is soft and nontender without organomegaly or masses.  Musculoskeletal: She exhibits no edema.  Neurological: She is alert.  Skin: Skin is warm and dry.     Assessment/Plan Average risk screening colonoscopy.  Hildred Laser, MD 05/07/2017, 8:15 AM

## 2017-05-07 NOTE — Op Note (Signed)
[facility] Patient Name: Zoe Bailey Procedure Date: 05/07/2017 7:55 AM MRN: [id number] Date of Birth: 1965-05-22 Attending MD: Hildred Laser , MD CSN: [id number] Age: 52 Admit Type: Outpatient Procedure:                Colonoscopy Indications:              Screening for colorectal malignant neoplasm Providers:                Hildred Laser, MD, Rosina Lowenstein, RN, Aram Candela Referring MD:             Norwood Levo. Simpson MD, MD Medicines:                Propofol per Anesthesia Complications:            No immediate complications. Estimated Blood Loss:     Estimated blood loss: none. Procedure:                Pre-Anesthesia Assessment:                           - Prior to the procedure, a History and Physical                            was performed, and patient medications and                            allergies were reviewed. The patient's tolerance of                            previous anesthesia was also reviewed. The risks                            and benefits of the procedure and the sedation                            options and risks were discussed with the patient.                            All questions were answered, and informed consent                            was obtained. Prior Anticoagulants: The patient                            last took aspirin 1 day prior to the procedure. ASA                            Grade Assessment: III - A patient with severe                            systemic disease. After reviewing the risks and                            benefits, the patient was deemed in satisfactory  condition to undergo the procedure.                           After obtaining informed consent, the colonoscope                            was passed under direct vision. Throughout the                            procedure, the patient's blood pressure, pulse, and                            oxygen saturations were monitored continuously.  The                            [id number] ([id number]) scope was introduced through                            the and advanced to the the cecum, identified by                            appendiceal orifice and ileocecal valve. The                            colonoscopy was somewhat difficult due to a                            tortuous colon. The patient tolerated the procedure                            well. The quality of the bowel preparation was                            excellent. The ileocecal valve, appendiceal                            orifice, and rectum were photographed. Scope In: 8:31:49 AM Scope Out: 8:52:59 AM Scope Withdrawal Time: 0 hours 10 minutes 6 seconds  Total Procedure Duration: 0 hours 21 minutes 10 seconds  Findings:      The perianal and digital rectal examinations were normal.      A few medium-mouthed diverticula were found in the sigmoid colon.      External hemorrhoids were found during retroflexion. The hemorrhoids       were medium-sized.      The exam was otherwise normal throughout the examined colon. Impression:               - Diverticulosis in the sigmoid colon.                           - External hemorrhoids.                           - No specimens collected. Moderate Sedation:      Per Anesthesia Care Recommendation:           -  Patient has a contact number available for                            emergencies. The signs and symptoms of potential                            delayed complications were discussed with the                            patient. Return to normal activities tomorrow.                            Written discharge instructions were provided to the                            patient.                           - High fiber diet today.                           - Continue present medications.                           - Repeat colonoscopy in 10 years for screening                            purposes. Procedure Code(s):         --- Professional ---                           [phone], Colonoscopy, flexible; diagnostic, including                            collection of specimen(s) by brushing or washing,                            when performed (separate procedure) Diagnosis Code(s):        --- Professional ---                           Z12.11, Encounter for screening for malignant                            neoplasm of colon                           K64.4, Residual hemorrhoidal skin tags                           K57.30, Diverticulosis of large intestine without                            perforation or abscess without bleeding CPT copyright 2016 American Medical Association. All rights reserved. The codes documented in this report are preliminary and upon coder review may  be revised to meet current compliance requirements.  Hildred Laser, MD Hildred Laser, MD 05/07/2017 8:59:21 AM This report has been signed electronically. Number of Addenda: 0

## 2017-05-07 NOTE — Anesthesia Postprocedure Evaluation (Signed)
Anesthesia Post Note  Patient: SAFIRA PROFFIT  Procedure(s) Performed: COLONOSCOPY WITH PROPOFOL (N/A )  Patient location during evaluation: PACU Anesthesia Type: MAC Level of consciousness: awake and alert, oriented and patient cooperative Pain management: pain level controlled Vital Signs Assessment: post-procedure vital signs reviewed and stable Respiratory status: spontaneous breathing and respiratory function stable Cardiovascular status: stable Postop Assessment: no apparent nausea or vomiting Anesthetic complications: no     Last Vitals:  Vitals:   05/07/17 0815  BP: 138/89  Pulse: 73  Temp: 36.7 C  SpO2: 100%    Last Pain: There were no vitals filed for this visit.               ADAMS, AMY A

## 2017-05-07 NOTE — Anesthesia Preprocedure Evaluation (Signed)
Anesthesia Evaluation  Patient identified by MRN, date of birth, ID band Patient awake    Reviewed: Allergy & Precautions, NPO status , Patient's Chart, lab work & pertinent test results  Airway Mallampati: II  TM Distance: >3 FB     Dental  (+) Teeth Intact   Pulmonary shortness of breath and with exertion, sleep apnea , COPD, Current Smoker,    breath sounds clear to auscultation       Cardiovascular hypertension, Pt. on medications + DOE   Rhythm:Regular Rate:Normal     Neuro/Psych  Headaches, PSYCHIATRIC DISORDERS Anxiety Depression    GI/Hepatic Neg liver ROS,   Endo/Other  negative endocrine ROS  Renal/GU negative Renal ROS     Musculoskeletal   Abdominal   Peds  Hematology negative hematology ROS (+)   Anesthesia Other Findings   Reproductive/Obstetrics                            Anesthesia Physical Anesthesia Plan  ASA: III  Anesthesia Plan: MAC   Post-op Pain Management:    Induction: Intravenous  PONV Risk Score and Plan:   Airway Management Planned: Simple Face Mask  Additional Equipment:   Intra-op Plan:   Post-operative Plan:   Informed Consent: I have reviewed the patients History and Physical, chart, labs and discussed the procedure including the risks, benefits and alternatives for the proposed anesthesia with the patient or authorized representative who has indicated his/her understanding and acceptance.     Plan Discussed with:   Anesthesia Plan Comments:         Anesthesia Quick Evaluation

## 2017-05-07 NOTE — Transfer of Care (Signed)
Immediate Anesthesia Transfer of Care Note  Patient: Zoe Bailey  Procedure(s) Performed: COLONOSCOPY WITH PROPOFOL (N/A )  Patient Location: PACU  Anesthesia Type:MAC  Level of Consciousness: awake, alert , oriented and patient cooperative  Airway & Oxygen Therapy: Patient Spontanous Breathing  Post-op Assessment: Report given to RN and Post -op Vital signs reviewed and stable  Post vital signs: Reviewed and stable  Last Vitals:  Vitals:   05/07/17 0815  BP: 138/89  Pulse: 73  Temp: 36.7 C  SpO2: 100%    Last Pain: There were no vitals filed for this visit.       Complications: No apparent anesthesia complications

## 2017-05-11 ENCOUNTER — Encounter (HOSPITAL_COMMUNITY): Payer: Self-pay | Admitting: Internal Medicine

## 2017-05-26 ENCOUNTER — Other Ambulatory Visit: Payer: Self-pay | Admitting: Family Medicine

## 2017-05-26 MED FILL — TRIAMTERENE/HCTZ 75/50 TAB: 75-50 | 90 days supply | Qty: 90 | Fill #0

## 2017-06-16 ENCOUNTER — Other Ambulatory Visit: Payer: Self-pay | Admitting: Family Medicine

## 2017-06-16 DIAGNOSIS — M79642 Pain in left hand: Principal | ICD-10-CM

## 2017-06-16 DIAGNOSIS — M79641 Pain in right hand: Secondary | ICD-10-CM

## 2017-06-16 MED FILL — GABAPENTIN 400 MG CAPSULE: 400 | 90 days supply | Qty: 270 | Fill #0

## 2017-07-02 MED FILL — BUPROPION HCL XL 300 MG TAB: 300 | 90 days supply | Qty: 90 | Fill #1

## 2017-07-02 MED FILL — DICLOFENAC SODIUM 75 MG TAB: 75 | 90 days supply | Qty: 90 | Fill #1

## 2017-07-07 ENCOUNTER — Encounter: Payer: 59 | Admitting: Family Medicine

## 2017-07-14 MED FILL — BOTOX 100 UNITS VIAL: 100 | 28 days supply | Qty: 2 | Fill #0

## 2017-07-21 DIAGNOSIS — M791 Myalgia, unspecified site: Secondary | ICD-10-CM | POA: Diagnosis not present

## 2017-07-21 DIAGNOSIS — G518 Other disorders of facial nerve: Secondary | ICD-10-CM | POA: Diagnosis not present

## 2017-07-21 DIAGNOSIS — G43719 Chronic migraine without aura, intractable, without status migrainosus: Secondary | ICD-10-CM | POA: Diagnosis not present

## 2017-07-21 DIAGNOSIS — M542 Cervicalgia: Secondary | ICD-10-CM | POA: Diagnosis not present

## 2017-07-22 ENCOUNTER — Other Ambulatory Visit: Payer: Self-pay | Admitting: Family Medicine

## 2017-07-23 MED FILL — CYCLOBENZAPRINE 10 MG TAB: 10 | 10 days supply | Qty: 30 | Fill #0

## 2017-08-11 ENCOUNTER — Encounter: Payer: Self-pay | Admitting: Family Medicine

## 2017-08-11 ENCOUNTER — Ambulatory Visit (INDEPENDENT_AMBULATORY_CARE_PROVIDER_SITE_OTHER): Payer: 59 | Admitting: Family Medicine

## 2017-08-11 ENCOUNTER — Telehealth: Payer: Self-pay

## 2017-08-11 ENCOUNTER — Other Ambulatory Visit (HOSPITAL_COMMUNITY)
Admission: RE | Admit: 2017-08-11 | Discharge: 2017-08-11 | Disposition: A | Discharge status: Home / Self Care | Payer: 59 | Source: Ambulatory Visit | Attending: Family Medicine | Admitting: Family Medicine

## 2017-08-11 VITALS — BP 134/86 | HR 78 | Resp 16 | Ht 62.0 in | Wt 168.0 lb

## 2017-08-11 DIAGNOSIS — F418 Other specified anxiety disorders: Secondary | ICD-10-CM

## 2017-08-11 DIAGNOSIS — K648 Other hemorrhoids: Secondary | ICD-10-CM | POA: Diagnosis not present

## 2017-08-11 DIAGNOSIS — E669 Obesity, unspecified: Secondary | ICD-10-CM

## 2017-08-11 DIAGNOSIS — Z0000 Encounter for general adult medical examination without abnormal findings: Secondary | ICD-10-CM

## 2017-08-11 DIAGNOSIS — K5730 Diverticulosis of large intestine without perforation or abscess without bleeding: Secondary | ICD-10-CM | POA: Diagnosis not present

## 2017-08-11 DIAGNOSIS — Z6830 Body mass index (BMI) 30.0-30.9, adult: Secondary | ICD-10-CM | POA: Diagnosis not present

## 2017-08-11 DIAGNOSIS — F17200 Nicotine dependence, unspecified, uncomplicated: Secondary | ICD-10-CM | POA: Diagnosis not present

## 2017-08-11 DIAGNOSIS — Z124 Encounter for screening for malignant neoplasm of cervix: Secondary | ICD-10-CM

## 2017-08-11 DIAGNOSIS — K649 Unspecified hemorrhoids: Secondary | ICD-10-CM | POA: Insufficient documentation

## 2017-08-11 HISTORY — DX: Unspecified hemorrhoids: K64.9

## 2017-08-11 MED ORDER — BUSPIRONE HCL 10 MG PO TABS: 10.0000 mg | ORAL_TABLET | Freq: Three times a day (TID) | ORAL | 3 refills | Status: DC
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
## 2017-08-11 NOTE — Patient Instructions (Addendum)
Follow up in 2 months, call if you need me before  Your buspar has been increased and sent to the pharmacy.   We will call you or send your pap results through mychart.   Please try to reduce your cigarette use.    Diverticulosis Diverticulosis is a condition that develops when small pouches (diverticula) form in the wall of the large intestine (colon). The colon is where water is absorbed and stool is formed. The pouches form when the inside layer of the colon pushes through weak spots in the outer layers of the colon. You may have a few pouches or many of them. What are the causes? The cause of this condition is not known. What increases the risk? The following factors may make you more likely to develop this condition:  Being older than age 24. Your risk for this condition increases with age. Diverticulosis is rare among people younger than age 50. By age 22, many people have it.  Eating a low-fiber diet.  Having frequent constipation.  Being overweight.  Not getting enough exercise.  Smoking.  Taking over-the-counter pain medicines, like aspirin and ibuprofen.  Having a family history of diverticulosis.  What are the signs or symptoms? In most people, there are no symptoms of this condition. If you do have symptoms, they may include:  Bloating.  Cramps in the abdomen.  Constipation or diarrhea.  Pain in the lower left side of the abdomen.  How is this diagnosed? This condition is most often diagnosed during an exam for other colon problems. Because diverticulosis usually has no symptoms, it often cannot be diagnosed independently. This condition may be diagnosed by:  Using a flexible scope to examine the colon (colonoscopy).  Taking an X-ray of the colon after dye has been put into the colon (barium enema).  Doing a CT scan.  How is this treated? You may not need treatment for this condition if you have never developed an infection related to diverticulosis.  If you have had an infection before, treatment may include:  Eating a high-fiber diet. This may include eating more fruits, vegetables, and grains.  Taking a fiber supplement.  Taking a live bacteria supplement (probiotic).  Taking medicine to relax your colon.  Taking antibiotic medicines.  Follow these instructions at home:  Drink 6-8 glasses of water or more each day to prevent constipation.  Try not to strain when you have a bowel movement.  If you have had an infection before: ? Eat more fiber as directed by your health care provider or your diet and nutrition specialist (dietitian). ? Take a fiber supplement or probiotic, if your health care provider approves.  Take over-the-counter and prescription medicines only as told by your health care provider.  If you were prescribed an antibiotic, take it as told by your health care provider. Do not stop taking the antibiotic even if you start to feel better.  Keep all follow-up visits as told by your health care provider. This is important. Contact a health care provider if:  You have pain in your abdomen.  You have bloating.  You have cramps.  You have not had a bowel movement in 3 days. Get help right away if:  Your pain gets worse.  Your bloating becomes very bad.  You have a fever or chills, and your symptoms suddenly get worse.  You vomit.  You have bowel movements that are bloody or black.  You have bleeding from your rectum. Summary  Diverticulosis is a  condition that develops when small pouches (diverticula) form in the wall of the large intestine (colon).  You may have a few pouches or many of them.  This condition is most often diagnosed during an exam for other colon problems.  If you have had an infection related to diverticulosis, treatment may include increasing the fiber in your diet, taking supplements, or taking medicines. This information is not intended to replace advice given to you by your  health care provider. Make sure you discuss any questions you have with your health care provider. Document Released: 11/21/2003 Document Revised: 01/13/2016 Document Reviewed: 01/13/2016 Elsevier Interactive Patient Education  2017 [employer].

## 2017-08-14 ENCOUNTER — Encounter: Payer: Self-pay | Admitting: Family Medicine

## 2017-08-14 NOTE — Progress Notes (Signed)
Zoe Bailey     MRN: [id number]      DOB: [birth year]  HPI: Patient is in for annual physical exam. C/o uncontrolled  depression, has had 4 major losses since this year, biggest of which is her Father's passing. She   Had stopped smoking , but with current  Stress has resumed, though she wants does not feel able to make the commitment currently Wants to review her recent colonoscopy which reports diverticulosis , has questions with regard to the dx as it relates to diet in particular   PE: BP 134/86   Pulse 78   Resp 16   Ht 5\' 2"  (1.575 m)   Wt 168 lb (76.2 kg)   SpO2 96%   BMI 30.73 kg/m   Pleasant  female, alert and oriented x 3, in no cardio-pulmonary distress.Tearful, crying and depressed Afebrile. HEENT No facial trauma or asymetry. Sinuses non tender.  Extra occullar muscles intact,. External ears normal, tympanic membranes clear. Oropharynx moist, no exudate. Neck: supple, no adenopathy,JVD or thyromegaly.No bruits.  Chest: Clear to ascultation bilaterally.No crackles or wheezes.Decreased air entry Non tender to palpation  Breast: No asymetry,no masses or lumps. No tenderness. No nipple discharge or inversion.Bilateral implants No axillary or supraclavicular adenopathy  Cardiovascular system; Heart sounds normal,  S1 and  S2 ,no S3.  No murmur, or thrill. Apical beat not displaced Peripheral pulses normal.  Abdomen: Soft, non tender, no organomegaly or masses. No bruits. Bowel sounds normal. No guarding, tenderness or rebound.  Rectal:  Not examined.  GU: External genitalia normal female genitalia , normal female distribution of hair. No lesions. Urethral meatus normal in size, no  Prolapse, no lesions visibly  Present. Bladder non tender. Vagina pink and moist , with no visible lesions , discharge present . Adequate pelvic support no  cystocele or rectocele noted Cervix pink and appears healthy, no lesions or ulcerations noted, no discharge noted  from os Uterus normal size, no adnexal masses, no cervical motion or adnexal tenderness.   Musculoskeletal exam: Full ROM of spine, hips , shoulders and knees. No deformity ,swelling or crepitus noted. No muscle wasting or atrophy.   Neurologic: Cranial nerves 2 to 12 intact. Power, tone ,sensation and reflexes normal throughout. No disturbance in gait. No tremor.  Skin: Intact, no ulceration, erythema , scaling or rash noted. Pigmentation normal throughout  Psych; Depressed tearful mood, sad affect, crying  Assessment & Plan:  Annual physical exam Annual exam as documented. Counseling done  re healthy lifestyle involving commitment to 150 minutes exercise per week, heart healthy diet, and attaining healthy weight.The importance of adequate sleep also discussed.  Changes in health habits are decided on by the patient with goals and time frames  set for achieving them. Immunization and cancer screening needs are specifically addressed at this visit.   Depression with anxiety Uncontrolled , increase dose of buspar, already on max dose of welbutrin, refer to telepsych f/u in 6 weeks  NICOTINE ADDICTION Asked: confirms currently smokes 5 to 7 cigarettes/ day assess: wants to quit, but will not set  A date Advise: needs to quit to improve breathing, reduce cancer  And heart disease risk and stroke Assist: currently taking wellbutrin, given QUIT LINE AND QUIT SMOKING TIPS aRRANGE F/U IN 6 WEEKS TIME SPENT 4 Mins   Obesity (BMI 30.0-34.9) Deteriorated. Patient re-educated about  the importance of commitment to a  minimum of 150 minutes of exercise per week.  The importance of healthy food choices  with portion control discussed. Encouraged to start a food diary, count calories and to consider  joining a support group. Sample diet sheets offered. Goals set by the patient for the next several months.   Weight /BMI 08/11/2017 05/03/2017 04/12/2017  WEIGHT 168 lb 167 lb 168 lb    HEIGHT 5\' 2"  5\' 2"  5\' 2"   BMI 30.73 kg/m2 30.54 kg/m2 30.73 kg/m2

## 2017-08-14 NOTE — Assessment & Plan Note (Signed)
Deteriorated. Patient re-educated about  the importance of commitment to a  minimum of 150 minutes of exercise per week.  The importance of healthy food choices with portion control discussed. Encouraged to start a food diary, count calories and to consider  joining a support group. Sample diet sheets offered. Goals set by the patient for the next several months.   Weight /BMI 08/11/2017 05/03/2017 04/12/2017  WEIGHT 168 lb 167 lb 168 lb  HEIGHT 5\' 2"  5\' 2"  5\' 2"   BMI 30.73 kg/m2 30.54 kg/m2 30.73 kg/m2

## 2017-08-14 NOTE — Assessment & Plan Note (Signed)
Annual exam as documented. Counseling done  re healthy lifestyle involving commitment to 150 minutes exercise per week, heart healthy diet, and attaining healthy weight.The importance of adequate sleep also discussed. Changes in health habits are decided on by the patient with goals and time frames  set for achieving them. Immunization and cancer screening needs are specifically addressed at this visit. 

## 2017-08-14 NOTE — Assessment & Plan Note (Signed)
Asked: confirms currently smokes 5 to 7 cigarettes/ day assess: wants to quit, but will not set  A date Advise: needs to quit to improve breathing, reduce cancer  And heart disease risk and stroke Assist: currently taking wellbutrin, given QUIT LINE AND QUIT SMOKING TIPS aRRANGE F/U IN 6 WEEKS TIME SPENT 4 Mins

## 2017-08-14 NOTE — Assessment & Plan Note (Signed)
Uncontrolled , increase dose of buspar, already on max dose of welbutrin, refer to telepsych f/u in 6 weeks

## 2017-08-16 ENCOUNTER — Encounter: Payer: Self-pay | Admitting: Family Medicine

## 2017-08-16 ENCOUNTER — Encounter (HOSPITAL_COMMUNITY): Payer: Self-pay | Admitting: Psychiatry

## 2017-08-16 LAB — CYTOLOGY - PAP
ADEQUACY: ABSENT
Diagnosis: NEGATIVE
HPV: NOT DETECTED
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
## 2017-08-18 NOTE — Telephone Encounter (Signed)
This encounter was created in error - please disregard.  This encounter was created in error - please disregard.

## 2017-09-01 ENCOUNTER — Telehealth (HOSPITAL_COMMUNITY): Payer: Self-pay

## 2017-09-01 ENCOUNTER — Telehealth: Payer: Self-pay

## 2017-09-01 DIAGNOSIS — M791 Myalgia, unspecified site: Secondary | ICD-10-CM | POA: Diagnosis not present

## 2017-09-01 DIAGNOSIS — R51 Headache: Secondary | ICD-10-CM | POA: Diagnosis not present

## 2017-09-01 DIAGNOSIS — G43719 Chronic migraine without aura, intractable, without status migrainosus: Secondary | ICD-10-CM | POA: Diagnosis not present

## 2017-09-01 DIAGNOSIS — M542 Cervicalgia: Secondary | ICD-10-CM | POA: Diagnosis not present

## 2017-09-01 NOTE — Telephone Encounter (Signed)
Scheduled an appt with Dr. Modesta Messing for 10/26/2017.  After the patent has completed her appt with Dr.Hisada then she will be on the inactive list with [facility] services.

## 2017-09-01 NOTE — Telephone Encounter (Signed)
Writer spoke briefly to the patient and was able to obtain updated PHQ-9 and GAD-7 score.  Patient reports that she will call men back after her meeting.

## 2017-09-13 ENCOUNTER — Other Ambulatory Visit: Payer: Self-pay | Admitting: Family Medicine

## 2017-09-13 MED FILL — TRIAMTERENE/HCTZ 75/50 TAB: 75-50 | 90 days supply | Qty: 90 | Fill #0

## 2017-09-30 ENCOUNTER — Other Ambulatory Visit: Payer: Self-pay | Admitting: Family Medicine

## 2017-09-30 MED FILL — BUPROPION HCL XL 300 MG TAB: 300 | 90 days supply | Qty: 90 | Fill #2

## 2017-09-30 MED FILL — DICLOFENAC SODIUM 75 MG TAB: 75 | 90 days supply | Qty: 90 | Fill #0

## 2017-10-07 DIAGNOSIS — M25561 Pain in right knee: Secondary | ICD-10-CM | POA: Diagnosis not present

## 2017-10-08 MED FILL — CYCLOBENZAPRINE 10 MG TAB: 10 | 10 days supply | Qty: 30 | Fill #1

## 2017-10-12 ENCOUNTER — Other Ambulatory Visit: Payer: Self-pay

## 2017-10-12 ENCOUNTER — Encounter: Payer: Self-pay | Admitting: Family Medicine

## 2017-10-12 ENCOUNTER — Ambulatory Visit (INDEPENDENT_AMBULATORY_CARE_PROVIDER_SITE_OTHER): Payer: 59 | Admitting: Family Medicine

## 2017-10-12 VITALS — BP 120/70 | HR 83 | Resp 12 | Ht 62.0 in | Wt 166.1 lb

## 2017-10-12 DIAGNOSIS — G43111 Migraine with aura, intractable, with status migrainosus: Secondary | ICD-10-CM | POA: Diagnosis not present

## 2017-10-12 DIAGNOSIS — F418 Other specified anxiety disorders: Secondary | ICD-10-CM | POA: Diagnosis not present

## 2017-10-12 DIAGNOSIS — I10 Essential (primary) hypertension: Secondary | ICD-10-CM | POA: Diagnosis not present

## 2017-10-12 DIAGNOSIS — F17200 Nicotine dependence, unspecified, uncomplicated: Secondary | ICD-10-CM

## 2017-10-12 DIAGNOSIS — E669 Obesity, unspecified: Secondary | ICD-10-CM

## 2017-10-12 NOTE — Patient Instructions (Signed)
F/U in early December, call if you need me before  Thankful you are improved and that you have an appointment with Dr Modesta Messing, you are heading in the right direction  Now at 7 cigarettes/ day, , and doing some execise and going out more so all good changes, very good, nd always baby steps.   Blood pressure is excellent  Continue current medications

## 2017-10-12 NOTE — Progress Notes (Deleted)
Psychiatric Initial Adult Assessment   Patient Identification: Zoe Bailey MRN:  [id number] Date of Evaluation:  10/12/2017 Referral Source: *** Chief Complaint:   Visit Diagnosis: No diagnosis found.  History of Present Illness:   Zoe Bailey is a 52 y.o. year old female with a history of depression, anxiety, who is referred for     Associated Signs/Symptoms: Depression Symptoms:  {DEPRESSION SYMPTOMS:20000} (Hypo) Manic Symptoms:  {BHH MANIC SYMPTOMS:22872} Anxiety Symptoms:  {BHH ANXIETY SYMPTOMS:22873} Psychotic Symptoms:  {BHH PSYCHOTIC SYMPTOMS:22874} PTSD Symptoms: {BHH PTSD SYMPTOMS:22875}  Past Psychiatric History:  Outpatient:  Psychiatry admission:  Previous suicide attempt:  Past trials of medication:  History of violence:   Previous Psychotropic Medications: {YES/NO:21197}  Substance Abuse History in the last 12 months:  {yes no:314532}  Consequences of Substance Abuse: {BHH CONSEQUENCES OF SUBSTANCE ABUSE:22880}  Past Medical History:  Past Medical History:  Diagnosis Date  . Arthritis   . Cancer ([place]) 2010 approx   skin cancer  . Complication of anesthesia    Wakes up during all surgeries  . COPD (chronic obstructive pulmonary disease) ([place])   . Depression   . Foot pain, right 03/2014  . Migraine   . Shortness of breath dyspnea   . Sleep apnea   . Toenail fungus     Past Surgical History:  Procedure Laterality Date  . ANKLE FRACTURE SURGERY Right   . BREAST ENHANCEMENT SURGERY    . CARPAL TUNNEL RELEASE Right    x2  . COLONOSCOPY WITH PROPOFOL N/A 05/07/2017   Procedure: COLONOSCOPY WITH PROPOFOL;  Surgeon: Rogene Houston, MD;  Location: [facility];  Service: Endoscopy;  Laterality: N/A;  8:30  . HERNIA REPAIR     umbilical  . NASAL POLYP SURGERY    . POLYPECTOMY      Family Psychiatric History: ***  Family History:  Family History  Problem Relation Age of Onset  . Arthritis Unknown   . Cancer Unknown     Social History:    Social History   Socioeconomic History  . Marital status: Divorced    Spouse name: Not on file  . Number of children: 1  . Years of education: college  . Highest education level: Not on file  Occupational History  . Occupation: Electrical engineer: [employer]  . Financial resource strain: Not on file  . Food insecurity:    Worry: Not on file    Inability: Not on file  . Transportation needs:    Medical: Not on file    Non-medical: Not on file  Tobacco Use  . Smoking status: Current Every Day Smoker    Packs/day: 0.25    Years: 33.00    Pack years: 8.25  . Smokeless tobacco: Never Used  Substance and Sexual Activity  . Alcohol use: No    Alcohol/week: 0.0 oz  . Drug use: No  . Sexual activity: Yes    Birth control/protection: None  Lifestyle  . Physical activity:    Days per week: Not on file    Minutes per session: Not on file  . Stress: Not on file  Relationships  . Social connections:    Talks on phone: Not on file    Gets together: Not on file    Attends religious service: Not on file    Active member of club or organization: Not on file    Attends meetings of clubs or organizations: Not on file  Relationship status: Not on file  Other Topics Concern  . Not on file  Social History Narrative   Divorced. Lives alone. Has 2 dogs    Additional Social History: ***  Allergies:   Allergies  Allergen Reactions  . Effexor [Venlafaxine] Other (See Comments)    Decreased libido    Metabolic Disorder Labs: Lab Results  Component Value Date   HGBA1C 5.9 (H) 04/12/2017   MPG 122.63 04/12/2017   MPG 111 06/11/2015   No results found for: PROLACTIN Lab Results  Component Value Date   CHOL 191 04/12/2017   TRIG 129 04/12/2017   HDL 63 04/12/2017   CHOLHDL 3.0 04/12/2017   VLDL 26 04/12/2017   LDLCALC 102 (H) 04/12/2017   LDLCALC 100 (H) 04/10/2016     Current Medications: Current Outpatient Medications   Medication Sig Dispense Refill  . Aspirin-Acetaminophen-Caffeine (GOODY HEADACHE PO) Take 2 packets by mouth daily as needed (headaches).    Marland Kitchen buPROPion (WELLBUTRIN XL) 300 MG 24 hr tablet Take 1 tablet (300 mg total) by mouth daily. 90 tablet 3  . busPIRone (BUSPAR) 10 MG tablet Take 1 tablet (10 mg total) by mouth 3 (three) times daily. 90 tablet 3  . cyclobenzaprine (FLEXERIL) 10 MG tablet TAKE 1 TABLET BY MOUTH 3 TIMES DAILY AS NEEDED FOR MUSCLE SPASMS. 30 tablet PRN  . diclofenac (VOLTAREN) 75 MG EC tablet TAKE 1 TABLET BY MOUTH ONCE DAILY FOR GENERALIZED JOINT PAIN 90 tablet 1  . gabapentin (NEURONTIN) 400 MG capsule TAKE 1 CAPSULE BY MOUTH 3 TIMES DAILY 270 capsule 1  . Multiple Vitamin (MULTIVITAMIN WITH MINERALS) TABS tablet Take 1 tablet by mouth 2 (two) times daily.    . Multiple Vitamins-Minerals (HAIR SKIN AND NAILS FORMULA) TABS Take 1 tablet by mouth 2 (two) times daily.    . OnabotulinumtoxinA (BOTOX IM) Inject 1 Dose into the muscle every 3 (three) months.    . tiotropium (SPIRIVA) 18 MCG inhalation capsule Place 1 capsule (18 mcg total) into inhaler and inhale daily. (Patient taking differently: Place 18 mcg into inhaler and inhale daily as needed (shortness of breath). ) 90 capsule 1  . triamterene-hydrochlorothiazide (MAXZIDE) 75-50 MG tablet TAKE 1 TABLET BY MOUTH ONCE DAILY 90 tablet 0   No current facility-administered medications for this visit.     Neurologic: Headache: No Seizure: No Paresthesias:No  Musculoskeletal: Strength & Muscle Tone: within normal limits Gait & Station: normal Patient leans: N/A  Psychiatric Specialty Exam: ROS  There were no vitals taken for this visit.There is no height or weight on file to calculate BMI.  General Appearance: Fairly Groomed  Eye Contact:  Good  Speech:  Clear and Coherent  Volume:  Normal  Mood:  {BHH MOOD:22306}  Affect:  {Affect (PAA):22687}  Thought Process:  Coherent  Orientation:  Full (Time, Place, and  Person)  Thought Content:  Logical  Suicidal Thoughts:  {ST/HT (PAA):22692}  Homicidal Thoughts:  {ST/HT (PAA):22692}  Memory:  Immediate;   Good  Judgement:  {Judgement (PAA):22694}  Insight:  {Insight (PAA):22695}  Psychomotor Activity:  Normal  Concentration:  Concentration: Good and Attention Span: Good  Recall:  Good  Fund of Knowledge:Good  Language: Good  Akathisia:  No  Handed:  Right  AIMS (if indicated):  N/A  Assets:  Communication Skills Desire for Improvement  ADL's:  Intact  Cognition: WNL  Sleep:  ***   Assessment  Plan  The patient demonstrates the following risk factors for suicide: Chronic risk factors for suicide include: {  Chronic Risk Factors for RWCHJSC:38377939}. Acute risk factors for suicide include: {Acute Risk Factors for SUGAYGE:72072182}. Protective factors for this patient include: {Protective Factors for Suicide EQFD:74451460}. Considering these factors, the overall suicide risk at this point appears to be {Desc; low/moderate/high:110033}. Patient {ACTION; IS/IS QNV:98721587} appropriate for outpatient follow up.   Treatment Plan Summary: Plan as above   Norman Clay, MD 8/6/20194:29 PM

## 2017-10-13 MED FILL — BOTOX 100 UNITS VIAL: 100 | 28 days supply | Qty: 2 | Fill #1

## 2017-10-20 ENCOUNTER — Encounter: Payer: Self-pay | Admitting: Family Medicine

## 2017-10-20 DIAGNOSIS — M791 Myalgia, unspecified site: Secondary | ICD-10-CM | POA: Diagnosis not present

## 2017-10-20 DIAGNOSIS — G43719 Chronic migraine without aura, intractable, without status migrainosus: Secondary | ICD-10-CM | POA: Diagnosis not present

## 2017-10-20 DIAGNOSIS — G518 Other disorders of facial nerve: Secondary | ICD-10-CM | POA: Diagnosis not present

## 2017-10-20 DIAGNOSIS — M542 Cervicalgia: Secondary | ICD-10-CM | POA: Diagnosis not present

## 2017-10-20 NOTE — Assessment & Plan Note (Signed)
Improved and will start being treated by psychiatry , which is very good, not suicidal or homicidal

## 2017-10-20 NOTE — Assessment & Plan Note (Signed)
Asked: confirms currently smokes 7/day Assess; wants to quit , needs help Advise; need to quit , increased risk of stroke , heart disease and cancer Assist: counseled for 5 minutes and she is to continue wellbutrin Arrange; f/u in 3 to 4 month

## 2017-10-20 NOTE — Assessment & Plan Note (Signed)
Managed by neurology, and headaches are well controlled

## 2017-10-20 NOTE — Assessment & Plan Note (Signed)
Controlled, no change in medication DASH diet and commitment to daily physical activity for a minimum of 30 minutes discussed and encouraged, as a part of hypertension management. The importance of attaining a healthy weight is also discussed.  BP/Weight 10/12/2017 08/11/2017 05/07/2017 05/03/2017 04/12/2017 91/22/5834 08/08/1945  Systolic BP 125 271 292 909 030 149 969  Diastolic BP 70 86 76 89 84 84 92  Wt. (Lbs) 166.08 168 - 167 168 168.75 169  BMI 30.38 30.73 - 30.54 30.73 30.86 30.91

## 2017-10-20 NOTE — Assessment & Plan Note (Signed)
Unchanged Patient re-educated about  the importance of commitment to a  minimum of 150 minutes of exercise per week.  The importance of healthy food choices with portion control discussed. Encouraged to start a food diary, count calories and to consider  joining a support group. Sample diet sheets offered. Goals set by the patient for the next several months.   Weight /BMI 10/12/2017 08/11/2017 05/03/2017  WEIGHT 166 lb 1.3 oz 168 lb 167 lb  HEIGHT 5\' 2"  5\' 2"  5\' 2"   BMI 30.38 kg/m2 30.73 kg/m2 30.54 kg/m2

## 2017-10-20 NOTE — Progress Notes (Signed)
Zoe Bailey     MRN: [id number]      DOB: 10/22/1965   HPI Zoe Bailey is here for follow up and re-evaluation of chronic medical conditions, medication management and review of any available recent lab and radiology data.  Preventive health is updated, specifically  Cancer screening and Immunization.   Has upcoming appointment with psychiatry and is happy about that.  Is making every effort to become more engaged in outside of the home activities The PT denies any adverse reactions to current medications since the last visit.  There are no new concerns.  There are no specific complaints   ROS Denies recent fever or chills. Denies sinus pressure, nasal congestion, ear pain or sore throat. Denies chest congestion, productive cough or wheezing. Denies chest pains, palpitations and leg swelling Denies abdominal pain, nausea, vomiting,diarrhea or constipation.   Denies dysuria, frequency, hesitancy or incontinence. Denies joint pain, swelling and limitation in mobility. Denies headaches, seizures, numbness, or tingling. Denies depression, anxiety or insomnia. Denies skin break down or rash.   PE  BP 120/70 (BP Location: Left Arm, Patient Position: Sitting, Cuff Size: Large)   Pulse 83   Resp 12   Ht 5\' 2"  (1.575 m)   Wt 166 lb 1.3 oz (75.3 kg)   SpO2 95% Comment: room air  BMI 30.38 kg/m   Patient alert and oriented and in no cardiopulmonary distress.  HEENT: No facial asymmetry, EOMI,   oropharynx pink and moist.  Neck supple no JVD, no mass.  Chest: Clear to auscultation bilaterally.  CVS: S1, S2 no murmurs, no S3.Regular rate.  ABD: Soft non tender.   Ext: No edema  MS: Adequate ROM spine, shoulders, hips and knees.  Skin: Intact, no ulcerations or rash noted.  Psych: Good eye contact, normal affect. Memory intact not anxious or depressed appearing.  CNS: CN 2-12 intact, power,  normal throughout.no focal deficits noted.   Assessment & Plan Essential  hypertension Controlled, no change in medication DASH diet and commitment to daily physical activity for a minimum of 30 minutes discussed and encouraged, as a part of hypertension management. The importance of attaining a healthy weight is also discussed.  BP/Weight 10/12/2017 08/11/2017 05/07/2017 05/03/2017 04/12/2017 57/84/6962 11/12/2839  Systolic BP 324 401 027 253 664 403 474  Diastolic BP 70 86 76 89 84 84 92  Wt. (Lbs) 166.08 168 - 167 168 168.75 169  BMI 30.38 30.73 - 30.54 30.73 30.86 30.91       Depression with anxiety Improved and will start being treated by psychiatry , which is very good, not suicidal or homicidal  Migraine Managed by neurology, and headaches are well controlled  Obesity (BMI 30.0-34.9) Unchanged Patient re-educated about  the importance of commitment to a  minimum of 150 minutes of exercise per week.  The importance of healthy food choices with portion control discussed. Encouraged to start a food diary, count calories and to consider  joining a support group. Sample diet sheets offered. Goals set by the patient for the next several months.   Weight /BMI 10/12/2017 08/11/2017 05/03/2017  WEIGHT 166 lb 1.3 oz 168 lb 167 lb  HEIGHT 5\' 2"  5\' 2"  5\' 2"   BMI 30.38 kg/m2 30.73 kg/m2 30.54 kg/m2      NICOTINE ADDICTION Asked: confirms currently smokes 7/day Assess; wants to quit , needs help Advise; need to quit , increased risk of stroke , heart disease and cancer Assist: counseled for 5 minutes and she is to continue  wellbutrin Arrange; f/u in 3 to 4 month

## 2017-10-26 ENCOUNTER — Ambulatory Visit (HOSPITAL_COMMUNITY): Payer: 59 | Admitting: Psychiatry

## 2017-12-01 MED FILL — CYCLOBENZAPRINE 10 MG TAB: 10 | 10 days supply | Qty: 30 | Fill #2

## 2017-12-06 DIAGNOSIS — M542 Cervicalgia: Secondary | ICD-10-CM | POA: Diagnosis not present

## 2017-12-06 DIAGNOSIS — R51 Headache: Secondary | ICD-10-CM | POA: Diagnosis not present

## 2017-12-06 DIAGNOSIS — M791 Myalgia, unspecified site: Secondary | ICD-10-CM | POA: Diagnosis not present

## 2017-12-06 DIAGNOSIS — G43719 Chronic migraine without aura, intractable, without status migrainosus: Secondary | ICD-10-CM | POA: Diagnosis not present

## 2017-12-06 MED FILL — DOXYCYCLINE HYC 100 MG CAPS: 100 | 10 days supply | Qty: 20 | Fill #0

## 2017-12-16 DIAGNOSIS — M25561 Pain in right knee: Secondary | ICD-10-CM | POA: Diagnosis not present

## 2017-12-20 DIAGNOSIS — M25561 Pain in right knee: Secondary | ICD-10-CM | POA: Diagnosis not present

## 2017-12-29 DIAGNOSIS — M25561 Pain in right knee: Secondary | ICD-10-CM | POA: Diagnosis not present

## 2018-01-04 DIAGNOSIS — X32XXXA Exposure to sunlight, initial encounter: Secondary | ICD-10-CM | POA: Diagnosis not present

## 2018-01-04 DIAGNOSIS — L570 Actinic keratosis: Secondary | ICD-10-CM | POA: Diagnosis not present

## 2018-01-06 DIAGNOSIS — M1711 Unilateral primary osteoarthritis, right knee: Secondary | ICD-10-CM | POA: Diagnosis not present

## 2018-01-06 DIAGNOSIS — S83231A Complex tear of medial meniscus, current injury, right knee, initial encounter: Secondary | ICD-10-CM | POA: Diagnosis not present

## 2018-01-10 MED FILL — GABAPENTIN 400 MG CAPSULE: 400 | 90 days supply | Qty: 270 | Fill #1

## 2018-01-10 MED FILL — BuPROPion HCL ER (XL) 300 M: 300 | 90 days supply | Qty: 90 | Fill #3

## 2018-01-10 MED FILL — CYCLOBENZAPRINE 10 MG TAB: 10 | 10 days supply | Qty: 30 | Fill #3

## 2018-01-10 MED FILL — DICLOFENAC SODIUM 75 MG TAB: 75 | 90 days supply | Qty: 90 | Fill #1

## 2018-01-10 MED FILL — busPIRone HCL 10 MG TABS: 10 | 30 days supply | Qty: 90 | Fill #0

## 2018-01-13 MED FILL — BOTOX 100 UNITS VIAL: 100 | 28 days supply | Qty: 2 | Fill #2

## 2018-01-19 DIAGNOSIS — M791 Myalgia, unspecified site: Secondary | ICD-10-CM | POA: Diagnosis not present

## 2018-01-19 DIAGNOSIS — G43719 Chronic migraine without aura, intractable, without status migrainosus: Secondary | ICD-10-CM | POA: Diagnosis not present

## 2018-01-19 DIAGNOSIS — M542 Cervicalgia: Secondary | ICD-10-CM | POA: Diagnosis not present

## 2018-01-19 DIAGNOSIS — G518 Other disorders of facial nerve: Secondary | ICD-10-CM | POA: Diagnosis not present

## 2018-01-24 ENCOUNTER — Telehealth: Payer: Self-pay | Admitting: Family Medicine

## 2018-01-24 ENCOUNTER — Encounter (HOSPITAL_COMMUNITY): Payer: Self-pay | Admitting: *Deleted

## 2018-01-24 ENCOUNTER — Telehealth: Payer: Self-pay

## 2018-01-24 DIAGNOSIS — R7302 Impaired glucose tolerance (oral): Secondary | ICD-10-CM

## 2018-01-24 DIAGNOSIS — I270 Primary pulmonary hypertension: Secondary | ICD-10-CM

## 2018-01-24 DIAGNOSIS — E785 Hyperlipidemia, unspecified: Secondary | ICD-10-CM

## 2018-01-24 DIAGNOSIS — E1169 Type 2 diabetes mellitus with other specified complication: Secondary | ICD-10-CM

## 2018-01-24 NOTE — Telephone Encounter (Signed)
Pls advise I want her to get hBA1C, fasting chem 7 and eGFr and lipid drawn this week asap, after the result is available I will signe her clearance, also needs CCUA sent to lab Pls order the tests, dx are IGT, hypertension and hyperlipidemia, also rept CBC as a pre op  She has an n  appt on 12/02 and needs to [facility]

## 2018-01-24 NOTE — Telephone Encounter (Signed)
Labs ordered.

## 2018-01-25 ENCOUNTER — Telehealth: Payer: Self-pay

## 2018-01-25 DIAGNOSIS — Z01818 Encounter for other preprocedural examination: Secondary | ICD-10-CM

## 2018-01-25 NOTE — Telephone Encounter (Signed)
Labs ordered. Patient notified.

## 2018-01-25 NOTE — Telephone Encounter (Signed)
Labs ordered.

## 2018-01-26 DIAGNOSIS — I270 Primary pulmonary hypertension: Secondary | ICD-10-CM | POA: Diagnosis not present

## 2018-01-26 DIAGNOSIS — E785 Hyperlipidemia, unspecified: Secondary | ICD-10-CM | POA: Diagnosis not present

## 2018-01-26 DIAGNOSIS — R7302 Impaired glucose tolerance (oral): Secondary | ICD-10-CM | POA: Diagnosis not present

## 2018-01-26 DIAGNOSIS — E1169 Type 2 diabetes mellitus with other specified complication: Secondary | ICD-10-CM | POA: Diagnosis not present

## 2018-01-27 ENCOUNTER — Encounter: Payer: Self-pay | Admitting: Family Medicine

## 2018-01-27 ENCOUNTER — Ambulatory Visit (HOSPITAL_COMMUNITY)
Admission: RE | Admit: 2018-01-27 | Discharge: 2018-01-27 | Disposition: A | Discharge status: Home / Self Care | Payer: 59 | Source: Ambulatory Visit | Attending: Family Medicine | Admitting: Family Medicine

## 2018-01-27 ENCOUNTER — Other Ambulatory Visit: Payer: Self-pay | Admitting: Family Medicine

## 2018-01-27 DIAGNOSIS — Z01811 Encounter for preprocedural respiratory examination: Secondary | ICD-10-CM | POA: Insufficient documentation

## 2018-01-27 DIAGNOSIS — I10 Essential (primary) hypertension: Secondary | ICD-10-CM

## 2018-01-27 DIAGNOSIS — R918 Other nonspecific abnormal finding of lung field: Secondary | ICD-10-CM | POA: Insufficient documentation

## 2018-01-27 DIAGNOSIS — F17210 Nicotine dependence, cigarettes, uncomplicated: Secondary | ICD-10-CM

## 2018-01-27 LAB — COMPLETE METABOLIC PANEL WITH GFR
AG Ratio: 1.5 (calc) (ref 1.0–2.5)
ALBUMIN MSPROF: 4.1 g/dL (ref 3.6–5.1)
ALT: 14 U/L (ref 6–29)
AST: 16 U/L (ref 10–35)
Alkaline phosphatase (APISO): 82 U/L (ref 33–130)
BILIRUBIN TOTAL: 0.4 mg/dL (ref 0.2–1.2)
BUN: 14 mg/dL (ref 7–25)
CALCIUM: 9.2 mg/dL (ref 8.6–10.4)
CHLORIDE: 103 mmol/L (ref 98–110)
CO2: 29 mmol/L (ref 20–32)
CREATININE: 0.86 mg/dL (ref 0.50–1.05)
GFR, EST AFRICAN AMERICAN: 90 mL/min/{1.73_m2} (ref >=60)
GFR, EST NON AFRICAN AMERICAN: 78 mL/min/{1.73_m2} (ref >=60)
Globulin: 2.7 g/dL (calc) (ref 1.9–3.7)
Glucose, Bld: 90 mg/dL (ref 65–99)
Potassium: 4.8 mmol/L (ref 3.5–5.3)
Sodium: 139 mmol/L (ref 135–146)
TOTAL PROTEIN: 6.8 g/dL (ref 6.1–8.1)

## 2018-01-27 LAB — URINALYSIS
Bilirubin Urine: NEGATIVE
GLUCOSE, UA: NEGATIVE
Hgb urine dipstick: NEGATIVE
Ketones, ur: NEGATIVE
Nitrite: NEGATIVE
PH: 7.5 (ref 5.0–8.0)
Protein, ur: NEGATIVE
SPECIFIC GRAVITY, URINE: 1.02 (ref 1.001–1.03)

## 2018-01-27 LAB — HEMOGLOBIN A1C
Hgb A1c MFr Bld: 5.2 % of total Hgb (ref <5.7)
Mean Plasma Glucose: 103 (calc)
eAG (mmol/L): 5.7 (calc)

## 2018-01-27 LAB — LIPID PANEL
Cholesterol: 191 mg/dL (ref <200)
HDL: 72 mg/dL (ref >50)
LDL Cholesterol (Calc): 104 mg/dL (calc) — ABNORMAL HIGH
Non-HDL Cholesterol (Calc): 119 mg/dL (calc) (ref <130)
TRIGLYCERIDES: 66 mg/dL (ref <150)
Total CHOL/HDL Ratio: 2.7 (calc) (ref <5.0)

## 2018-01-28 ENCOUNTER — Encounter: Payer: Self-pay | Admitting: Family Medicine

## 2018-02-07 ENCOUNTER — Encounter: Payer: Self-pay | Admitting: Family Medicine

## 2018-02-07 ENCOUNTER — Ambulatory Visit (INDEPENDENT_AMBULATORY_CARE_PROVIDER_SITE_OTHER): Payer: 59 | Admitting: Family Medicine

## 2018-02-07 VITALS — BP 126/74 | HR 88 | Resp 12 | Ht 62.0 in | Wt 174.1 lb

## 2018-02-07 DIAGNOSIS — F17200 Nicotine dependence, unspecified, uncomplicated: Secondary | ICD-10-CM | POA: Diagnosis not present

## 2018-02-07 DIAGNOSIS — F17210 Nicotine dependence, cigarettes, uncomplicated: Secondary | ICD-10-CM | POA: Diagnosis not present

## 2018-02-07 DIAGNOSIS — R8290 Unspecified abnormal findings in urine: Secondary | ICD-10-CM | POA: Diagnosis not present

## 2018-02-07 DIAGNOSIS — Z01818 Encounter for other preprocedural examination: Secondary | ICD-10-CM | POA: Diagnosis not present

## 2018-02-07 NOTE — Patient Instructions (Signed)
F/u in 4 months, call if you need me sooner  You are medically cleared for arthroscopy  Need to QUIT smoking today  Re send urine if needed  Stop voltaren , fioricet starting th tomorrow, continue other meds  FML form will be completed on your behalf for 6 weeks out due to knee surgery planned for 02/17/2018 , we will call you to collect this week   Thankful much better

## 2018-02-07 NOTE — Progress Notes (Signed)
   Zoe Bailey     MRN: [id number]      DOB: 1966/02/15   HPI Zoe Bailey is here for follow up and re-evaluation of chronic medical conditions, medication management and review of any available recent lab and radiology data.  Also for medical clearance for right knee arthroscopy scheduled for 02/17/2018 Preventive health is updated, specifically  Cancer screening and Immunization.   Questions or concerns regarding consultations or procedures which the PT has had in the interim are  addressed. The PT denies any adverse reactions to current medications since the last visit.  Malodorous urine need to f/u c/s ROS Denies recent fever or chills. Denies sinus pressure, nasal congestion, ear pain or sore throat. Denies chest congestion, productive cough or wheezing. Denies chest pains, palpitations and leg swelling Denies abdominal pain, nausea, vomiting,diarrhea or constipation.    Denies headaches, seizures, numbness, or tingling. Denies depression, anxiety or insomnia. Denies skin break down or rash.   PE  BP 126/74 (BP Location: Left Arm, Patient Position: Sitting, Cuff Size: Large)   Pulse 88   Resp 12   Ht 5\' 2"  (1.575 m)   Wt 174 lb 1.9 oz (79 kg)   SpO2 97% Comment: room air  BMI 31.85 kg/m   Patient alert and oriented and in no cardiopulmonary distress.  HEENT: No facial asymmetry, EOMI,   oropharynx pink and moist.  Neck supple no JVD, no mass.  Chest: Clear to auscultation bilaterally.  CVS: S1, S2 no murmurs, no S3.Regular rate.  ABD: Soft non tender.   Ext: No edema  MS: Adequate ROM spine, shoulders, hips and knees.  Skin: Intact, no ulcerations or rash noted.  Psych: Good eye contact, normal affect. Memory intact not anxious or depressed appearing.  CNS: CN 2-12 intact, power,  normal throughout.no focal deficits noted.   Assessment & Plan  Preop general physical exam History negative for any coi plaint except joint pain and malodorous urine. Pre op labs  and CXR are excellent except for CCUA EKG in 2019 normal and she has no cardiac complaints, medically cleared. Needs urine sent for c/s to ensure no UTI Medications reviewed and advised and written which of her medications , which lead to increased bleeding risk need to be discontinued 1 week prior to surgery  Malodorous urine Symptomatic with abnormal CCUA, urine c/s will be re sent and if positive she is to be treated prior to her surgery  NICOTINE ADDICTION Asked:confirms currently smokes 5 to 7 cigarettes/ day Assess: Needs  to quit , has knee surgery in the next 1 weekAdvise: needs to QUIT to reduce risk of cancer, cardio and cerebrovascular disease Assist: counseled for 5 minutes and literature provided Arrange: follow up in 3 months

## 2018-02-08 ENCOUNTER — Other Ambulatory Visit (HOSPITAL_COMMUNITY)
Admission: RE | Admit: 2018-02-08 | Discharge: 2018-02-08 | Disposition: A | Discharge status: Home / Self Care | Payer: 59 | Source: Ambulatory Visit | Attending: Family Medicine | Admitting: Family Medicine

## 2018-02-08 DIAGNOSIS — R8290 Unspecified abnormal findings in urine: Secondary | ICD-10-CM | POA: Diagnosis not present

## 2018-02-09 ENCOUNTER — Encounter: Payer: Self-pay | Admitting: Family Medicine

## 2018-02-09 LAB — URINE CULTURE: Culture: NO GROWTH

## 2018-02-10 ENCOUNTER — Other Ambulatory Visit: Payer: Self-pay

## 2018-02-10 ENCOUNTER — Encounter (HOSPITAL_COMMUNITY): Payer: Self-pay

## 2018-02-10 ENCOUNTER — Encounter (HOSPITAL_COMMUNITY)
Admission: RE | Admit: 2018-02-10 | Discharge: 2018-02-10 | Disposition: A | Discharge status: Home / Self Care | Payer: 59 | Source: Ambulatory Visit | Attending: Orthopedic Surgery | Admitting: Orthopedic Surgery

## 2018-02-10 DIAGNOSIS — Z01812 Encounter for preprocedural laboratory examination: Secondary | ICD-10-CM | POA: Insufficient documentation

## 2018-02-10 LAB — CBC
HCT: 41.3 % (ref 36.0–46.0)
HEMOGLOBIN: 13.7 g/dL (ref 12.0–15.0)
MCH: 31.8 pg (ref 26.0–34.0)
MCHC: 33.2 g/dL (ref 30.0–36.0)
MCV: 95.8 fL (ref 80.0–100.0)
NRBC: 0 % (ref 0.0–0.2)
Platelets: 206 10*3/uL (ref 150–400)
RBC: 4.31 MIL/uL (ref 3.87–5.11)
RDW: 13.7 % (ref 11.5–15.5)
WBC: 11.2 10*3/uL — ABNORMAL HIGH (ref 4.0–10.5)

## 2018-02-10 LAB — HCG, SERUM, QUALITATIVE: Preg, Serum: NEGATIVE

## 2018-02-10 LAB — ABO/RH: ABO/RH(D): A POS

## 2018-02-10 LAB — SURGICAL PCR SCREEN
MRSA, PCR: NEGATIVE
Staphylococcus aureus: POSITIVE — AB

## 2018-02-10 NOTE — Patient Instructions (Signed)
Zoe Bailey  02/10/2018   Your procedure is scheduled on: 02-17-18   Report to [facility] Main  Entrance    Report to admitting at 6:15AM    Call this number if you have problems the morning of surgery [phone]     Remember: Do not eat food or drink liquids :After Midnight. BRUSH YOUR TEETH MORNING OF SURGERY AND RINSE YOUR MOUTH OUT, NO CHEWING GUM CANDY OR MINTS.     Take these medicines the morning of surgery with A SIP OF WATER: WELLBUTRIN, BUSPAR IF NEEDED, GABAPENTIN, ZANTAC IF NEEDED, SPIRIVA INHALER IF NEEDED                                 You may not have any metal on your body including hair pins and              piercings  Do not wear jewelry, make-up, lotions, powders or perfumes, deodorant             Do not wear nail polish.  Do not shave  48 hours prior to surgery.                 Do not bring valuables to the hospital. [place].  Contacts, dentures or bridgework may not be worn into surgery.  Leave suitcase in the car. After surgery it may be brought to your room.                Please read over the following fact sheets you were given: _____________________________________________________________________  [facility] - Preparing for Surgery Before surgery, you can play an important role.  Because skin is not sterile, your skin needs to be as free of germs as possible.  You can reduce the number of germs on your skin by washing with CHG (chlorahexidine gluconate) soap before surgery.  CHG is an antiseptic cleaner which kills germs and bonds with the skin to continue killing germs even after washing. Please DO NOT use if you have an allergy to CHG or antibacterial soaps.  If your skin becomes reddened/irritated stop using the CHG and inform your nurse when you arrive at Short Stay. Do not shave (including legs and underarms) for at least 48 hours prior to the first CHG shower.  You may  shave your face/neck. Please follow these instructions carefully:  1.  Shower with CHG Soap the night before surgery and the  morning of Surgery.  2.  If you choose to wash your hair, wash your hair first as usual with your  normal  shampoo.  3.  After you shampoo, rinse your hair and body thoroughly to remove the  shampoo.                           4.  Use CHG as you would any other liquid soap.  You can apply chg directly  to the skin and wash                       Gently with a scrungie or clean washcloth.  5.  Apply the CHG Soap to your body ONLY FROM THE NECK DOWN.  Do not use on face/ open                           Wound or open sores. Avoid contact with eyes, ears mouth and genitals (private parts).                       Wash face,  Genitals (private parts) with your normal soap.             6.  Wash thoroughly, paying special attention to the area where your surgery  will be performed.  7.  Thoroughly rinse your body with warm water from the neck down.  8.  DO NOT shower/wash with your normal soap after using and rinsing off  the CHG Soap.                9.  Pat yourself dry with a clean towel.            10.  Wear clean pajamas.            11.  Place clean sheets on your bed the night of your first shower and do not  sleep with pets. Day of Surgery : Do not apply any lotions/deodorants the morning of surgery.  Please wear clean clothes to the hospital/surgery center.  FAILURE TO FOLLOW THESE INSTRUCTIONS MAY RESULT IN THE CANCELLATION OF YOUR SURGERY PATIENT SIGNATURE_________________________________  NURSE SIGNATURE__________________________________  ________________________________________________________________________   Adam Phenix  An incentive spirometer is a tool that can help keep your lungs clear and active. This tool measures how well you are filling your lungs with each breath. Taking long deep breaths may help reverse or decrease the chance of developing  breathing (pulmonary) problems (especially infection) following:  A long period of time when you are unable to move or be active. BEFORE THE PROCEDURE   If the spirometer includes an indicator to show your best effort, your nurse or respiratory therapist will set it to a desired goal.  If possible, sit up straight or lean slightly forward. Try not to slouch.  Hold the incentive spirometer in an upright position. INSTRUCTIONS FOR USE  1. Sit on the edge of your bed if possible, or sit up as far as you can in bed or on a chair. 2. Hold the incentive spirometer in an upright position. 3. Breathe out normally. 4. Place the mouthpiece in your mouth and seal your lips tightly around it. 5. Breathe in slowly and as deeply as possible, raising the piston or the ball toward the top of the column. 6. Hold your breath for 3-5 seconds or for as long as possible. Allow the piston or ball to fall to the bottom of the column. 7. Remove the mouthpiece from your mouth and breathe out normally. 8. Rest for a few seconds and repeat Steps 1 through 7 at least 10 times every 1-2 hours when you are awake. Take your time and take a few normal breaths between deep breaths. 9. The spirometer may include an indicator to show your best effort. Use the indicator as a goal to work toward during each repetition. 10. After each set of 10 deep breaths, practice coughing to be sure your lungs are clear. If you have an incision (the cut made at the time of surgery), support your incision when coughing by placing a pillow or rolled up towels firmly against it. Once you are able to get out of  bed, walk around indoors and cough well. You may stop using the incentive spirometer when instructed by your caregiver.  RISKS AND COMPLICATIONS  Take your time so you do not get dizzy or light-headed.  If you are in pain, you may need to take or ask for pain medication before doing incentive spirometry. It is harder to take a deep  breath if you are having pain. AFTER USE  Rest and breathe slowly and easily.  It can be helpful to keep track of a log of your progress. Your caregiver can provide you with a simple table to help with this. If you are using the spirometer at home, follow these instructions: [place] IF:   You are having difficultly using the spirometer.  You have trouble using the spirometer as often as instructed.  Your pain medication is not giving enough relief while using the spirometer.  You develop fever of 100.5 F (38.1 C) or higher. SEEK IMMEDIATE MEDICAL CARE IF:   You cough up bloody sputum that had not been present before.  You develop fever of 102 F (38.9 C) or greater.  You develop worsening pain at or near the incision site. MAKE SURE YOU:   Understand these instructions.  Will watch your condition.  Will get help right away if you are not doing well or get worse. Document Released: 07/06/2006 Document Revised: 05/18/2011 Document Reviewed: 09/06/2006 ExitCare Patient Information 2014 [employer], [place].   ________________________________________________________________________  WHAT IS A BLOOD TRANSFUSION? Blood Transfusion Information  A transfusion is the replacement of blood or some of its parts. Blood is made up of multiple cells which provide different functions.  Red blood cells carry oxygen and are used for blood loss replacement.  White blood cells fight against infection.  Platelets control bleeding.  Plasma helps clot blood.  Other blood products are available for specialized needs, such as hemophilia or other clotting disorders. BEFORE THE TRANSFUSION  Who gives blood for transfusions?   Healthy volunteers who are fully evaluated to make sure their blood is safe. This is blood bank blood. Transfusion therapy is the safest it has ever been in the practice of medicine. Before blood is taken from a donor, a complete history is taken to make sure  that person has no history of diseases nor engages in risky social behavior (examples are intravenous drug use or sexual activity with multiple partners). The donor's travel history is screened to minimize risk of transmitting infections, such as malaria. The donated blood is tested for signs of infectious diseases, such as HIV and hepatitis. The blood is then tested to be sure it is compatible with you in order to minimize the chance of a transfusion reaction. If you or a relative donates blood, this is often done in anticipation of surgery and is not appropriate for emergency situations. It takes many days to process the donated blood. RISKS AND COMPLICATIONS Although transfusion therapy is very safe and saves many lives, the main dangers of transfusion include:   Getting an infectious disease.  Developing a transfusion reaction. This is an allergic reaction to something in the blood you were given. Every precaution is taken to prevent this. The decision to have a blood transfusion has been considered carefully by your caregiver before blood is given. Blood is not given unless the benefits outweigh the risks. AFTER THE TRANSFUSION  Right after receiving a blood transfusion, you will usually feel much better and more energetic. This is especially true if your red blood  cells have gotten low (anemic). The transfusion raises the level of the red blood cells which carry oxygen, and this usually causes an energy increase.  The nurse administering the transfusion will monitor you carefully for complications. HOME CARE INSTRUCTIONS  No special instructions are needed after a transfusion. You may find your energy is better. Speak with your caregiver about any limitations on activity for underlying diseases you may have. SEEK MEDICAL CARE IF:   Your condition is not improving after your transfusion.  You develop redness or irritation at the intravenous (IV) site. SEEK IMMEDIATE MEDICAL CARE IF:  Any of  the following symptoms occur over the next 12 hours:  Shaking chills.  You have a temperature by mouth above 102 F (38.9 C), not controlled by medicine.  Chest, back, or muscle pain.  People around you feel you are not acting correctly or are confused.  Shortness of breath or difficulty breathing.  Dizziness and fainting.  You get a rash or develop hives.  You have a decrease in urine output.  Your urine turns a dark color or changes to pink, red, or brown. Any of the following symptoms occur over the next 10 days:  You have a temperature by mouth above 102 F (38.9 C), not controlled by medicine.  Shortness of breath.  Weakness after normal activity.  The white part of the eye turns yellow (jaundice).  You have a decrease in the amount of urine or are urinating less often.  Your urine turns a dark color or changes to pink, red, or brown. Document Released: 02/21/2000 Document Revised: 05/18/2011 Document Reviewed: 10/10/2007 [facility] Patient Information 2014 [place], [place].  _______________________________________________________________________

## 2018-02-11 MED FILL — MUPIROCIN 2% OINTMENT: 2 | 5 days supply | Qty: 22 | Fill #0

## 2018-02-14 ENCOUNTER — Encounter (HOSPITAL_COMMUNITY): Payer: Self-pay

## 2018-02-14 NOTE — Progress Notes (Signed)
CLEARANCE , DR. MARGARET SIMPSON 02-09-18 ON CHART   LOV DR. MARGARET SIMPSON 10-12-17 ON CHART

## 2018-02-16 NOTE — Anesthesia Preprocedure Evaluation (Addendum)
Anesthesia Evaluation  Patient identified by MRN, date of birth, ID band Patient awake    Reviewed: Allergy & Precautions, NPO status , Patient's Chart, lab work & pertinent test results  History of Anesthesia Complications (+) AWARENESS UNDER ANESTHESIA  Airway Mallampati: II  TM Distance: >3 FB Neck ROM: Full    Dental  (+) Dental Advisory Given   Pulmonary sleep apnea , COPD, Current Smoker,    breath sounds clear to auscultation       Cardiovascular hypertension, Pt. on medications  Rhythm:Regular Rate:Normal     Neuro/Psych  Headaches, Anxiety Depression    GI/Hepatic negative GI ROS, Neg liver ROS,   Endo/Other  negative endocrine ROS  Renal/GU negative Renal ROS     Musculoskeletal  (+) Arthritis ,   Abdominal   Peds  Hematology negative hematology ROS (+)   Anesthesia Other Findings   Reproductive/Obstetrics                            Lab Results  Component Value Date   WBC 11.2 (H) 02/10/2018   HGB 13.7 02/10/2018   HCT 41.3 02/10/2018   MCV 95.8 02/10/2018   PLT 206 02/10/2018   Lab Results  Component Value Date   CREATININE 0.86 01/26/2018   BUN 14 01/26/2018   NA 139 01/26/2018   K 4.8 01/26/2018   CL 103 01/26/2018   CO2 29 01/26/2018    Anesthesia Physical Anesthesia Plan  ASA: II  Anesthesia Plan: General   Post-op Pain Management:    Induction: Intravenous  PONV Risk Score and Plan: 2 and Ondansetron, Dexamethasone and Treatment may vary due to age or medical condition  Airway Management Planned: LMA  Additional Equipment:   Intra-op Plan:   Post-operative Plan: Extubation in OR  Informed Consent: I have reviewed the patients History and Physical, chart, labs and discussed the procedure including the risks, benefits and alternatives for the proposed anesthesia with the patient or authorized representative who has indicated his/her understanding  and acceptance.   Dental advisory given  Plan Discussed with: CRNA  Anesthesia Plan Comments:        Anesthesia Quick Evaluation

## 2018-02-17 ENCOUNTER — Ambulatory Visit (HOSPITAL_COMMUNITY): Payer: 59 | Admitting: Anesthesiology

## 2018-02-17 ENCOUNTER — Encounter (HOSPITAL_COMMUNITY)
Admission: RE | Disposition: A | Discharge status: Home / Self Care | Payer: Self-pay | Source: Ambulatory Visit | Attending: Orthopedic Surgery

## 2018-02-17 ENCOUNTER — Ambulatory Visit (HOSPITAL_COMMUNITY)
Admission: RE | Admit: 2018-02-17 | Discharge: 2018-02-17 | Disposition: A | Discharge status: Home / Self Care | Payer: 59 | Source: Ambulatory Visit | Attending: Orthopedic Surgery | Admitting: Orthopedic Surgery

## 2018-02-17 ENCOUNTER — Encounter (HOSPITAL_COMMUNITY): Payer: Self-pay

## 2018-02-17 DIAGNOSIS — Z791 Long term (current) use of non-steroidal anti-inflammatories (NSAID): Secondary | ICD-10-CM | POA: Insufficient documentation

## 2018-02-17 DIAGNOSIS — J449 Chronic obstructive pulmonary disease, unspecified: Secondary | ICD-10-CM | POA: Diagnosis not present

## 2018-02-17 DIAGNOSIS — M23221 Derangement of posterior horn of medial meniscus due to old tear or injury, right knee: Secondary | ICD-10-CM | POA: Insufficient documentation

## 2018-02-17 DIAGNOSIS — F418 Other specified anxiety disorders: Secondary | ICD-10-CM | POA: Insufficient documentation

## 2018-02-17 DIAGNOSIS — S83231A Complex tear of medial meniscus, current injury, right knee, initial encounter: Secondary | ICD-10-CM | POA: Diagnosis not present

## 2018-02-17 DIAGNOSIS — Z79899 Other long term (current) drug therapy: Secondary | ICD-10-CM | POA: Insufficient documentation

## 2018-02-17 DIAGNOSIS — M23231 Derangement of other medial meniscus due to old tear or injury, right knee: Secondary | ICD-10-CM | POA: Diagnosis not present

## 2018-02-17 DIAGNOSIS — M238X1 Other internal derangements of right knee: Secondary | ICD-10-CM | POA: Diagnosis not present

## 2018-02-17 DIAGNOSIS — I10 Essential (primary) hypertension: Secondary | ICD-10-CM | POA: Insufficient documentation

## 2018-02-17 DIAGNOSIS — M2241 Chondromalacia patellae, right knee: Secondary | ICD-10-CM | POA: Diagnosis not present

## 2018-02-17 DIAGNOSIS — F17200 Nicotine dependence, unspecified, uncomplicated: Secondary | ICD-10-CM | POA: Insufficient documentation

## 2018-02-17 DIAGNOSIS — M94261 Chondromalacia, right knee: Secondary | ICD-10-CM | POA: Diagnosis not present

## 2018-02-17 HISTORY — PX: KNEE ARTHROSCOPY WITH MEDIAL MENISECTOMY: SHX5651

## 2018-02-17 HISTORY — PX: CHONDROPLASTY: SHX5177

## 2018-02-17 LAB — TYPE AND SCREEN
ABO/RH(D): A POS
Antibody Screen: NEGATIVE

## 2018-02-17 SURGERY — ARTHROSCOPY, KNEE, WITH MEDIAL MENISCECTOMY
Anesthesia: General | Site: Knee | Laterality: Right

## 2018-02-17 MED ORDER — BUPIVACAINE-EPINEPHRINE 0.25% -1:200000 IJ SOLN
INTRAMUSCULAR | Status: DC | PRN
  Administered 2018-02-17: 50 mL

## 2018-02-17 MED ORDER — DEXAMETHASONE SODIUM PHOSPHATE 10 MG/ML IJ SOLN
10.0000 mg | Freq: Once | INTRAMUSCULAR | Status: AC
  Administered 2018-02-17: 10 mg via INTRAVENOUS

## 2018-02-17 MED ORDER — BUPIVACAINE-EPINEPHRINE (PF) 0.25% -1:200000 IJ SOLN
INTRAMUSCULAR | Status: AC
  Filled 2018-02-17: qty 30

## 2018-02-17 MED ORDER — LIDOCAINE-EPINEPHRINE 1 %-1:100000 IJ SOLN
INTRAMUSCULAR | Status: AC
  Filled 2018-02-17: qty 1

## 2018-02-17 MED ORDER — LACTATED RINGERS IV SOLN
INTRAVENOUS | Status: DC
  Administered 2018-02-17: 1000 mL via INTRAVENOUS

## 2018-02-17 MED ORDER — MIDAZOLAM HCL 5 MG/5ML IJ SOLN
INTRAMUSCULAR | Status: DC | PRN
  Administered 2018-02-17: 2 mg via INTRAVENOUS

## 2018-02-17 MED ORDER — HYDROCODONE-ACETAMINOPHEN 5-325 MG PO TABS: 1.0000 | ORAL_TABLET | Freq: Four times a day (QID) | ORAL | 0 refills | Status: DC | PRN

## 2018-02-17 MED ORDER — PHENYLEPHRINE 40 MCG/ML (10ML) SYRINGE FOR IV PUSH (FOR BLOOD PRESSURE SUPPORT)
PREFILLED_SYRINGE | INTRAVENOUS | Status: DC | PRN
  Administered 2018-02-17: 100 ug via INTRAVENOUS
  Administered 2018-02-17: 80 ug via INTRAVENOUS

## 2018-02-17 MED ORDER — PROPOFOL 10 MG/ML IV BOLUS
INTRAVENOUS | Status: DC | PRN
  Administered 2018-02-17: 180 mg via INTRAVENOUS

## 2018-02-17 MED ORDER — STERILE WATER FOR INJECTION IJ SOLN
INTRAMUSCULAR | Status: AC
  Filled 2018-02-17: qty 10

## 2018-02-17 MED ORDER — TRANEXAMIC ACID-NACL 1000-0.7 MG/100ML-% IV SOLN: 1000.0000 mg | INTRAVENOUS | Status: DC

## 2018-02-17 MED ORDER — MELOXICAM 15 MG PO TABS: 15.0000 mg | ORAL_TABLET | Freq: Two times a day (BID) | ORAL | 2 refills | Status: DC

## 2018-02-17 MED ORDER — MIDAZOLAM HCL 2 MG/2ML IJ SOLN
INTRAMUSCULAR | Status: AC
  Filled 2018-02-17: qty 2

## 2018-02-17 MED ORDER — ONDANSETRON HCL 4 MG/2ML IJ SOLN
INTRAMUSCULAR | Status: DC | PRN
  Administered 2018-02-17: 4 mg via INTRAVENOUS

## 2018-02-17 MED ORDER — LIDOCAINE-EPINEPHRINE 1 %-1:100000 IJ SOLN
INTRAMUSCULAR | Status: DC | PRN
  Administered 2018-02-17: 10 mL

## 2018-02-17 MED ORDER — ASPIRIN 81 MG PO CHEW: 81.0000 mg | CHEWABLE_TABLET | Freq: Two times a day (BID) | ORAL | 0 refills | Status: AC

## 2018-02-17 MED ORDER — FENTANYL CITRATE (PF) 100 MCG/2ML IJ SOLN
INTRAMUSCULAR | Status: AC
  Filled 2018-02-17: qty 2

## 2018-02-17 MED ORDER — SODIUM CHLORIDE (PF) 0.9 % IJ SOLN
INTRAMUSCULAR | Status: AC
  Filled 2018-02-17: qty 10

## 2018-02-17 MED ORDER — CHLORHEXIDINE GLUCONATE 4 % EX LIQD: 60.0000 mL | Freq: Once | CUTANEOUS | Status: DC

## 2018-02-17 MED ORDER — LACTATED RINGERS IR SOLN
Status: DC | PRN
  Administered 2018-02-17 (×2): 3000 mL

## 2018-02-17 MED ORDER — CEFAZOLIN SODIUM-DEXTROSE 2-4 GM/100ML-% IV SOLN
2.0000 g | INTRAVENOUS | Status: AC
  Administered 2018-02-17: 2 g via INTRAVENOUS
  Filled 2018-02-17: qty 100

## 2018-02-17 MED ORDER — FENTANYL CITRATE (PF) 100 MCG/2ML IJ SOLN
INTRAMUSCULAR | Status: DC | PRN
  Administered 2018-02-17 (×4): 25 ug via INTRAVENOUS

## 2018-02-17 MED ORDER — PROPOFOL 10 MG/ML IV BOLUS
INTRAVENOUS | Status: AC
  Filled 2018-02-17: qty 20

## 2018-02-17 MED FILL — MELOXICAM 15 MG TABLET: 15 | 53 days supply | Qty: 60 | Fill #0

## 2018-02-17 MED FILL — HYDROCODON-APAP 5-325: 5-325 | 5 days supply | Qty: 40 | Fill #0

## 2018-02-17 MED FILL — ASPIRIN CHILD 81 MG TAB CHE: 81 | 30 days supply | Qty: 60 | Fill #0

## 2018-02-17 SURGICAL SUPPLY — 35 items
BANDAGE ACE 6X5 VEL STRL LF (GAUZE/BANDAGES/DRESSINGS) ×3 IMPLANT
BLADE CUDA SHAVER 3.5 (BLADE) ×3 IMPLANT
BLADE SAW SGTL 11.0X1.19X90.0M (BLADE) IMPLANT
BLADE SURG SZ11 CARB STEEL (BLADE) ×3 IMPLANT
BNDG ELASTIC 6X10 VLCR STRL LF (GAUZE/BANDAGES/DRESSINGS) ×3 IMPLANT
COVER SURGICAL LIGHT HANDLE (MISCELLANEOUS) ×3 IMPLANT
COVER WAND RF STERILE (DRAPES) ×3 IMPLANT
DRAPE U-SHAPE 47X51 STRL (DRAPES) ×3 IMPLANT
DRESSING ADAPTIC 1/2  N-ADH (PACKING) ×3 IMPLANT
DRSG EMULSION OIL 3X3 NADH (GAUZE/BANDAGES/DRESSINGS) ×3 IMPLANT
DRSG PAD ABDOMINAL 8X10 ST (GAUZE/BANDAGES/DRESSINGS) ×3 IMPLANT
DURAPREP 26ML APPLICATOR (WOUND CARE) ×3 IMPLANT
GAUZE SPONGE 4X4 12PLY STRL (GAUZE/BANDAGES/DRESSINGS) ×3 IMPLANT
GLOVE BIOGEL M 7.0 STRL (GLOVE) IMPLANT
GLOVE BIOGEL PI IND STRL 7.5 (GLOVE) ×1 IMPLANT
GLOVE BIOGEL PI IND STRL 8.5 (GLOVE) ×1 IMPLANT
GLOVE BIOGEL PI INDICATOR 7.5 (GLOVE) ×2
GLOVE BIOGEL PI INDICATOR 8.5 (GLOVE) ×2
GLOVE ECLIPSE 8.0 STRL XLNG CF (GLOVE) IMPLANT
GLOVE ORTHO TXT STRL SZ7.5 (GLOVE) ×6 IMPLANT
GOWN STRL REUS W/TWL LRG LVL3 (GOWN DISPOSABLE) ×3 IMPLANT
GOWN STRL REUS W/TWL XL LVL3 (GOWN DISPOSABLE) ×6 IMPLANT
INSERT FOAM CUSHION (MISCELLANEOUS) ×3 IMPLANT
KIT BASIN OR (CUSTOM PROCEDURE TRAY) ×3 IMPLANT
MANIFOLD NEPTUNE II (INSTRUMENTS) ×6 IMPLANT
NEEDLE HYPO 22GX1.5 SAFETY (NEEDLE) ×3 IMPLANT
PACK ARTHROSCOPY WL (CUSTOM PROCEDURE TRAY) ×3 IMPLANT
PADDING CAST COTTON 6X4 STRL (CAST SUPPLIES) ×3 IMPLANT
PROTECTOR NERVE ULNAR (MISCELLANEOUS) ×3 IMPLANT
SUT ETHILON 4 0 PS 2 18 (SUTURE) ×3 IMPLANT
TOWEL OR 17X26 10 PK STRL BLUE (TOWEL DISPOSABLE) ×6 IMPLANT
TUBING ARTHRO INFLOW-ONLY STRL (TUBING) ×3 IMPLANT
TUBING CONNECTING 10 (TUBING) ×2 IMPLANT
TUBING CONNECTING 10' (TUBING) ×1
WRAP KNEE MAXI GEL POST OP (GAUZE/BANDAGES/DRESSINGS) ×3 IMPLANT

## 2018-02-17 NOTE — Brief Op Note (Signed)
02/17/2018  9:41 AM  PATIENT:  Zoe Bailey  52 y.o. female  PRE-OPERATIVE DIAGNOSIS:  Right knee complex medial meniscus tear, medial and lateral chondromalacia  POST-OPERATIVE DIAGNOSIS:  1. Right knee complex medial meniscus tear, 2.  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 Grade II-III chondromalacia patella/trochlea, 4.  1-1.5 cm chrondral defect medial aspect of lateral femoral condyle  PROCEDURE:  Procedure(s): KNEE ARTHROSCOPY WITH MEDIAL MENISECTOMY (Right) CHONDROPLASTY (Right), all three compartments  SURGEON:  Surgeon(s) and Role:    Paralee Cancel, MD - Primary  PHYSICIAN ASSISTANT: None  ASSISTANTS:  Surgical team  ANESTHESIA:   general  EBL:  5 mL   BLOOD ADMINISTERED:none  DRAINS: none   LOCAL MEDICATIONS USED:  MARCAINE     SPECIMEN:  No Specimen  DISPOSITION OF SPECIMEN:  N/A  COUNTS:  YES  TOURNIQUET:  * No tourniquets in log *  DICTATION: .Other Dictation: Dictation Number [id number]  PLAN OF CARE: Discharge to home after PACU  PATIENT DISPOSITION:  PACU - hemodynamically stable.   Delay start of Pharmacological VTE agent (>24hrs) due to surgical blood loss or risk of bleeding: no

## 2018-02-17 NOTE — H&P (Signed)
CC- Zoe Bailey is a 52 y.o. female who presents with right knee pain.  HPI- . Knee Pain: Patient presents for follow up on a knee problem involving the  right knee. Onset of the symptoms was several months ago. Inciting event: none known. Current symptoms include giving out, pain located medial and anterior, popping sensation and swelling. Pain is aggravated by any weight bearing, kneeling, lateral movements, pivoting and walking.  Patient has had no prior knee problems. Evaluation to date: plain films: normal and MRI: abnormal with complex medial mensicus tear and medial and patellofemoral chondromalacia. Treatment to date: avoidance of offending activity, corticosteroid injection which was not very effective, OTC analgesics which are ineffective and prescription NSAIDS which are not very effective.  Past Medical History:  Diagnosis Date  . Arthritis   . Cancer ([place]) 2010 approx   skin cancer  . Complication of anesthesia    Wakes up during all surgeries  . COPD (chronic obstructive pulmonary disease) ([place])   . Depression   . Foot pain, right 03/2014  . Migraine    USES BOTOX FOR TREATMENT   . Shortness of breath dyspnea   . Sleep apnea   . Toenail fungus     Past Surgical History:  Procedure Laterality Date  . ANKLE FRACTURE SURGERY Right   . BREAST ENHANCEMENT SURGERY    . CARPAL TUNNEL RELEASE Right    x2  . COLONOSCOPY WITH PROPOFOL N/A 05/07/2017   Procedure: COLONOSCOPY WITH PROPOFOL;  Surgeon: Rogene Houston, MD;  Location: [facility] SUITE;  Service: Endoscopy;  Laterality: N/A;  8:30  . HERNIA REPAIR     umbilical  . NASAL POLYP SURGERY    . POLYPECTOMY      Prior to Admission medications   Medication Sig Start Date End Date Taking? Authorizing Provider  buPROPion (WELLBUTRIN XL) 300 MG 24 hr tablet Take 1 tablet (300 mg total) by mouth daily. 04/12/17  Yes Fayrene Helper, MD  busPIRone (BUSPAR) 10 MG tablet Take 1 tablet (10 mg total) by mouth 3 (three) times  daily. Patient taking differently: Take 10 mg by mouth daily as needed (anxiety).  08/11/17  Yes Fayrene Helper, MD  cyclobenzaprine (FLEXERIL) 10 MG tablet TAKE 1 TABLET BY MOUTH 3 TIMES DAILY AS NEEDED FOR MUSCLE SPASMS. Patient taking differently: Take 10 mg by mouth daily as needed for muscle spasms.  07/23/17  Yes Fayrene Helper, MD  diclofenac (VOLTAREN) 75 MG EC tablet TAKE 1 TABLET BY MOUTH ONCE DAILY FOR GENERALIZED JOINT PAIN Patient taking differently: Take 75 mg by mouth daily.  09/30/17  Yes Fayrene Helper, MD  gabapentin (NEURONTIN) 400 MG capsule TAKE 1 CAPSULE BY MOUTH 3 TIMES DAILY Patient taking differently: Take 400 mg by mouth daily.  06/16/17  Yes Fayrene Helper, MD  Multiple Vitamin (MULTIVITAMIN WITH MINERALS) TABS tablet Take 2 tablets by mouth daily.    Yes [provider]  Multiple Vitamins-Minerals (HAIR SKIN AND NAILS FORMULA) TABS Take 1 tablet by mouth 2 (two) times daily.   Yes [provider]  OnabotulinumtoxinA (BOTOX IM) Inject 1 Dose into the muscle every 3 (three) months.   Yes [provider]  ranitidine (ZANTAC) 75 MG tablet Take 75 mg by mouth daily as needed for heartburn.   Yes [provider]  tiotropium (SPIRIVA) 18 MCG inhalation capsule Place 1 capsule (18 mcg total) into inhaler and inhale daily. Patient taking differently: Place 18 mcg into inhaler and inhale daily  as needed (shortness of breath).  06/25/15  Yes Fayrene Helper, MD  triamterene-hydrochlorothiazide (MAXZIDE) 75-50 MG tablet TAKE 1 TABLET BY MOUTH ONCE DAILY 09/13/17  Yes Fayrene Helper, MD    soft tissue tenderness over medial and anterior aspect of knee, effusion, reduced range of motion, collateral ligaments intact, normal ipsilateral foot and ankle exam, normal contralateral knee exam  Physical Examination: General appearance - alert, well appearing, and in no distress Mental status - alert, oriented to person, place, and  time Chest - no tachypnea, retractions or cyanosis Heart - normal rate and regular rhythm Abdomen - soft, nontender, nondistended, no masses or organomegaly Neurological - alert, oriented, normal speech, no focal findings or movement disorder noted Musculoskeletal - see above right knee exam Extremities - peripheral pulses normal, no pedal edema, no clubbing or cyanosis Skin - normal coloration and turgor, no rashes, no suspicious skin lesions noted   Assessment:  Right knee complex medial meniscus tear and medial and patellofemoral chondromalacia  Plan:  Right knee arthroscopy with medial meniscectomy, medial and PF chondroplasty. Procedure risks and potential comps discussed with patient who elects to proceed. Goals are decreased pain and increased function with a high likelihood of achieving both

## 2018-02-17 NOTE — Transfer of Care (Signed)
Immediate Anesthesia Transfer of Care Note  Patient: Zoe Bailey  Procedure(s) Performed: Procedure(s): KNEE ARTHROSCOPY WITH MEDIAL MENISECTOMY (Right) CHONDROPLASTY (Right)  Patient Location: PACU  Anesthesia Type:General  Level of Consciousness:  sedated, patient cooperative and responds to stimulation  Airway & Oxygen Therapy:Patient Spontanous Breathing and Patient connected to face mask oxgen  Post-op Assessment:  Report given to PACU RN and Post -op Vital signs reviewed and stable  Post vital signs:  Reviewed and stable  Last Vitals:  Vitals:   02/17/18 0629  BP: 137/81  Pulse: 76  Resp: 16  Temp: 37.2 C  SpO2: 47%    Complications: No apparent anesthesia complications

## 2018-02-17 NOTE — Op Note (Signed)
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  POSTOPERATIVE DIAGNOSES AND FINDINGS: 1.  Complex tear to the posterior horn to mid body of the medial meniscus. 2.  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 3.  Grade II to III chondromalacia diffusely on the lateral facet of the patella and the associated trochlea. 4.  Grade III-IV chondral defect of the medial aspect of the lateral femoral condyle.  PROCEDURE:   1.  Right knee diagnostic and operative arthroscopy with medial partial to subtotal meniscectomy from the posterior horn to the mid body. 2.  Medial, patellofemoral and lateral compartment chondroplasty debridement of unstable flaps of cartilage.  No microfracture abrasion chondroplasty carried out due to some cartilage on the bone surfaces.  SURGEON:  Paralee Cancel, MD  ASSISTANT:  Surgical team.  ANESTHESIA:  General.  SPECIMENS:  None.  COMPLICATIONS:  None.  ESTIMATED BLOOD LOSS:  Minimal.  INDICATIONS:  The patient is a 52 year old female who initially presented to the office for consideration of partial knee arthroplasty based on MRI findings.  I had a lengthy discussion with her regarding the complexity of the medial meniscal tear and  the potential benefit of arthroscopic surgery for joint preservation for a bit longer despite degenerative changes identified by MRI.  Given this and the fact she had persistent recurrent problems of the knee, she wished to proceed with a procedure.  The  risks of infection and DVT discussed with being minimal but the potential consequence of risk of persistent pain related to progressive  arthritis discussed, risk of recurrent meniscal pathology review as opposed to the pros and cons of partial versus  total knee arthroplasty.  Based on our discussion in the office, the plan was to proceed with arthroscopic surgery.  Consent was obtained.  DESCRIPTION OF PROCEDURE:  The patient was brought to the operative theater.  Once adequate anesthesia, preoperative antibiotics, Ancef administered, she was positioned supine with the right leg in a leg holder.  The right lower extremity was then  prepped and draped in sterile fashion.  Timeout was performed identifying the patient, the planned procedure and extremity.  Standard inferior medial, inferior lateral and superior lateral portals were utilized.  Diagnostic evaluation of the knee  revealed initially a very complex tear to the posterior aspect of the medial meniscus as well as diffuse grade III changes of the medial and patellofemoral compartments.  In the medial compartment through the inferomedial portal, we utilized the  combination of straight biting basket followed by the 3.5 coude shaver to debride the meniscus back to a stable level.  The complexity of the tear was quite evident throughout this procedure all the way to the periphery posteriorly and then blending this  into the anterior horn mid body region.  In addition, the 3.5 coude shaver was used to debride the chondral defect and chondral flaps back to a stable level.  Once I was satisfied with the stability of the medial compartment, we evaluated and found intact anterior cruciate ligament.  I then  went to the lateral compartment and identified a chondral defect with fissuring on the medial aspect of the distal lateral femoral condyle.  I used a 3.5 coude shaver to debride this back to a  stable level.  At the bone surface there was evidence of a  thin layer of cartilage.  This was kept intact.  Once I was satisfied with the medial and lateral tibiofemoral joint spaces, we  addressed anteriorly.  Again, using the 3.5 coude shaver a synovectomy was carried out for exposure purposes as well as  removing a small plica shelf.  I used a 3.5 coude shaver to debride the chondral surfaces on the undersurface of the lateral facet and over the lateral trochlea.  There was no evidence of eburnated bone.  No need for microfracture abrasion chondroplasty.   Following the procedures, I reexamined the knee to make certain that there were no loose fragments of cartilage floating in the knee.  We also assessed the stability of the remaining articular and meniscal cartilage surface.  Once this was done, the  instrumentation was removed from the knee.  The portal sites were reapproximated using a 4-0 nylon.  At the end of the case, I injected the case with 0.25% Marcaine.  The knee was then dressed into a sterile bulky Jones dressing.  She was brought to the  recovery room in stable condition, tolerating the procedure well.  Findings were reviewed with the friend.  We will see her back in the office in 2 weeks to check on her wounds-.  Physical therapy can be discussed as well as long-term plans for  expectations.  TN/NUANCE  D:02/17/2018 T:02/17/2018 JOB:[id number]

## 2018-02-17 NOTE — Anesthesia Procedure Notes (Signed)
Procedure Name: LMA Insertion Date/Time: 02/17/2018 8:56 AM Performed by: Anne Fu, CRNA Pre-anesthesia Checklist: Patient identified, Emergency Drugs available, Suction available, Patient being monitored and Timeout performed Patient Re-evaluated:Patient Re-evaluated prior to induction Oxygen Delivery Method: Circle system utilized Preoxygenation: Pre-oxygenation with 100% oxygen Induction Type: IV induction Ventilation: Mask ventilation without difficulty LMA: LMA inserted LMA Size: 4.0 Number of attempts: 1 Placement Confirmation: positive ETCO2 and breath sounds checked- equal and bilateral Tube secured with: Tape

## 2018-02-17 NOTE — Progress Notes (Signed)
Orthopedic Tech at Bedside giving crutch walking instructions to patient and caregiver

## 2018-02-17 NOTE — Anesthesia Postprocedure Evaluation (Signed)
Anesthesia Post Note  Patient: Zoe Bailey  Procedure(s) Performed: KNEE ARTHROSCOPY WITH MEDIAL MENISECTOMY (Right Knee) CHONDROPLASTY (Right Knee)     Patient location during evaluation: PACU Anesthesia Type: General Level of consciousness: awake and alert Pain management: pain level controlled Vital Signs Assessment: post-procedure vital signs reviewed and stable Respiratory status: spontaneous breathing, nonlabored ventilation, respiratory function stable and patient connected to nasal cannula oxygen Cardiovascular status: blood pressure returned to baseline and stable Postop Assessment: no apparent nausea or vomiting Anesthetic complications: no    Last Vitals:  Vitals:   02/17/18 1021 02/17/18 1115  BP: (!) 157/87 (!) 145/84  Pulse:    Resp: 16 16  Temp: (!) 36.4 C 36.4 C  SpO2: 97% 97%    Last Pain:  Vitals:   02/17/18 1115  TempSrc:   PainSc: 0-No pain                 Tiajuana Amass

## 2018-02-18 ENCOUNTER — Encounter (HOSPITAL_COMMUNITY): Payer: Self-pay | Admitting: Orthopedic Surgery

## 2018-02-20 ENCOUNTER — Encounter: Payer: Self-pay | Admitting: Family Medicine

## 2018-02-20 DIAGNOSIS — R8290 Unspecified abnormal findings in urine: Secondary | ICD-10-CM | POA: Insufficient documentation

## 2018-02-20 NOTE — Assessment & Plan Note (Signed)
Symptomatic with abnormal CCUA, urine c/s will be re sent and if positive she is to be treated prior to her surgery

## 2018-02-20 NOTE — Assessment & Plan Note (Signed)
Asked:confirms currently smokes 5 to 7 cigarettes/ day Assess: Needs  to quit , has knee surgery in the next 1 weekAdvise: needs to QUIT to reduce risk of cancer, cardio and cerebrovascular disease Assist: counseled for 5 minutes and literature provided Arrange: follow up in 3 months

## 2018-02-20 NOTE — Assessment & Plan Note (Addendum)
History negative for any coi plaint except joint pain and malodorous urine. Pre op labs and CXR are excellent except for CCUA EKG in 2019 normal and she has no cardiac complaints, medically cleared. Needs urine sent for c/s to ensure no UTI Medications reviewed and advised and written which of her medications , which lead to increased bleeding risk need to be discontinued 1 week prior to surgery

## 2018-03-07 DIAGNOSIS — M791 Myalgia, unspecified site: Secondary | ICD-10-CM | POA: Diagnosis not present

## 2018-03-07 DIAGNOSIS — R51 Headache: Secondary | ICD-10-CM | POA: Diagnosis not present

## 2018-03-07 DIAGNOSIS — M542 Cervicalgia: Secondary | ICD-10-CM | POA: Diagnosis not present

## 2018-03-07 DIAGNOSIS — G43719 Chronic migraine without aura, intractable, without status migrainosus: Secondary | ICD-10-CM | POA: Diagnosis not present

## 2018-04-07 MED FILL — MELOXICAM 15 MG TABLET: 15 | 60 days supply | Qty: 60 | Fill #1

## 2018-04-14 ENCOUNTER — Other Ambulatory Visit: Payer: Self-pay | Admitting: Family Medicine

## 2018-04-14 MED FILL — buPROPion HCL ER (XL) 300 M: 300 | 90 days supply | Qty: 90 | Fill #0

## 2018-04-28 ENCOUNTER — Other Ambulatory Visit: Payer: Self-pay | Admitting: Family Medicine

## 2018-04-28 MED FILL — CYCLOBENZAPRINE 10 MG TAB: 10 | 10 days supply | Qty: 30 | Fill #4

## 2018-05-05 ENCOUNTER — Telehealth: Payer: Self-pay

## 2018-05-05 ENCOUNTER — Other Ambulatory Visit: Payer: Self-pay | Admitting: Family Medicine

## 2018-05-05 ENCOUNTER — Encounter: Payer: Self-pay | Admitting: Family Medicine

## 2018-05-05 MED ORDER — DICLOFENAC SODIUM 75 MG PO TBEC: DELAYED_RELEASE_TABLET | ORAL | 2 refills | Status: DC

## 2018-05-05 MED FILL — DICLOFENAC SODIUM 75 MG TAB: 75 | 30 days supply | Qty: 30 | Fill #0 | Status: TO

## 2018-05-05 NOTE — Telephone Encounter (Signed)
Patient notified via mychart

## 2018-05-05 NOTE — Telephone Encounter (Signed)
Sent to [facility] out pt pharmacy

## 2018-05-18 DIAGNOSIS — M791 Myalgia, unspecified site: Secondary | ICD-10-CM | POA: Diagnosis not present

## 2018-05-18 DIAGNOSIS — G518 Other disorders of facial nerve: Secondary | ICD-10-CM | POA: Diagnosis not present

## 2018-05-18 DIAGNOSIS — M542 Cervicalgia: Secondary | ICD-10-CM | POA: Diagnosis not present

## 2018-05-18 DIAGNOSIS — G43719 Chronic migraine without aura, intractable, without status migrainosus: Secondary | ICD-10-CM | POA: Diagnosis not present

## 2018-05-18 MED FILL — BOTOX 100 UNITS VIAL: 100 | 28 days supply | Qty: 2 | Fill #3

## 2018-05-30 MED FILL — DICLOFENAC SODIUM 75 MG TAB: 75 | 30 days supply | Qty: 30 | Fill #0

## 2018-06-13 ENCOUNTER — Other Ambulatory Visit: Payer: Self-pay

## 2018-06-13 ENCOUNTER — Encounter: Payer: Self-pay | Admitting: Family Medicine

## 2018-06-13 ENCOUNTER — Ambulatory Visit (INDEPENDENT_AMBULATORY_CARE_PROVIDER_SITE_OTHER): Payer: 59 | Admitting: Family Medicine

## 2018-06-13 VITALS — BP 135/80 | Ht 62.0 in | Wt 175.0 lb

## 2018-06-13 DIAGNOSIS — F17210 Nicotine dependence, cigarettes, uncomplicated: Secondary | ICD-10-CM

## 2018-06-13 DIAGNOSIS — Z1231 Encounter for screening mammogram for malignant neoplasm of breast: Secondary | ICD-10-CM | POA: Diagnosis not present

## 2018-06-13 DIAGNOSIS — F418 Other specified anxiety disorders: Secondary | ICD-10-CM

## 2018-06-13 DIAGNOSIS — I10 Essential (primary) hypertension: Secondary | ICD-10-CM | POA: Diagnosis not present

## 2018-06-13 DIAGNOSIS — G43119 Migraine with aura, intractable, without status migrainosus: Secondary | ICD-10-CM | POA: Diagnosis not present

## 2018-06-13 DIAGNOSIS — E669 Obesity, unspecified: Secondary | ICD-10-CM | POA: Diagnosis not present

## 2018-06-13 DIAGNOSIS — F17200 Nicotine dependence, unspecified, uncomplicated: Secondary | ICD-10-CM

## 2018-06-13 MED ORDER — PANTOPRAZOLE SODIUM 20 MG PO TBEC: 20.0000 mg | DELAYED_RELEASE_TABLET | Freq: Every day | ORAL | 1 refills | Status: DC

## 2018-06-13 MED ORDER — BENZONATATE 100 MG PO CAPS: 100.0000 mg | ORAL_CAPSULE | Freq: Two times a day (BID) | ORAL | 1 refills | Status: DC | PRN

## 2018-06-13 MED ORDER — GABAPENTIN 400 MG PO CAPS: ORAL_CAPSULE | ORAL | 3 refills | Status: DC

## 2018-06-13 MED ORDER — TIOTROPIUM BROMIDE MONOHYDRATE 18 MCG IN CAPS: 18.0000 ug | ORAL_CAPSULE | Freq: Every day | RESPIRATORY_TRACT | 3 refills | Status: DC

## 2018-06-13 MED FILL — GABAPENTIN 400 MG CAPS: 400 | 90 days supply | Qty: 180 | Fill #0

## 2018-06-13 MED FILL — BENZONATATE 100 MG CAPS: 100 | 15 days supply | Qty: 30 | Fill #0

## 2018-06-13 MED FILL — PANTOPRAZOLE SOD DR 20 MG T: 20 | 90 days supply | Qty: 90 | Fill #0

## 2018-06-13 MED FILL — SPIRIVA 18 MCG CP-HANDIHALE: 18 | 30 days supply | Qty: 30 | Fill #0

## 2018-06-13 NOTE — Assessment & Plan Note (Signed)
Deteriorated.  Patient re-educated about  the importance of commitment to a  minimum of 150 minutes of exercise per week as able.  The importance of healthy food choices with portion control discussed, as well as eating regularly and within a 12 hour window most days. The need to choose "clean , green" food 50 to 75% of the time is discussed, as well as to make water the primary drink and set a goal of 64 ounces water daily.  Encouraged to start a food diary,  and to consider  joining a support group. Sample diet sheets offered. Goals set by the patient for the next several months.   Weight /BMI 06/13/2018 02/17/2018 02/10/2018  WEIGHT 175 lb 173 lb 170 lb  HEIGHT 5\' 2"  5\' 2"  5\' 2"   BMI 32.01 kg/m2 31.64 kg/m2 31.09 kg/m2

## 2018-06-13 NOTE — Assessment & Plan Note (Signed)
Asked:confirms currently smokes 7 cigarettes/ day Assess: Unwilling to set a quit date  but cutting back Advise: needs to QUIT to reduce risk of cancer, cardio and cerebrovascular disease Assist: counseled for 5 minutes and literature provided Arrange: follow up in 3 months

## 2018-06-13 NOTE — Assessment & Plan Note (Signed)
Controlled, no change in medication  

## 2018-06-13 NOTE — Patient Instructions (Addendum)
Annual physical exam mid to end July, shingrix #1 at that visit, call if you need me before  Screening mammogram to be scheduled for the Summer  Fasting lipid, chem 7 and EGFr and TSH 1 week before next visit  Please DO work on cutting back on smoking with a view to quitting in the next 4 to 6 months  Thankful that you are dong well overall pleae continue to take extra care of yourself.  It is important that you exercise regularly at least 30 minutes 5 times a week. If you develop chest pain, have severe difficulty breathing, or feel very tired, stop exercising immediately and seek medical attention  Think about what you will eat, plan ahead. Choose " clean, green, fresh or frozen" over canned, processed or packaged foods which are more sugary, salty and fatty. 70 to 75% of food eaten should be vegetables and fruit. Three meals at set times with snacks allowed between meals, but they must be fruit or vegetables. Aim to eat over a 12 hour period , example 7 am to 7 pm, and STOP after  your last meal of the day. Drink water,generally about 64 ounces per day, no other drink is as healthy. Fruit juice is best enjoyed in a healthy way, by EATING the fruit.   Thanks for choosing [facility], we consider it a privelige to serve you.  This information is directly available on the CDC website: RunningShows.co.za.html    Source:CDC Reference to specific commercial products, manufacturers, companies, or trademarks does not constitute its endorsement or recommendation by the [place], [place], or Centers for [employer] and Prevention.

## 2018-06-13 NOTE — Progress Notes (Signed)
Virtual Visit via Telephone Note  I connected with Zoe Bailey on 06/13/18 at  4:00 PM EDT by telephone and verified that I am speaking with the correct person using two identifiers.   I discussed the limitations, risks, security and privacy concerns of performing an evaluation and management service by telephone and the availability of in person appointments. I also discussed with the patient that there may be a patient responsible charge related to this service. The patient expressed understanding and agreed to proceed. I am in my home , and pt is at work in the hospital, no face to face contact available,  Though desired  History of Present Illness: F/u chronic problems and medication review Denies recent fever or chills. Denies sinus pressure, nasal congestion, ear pain or sore throat. Denies chest congestion, productive cough or wheezing. Denies chest pains, palpitations and leg swelling Denies abdominal pain, nausea, vomiting,diarrhea or constipation.   Denies dysuria, frequency, hesitancy or incontinence. C/o daily generalized  joint pain,  and limitation in mobility. Denies  Uncontrolled headaches, seizures, numbness, or tingling. Denies uncontrolled depression, anxiety or insomnia. Denies skin break down or rash.       Observations/Objective: BP 135/80 Comment: from record  Ht 5\' 2"  (1.575 m)   Wt 175 lb (79.4 kg)   BMI 32.01 kg/m    Assessment and Plan:  Depression with anxiety Controlled, no change in medication   Essential hypertension Controlled, no change in medication DASH diet and commitment to daily physical activity for a minimum of 30 minutes discussed and encouraged, as a part of hypertension management. The importance of attaining a healthy weight is also discussed.  BP/Weight 06/13/2018 02/17/2018 02/10/2018 02/07/2018 10/12/2017 11/11/6385 07/13/4330  Systolic BP 951 884 166 063 016 010 932  Diastolic BP 80 84 75 74 70 86 76  Wt. (Lbs) 175 173 170 174.12  166.08 168 -  BMI 32.01 31.64 31.09 31.85 30.38 30.73 -       Obesity (BMI 30.0-34.9) Deteriorated.  Patient re-educated about  the importance of commitment to a  minimum of 150 minutes of exercise per week as able.  The importance of healthy food choices with portion control discussed, as well as eating regularly and within a 12 hour window most days. The need to choose "clean , green" food 50 to 75% of the time is discussed, as well as to make water the primary drink and set a goal of 64 ounces water daily.  Encouraged to start a food diary,  and to consider  joining a support group. Sample diet sheets offered. Goals set by the patient for the next several months.   Weight /BMI 06/13/2018 02/17/2018 02/10/2018  WEIGHT 175 lb 173 lb 170 lb  HEIGHT 5\' 2"  5\' 2"  5\' 2"   BMI 32.01 kg/m2 31.64 kg/m2 31.09 kg/m2      NICOTINE ADDICTION Asked:confirms currently smokes 7 cigarettes/ day Assess: Unwilling to set a quit date  but cutting back Advise: needs to QUIT to reduce risk of cancer, cardio and cerebrovascular disease Assist: counseled for 5 minutes and literature provided Arrange: follow up in 3 months   Migraine Controlled with infrequent headaches on prophylactic gabapentin at current dose   Follow Up Instructions:    I discussed the assessment and treatment plan with the patient. The patient was provided an opportunity to ask questions and all were answered. The patient agreed with the plan and demonstrated an understanding of the instructions.   The patient was advised to call back  or seek an in-person evaluation if the symptoms worsen or if the condition fails to improve as anticipated.  I provided 25 minutes of non-face-to-face time during this encounter.   Tula Nakayama, MD

## 2018-06-13 NOTE — Assessment & Plan Note (Signed)
Controlled with infrequent headaches on prophylactic gabapentin at current dose

## 2018-06-13 NOTE — Assessment & Plan Note (Signed)
Controlled, no change in medication DASH diet and commitment to daily physical activity for a minimum of 30 minutes discussed and encouraged, as a part of hypertension management. The importance of attaining a healthy weight is also discussed.  BP/Weight 06/13/2018 02/17/2018 02/10/2018 02/07/2018 10/12/2017 09/13/338 05/12/2479  Systolic BP 859 093 112 162 446 950 722  Diastolic BP 80 84 75 74 70 86 76  Wt. (Lbs) 175 173 170 174.12 166.08 168 -  BMI 32.01 31.64 31.09 31.85 30.38 30.73 -

## 2018-06-14 ENCOUNTER — Encounter: Payer: Self-pay | Admitting: Family Medicine

## 2018-06-14 ENCOUNTER — Other Ambulatory Visit: Payer: Self-pay

## 2018-06-14 MED ORDER — CYCLOBENZAPRINE HCL 10 MG PO TABS: ORAL_TABLET | ORAL | 3 refills | Status: DC

## 2018-06-14 MED FILL — CYCLOBENZAPRINE HCL 10 MG T: 10 | 10 days supply | Qty: 30 | Fill #0

## 2018-06-22 ENCOUNTER — Telehealth: Payer: Self-pay

## 2018-06-22 NOTE — Telephone Encounter (Signed)
-----   Message from Fayrene Helper, MD sent at 06/13/2018  9:56 PM EDT ----- Regarding: pls needs lab orders also her tomo screen is  with breast implants, may ned to re enter, i could not find But I noted the implants, help pls when able, no mammo before Summer anyhow  TX!

## 2018-07-06 DIAGNOSIS — L570 Actinic keratosis: Secondary | ICD-10-CM | POA: Diagnosis not present

## 2018-07-06 DIAGNOSIS — X32XXXD Exposure to sunlight, subsequent encounter: Secondary | ICD-10-CM | POA: Diagnosis not present

## 2018-07-11 MED FILL — DICLOFENAC SODIUM 75 MG TAB: 75 | 30 days supply | Qty: 30 | Fill #1

## 2018-07-12 MED FILL — buPROPion HCL ER (XL) 300 M: 300 | 90 days supply | Qty: 90 | Fill #0

## 2018-07-19 NOTE — Addendum Note (Signed)
Addended by: Eual Fines on: 07/19/2018 03:42 PM   Modules accepted: Orders

## 2018-08-08 MED FILL — BOTOX 100 UNITS VIAL: 100 | 84 days supply | Qty: 2 | Fill #0

## 2018-08-12 ENCOUNTER — Encounter: Payer: Self-pay | Admitting: Family Medicine

## 2018-08-12 ENCOUNTER — Other Ambulatory Visit: Payer: Self-pay | Admitting: Family Medicine

## 2018-08-15 ENCOUNTER — Telehealth: Payer: Self-pay

## 2018-08-15 ENCOUNTER — Encounter: Payer: Self-pay | Admitting: Family Medicine

## 2018-08-15 ENCOUNTER — Telehealth: Payer: Self-pay | Admitting: Family Medicine

## 2018-08-15 DIAGNOSIS — I10 Essential (primary) hypertension: Secondary | ICD-10-CM

## 2018-08-15 DIAGNOSIS — E1169 Type 2 diabetes mellitus with other specified complication: Secondary | ICD-10-CM

## 2018-08-15 DIAGNOSIS — E669 Obesity, unspecified: Secondary | ICD-10-CM

## 2018-08-15 DIAGNOSIS — Z1321 Encounter for screening for nutritional disorder: Secondary | ICD-10-CM

## 2018-08-15 DIAGNOSIS — R7301 Impaired fasting glucose: Secondary | ICD-10-CM

## 2018-08-15 MED FILL — DICLOFENAC SODIUM 75 MG TAB: 75 | 30 days supply | Qty: 30 | Fill #0

## 2018-08-15 NOTE — Telephone Encounter (Signed)
Pharmacy called wanted rx refill approval for : diclofenac (VOLTAREN) 75 MG EC tablet [[id number]]    Call back # [place]

## 2018-08-15 NOTE — Telephone Encounter (Signed)
Labs ordered to be drawn prior to appt

## 2018-08-15 NOTE — Telephone Encounter (Signed)
Medication has been refilled.

## 2018-08-17 DIAGNOSIS — G518 Other disorders of facial nerve: Secondary | ICD-10-CM | POA: Diagnosis not present

## 2018-08-17 DIAGNOSIS — M542 Cervicalgia: Secondary | ICD-10-CM | POA: Diagnosis not present

## 2018-08-17 DIAGNOSIS — M791 Myalgia, unspecified site: Secondary | ICD-10-CM | POA: Diagnosis not present

## 2018-08-17 DIAGNOSIS — G43719 Chronic migraine without aura, intractable, without status migrainosus: Secondary | ICD-10-CM | POA: Diagnosis not present

## 2018-09-16 ENCOUNTER — Other Ambulatory Visit: Payer: Self-pay

## 2018-09-16 ENCOUNTER — Other Ambulatory Visit (HOSPITAL_COMMUNITY)
Admission: RE | Admit: 2018-09-16 | Discharge: 2018-09-16 | Disposition: A | Discharge status: Home / Self Care | Payer: 59 | Source: Ambulatory Visit | Attending: Family Medicine | Admitting: Family Medicine

## 2018-09-16 DIAGNOSIS — R7301 Impaired fasting glucose: Secondary | ICD-10-CM | POA: Insufficient documentation

## 2018-09-16 DIAGNOSIS — I10 Essential (primary) hypertension: Secondary | ICD-10-CM | POA: Diagnosis not present

## 2018-09-16 DIAGNOSIS — E669 Obesity, unspecified: Secondary | ICD-10-CM | POA: Diagnosis not present

## 2018-09-16 DIAGNOSIS — E1169 Type 2 diabetes mellitus with other specified complication: Secondary | ICD-10-CM | POA: Insufficient documentation

## 2018-09-16 LAB — CBC
HCT: 42.8 % (ref 36.0–46.0)
Hemoglobin: 14.1 g/dL (ref 12.0–15.0)
MCH: 30.8 pg (ref 26.0–34.0)
MCHC: 32.9 g/dL (ref 30.0–36.0)
MCV: 93.4 fL (ref 80.0–100.0)
Platelets: 219 10*3/uL (ref 150–400)
RBC: 4.58 MIL/uL (ref 3.87–5.11)
RDW: 13.1 % (ref 11.5–15.5)
WBC: 10.2 10*3/uL (ref 4.0–10.5)
nRBC: 0 % (ref 0.0–0.2)

## 2018-09-16 LAB — TSH: TSH: 2.509 u[IU]/mL (ref 0.350–4.500)

## 2018-09-16 LAB — LIPID PANEL
Cholesterol: 202 mg/dL — ABNORMAL HIGH (ref 0–200)
HDL: 62 mg/dL (ref >40)
LDL Cholesterol: 126 mg/dL — ABNORMAL HIGH (ref 0–99)
Total CHOL/HDL Ratio: 3.3 RATIO
Triglycerides: 68 mg/dL (ref <150)
VLDL: 14 mg/dL (ref 0–40)

## 2018-09-16 LAB — COMPREHENSIVE METABOLIC PANEL
ALT: 19 U/L (ref 0–44)
AST: 20 U/L (ref 15–41)
Albumin: 4 g/dL (ref 3.5–5.0)
Alkaline Phosphatase: 91 U/L (ref 38–126)
Anion gap: 8 (ref 5–15)
BUN: 17 mg/dL (ref 6–20)
CO2: 25 mmol/L (ref 22–32)
Calcium: 9 mg/dL (ref 8.9–10.3)
Chloride: 105 mmol/L (ref 98–111)
Creatinine, Ser: 0.72 mg/dL (ref 0.44–1.00)
GFR calc Af Amer: >60 mL/min (ref >60)
GFR calc non Af Amer: >60 mL/min (ref >60)
Glucose, Bld: 103 mg/dL — ABNORMAL HIGH (ref 70–99)
Potassium: 4.5 mmol/L (ref 3.5–5.1)
Sodium: 138 mmol/L (ref 135–145)
Total Bilirubin: 0.5 mg/dL (ref 0.3–1.2)
Total Protein: 7.5 g/dL (ref 6.5–8.1)

## 2018-09-16 LAB — HEMOGLOBIN A1C
Hgb A1c MFr Bld: 6 % — ABNORMAL HIGH (ref 4.8–5.6)
Mean Plasma Glucose: 125.5 mg/dL

## 2018-09-16 MED FILL — DICLOFENAC SODIUM 75 MG TAB: 75 | 30 days supply | Qty: 30 | Fill #1

## 2018-09-17 ENCOUNTER — Encounter: Payer: Self-pay | Admitting: Family Medicine

## 2018-09-17 LAB — VITAMIN D 25 HYDROXY (VIT D DEFICIENCY, FRACTURES): Vit D, 25-Hydroxy: 25.1 ng/mL — ABNORMAL LOW (ref 30.0–100.0)

## 2018-09-19 ENCOUNTER — Encounter: Payer: Self-pay | Admitting: Family Medicine

## 2018-09-19 ENCOUNTER — Other Ambulatory Visit (HOSPITAL_COMMUNITY)
Admission: RE | Admit: 2018-09-19 | Discharge: 2018-09-19 | Disposition: A | Discharge status: Home / Self Care | Payer: 59 | Source: Ambulatory Visit | Attending: *Deleted | Admitting: *Deleted

## 2018-09-19 ENCOUNTER — Other Ambulatory Visit: Payer: Self-pay

## 2018-09-19 ENCOUNTER — Ambulatory Visit (INDEPENDENT_AMBULATORY_CARE_PROVIDER_SITE_OTHER): Payer: 59 | Admitting: Family Medicine

## 2018-09-19 ENCOUNTER — Ambulatory Visit (HOSPITAL_COMMUNITY)
Admission: RE | Admit: 2018-09-19 | Discharge: 2018-09-19 | Disposition: A | Discharge status: Home / Self Care | Payer: 59 | Source: Ambulatory Visit | Attending: Family Medicine | Admitting: Family Medicine

## 2018-09-19 VITALS — BP 168/96 | HR 83 | Ht 62.0 in | Wt 179.0 lb

## 2018-09-19 DIAGNOSIS — F17210 Nicotine dependence, cigarettes, uncomplicated: Secondary | ICD-10-CM | POA: Diagnosis not present

## 2018-09-19 DIAGNOSIS — Z0000 Encounter for general adult medical examination without abnormal findings: Secondary | ICD-10-CM | POA: Diagnosis not present

## 2018-09-19 DIAGNOSIS — I10 Essential (primary) hypertension: Secondary | ICD-10-CM

## 2018-09-19 DIAGNOSIS — Z1231 Encounter for screening mammogram for malignant neoplasm of breast: Secondary | ICD-10-CM | POA: Insufficient documentation

## 2018-09-19 DIAGNOSIS — E669 Obesity, unspecified: Secondary | ICD-10-CM | POA: Diagnosis not present

## 2018-09-19 DIAGNOSIS — N3000 Acute cystitis without hematuria: Secondary | ICD-10-CM | POA: Diagnosis not present

## 2018-09-19 DIAGNOSIS — Z23 Encounter for immunization: Secondary | ICD-10-CM | POA: Diagnosis not present

## 2018-09-19 DIAGNOSIS — F17200 Nicotine dependence, unspecified, uncomplicated: Secondary | ICD-10-CM

## 2018-09-19 LAB — POCT URINALYSIS DIP (CLINITEK)
Bilirubin, UA: NEGATIVE
Blood, UA: NEGATIVE
Glucose, UA: NEGATIVE mg/dL
Ketones, POC UA: NEGATIVE mg/dL
Nitrite, UA: NEGATIVE
POC PROTEIN,UA: NEGATIVE
Spec Grav, UA: 1.01 (ref 1.010–1.025)
Urobilinogen, UA: 0.2 E.U./dL
pH, UA: 6 (ref 5.0–8.0)

## 2018-09-19 MED ORDER — TRIAMTERENE-HCTZ 75-50 MG PO TABS: 1.0000 | ORAL_TABLET | Freq: Every day | ORAL | 3 refills | Status: DC

## 2018-09-19 MED ORDER — UNABLE TO FIND: 0 refills | Status: DC

## 2018-09-19 MED ORDER — AMLODIPINE BESYLATE 5 MG PO TABS: 5.0000 mg | ORAL_TABLET | Freq: Every day | ORAL | 3 refills | Status: DC

## 2018-09-19 MED FILL — AMLODIPINE BESYLATE 5 MG TA: 5 | 90 days supply | Qty: 90 | Fill #0

## 2018-09-19 MED FILL — TRIAMTERENE/HCTZ 75/50 TAB: 75-50 | 90 days supply | Qty: 90 | Fill #0

## 2018-09-19 NOTE — Patient Instructions (Addendum)
F/U in 2 to 3  weeks with mD for BP re eval, if normal you may start phentermine daily   Shingrix #1 today  Urine being tested Please clear out the Twinkies  And bring in fruit and vegetables, eat regularly on schedule  And drink only water no doctor pepper  Now smoking 7 cigaretts/ day, try to limit yourself to 5/day  You will improve     DASH Eating Plan DASH stands for "Dietary Approaches to Stop Hypertension." The DASH eating plan is a healthy eating plan that has been shown to reduce high blood pressure (hypertension). It may also reduce your risk for type 2 diabetes, heart disease, and stroke. The DASH eating plan may also help with weight loss. What are tips for following this plan?  General guidelines  Avoid eating more than 2,300 mg (milligrams) of salt (sodium) a day. If you have hypertension, you may need to reduce your sodium intake to 1,500 mg a day.  Limit alcohol intake to no more than 1 drink a day for nonpregnant women and 2 drinks a day for men. One drink equals 12 oz of beer, 5 oz of wine, or 1 oz of hard liquor.  Work with your health care provider to maintain a healthy body weight or to lose weight. Ask what an ideal weight is for you.  Get at least 30 minutes of exercise that causes your heart to beat faster (aerobic exercise) most days of the week. Activities may include walking, swimming, or biking.  Work with your health care provider or diet and nutrition specialist (dietitian) to adjust your eating plan to your individual calorie needs. Reading food labels   Check food labels for the amount of sodium per serving. Choose foods with less than 5 percent of the Daily Value of sodium. Generally, foods with less than 300 mg of sodium per serving fit into this eating plan.  To find whole grains, look for the word "whole" as the first word in the ingredient list. Shopping  Buy products labeled as "low-sodium" or "no salt added."  Buy fresh foods. Avoid  canned foods and premade or frozen meals. Cooking  Avoid adding salt when cooking. Use salt-free seasonings or herbs instead of table salt or sea salt. Check with your health care provider or pharmacist before using salt substitutes.  Do not fry foods. Cook foods using healthy methods such as baking, boiling, grilling, and broiling instead.  Cook with heart-healthy oils, such as olive, canola, soybean, or sunflower oil. Meal planning  Eat a balanced diet that includes: ? 5 or more servings of fruits and vegetables each day. At each meal, try to fill half of your plate with fruits and vegetables. ? Up to 6-8 servings of whole grains each day. ? Less than 6 oz of lean meat, poultry, or fish each day. A 3-oz serving of meat is about the same size as a deck of cards. One egg equals 1 oz. ? 2 servings of low-fat dairy each day. ? A serving of nuts, seeds, or beans 5 times each week. ? Heart-healthy fats. Healthy fats called Omega-3 fatty acids are found in foods such as flaxseeds and coldwater fish, like sardines, salmon, and mackerel.  Limit how much you eat of the following: ? Canned or prepackaged foods. ? Food that is high in trans fat, such as fried foods. ? Food that is high in saturated fat, such as fatty meat. ? Sweets, desserts, sugary drinks, and other foods with added  sugar. ? Full-fat dairy products.  Do not salt foods before eating.  Try to eat at least 2 vegetarian meals each week.  Eat more home-cooked food and less restaurant, buffet, and fast food.  When eating at a restaurant, ask that your food be prepared with less salt or no salt, if possible. What foods are recommended? The items listed may not be a complete list. Talk with your dietitian about what dietary choices are best for you. Grains Whole-grain or whole-wheat bread. Whole-grain or whole-wheat pasta. Brown rice. [place]. Bulgur. Whole-grain and low-sodium cereals. Pita bread. Low-fat, low-sodium  crackers. Whole-wheat flour tortillas. Vegetables Fresh or frozen vegetables (raw, steamed, roasted, or grilled). Low-sodium or reduced-sodium tomato and vegetable juice. Low-sodium or reduced-sodium tomato sauce and tomato paste. Low-sodium or reduced-sodium canned vegetables. Fruits All fresh, dried, or frozen fruit. Canned fruit in natural juice (without added sugar). Meat and other protein foods Skinless chicken or [place]. Ground chicken or [place]. Pork with fat trimmed off. Fish and seafood. Egg whites. Dried beans, peas, or lentils. Unsalted nuts, nut butters, and seeds. Unsalted canned beans. Lean cuts of beef with fat trimmed off. Low-sodium, lean deli meat. Dairy Low-fat (1%) or fat-free (skim) milk. Fat-free, low-fat, or reduced-fat cheeses. Nonfat, low-sodium ricotta or cottage cheese. Low-fat or nonfat yogurt. Low-fat, low-sodium cheese. Fats and oils Soft margarine without trans fats. Vegetable oil. Low-fat, reduced-fat, or light mayonnaise and salad dressings (reduced-sodium). Canola, safflower, olive, soybean, and sunflower oils. Avocado. Seasoning and other foods Herbs. Spices. Seasoning mixes without salt. Unsalted popcorn and pretzels. Fat-free sweets. What foods are not recommended? The items listed may not be a complete list. Talk with your dietitian about what dietary choices are best for you. Grains Baked goods made with fat, such as croissants, muffins, or some breads. Dry pasta or rice meal packs. Vegetables Creamed or fried vegetables. Vegetables in a cheese sauce. Regular canned vegetables (not low-sodium or reduced-sodium). Regular canned tomato sauce and paste (not low-sodium or reduced-sodium). Regular tomato and vegetable juice (not low-sodium or reduced-sodium). Angie Fava. Olives. Fruits Canned fruit in a light or heavy syrup. Fried fruit. Fruit in cream or butter sauce. Meat and other protein foods Fatty cuts of meat. Ribs. Fried meat. Berniece Salines. Sausage. Bologna and  other processed lunch meats. Salami. Fatback. Hotdogs. Bratwurst. Salted nuts and seeds. Canned beans with added salt. Canned or smoked fish. Whole eggs or egg yolks. Chicken or [place] with skin. Dairy Whole or 2% milk, cream, and half-and-half. Whole or full-fat cream cheese. Whole-fat or sweetened yogurt. Full-fat cheese. Nondairy creamers. Whipped toppings. Processed cheese and cheese spreads. Fats and oils Butter. Stick margarine. Lard. Shortening. Ghee. Bacon fat. Tropical oils, such as coconut, palm kernel, or palm oil. Seasoning and other foods Salted popcorn and pretzels. Onion salt, garlic salt, seasoned salt, table salt, and sea salt. Worcestershire sauce. Tartar sauce. Barbecue sauce. Teriyaki sauce. Soy sauce, including reduced-sodium. Steak sauce. Canned and packaged gravies. Fish sauce. Oyster sauce. Cocktail sauce. Horseradish that you find on the shelf. Ketchup. Mustard. Meat flavorings and tenderizers. Bouillon cubes. Hot sauce and Tabasco sauce. Premade or packaged marinades. Premade or packaged taco seasonings. Relishes. Regular salad dressings. Where to find more information:  National Heart, Lung, and [place]: https://wilson-eaton.com/  American Heart Association: www.heart.org Summary  The DASH eating plan is a healthy eating plan that has been shown to reduce high blood pressure (hypertension). It may also reduce your risk for type 2 diabetes, heart disease, and stroke.  With the DASH eating  plan, you should limit salt (sodium) intake to 2,300 mg a day. If you have hypertension, you may need to reduce your sodium intake to 1,500 mg a day.  When on the DASH eating plan, aim to eat more fresh fruits and vegetables, whole grains, lean proteins, low-fat dairy, and heart-healthy fats.  Work with your health care provider or diet and nutrition specialist (dietitian) to adjust your eating plan to your individual calorie needs. This information is not intended to replace advice  given to you by your health care provider. Make sure you discuss any questions you have with your health care provider. Document Released: 02/12/2011 Document Revised: 02/05/2017 Document Reviewed: 02/17/2016 Elsevier Patient Education  2020 [employer].

## 2018-09-20 ENCOUNTER — Encounter: Payer: Self-pay | Admitting: Family Medicine

## 2018-09-20 LAB — URINE CULTURE: Culture: <10000 — AB

## 2018-09-22 ENCOUNTER — Encounter: Payer: Self-pay | Admitting: Family Medicine

## 2018-09-22 DIAGNOSIS — Z23 Encounter for immunization: Secondary | ICD-10-CM | POA: Insufficient documentation

## 2018-09-22 NOTE — Assessment & Plan Note (Signed)
Asked:confirms currently smokes cigarettes, approx 7/day Assess: Unwilling to set a  Quit date, but cutting back Advise: needs to QUIT to reduce risk of cancer, cardio and cerebrovascular disease Assist: counseled for 5 minutes and literature provided Arrange: follow up in 3 months

## 2018-09-22 NOTE — Progress Notes (Signed)
Zoe Bailey     MRN: [id number]      DOB: [birth year]  HPI: Patient is in for annual physical exam. Uncontrolled hypertension is addressed, pt stopped taking her medication, thought it was not working Smoking dependence is addressed. C/o urinary frequency and odor x 5 days, denies fever , chills and flank pain Recent labs,  are reviewed. Immunization is reviewed , and  updated    PE: BP (!) 168/96   Pulse 83   Wt 179 lb (81.2 kg)   SpO2 96%   BMI 32.74 kg/m   Pleasant  female, alert and oriented x 3, in no cardio-pulmonary distress. Afebrile. HEENT No facial trauma or asymetry. Sinuses non tender.  Extra occullar muscles intact  External ears normal, Oropharynx moist, no exudate. Neck: supple, no adenopathy,JVD or thyromegaly.No bruits.  Chest: Clear to ascultation bilaterally.No crackles or wheezes. Non tender to palpation    Cardiovascular system; Heart sounds normal,  S1 and  S2 ,no S3.  No murmur, or thrill. Apical beat not displaced Peripheral pulses normal.  Abdomen: Soft, non tender, no organomegaly or masses. No bruits. Bowel sounds normal. No guarding, tenderness or rebound.     Musculoskeletal exam: Full ROM of spine, hips , shoulders and knees. No deformity ,swelling or crepitus noted. No muscle wasting or atrophy.   Neurologic: Cranial nerves 2 to 12 intact. Power, tone ,sensation and reflexes normal throughout. No disturbance in gait. No tremor.  Skin: Intact, no ulceration, erythema , scaling or rash noted. Pigmentation normal throughout  Psych; Normal mood and affect. Judgement and concentration normal   Assessment & Plan:  Annual physical exam Annual exam as documented. Counseling done  re healthy lifestyle involving commitment to 150 minutes exercise per week, heart healthy diet, and attaining healthy weight.The importance of adequate sleep also discussed.  Changes in health habits are decided on by the patient with goals and  time frames  set for achieving them. Immunization and cancer screening needs are specifically addressed at this visit.   Essential hypertension Uncontrolled due to non compliance DASH diet and commitment to daily physical activity for a minimum of 30 minutes discussed and encouraged, as a part of hypertension management. The importance of attaining a healthy weight is also discussed.  BP/Weight 09/19/2018 06/13/2018 02/17/2018 02/10/2018 02/07/2018 07/15/5636 09/11/6431  Systolic BP 295 188 416 606 301 601 093  Diastolic BP 96 80 84 75 74 70 86  Wt. (Lbs) 179 175 173 170 174.12 166.08 168  BMI 32.74 32.01 31.64 31.09 31.85 30.38 30.73     Resume triamterene , add norvasc, review in 2 to 3 weeks  NICOTINE ADDICTION Asked:confirms currently smokes cigarettes, approx 7/day Assess: Unwilling to set a  Quit date, but cutting back Advise: needs to QUIT to reduce risk of cancer, cardio and cerebrovascular disease Assist: counseled for 5 minutes and literature provided Arrange: follow up in 3 months   Obesity (BMI 30.0-34.9)  Patient re-educated about  the importance of commitment to a  minimum of 150 minutes of exercise per week as able.  The importance of healthy food choices with portion control discussed, as well as eating regularly and within a 12 hour window most days. The need to choose "clean , green" food 50 to 75% of the time is discussed, as well as to make water the primary drink and set a goal of 64 ounces water daily.    Weight /BMI 09/19/2018 06/13/2018 02/17/2018  WEIGHT 179 lb 175 lb 173 lb  HEIGHT - 5\' 2"  5\' 2"   BMI 32.74 kg/m2 32.01 kg/m2 31.64 kg/m2  When bP normal will start phentermine

## 2018-09-22 NOTE — Assessment & Plan Note (Signed)
  Patient re-educated about  the importance of commitment to a  minimum of 150 minutes of exercise per week as able.  The importance of healthy food choices with portion control discussed, as well as eating regularly and within a 12 hour window most days. The need to choose "clean , green" food 50 to 75% of the time is discussed, as well as to make water the primary drink and set a goal of 64 ounces water daily.    Weight /BMI 09/19/2018 06/13/2018 02/17/2018  WEIGHT 179 lb 175 lb 173 lb  HEIGHT - 5\' 2"  5\' 2"   BMI 32.74 kg/m2 32.01 kg/m2 31.64 kg/m2  When bP normal will start phentermine

## 2018-09-22 NOTE — Assessment & Plan Note (Signed)
Annual exam as documented. Counseling done  re healthy lifestyle involving commitment to 150 minutes exercise per week, heart healthy diet, and attaining healthy weight.The importance of adequate sleep also discussed. Changes in health habits are decided on by the patient with goals and time frames  set for achieving them. Immunization and cancer screening needs are specifically addressed at this visit. 

## 2018-09-22 NOTE — Assessment & Plan Note (Signed)
Uncontrolled due to non compliance DASH diet and commitment to daily physical activity for a minimum of 30 minutes discussed and encouraged, as a part of hypertension management. The importance of attaining a healthy weight is also discussed.  BP/Weight 09/19/2018 06/13/2018 02/17/2018 02/10/2018 02/07/2018 03/14/1094 0/06/5407  Systolic BP 811 914 782 956 213 086 578  Diastolic BP 96 80 84 75 74 70 86  Wt. (Lbs) 179 175 173 170 174.12 166.08 168  BMI 32.74 32.01 31.64 31.09 31.85 30.38 30.73     Resume triamterene , add norvasc, review in 2 to 3 weeks

## 2018-09-22 NOTE — Assessment & Plan Note (Signed)
After obtaining informed consent, the vaccine is  administered , with no adverse effect noted at the time of administration.  

## 2018-09-26 DIAGNOSIS — M722 Plantar fascial fibromatosis: Secondary | ICD-10-CM | POA: Diagnosis not present

## 2018-09-26 DIAGNOSIS — M2140 Flat foot [pes planus] (acquired), unspecified foot: Secondary | ICD-10-CM | POA: Diagnosis not present

## 2018-09-26 DIAGNOSIS — M79672 Pain in left foot: Secondary | ICD-10-CM | POA: Diagnosis not present

## 2018-09-28 DIAGNOSIS — M791 Myalgia, unspecified site: Secondary | ICD-10-CM | POA: Diagnosis not present

## 2018-09-28 DIAGNOSIS — G43719 Chronic migraine without aura, intractable, without status migrainosus: Secondary | ICD-10-CM | POA: Diagnosis not present

## 2018-09-28 DIAGNOSIS — M542 Cervicalgia: Secondary | ICD-10-CM | POA: Diagnosis not present

## 2018-10-05 MED FILL — CYCLOBENZAPRINE 10 MG TAB: 10 | 10 days supply | Qty: 30 | Fill #0

## 2018-10-06 MED FILL — BLOOD PRESSURE MONITOR MISC: 30 days supply | Qty: 1 | Fill #0

## 2018-10-10 ENCOUNTER — Encounter: Payer: Self-pay | Admitting: Family Medicine

## 2018-10-10 ENCOUNTER — Ambulatory Visit (INDEPENDENT_AMBULATORY_CARE_PROVIDER_SITE_OTHER): Payer: 59 | Admitting: Family Medicine

## 2018-10-10 ENCOUNTER — Other Ambulatory Visit: Payer: Self-pay

## 2018-10-10 VITALS — BP 120/76 | HR 94 | Resp 12 | Ht 62.0 in | Wt 178.1 lb

## 2018-10-10 DIAGNOSIS — F17200 Nicotine dependence, unspecified, uncomplicated: Secondary | ICD-10-CM

## 2018-10-10 DIAGNOSIS — I10 Essential (primary) hypertension: Secondary | ICD-10-CM | POA: Diagnosis not present

## 2018-10-10 DIAGNOSIS — Z23 Encounter for immunization: Secondary | ICD-10-CM | POA: Diagnosis not present

## 2018-10-10 DIAGNOSIS — E669 Obesity, unspecified: Secondary | ICD-10-CM | POA: Diagnosis not present

## 2018-10-10 MED ORDER — PHENTERMINE HCL 37.5 MG PO TABS: 37.5000 mg | ORAL_TABLET | Freq: Every day | ORAL | 3 refills | Status: DC

## 2018-10-10 MED FILL — PHENTERMINE 37.5 MG TABLET: 37.5 | 30 days supply | Qty: 30 | Fill #0

## 2018-10-10 NOTE — Patient Instructions (Signed)
F/U in 3 months, call if you need me before  Blood pressure is excellent , continue medication  Goa`l of weight loss is 3 to 4 pounds per month  Sweet and sugar is deceptive and NOT GOOD for weight loss  Phentermine one daily is prescribed    Congrats on smoking cessation plan in 2 days max, no more cigarettes after these 8 that you already have  Thanks for choosing [facility], we consider it a privelige to serve you.

## 2018-10-11 DIAGNOSIS — Z20828 Contact with and (suspected) exposure to other viral communicable diseases: Secondary | ICD-10-CM | POA: Diagnosis not present

## 2018-10-11 NOTE — Progress Notes (Signed)
   CZARINA GINGRAS     MRN: [id number]      DOB: 27-Aug-1965   HPI Ms. Trussell is here for follow up and re-evaluation of chronic medical conditions, medication management and review of any available recent lab and radiology data.  Preventive health is updated, specifically  Cancer screening and Immunization.   Questions or concerns regarding consultations or procedures which the PT has had in the interim are  addressed. The PT denies any adverse reactions to current medications since the last visit. Feels much better on current BP medication  ROS Denies recent fever or chills. Denies sinus pressure, nasal congestion, ear pain or sore throat. Denies chest congestion, productive cough or wheezing. Denies chest pains, palpitations and leg swelling Denies abdominal pain, nausea, vomiting,diarrhea or constipation.   Denies dysuria, frequency, hesitancy or incontinence. Denies joint pain, swelling and limitation in mobility. Denies headaches, seizures, numbness, or tingling. Denies depression, anxiety or insomnia. Denies skin break down or rash.   PE  BP 120/76   Pulse 94   Resp 12   Ht 5\' 2"  (1.575 m)   Wt 178 lb 1.9 oz (80.8 kg)   SpO2 95%   BMI 32.58 kg/m   Patient alert and oriented and in no cardiopulmonary distress.  HEENT: No facial asymmetry, EOMI,   oropharynx pink and moist.  Neck supple no JVD, no mass.  Chest: Clear to auscultation bilaterally.  CVS: S1, S2 no murmurs, no S3.Regular rate.  ABD: Soft non tender.   Ext: No edema  MS: Adequate ROM spine, shoulders, hips and knees.  Skin: Intact, no ulcerations or rash noted.  Psych: Good eye contact, normal affect. Memory intact not anxious or depressed appearing.  CNS: CN 2-12 intact, power,  normal throughout.no focal deficits noted.   Assessment & Plan  Essential hypertension Controlled, no change in medication DASH diet and commitment to daily physical activity for a minimum of 30 minutes discussed and  encouraged, as a part of hypertension management. The importance of attaining a healthy weight is also discussed.  BP/Weight 10/10/2018 09/19/2018 06/13/2018 02/17/2018 02/10/2018 75/03/256 07/08/7780  Systolic BP 423 536 144 315 400 867 619  Diastolic BP 76 96 80 84 75 74 70  Wt. (Lbs) 178.12 179 175 173 170 174.12 166.08  BMI 32.58 32.74 32.01 31.64 31.09 31.85 30.38       Need for zoster vaccine After obtaining informed consent, the vaccine is  administered , with no adverse effect noted at the time of administration.   NICOTINE ADDICTION Asked:confirms currently smokes cigarettes Assess: willing to quit and  cutting back Advise: needs to QUIT to reduce risk of cancer, cardio and cerebrovascular disease Assist: counseled for 5 minutes and literature provided Arrange: follow up in 3 months   Obesity (BMI 30.0-34.9)  Patient re-educated about  the importance of commitment to a  minimum of 150 minutes of exercise per week as able.  The importance of healthy food choices with portion control discussed, as well as eating regularly and within a 12 hour window most days. The need to choose "clean , green" food 50 to 75% of the time is discussed, as well as to make water the primary drink and set a goal of 64 ounces water daily.    Weight /BMI 10/10/2018 09/19/2018 06/13/2018  WEIGHT 178 lb 1.9 oz 179 lb 175 lb  HEIGHT 5\' 2"  5\' 2"  5\' 2"   BMI 32.58 kg/m2 32.74 kg/m2 32.01 kg/m2  Start phentermine

## 2018-10-15 ENCOUNTER — Encounter: Payer: Self-pay | Admitting: Family Medicine

## 2018-10-15 NOTE — Assessment & Plan Note (Signed)
  Patient re-educated about  the importance of commitment to a  minimum of 150 minutes of exercise per week as able.  The importance of healthy food choices with portion control discussed, as well as eating regularly and within a 12 hour window most days. The need to choose "clean , green" food 50 to 75% of the time is discussed, as well as to make water the primary drink and set a goal of 64 ounces water daily.    Weight /BMI 10/10/2018 09/19/2018 06/13/2018  WEIGHT 178 lb 1.9 oz 179 lb 175 lb  HEIGHT 5\' 2"  5\' 2"  5\' 2"   BMI 32.58 kg/m2 32.74 kg/m2 32.01 kg/m2  Start phentermine

## 2018-10-15 NOTE — Assessment & Plan Note (Signed)
Controlled, no change in medication DASH diet and commitment to daily physical activity for a minimum of 30 minutes discussed and encouraged, as a part of hypertension management. The importance of attaining a healthy weight is also discussed.  BP/Weight 10/10/2018 09/19/2018 06/13/2018 02/17/2018 02/10/2018 97/11/4799 08/11/5372  Systolic BP 827 078 675 449 201 007 121  Diastolic BP 76 96 80 84 75 74 70  Wt. (Lbs) 178.12 179 175 173 170 174.12 166.08  BMI 32.58 32.74 32.01 31.64 31.09 31.85 30.38

## 2018-10-15 NOTE — Assessment & Plan Note (Signed)
After obtaining informed consent, the vaccine is  administered , with no adverse effect noted at the time of administration.  

## 2018-10-15 NOTE — Assessment & Plan Note (Signed)
Asked:confirms currently smokes cigarettes Assess: willing to quit and cutting back Advise: needs to QUIT to reduce risk of cancer, cardio and cerebrovascular disease Assist: counseled for 5 minutes and literature provided Arrange: follow up in 3 months  

## 2018-10-20 MED FILL — DICLOFENAC SODIUM 75 MG TAB: 75 | 30 days supply | Qty: 30 | Fill #2

## 2018-10-20 MED FILL — buPROPion HCL ER (XL) 300 M: 300 | 90 days supply | Qty: 90 | Fill #0

## 2018-11-07 MED FILL — PHENTERMINE 37.5 MG TABLET: 37.5 | 30 days supply | Qty: 30 | Fill #1

## 2018-11-09 MED FILL — BOTOX 100 UNITS VIAL: 100 | 84 days supply | Qty: 2 | Fill #1

## 2018-11-10 ENCOUNTER — Encounter: Payer: Self-pay | Admitting: Family Medicine

## 2018-11-10 ENCOUNTER — Other Ambulatory Visit: Payer: Self-pay

## 2018-11-10 ENCOUNTER — Ambulatory Visit (INDEPENDENT_AMBULATORY_CARE_PROVIDER_SITE_OTHER): Payer: 59 | Admitting: Family Medicine

## 2018-11-10 VITALS — BP 120/82 | Ht 62.0 in | Wt 174.0 lb

## 2018-11-10 DIAGNOSIS — J029 Acute pharyngitis, unspecified: Secondary | ICD-10-CM

## 2018-11-10 DIAGNOSIS — I10 Essential (primary) hypertension: Secondary | ICD-10-CM

## 2018-11-10 MED ORDER — AZITHROMYCIN 500 MG PO TABS: 500.0000 mg | ORAL_TABLET | Freq: Every day | ORAL | 0 refills | Status: AC

## 2018-11-10 NOTE — Progress Notes (Signed)
Virtual Visit via Telephone Note   This visit type was conducted due to national recommendations for restrictions regarding the COVID-19 Pandemic (e.g. social distancing) in an effort to limit this patient's exposure and mitigate transmission in our community.  Due to her co-morbid illnesses, this patient is at least at moderate risk for complications without adequate follow up.  This format is felt to be most appropriate for this patient at this time.  The patient did not have access to video technology/had technical difficulties with video requiring transitioning to audio format only (telephone).  All issues noted in this document were discussed and addressed.  No physical exam could be performed with this format.    Evaluation Performed:  Follow-up visit  Date:  11/10/2018   ID:  Zoe Bailey, DOB [birth year], MRN [id number]  Patient Location: Home Provider Location: Office  Location of Patient: Home Location of Provider: Telehealth Consent was obtain for visit to be over via telehealth. I verified that I am speaking with the correct person using two identifiers.  PCP:  Fayrene Helper, MD   Chief Complaint:    History of Present Illness:    Zoe Bailey is a 53 y.o. female with history of arthritis, COPD, shortness of breath, migraine among others.  Reports that she has swollen glands in her throat very difficult and hard to swallow and uncomfortable to talk.  Reports she has white bumps on the back of her throat and at the tongue area.  She denies having any fever, cough, shortness of breath or any other signs of symptoms such as nasal drainage or for sinus infection or ear infection or chest infection.  She denies being around anybody that has had recent symptoms of what she has.  And she is recently been tested for COVID and it was negative.  The patient does not have symptoms concerning for COVID-19 infection (fever, chills, cough, or new shortness of breath).   Past Medical,  Surgical, Social History, Allergies, and Medications have been Reviewed.   Past Medical History:  Diagnosis Date  . Arthritis   . Cancer ([place]) 2010 approx   skin cancer  . Complication of anesthesia    Wakes up during all surgeries  . COPD (chronic obstructive pulmonary disease) ([place])   . Depression   . Foot pain, right 03/2014  . Migraine    USES BOTOX FOR TREATMENT   . Shortness of breath dyspnea   . Sleep apnea   . Toenail fungus    Past Surgical History:  Procedure Laterality Date  . ANKLE FRACTURE SURGERY Right   . BREAST ENHANCEMENT SURGERY    . CARPAL TUNNEL RELEASE Right    x2  . CHONDROPLASTY Right 02/17/2018   Procedure: CHONDROPLASTY;  Surgeon: Paralee Cancel, MD;  Location: [facility];  Service: Orthopedics;  Laterality: Right;  . COLONOSCOPY WITH PROPOFOL N/A 05/07/2017   Procedure: COLONOSCOPY WITH PROPOFOL;  Surgeon: Rogene Houston, MD;  Location: [facility];  Service: Endoscopy;  Laterality: N/A;  8:30  . HERNIA REPAIR     umbilical  . KNEE ARTHROSCOPY WITH MEDIAL MENISECTOMY Right 02/17/2018   Procedure: KNEE ARTHROSCOPY WITH MEDIAL MENISECTOMY;  Surgeon: Paralee Cancel, MD;  Location: [facility];  Service: Orthopedics;  Laterality: Right;  . NASAL POLYP SURGERY    . POLYPECTOMY       Current Meds  Medication Sig  . amLODipine (NORVASC) 5 MG tablet Take 1 tablet (5 mg total) by mouth daily.  Marland Kitchen  benzonatate (TESSALON) 100 MG capsule Take 1 capsule (100 mg total) by mouth 2 (two) times daily as needed for cough.  Marland Kitchen buPROPion (WELLBUTRIN XL) 300 MG 24 hr tablet TAKE 1 TABLET BY MOUTH DAILY.  . busPIRone (BUSPAR) 10 MG tablet Take 1 tablet (10 mg total) by mouth 3 (three) times daily. (Patient taking differently: Take 10 mg by mouth daily as needed (anxiety). )  . cyclobenzaprine (FLEXERIL) 10 MG tablet TAKE 1 TABLET BY MOUTH 3 TIMES DAILY AS NEEDED FOR MUSCLE SPASMS.  Marland Kitchen diclofenac (VOLTAREN) 75 MG EC tablet TAKE 1 TABLET BY MOUTH ONCE DAILY AS NEEDED FOR PAIN  .  gabapentin (NEURONTIN) 400 MG capsule Take two capsules by mouth every morning for migraine prevention  . Multiple Vitamin (MULTIVITAMIN WITH MINERALS) TABS tablet Take 2 tablets by mouth daily.   . Multiple Vitamins-Minerals (HAIR SKIN AND NAILS FORMULA) TABS Take 1 tablet by mouth 2 (two) times daily.  . OnabotulinumtoxinA (BOTOX IM) Inject 1 Dose into the muscle every 3 (three) months.  . pantoprazole (PROTONIX) 20 MG tablet Take 1 tablet (20 mg total) by mouth daily.  . phentermine (ADIPEX-P) 37.5 MG tablet Take 1 tablet (37.5 mg total) by mouth daily before breakfast.  . tiotropium (SPIRIVA HANDIHALER) 18 MCG inhalation capsule Place 1 capsule (18 mcg total) into inhaler and inhale daily for 30 days.  Marland Kitchen triamterene-hydrochlorothiazide (MAXZIDE) 75-50 MG tablet Take 1 tablet by mouth daily.  . Turmeric (QC TUMERIC COMPLEX) 500 MG CAPS Take 4 capsules by mouth daily.  Marland Kitchen UNABLE TO FIND Blood pressure monitor x 1  DX I10     Allergies:   Effexor [venlafaxine] and Other   Social History   Tobacco Use  . Smoking status: Current Every Day Smoker    Packs/day: 0.25    Years: 33.00    Pack years: 8.25  . Smokeless tobacco: Never Used  Substance Use Topics  . Alcohol use: No    Alcohol/week: 0.0 standard drinks  . Drug use: No     Family Hx: The patient's family history includes Arthritis in her unknown relative; Cancer in her unknown relative.  ROS:   Please see the history of present illness.    All other systems reviewed and are negative.   Labs/Other Tests and Data Reviewed:    Recent Labs: 09/16/2018: ALT 19; BUN 17; Creatinine, Ser 0.72; Hemoglobin 14.1; Platelets 219; Potassium 4.5; Sodium 138; TSH 2.509   Recent Lipid Panel Lab Results  Component Value Date/Time   CHOL 202 (H) 09/16/2018 08:20 AM   TRIG 68 09/16/2018 08:20 AM   HDL 62 09/16/2018 08:20 AM   CHOLHDL 3.3 09/16/2018 08:20 AM   LDLCALC 126 (H) 09/16/2018 08:20 AM   LDLCALC 104 (H) 01/26/2018 08:03 AM     Wt Readings from Last 3 Encounters:  11/10/18 174 lb (78.9 kg)  10/10/18 178 lb 1.9 oz (80.8 kg)  09/19/18 179 lb (81.2 kg)     Objective:    Vital Signs:  BP 120/82   Ht 5\' 2"  (1.575 m)   Wt 174 lb (78.9 kg)   BMI 31.83 kg/m    GEN:  alert and oriented RESPIRATORY:  no cough or shortness of breath noted on conversation PSYCH:  normal mood and affect  ASSESSMENT & PLAN:    1. Pharyngitis, unspecified etiology Signs and symptoms seem consistent with pharyngitis.  Unable to assess with a strep test here in the office.  We will be covering for Zithromax.  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  Advised to make sure that she stays away from other individuals during the next couple days while she is getting antibiotic in her system she denies having any fever or any other signs or symptoms outside of an extreme sore throat with white patches.  Reviewed side effects, risks and benefits of medication.   Patient acknowledged agreement and understanding of the plan.    - azithromycin (ZITHROMAX) 500 MG tablet; Take 1 tablet (500 mg total) by mouth daily for 3 days.  Dispense: 3 tablet; Refill: 0  2. Essential hypertension Controlled, continue current medication regimen.  No refills needed.   Time:   Today, I have spent 10 minutes with the patient with telehealth technology discussing the above problems.     Medication Adjustments/Labs and Tests Ordered: Current medicines are reviewed at length with the patient today.  Concerns regarding medicines are outlined above.   Tests Ordered: No orders of the defined types were placed in this encounter.   Medication Changes: Meds ordered this encounter  Medications  . azithromycin (ZITHROMAX) 500 MG tablet    Sig: Take 1 tablet (500 mg total) by mouth daily for 3 days.    Dispense:  3 tablet    Refill:  0    Order Specific Question:   Supervising Provider    Answer:   Fayrene Helper [id number]     Disposition:  Follow up 01/12/2019  Signed, Perlie Mayo, NP  11/10/2018 1:31 PM     [facility]

## 2018-11-10 NOTE — Patient Instructions (Signed)
    Thank you for completing your appointment via the phone. I appreciate the opportunity to provide you with the care for your health and wellness. Today we discussed: Sore throat//pharyngitis  Follow-up: 01/12/2019  No labs today  Antibiotic sent in for pharyngitis possible strep.  Unable to do strep test why you being on the phone today.  Please be mindful that strep is highly contagious to please avoid being around other people during the duration of your treatment.  Very glad that she did not have COVID if your symptoms get worse or you develop a fever he started having problems outside of the symptoms that you described today please call the office back.  Please take all the antibiotic in its full.  Please get plenty of water increase your vitamin C fresh fruits and vegetables.  Please make sure you are getting plenty of rest as well.  Additionally you can use salt water gargles to help with sore throat.  Please continue to practice social distancing to keep you, your family, and our community safe.  If you must go out, please wear a Mask and practice good handwashing.  Pandora YOUR HANDS WELL AND FREQUENTLY. AVOID TOUCHING YOUR FACE, UNLESS YOUR HANDS ARE FRESHLY WASHED.  GET FRESH AIR DAILY. STAY HYDRATED WITH WATER.   It was a pleasure to see you and I look forward to continuing to work together on your health and well-being. Please do not hesitate to call the office if you need care or have questions about your care.  Have a wonderful day and week.  With Gratitude,  Cherly Beach, DNP, AGNP-BC

## 2018-11-18 MED FILL — DICLOFENAC SODIUM 75 MG TAB: 75 | 30 days supply | Qty: 30 | Fill #3

## 2018-11-23 MED FILL — GABAPENTIN 400 MG CAPSULE: 400 | 90 days supply | Qty: 180 | Fill #0

## 2018-11-29 ENCOUNTER — Encounter: Payer: Self-pay | Admitting: *Deleted

## 2018-11-30 DIAGNOSIS — M542 Cervicalgia: Secondary | ICD-10-CM | POA: Diagnosis not present

## 2018-11-30 DIAGNOSIS — M791 Myalgia, unspecified site: Secondary | ICD-10-CM | POA: Diagnosis not present

## 2018-11-30 DIAGNOSIS — G43719 Chronic migraine without aura, intractable, without status migrainosus: Secondary | ICD-10-CM | POA: Diagnosis not present

## 2018-11-30 DIAGNOSIS — G518 Other disorders of facial nerve: Secondary | ICD-10-CM | POA: Diagnosis not present

## 2018-12-07 ENCOUNTER — Ambulatory Visit (INDEPENDENT_AMBULATORY_CARE_PROVIDER_SITE_OTHER): Payer: 59 | Admitting: Family Medicine

## 2018-12-07 ENCOUNTER — Encounter: Payer: Self-pay | Admitting: Family Medicine

## 2018-12-07 ENCOUNTER — Other Ambulatory Visit: Payer: Self-pay

## 2018-12-07 VITALS — BP 134/86 | HR 96 | Temp 98.7°F | Resp 14 | Ht 62.0 in | Wt 174.0 lb

## 2018-12-07 DIAGNOSIS — B002 Herpesviral gingivostomatitis and pharyngotonsillitis: Secondary | ICD-10-CM

## 2018-12-07 DIAGNOSIS — R220 Localized swelling, mass and lump, head: Secondary | ICD-10-CM

## 2018-12-07 DIAGNOSIS — J029 Acute pharyngitis, unspecified: Secondary | ICD-10-CM

## 2018-12-07 LAB — POCT RAPID STREP A (OFFICE): Rapid Strep A Screen: NEGATIVE

## 2018-12-07 MED ORDER — NYSTATIN 100000 UNIT/ML MT SUSP: 5.0000 mL | Freq: Four times a day (QID) | OROMUCOSAL | 0 refills | Status: DC

## 2018-12-07 MED ORDER — VALACYCLOVIR HCL 1 G PO TABS: 1000.0000 mg | ORAL_TABLET | Freq: Two times a day (BID) | ORAL | 0 refills | Status: AC

## 2018-12-07 MED FILL — valACYclovir HCL 1 GM TABS: 1 | 7 days supply | Qty: 14 | Fill #0

## 2018-12-07 MED FILL — NYSTATIN 100,000 UNITS/ML S: 100000 | 3 days supply | Qty: 60 | Fill #0

## 2018-12-07 NOTE — Progress Notes (Signed)
Subjective:     Patient ID: Zoe Bailey, female   DOB: 03-03-1966, 53 y.o.   MRN: [id number]  ALEEYA DEMBY presents for Sore Throat (still has bumps on tongue and white patches on throat. hurts to swallow. has been eating only soup and broth.)  Ms. Hollenberg was seen by me via telephone visit back on September 3.  At that time I treated her for pharyngitis.  She reported that she did not feel any better.  After the treatment she did a MD live video and they treated her for strep.  She has completed 10 days of amoxicillin and does not feel any better.  She reports that it hurts to swallow.  She has some white tongue at times.  Additionally has bumps that are on her tongue and starting to increase in nature.  She has a small sore at the crack of her mouth as well.  She denies having any fevers or chills.  She denies having any breathing trouble.  She did report that she did have some chest discomfort/pressure but after completing the antibiotics those got better.  She reports that warm fluids are the best at this time.  She does have a history of smoking but she is not smoking right now because it hurts her mouth to smoke.  She denies having any exposure to HIV that she knows of.  She has not had a flu vaccine yet this year secondary to being sick.  She has not been around anybody with the same symptoms.  She reports that she takes her medicine for acid reflux and does not have any trouble with that.  She denies having any cough.  She denies having any vomiting.  Or nausea.  She denies any headaches or any other presentation of sinus-like symptoms.  Today patient denies signs and symptoms of COVID 19 infection including fever, chills, cough, shortness of breath, and headache.  Past Medical, Surgical, Social History, Allergies, and Medications have been Reviewed.  Past Medical History:  Diagnosis Date  . Arthritis   . Cancer ([place]) 2010 approx   skin cancer  . Complication of anesthesia    Wakes up during  all surgeries  . COPD (chronic obstructive pulmonary disease) ([place])   . Depression   . Foot pain, right 03/2014  . Migraine    USES BOTOX FOR TREATMENT   . Shortness of breath dyspnea   . Sleep apnea   . Toenail fungus    Past Surgical History:  Procedure Laterality Date  . ANKLE FRACTURE SURGERY Right   . BREAST ENHANCEMENT SURGERY    . CARPAL TUNNEL RELEASE Right    x2  . CHONDROPLASTY Right 02/17/2018   Procedure: CHONDROPLASTY;  Surgeon: Paralee Cancel, MD;  Location: [facility];  Service: Orthopedics;  Laterality: Right;  . COLONOSCOPY WITH PROPOFOL N/A 05/07/2017   Procedure: COLONOSCOPY WITH PROPOFOL;  Surgeon: Rogene Houston, MD;  Location: [facility];  Service: Endoscopy;  Laterality: N/A;  8:30  . HERNIA REPAIR     umbilical  . KNEE ARTHROSCOPY WITH MEDIAL MENISECTOMY Right 02/17/2018   Procedure: KNEE ARTHROSCOPY WITH MEDIAL MENISECTOMY;  Surgeon: Paralee Cancel, MD;  Location: [facility];  Service: Orthopedics;  Laterality: Right;  . NASAL POLYP SURGERY    . POLYPECTOMY     Social History   Socioeconomic History  . Marital status: Divorced    Spouse name: Not on file  . Number of children: 1  . Years of education: college  .  Highest education level: Not on file  Occupational History  . Occupation: Electrical engineer: [employer]  . Financial resource strain: Not on file  . Food insecurity    Worry: Not on file    Inability: Not on file  . Transportation needs    Medical: Not on file    Non-medical: Not on file  Tobacco Use  . Smoking status: Current Every Day Smoker    Packs/day: 0.25    Years: 33.00    Pack years: 8.25  . Smokeless tobacco: Never Used  Substance and Sexual Activity  . Alcohol use: No    Alcohol/week: 0.0 standard drinks  . Drug use: No  . Sexual activity: Yes    Birth control/protection: None  Lifestyle  . Physical activity    Days per week: Not on file    Minutes per session: Not on file  .  Stress: Not on file  Relationships  . Social Herbalist on phone: Not on file    Gets together: Not on file    Attends religious service: Not on file    Active member of club or organization: Not on file    Attends meetings of clubs or organizations: Not on file    Relationship status: Not on file  . Intimate partner violence    Fear of current or ex partner: Not on file    Emotionally abused: Not on file    Physically abused: Not on file    Forced sexual activity: Not on file  Other Topics Concern  . Not on file  Social History Narrative   Divorced. Lives alone. Has 2 dogs    Outpatient Encounter Medications as of 12/07/2018  Medication Sig  . amLODipine (NORVASC) 5 MG tablet Take 1 tablet (5 mg total) by mouth daily.  . benzonatate (TESSALON) 100 MG capsule Take 1 capsule (100 mg total) by mouth 2 (two) times daily as needed for cough.  Marland Kitchen buPROPion (WELLBUTRIN XL) 300 MG 24 hr tablet TAKE 1 TABLET BY MOUTH DAILY.  . busPIRone (BUSPAR) 10 MG tablet Take 1 tablet (10 mg total) by mouth 3 (three) times daily. (Patient taking differently: Take 10 mg by mouth daily as needed (anxiety). )  . cyclobenzaprine (FLEXERIL) 10 MG tablet TAKE 1 TABLET BY MOUTH 3 TIMES DAILY AS NEEDED FOR MUSCLE SPASMS.  Marland Kitchen diclofenac (VOLTAREN) 75 MG EC tablet TAKE 1 TABLET BY MOUTH ONCE DAILY AS NEEDED FOR PAIN  . gabapentin (NEURONTIN) 400 MG capsule Take two capsules by mouth every morning for migraine prevention  . Multiple Vitamin (MULTIVITAMIN WITH MINERALS) TABS tablet Take 2 tablets by mouth daily.   . Multiple Vitamins-Minerals (HAIR SKIN AND NAILS FORMULA) TABS Take 1 tablet by mouth 2 (two) times daily.  . OnabotulinumtoxinA (BOTOX IM) Inject 1 Dose into the muscle every 3 (three) months.  . pantoprazole (PROTONIX) 20 MG tablet Take 1 tablet (20 mg total) by mouth daily.  . phentermine (ADIPEX-P) 37.5 MG tablet Take 1 tablet (37.5 mg total) by mouth daily before breakfast.  . tiotropium  (SPIRIVA HANDIHALER) 18 MCG inhalation capsule Place 1 capsule (18 mcg total) into inhaler and inhale daily for 30 days.  Marland Kitchen triamterene-hydrochlorothiazide (MAXZIDE) 75-50 MG tablet Take 1 tablet by mouth daily.  . Turmeric (QC TUMERIC COMPLEX) 500 MG CAPS Take 4 capsules by mouth daily.  Marland Kitchen UNABLE TO FIND Blood pressure monitor x 1  DX I10  .  nystatin (MYCOSTATIN) 100000 UNIT/ML suspension Take 5 mLs (500,000 Units total) by mouth 4 (four) times daily.  . valACYclovir (VALTREX) 1000 MG tablet Take 1 tablet (1,000 mg total) by mouth 2 (two) times daily for 7 days.   No facility-administered encounter medications on file as of 12/07/2018.    Allergies  Allergen Reactions  . Effexor [Venlafaxine] Other (See Comments)    Decreased libido  . Other     Pt has a fear of tourniquets    Review of Systems  Constitutional: Negative for chills and fever.  HENT:       Tongue swelling Bumps Redness Sore and burn   All other systems reviewed and are negative.      Objective:     BP 134/86   Pulse 96   Temp 98.7 F (37.1 C) (Oral)   Resp 14   Ht 5\' 2"  (1.575 m)   Wt 174 lb 0.6 oz (78.9 kg)   SpO2 95%   BMI 31.83 kg/m   Physical Exam Vitals signs and nursing note reviewed.  Constitutional:      Appearance: Normal appearance. She is well-developed and well-groomed.  HENT:     Head: Normocephalic and atraumatic.     Right Ear: Tympanic membrane, ear canal and external ear normal.     Left Ear: Tympanic membrane, ear canal and external ear normal.     Nose: Nose normal. No congestion or rhinorrhea.     Mouth/Throat:     Lips: Lesions present.     Mouth: Mucous membranes are dry. Oral lesions present.     Tongue: No lesions. Tongue does not deviate from midline.     Pharynx: Uvula midline. Posterior oropharyngeal erythema present. No pharyngeal swelling, oropharyngeal exudate or uvula swelling.     Tonsils: No tonsillar exudate.     Comments: Swelling of tongue Small vesicle  bumps over back and front of tongue Small lesion at corner of mouth  Eyes:     General:        Right eye: No discharge.        Left eye: No discharge.     Conjunctiva/sclera: Conjunctivae normal.  Neck:     Musculoskeletal: Full passive range of motion without pain, normal range of motion and neck supple.     Thyroid: No thyromegaly.  Cardiovascular:     Rate and Rhythm: Normal rate and regular rhythm.     Pulses: Normal pulses.     Heart sounds: Normal heart sounds.  Pulmonary:     Effort: Pulmonary effort is normal.     Breath sounds: Normal breath sounds.  Musculoskeletal: Normal range of motion.  Lymphadenopathy:     Cervical: Cervical adenopathy present.     Right cervical: Superficial cervical adenopathy present. No deep or posterior cervical adenopathy.    Left cervical: No superficial, deep or posterior cervical adenopathy.  Skin:    General: Skin is warm.  Neurological:     General: No focal deficit present.     Mental Status: She is alert and oriented to person, place, and time.  Psychiatric:        Attention and Perception: Attention normal.        Mood and Affect: Mood normal.        Speech: Speech normal.        Behavior: Behavior normal. Behavior is cooperative.        Thought Content: Thought content normal.        Cognition and Memory: Cognition  normal.        Judgment: Judgment normal.        Assessment and Plan       1. Sore throat Strep neg, had tx last week. Throat still very red and tender and hurts to swallow. Given Nystatin to swish to see if that helps, as she reported some thrush like symptoms last week.  - POCT rapid strep A - nystatin (MYCOSTATIN) 100000 UNIT/ML suspension; Take 5 mLs (500,000 Units total) by mouth 4 (four) times daily.  Dispense: 60 mL; Refill: 0  2. Primary herpes simplex infection of oral region She has several small vesicle bumps across the tongue. Tender and hurts to touch, eat, and swallow.Has small cold sore  starting at corner of mouth. Will tx for HSV 1 and see if that helps.  - valACYclovir (VALTREX) 1000 MG tablet; Take 1 tablet (1,000 mg total) by mouth 2 (two) times daily for 7 days.  Dispense: 14 tablet; Refill: 0   3. Mild tongue swelling Mild swelling, noted on PE. Advised to take Benadryl for now, would like to avoid steroids if possible.      Follow Up: 12/26/2018  Perlie Mayo, DNP, AGNP-BC [facility], [address] [place], [place] [address] Office Hours: Mon-Thurs 8 am-5 pm; Fri 8 am-12 pm Office Phone:  [phone]  Office Fax: [phone]

## 2018-12-07 NOTE — Patient Instructions (Signed)
    I appreciate the opportunity to provide you with the care for your health and wellness. Today we discussed: sore throat and tongue  Follow up:  12/26/2018 or as needed  No labs or referrals today  Take medications as directed.  Use benadryl to help with tongue swelling. Please message on Mychart Friday to up date me on how you are feeling.  Avoid smoking at this time.  Keep warm liquids around to drink throughout day.  If you develop a fever or cough please call the office.  Please continue to practice social distancing to keep you, your family, and our community safe.  If you must go out, please wear a mask and practice good handwashing.  Bruno YOUR HANDS WELL AND FREQUENTLY. AVOID TOUCHING YOUR FACE, UNLESS YOUR HANDS ARE FRESHLY WASHED.  GET FRESH AIR DAILY. STAY HYDRATED WITH WATER.   It was a pleasure to see you and I look forward to continuing to work together on your health and well-being. Please do not hesitate to call the office if you need care or have questions about your care.  Have a wonderful day and week. With Gratitude, Cherly Beach, DNP, AGNP-BC

## 2018-12-15 MED FILL — AMLODIPINE BESYLATE 5 MG TA: 5 | 90 days supply | Qty: 90 | Fill #1

## 2018-12-15 MED FILL — DICLOFENAC SODIUM 75 MG TAB: 75 | 30 days supply | Qty: 30 | Fill #4

## 2018-12-22 ENCOUNTER — Other Ambulatory Visit: Payer: Self-pay

## 2018-12-22 ENCOUNTER — Encounter: Payer: Self-pay | Admitting: Family Medicine

## 2018-12-22 ENCOUNTER — Ambulatory Visit (INDEPENDENT_AMBULATORY_CARE_PROVIDER_SITE_OTHER): Payer: 59 | Admitting: Family Medicine

## 2018-12-22 VITALS — BP 148/90 | HR 74 | Temp 98.1°F | Ht 62.0 in | Wt 174.0 lb

## 2018-12-22 DIAGNOSIS — I10 Essential (primary) hypertension: Secondary | ICD-10-CM | POA: Diagnosis not present

## 2018-12-22 DIAGNOSIS — F17200 Nicotine dependence, unspecified, uncomplicated: Secondary | ICD-10-CM | POA: Diagnosis not present

## 2018-12-22 DIAGNOSIS — R7302 Impaired glucose tolerance (oral): Secondary | ICD-10-CM

## 2018-12-22 DIAGNOSIS — G8929 Other chronic pain: Secondary | ICD-10-CM

## 2018-12-22 DIAGNOSIS — R1319 Other dysphagia: Secondary | ICD-10-CM

## 2018-12-22 DIAGNOSIS — R220 Localized swelling, mass and lump, head: Secondary | ICD-10-CM | POA: Diagnosis not present

## 2018-12-22 DIAGNOSIS — R1310 Dysphagia, unspecified: Secondary | ICD-10-CM

## 2018-12-22 DIAGNOSIS — R682 Dry mouth, unspecified: Secondary | ICD-10-CM

## 2018-12-22 DIAGNOSIS — R490 Dysphonia: Secondary | ICD-10-CM

## 2018-12-22 DIAGNOSIS — E669 Obesity, unspecified: Secondary | ICD-10-CM

## 2018-12-22 DIAGNOSIS — M25572 Pain in left ankle and joints of left foot: Secondary | ICD-10-CM

## 2018-12-22 HISTORY — DX: Dysphagia, unspecified: R13.10

## 2018-12-22 LAB — POCT URINALYSIS DIP (CLINITEK)
Bilirubin, UA: NEGATIVE
Blood, UA: NEGATIVE
Glucose, UA: NEGATIVE mg/dL
Ketones, POC UA: NEGATIVE mg/dL
Nitrite, UA: NEGATIVE
POC PROTEIN,UA: NEGATIVE
Spec Grav, UA: 1.01 (ref 1.010–1.025)
Urobilinogen, UA: 0.2 E.U./dL
pH, UA: 6 (ref 5.0–8.0)

## 2018-12-22 LAB — GLUCOSE, POCT (MANUAL RESULT ENTRY): POC Glucose: 97 mg/dl (ref 70–99)

## 2018-12-22 MED ORDER — PREDNISONE 5 MG PO TABS: 5.0000 mg | ORAL_TABLET | Freq: Two times a day (BID) | ORAL | 0 refills | Status: AC

## 2018-12-22 MED FILL — predniSONE 5 MG TABS: 5 | 5 days supply | Qty: 10 | Fill #0

## 2018-12-22 NOTE — Progress Notes (Signed)
Zoe Bailey     MRN: [id number]      DOB: 1965/05/30   HPI Zoe Bailey is here for swollen dry tongue ,  Difficulty swallowing as if food stick in her throat , and pain on sides of tongue x 6 weeks, has had 2  Courses antibiotic  And 1 antiviral Notes extremely dry mouth which causes d difficulty swallowing, no saliva and has adx of dry eyes ROS Denies recent fever or chills. Denies sinus pressure, nasal congestion, ear pain or sore throat. Denies chest congestion, productive cough or wheezing. Denies chest pains, palpitations and leg swelling Denies abdominal pain, nausea, vomiting,diarrhea or constipation.  C/o dysphagia in upper  Denies dysuria, frequency, hesitancy or incontinence. Denies joint pain, swelling and limitation in mobility. Denies headaches, seizures, numbness, or tingling. Denies depression, anxiety or insomnia.    PE  BP (!) 148/90   Pulse 74   Temp 98.1 F (36.7 C) (Temporal)   Ht 5\' 2"  (1.575 m)   Wt 174 lb (78.9 kg)   SpO2 98%   BMI 31.83 kg/m   Patient alert and oriented and in no cardiopulmonary distress.  HEENT: No facial asymmetry, EOMI,     Neck supple .Oropharynx dry, erythema and swelling of tongue  Chest: Clear to auscultation bilaterally.  CVS: S1, S2 no murmurs, no S3.Regular rate.  ABD: Soft non tender.   Ext: No edema  MS: Adequate ROM spine, shoulders, hips and knees.Decreased ROM left ankle Skin: Intact, ulcers at corners of mouth, erythema of tongue  Psych: Good eye contact, normal affect. Memory intact not anxious or depressed appearing.  CNS: CN 2-12 intact, power,  normal throughout.no focal deficits noted.   Assessment & Plan  Dry mouth Biotene mouthwash prescribed  Swollen tongue Short course of oral prednisone prescribed  Hoarseness, chronic Refer to ENT to re evaluate, chronic smoker and uncontrolled GERD  Dysphagia Solid dysphagia with choking , refer to GI, uncontrolled gERD  NICOTINE ADDICTION Asked:confirms  currently smokes cigarettes Assess: Unwilling to quit but cutting back Advise: needs to QUIT to reduce risk of cancer, cardio and cerebrovascular disease Assist: counseled for 5 minutes and literature provided Arrange: follow up in 3 months   Obesity (BMI 30.0-34.9)  Patient re-educated about  the importance of commitment to a  minimum of 150 minutes of exercise per week as able.  The importance of healthy food choices with portion control discussed, as well as eating regularly and within a 12 hour window most days. The need to choose "clean , green" food 50 to 75% of the time is discussed, as well as to make water the primary drink and set a goal of 64 ounces water daily.    Weight /BMI 12/22/2018 12/07/2018 11/10/2018  WEIGHT 174 lb 174 lb 0.6 oz 174 lb  HEIGHT 5\' 2"  5\' 2"  5\' 2"   BMI 31.83 kg/m2 31.83 kg/m2 31.83 kg/m2      Essential hypertension Uncontrolled, medication adjustment needed, but will await repeat exam as reports normal BP outside of the office DASH diet and commitment to daily physical activity for a minimum of 30 minutes discussed and encouraged, as a part of hypertension management. The importance of attaining a healthy weight is also discussed.  BP/Weight 12/22/2018 12/07/2018 11/10/2018 10/10/2018 09/19/2018 06/13/2018 123456  Systolic BP 123456 Q000111Q 123456 123456 [id number]  Diastolic BP 90 86 82 76 96 80 84  Wt. (Lbs) 174 174.04 174 178.12 179 175 173  BMI 31.83 31.83 31.83 32.58 32.74  32.01 31.64        

## 2018-12-22 NOTE — Patient Instructions (Addendum)
F/u to be changed from 10/19 for pt to return mid to end November, call if you need me sooner  For your swollen tongue, I am prescribing a short course of prednisone  Biotene mouthwash will be prescribed for dry mouth  You are referred to Ortho of your choice re left ankle pain and swelling  For difficulty swallowing with food sticking in throat , I am  referring you to GI [facility],  For reflux with hoarseness, and dry mouth I am referring you to ENT  I will be working on finding out why you have such a dry mouth, and get back to you  Need to check blood sugar , and also may need to look further re cause of this very disabling dry tongue, medication wil be prescribed to help the symptom  Today your urine and a fingerstick blood sample will be checked  Re your blood sugar staus

## 2018-12-26 ENCOUNTER — Ambulatory Visit: Payer: 59 | Admitting: Family Medicine

## 2018-12-26 MED ORDER — BIOTENE DRY MOUTH MT LIQD: OROMUCOSAL | 1 refills | Status: DC

## 2018-12-29 ENCOUNTER — Encounter: Payer: Self-pay | Admitting: Family Medicine

## 2018-12-29 DIAGNOSIS — R220 Localized swelling, mass and lump, head: Secondary | ICD-10-CM | POA: Insufficient documentation

## 2018-12-29 NOTE — Assessment & Plan Note (Signed)
Asked:confirms currently smokes cigarettes Assess: Unwilling to quit but cutting back Advise: needs to QUIT to reduce risk of cancer, cardio and cerebrovascular disease Assist: counseled for 5 minutes and literature provided Arrange: follow up in 3 months  

## 2018-12-29 NOTE — Assessment & Plan Note (Signed)
Solid dysphagia with choking , refer to GI, uncontrolled gERD

## 2018-12-29 NOTE — Assessment & Plan Note (Signed)
  Patient re-educated about  the importance of commitment to a  minimum of 150 minutes of exercise per week as able.  The importance of healthy food choices with portion control discussed, as well as eating regularly and within a 12 hour window most days. The need to choose "clean , green" food 50 to 75% of the time is discussed, as well as to make water the primary drink and set a goal of 64 ounces water daily.    Weight /BMI 12/22/2018 12/07/2018 11/10/2018  WEIGHT 174 lb 174 lb 0.6 oz 174 lb  HEIGHT 5\' 2"  5\' 2"  5\' 2"   BMI 31.83 kg/m2 31.83 kg/m2 31.83 kg/m2

## 2018-12-29 NOTE — Assessment & Plan Note (Signed)
Biotene mouthwash prescribed

## 2018-12-29 NOTE — Assessment & Plan Note (Signed)
Short course of oral prednisone prescribed

## 2018-12-29 NOTE — Assessment & Plan Note (Signed)
Refer to ENT to re evaluate, chronic smoker and uncontrolled GERD

## 2018-12-29 NOTE — Assessment & Plan Note (Signed)
Uncontrolled, medication adjustment needed, but will await repeat exam as reports normal BP outside of the office DASH diet and commitment to daily physical activity for a minimum of 30 minutes discussed and encouraged, as a part of hypertension management. The importance of attaining a healthy weight is also discussed.  BP/Weight 12/22/2018 12/07/2018 11/10/2018 10/10/2018 09/19/2018 06/13/2018 123456  Systolic BP 123456 Q000111Q 123456 123456 [id number] [id number]  Diastolic BP 90 86 82 76 96 80 84  Wt. (Lbs) 174 174.04 174 178.12 179 175 173  BMI 31.83 31.83 31.83 32.58 32.74 32.01 31.64

## 2019-01-02 ENCOUNTER — Encounter (INDEPENDENT_AMBULATORY_CARE_PROVIDER_SITE_OTHER): Payer: Self-pay | Admitting: Internal Medicine

## 2019-01-04 ENCOUNTER — Encounter (INDEPENDENT_AMBULATORY_CARE_PROVIDER_SITE_OTHER): Payer: Self-pay | Admitting: Nurse Practitioner

## 2019-01-04 ENCOUNTER — Other Ambulatory Visit: Payer: Self-pay

## 2019-01-04 ENCOUNTER — Ambulatory Visit (INDEPENDENT_AMBULATORY_CARE_PROVIDER_SITE_OTHER): Payer: 59 | Admitting: Nurse Practitioner

## 2019-01-04 DIAGNOSIS — R1311 Dysphagia, oral phase: Secondary | ICD-10-CM | POA: Diagnosis not present

## 2019-01-04 DIAGNOSIS — K219 Gastro-esophageal reflux disease without esophagitis: Secondary | ICD-10-CM | POA: Diagnosis not present

## 2019-01-04 MED ORDER — FAMOTIDINE 20 MG PO TABS: 20.0000 mg | ORAL_TABLET | Freq: Two times a day (BID) | ORAL | 1 refills | Status: DC

## 2019-01-04 MED FILL — FAMOTIDINE 20 MG TABLET: 20 | 30 days supply | Qty: 60 | Fill #0

## 2019-01-04 NOTE — Progress Notes (Addendum)
Subjective:    Patient ID: Zoe Bailey, female    DOB: 04-03-1965, 53 y.o.   MRN: [id number]  HPI patient with a past medical history of arthritis, migraine headaches, depression, COPD, sleep apnea and hypertension. She complains of having a sore throat for the past 2-1/2 months.  She also has a dry mouth.  She initially started to gargle with salt water without improvement.  She then noticed her tongue felt bigger and swollen.  She also developed sores to the side of her tongue.  She was seen by her PCP and she was prescribed Amoxicillin for 3 days.  Her symptoms continued and she was then prescribed Penicillin twice daily for approximately 5 days.  She continued to have intermittent swelling to her tongue without any respiratory distress.  She went back to see her PCP and she was found to have oral thrush and was treated with nystatin for 7 days.  She continues to have a sore throat.  She complains of having difficulty swallowing, she feels choked at times.  She describes food gets stuck in her throat which is occurred intermittently for the past 2 months.  She is now having dysphagia symptoms daily.  She gets choked on water but she can swallow Dr. Malachi Bonds without difficulty.  She has frequent heartburn.  She has been taking Pantoprazole 20 mg daily for the past 3 months.  She also started taking Amlodipine for hypertension at the same time.  She has a history of migraine headaches and her migraine headaches have worsened over the past 3 months.  She chronically takes [place] powder for many years.  She took 18 packets of Goody powder for 5 to 6 years.  For the past 2-1/2 years she is taking 4 packets daily.  She is on Gabapentin and Botox injections for her migraine headaches. She stated if her migraine headache specialist knows she is taking Goody powder they will no longer treat her headaches.  Is also having upper abdominal pain for past 6 to 7 months.  She has chronic loose stools described as mud-like.   She will past 6-7 loose stools daily.  Last night she was awakened during the middle the night and passed multiple loose stools.  Several years ago she had chronic constipation.  Her migraine headaches and loose stools have worsened since she started Protonix.  Her episodes of tongue swelling started approximately 4 weeks after she started amlodipine and Protonix.  She underwent a colonoscopy 05/07/2017 which identified diverticulosis to the sigmoid colon and external hemorrhoids.  She was advised by Dr. Laural Golden to repeat a colonoscopy in 10 years.  No alcohol use.  Past Medical History:  Diagnosis Date   Arthritis    Cancer ([place]) 2010 approx   skin cancer   Complication of anesthesia    Wakes up during all surgeries   COPD (chronic obstructive pulmonary disease) ([place])    Depression    Foot pain, right 03/2014   Migraine    USES BOTOX FOR TREATMENT    Shortness of breath dyspnea    Sleep apnea    Toenail fungus    Past Surgical History:  Procedure Laterality Date   ANKLE FRACTURE SURGERY Right    BREAST ENHANCEMENT SURGERY     CARPAL TUNNEL RELEASE Right    x2   CHONDROPLASTY Right 02/17/2018   Procedure: CHONDROPLASTY;  Surgeon: Paralee Cancel, MD;  [facility];  Service: Orthopedics;  Laterality: Right;   COLONOSCOPY WITH PROPOFOL N/A 05/07/2017  Procedure: COLONOSCOPY WITH PROPOFOL;  Surgeon: Rogene Houston, MD;  [facility];  Service: Endoscopy;  Laterality: N/A;  8:30   HERNIA REPAIR     umbilical   KNEE ARTHROSCOPY WITH MEDIAL MENISECTOMY Right 02/17/2018   Procedure: KNEE ARTHROSCOPY WITH MEDIAL MENISECTOMY;  Surgeon: Paralee Cancel, MD;  [facility];  Service: Orthopedics;  Laterality: Right;   NASAL POLYP SURGERY     POLYPECTOMY     Current Outpatient Medications on File Prior to Visit  Medication Sig Dispense Refill   amLODipine (NORVASC) 5 MG tablet Take 1 tablet (5 mg total) by mouth daily. 90 tablet 3   buPROPion (WELLBUTRIN  XL) 300 MG 24 hr tablet TAKE 1 TABLET BY MOUTH DAILY. 90 tablet 3   busPIRone (BUSPAR) 10 MG tablet Take 1 tablet (10 mg total) by mouth 3 (three) times daily. (Patient taking differently: Take 10 mg by mouth daily as needed (anxiety). ) 90 tablet 3   cyclobenzaprine (FLEXERIL) 10 MG tablet TAKE 1 TABLET BY MOUTH 3 TIMES DAILY AS NEEDED FOR MUSCLE SPASMS. 30 tablet 3   diclofenac (VOLTAREN) 75 MG EC tablet TAKE 1 TABLET BY MOUTH ONCE DAILY AS NEEDED FOR PAIN 30 tablet 5   gabapentin (NEURONTIN) 400 MG capsule Take two capsules by mouth every morning for migraine prevention 180 capsule 3   Multiple Vitamin (MULTIVITAMIN WITH MINERALS) TABS tablet Take 2 tablets by mouth daily.      OnabotulinumtoxinA (BOTOX IM) Inject 1 Dose into the muscle every 3 (three) months.     pantoprazole (PROTONIX) 20 MG tablet Take 1 tablet (20 mg total) by mouth daily. 90 tablet 1   phentermine (ADIPEX-P) 37.5 MG tablet Take 1 tablet (37.5 mg total) by mouth daily before breakfast. 30 tablet 3   triamterene-hydrochlorothiazide (MAXZIDE) 75-50 MG tablet Take 1 tablet by mouth daily. 90 tablet 3   Turmeric (QC TUMERIC COMPLEX) 500 MG CAPS Take 4 capsules by mouth daily.     UNABLE TO FIND Blood pressure monitor x 1  DX I10 1 each 0   antiseptic oral rinse (BIOTENE) LIQD Swish and spit 15 ml between 2 to 3 times daily as needed, for dry mouth (Patient not taking: Reported on 01/04/2019) 1000 mL 1   tiotropium (SPIRIVA HANDIHALER) 18 MCG inhalation capsule Place 1 capsule (18 mcg total) into inhaler and inhale daily for 30 days. (Patient not taking: Reported on 01/04/2019) 30 capsule 3   No current facility-administered medications on file prior to visit.    Allergies  Allergen Reactions   Effexor [Venlafaxine] Other (See Comments)    Decreased libido   Other     Pt has a fear of tourniquets   Review of Systems see HPI, all other systems reviewed and are negative     Objective:   Physical Exam  BP  130/85    Pulse 96    Temp 98.9 F (37.2 C) (Oral)    Ht 5\' 2"  (1.575 m)    Wt 173 lb (78.5 kg)    BMI 31.64 kg/m   General: 53 year old female in no acute distress Eyes: Sclera nonicteric, conjunctiva pink Mouth: Few white patches to the back of her tongue, posterior pharynx is pink Neck: Supple Heart: Regular rate and rhythm, no murmurs Lungs: Breath sounds clear throughout Abdomen: Mild epigastric tenderness, soft, positive bowel sounds to all 4 quadrants, no HSM Extremities: No edema Neuro: Alert and oriented x4, no focal deficits    Assessment & Plan:   1.  53 year old female with oral phase dysphagia, probable laryngeal reflux in setting of excessive Goody powder use -EGD benefits and risk discussed include risk with sedation, risk of bleeding, perforation and infection -Stop pantoprazole -Start famotidine 20 mg 1 p.o. twice daily  2.  History of  tongue swelling which started approximately 4 weeks after initiating amlodipine and pantoprazole -She is scheduled to see ENT -If tongue swelling continues Amlodipine may need to be discontinued as well -Patient aware to go to the emergency room if she has swelling to her tongue, lips, face or any respiratory distress

## 2019-01-04 NOTE — Patient Instructions (Signed)
1.  Stop taking Goody powder  2.  Stop pantoprazole  Read.  Start famotidine 20 mg 1 tab by mouth twice daily  4.  Schedule an EGD  5.  Proceed with your ENT evaluation  6.  If you develop swelling to your lips, tongue or face and or if you develop difficulty breathing call 1 1 present to the local emergency room  7.  If you continue to have episodes of tongue swelling after you stop the pantoprazole I would recommend that you contact your primary care physician to discuss stopping amlodipine as we discussed in the office today

## 2019-01-09 ENCOUNTER — Ambulatory Visit (INDEPENDENT_AMBULATORY_CARE_PROVIDER_SITE_OTHER): Payer: 59 | Admitting: Orthopedic Surgery

## 2019-01-09 ENCOUNTER — Ambulatory Visit: Payer: 59

## 2019-01-09 ENCOUNTER — Other Ambulatory Visit: Payer: Self-pay

## 2019-01-09 ENCOUNTER — Encounter: Payer: Self-pay | Admitting: Orthopedic Surgery

## 2019-01-09 VITALS — BP 135/100 | HR 112 | Ht 62.0 in | Wt 170.0 lb

## 2019-01-09 DIAGNOSIS — M25572 Pain in left ankle and joints of left foot: Secondary | ICD-10-CM

## 2019-01-09 DIAGNOSIS — M76829 Posterior tibial tendinitis, unspecified leg: Secondary | ICD-10-CM

## 2019-01-09 DIAGNOSIS — G8929 Other chronic pain: Secondary | ICD-10-CM

## 2019-01-09 DIAGNOSIS — M2142 Flat foot [pes planus] (acquired), left foot: Secondary | ICD-10-CM

## 2019-01-09 NOTE — Progress Notes (Signed)
Zoe Bailey  01/09/2019  HISTORY SECTION :  Chief Complaint  Patient presents with  . Ankle Pain    left for four months, no recent injury    53 year old female presents with pain and swelling left ankle no injury.  Pain has been present for 4 months lateral side of the ankle just beneath the fibula with 10 out of 10 pain when walking unrelieved by recent orthotics obtained from podiatry associated with giving way episodes of the ankle and some pain also on the right side   Review of Systems  Musculoskeletal: Positive for joint pain.       Right knee  All other systems reviewed and are negative.    has a past medical history of Arthritis, Cancer ([place]) ([id number] approx), Complication of anesthesia, COPD (chronic obstructive pulmonary disease) ([place]), Depression, Foot pain, right (03/2014), Migraine, Shortness of breath dyspnea, Sleep apnea, and Toenail fungus.   Past Surgical History:  Procedure Laterality Date  . ANKLE FRACTURE SURGERY Right   . BREAST ENHANCEMENT SURGERY    . CARPAL TUNNEL RELEASE Right    x2  . CHONDROPLASTY Right 02/17/2018   Procedure: CHONDROPLASTY;  Surgeon: Paralee Cancel, MD;  [facility];  Service: Orthopedics;  Laterality: Right;  . COLONOSCOPY WITH PROPOFOL N/A 05/07/2017   Procedure: COLONOSCOPY WITH PROPOFOL;  Surgeon: Rogene Houston, MD;  [facility];  Service: Endoscopy;  Laterality: N/A;  8:30  . HERNIA REPAIR     umbilical  . KNEE ARTHROSCOPY WITH MEDIAL MENISECTOMY Right 02/17/2018   Procedure: KNEE ARTHROSCOPY WITH MEDIAL MENISECTOMY;  Surgeon: Paralee Cancel, MD;  [facility];  Service: Orthopedics;  Laterality: Right;  . NASAL POLYP SURGERY    . POLYPECTOMY      Body mass index is 31.09 kg/m.   Allergies  Allergen Reactions  . Effexor [Venlafaxine] Other (See Comments)    Decreased libido  . Other     Pt has a fear of tourniquets     Current Outpatient Medications:  .  amLODipine (NORVASC) 5 MG tablet, Take 1  tablet (5 mg total) by mouth daily., Disp: 90 tablet, Rfl: 3 .  antiseptic oral rinse (BIOTENE) LIQD, Swish and spit 15 ml between 2 to 3 times daily as needed, for dry mouth, Disp: 1000 mL, Rfl: 1 .  buPROPion (WELLBUTRIN XL) 300 MG 24 hr tablet, TAKE 1 TABLET BY MOUTH DAILY., Disp: 90 tablet, Rfl: 3 .  busPIRone (BUSPAR) 10 MG tablet, Take 1 tablet (10 mg total) by mouth 3 (three) times daily. (Patient taking differently: Take 10 mg by mouth daily as needed (anxiety). ), Disp: 90 tablet, Rfl: 3 .  cyclobenzaprine (FLEXERIL) 10 MG tablet, TAKE 1 TABLET BY MOUTH 3 TIMES DAILY AS NEEDED FOR MUSCLE SPASMS., Disp: 30 tablet, Rfl: 3 .  diclofenac (VOLTAREN) 75 MG EC tablet, TAKE 1 TABLET BY MOUTH ONCE DAILY AS NEEDED FOR PAIN, Disp: 30 tablet, Rfl: 5 .  famotidine (PEPCID) 20 MG tablet, Take 1 tablet (20 mg total) by mouth 2 (two) times daily., Disp: 60 tablet, Rfl: 1 .  gabapentin (NEURONTIN) 400 MG capsule, Take two capsules by mouth every morning for migraine prevention, Disp: 180 capsule, Rfl: 3 .  Multiple Vitamin (MULTIVITAMIN WITH MINERALS) TABS tablet, Take 2 tablets by mouth daily. , Disp: , Rfl:  .  OnabotulinumtoxinA (BOTOX IM), Inject 1 Dose into the muscle every 3 (three) months., Disp: , Rfl:  .  pantoprazole (PROTONIX) 20 MG tablet, Take 1 tablet (20  mg total) by mouth daily., Disp: 90 tablet, Rfl: 1 .  phentermine (ADIPEX-P) 37.5 MG tablet, Take 1 tablet (37.5 mg total) by mouth daily before breakfast., Disp: 30 tablet, Rfl: 3 .  tiotropium (SPIRIVA HANDIHALER) 18 MCG inhalation capsule, Place 1 capsule (18 mcg total) into inhaler and inhale daily for 30 days., Disp: 30 capsule, Rfl: 3 .  triamterene-hydrochlorothiazide (MAXZIDE) 75-50 MG tablet, Take 1 tablet by mouth daily., Disp: 90 tablet, Rfl: 3 .  Turmeric (QC TUMERIC COMPLEX) 500 MG CAPS, Take 4 capsules by mouth daily., Disp: , Rfl:  .  UNABLE TO FIND, Blood pressure monitor x 1  DX I10, Disp: 1 each, Rfl: 0   PHYSICAL EXAM  SECTION: 1) BP (!) 135/100   Pulse (!) 112   Ht 5\' 2"  (1.575 m)   Wt 170 lb (77.1 kg)   BMI 31.09 kg/m   Body mass index is 31.09 kg/m. General appearance: Well-developed well-nourished no gross deformities  2) Cardiovascular normal pulse and perfusion in the lower extremities normal color without edema  3) Neurologically deep tendon reflexes are equal and normal, no sensation loss or deficits no pathologic reflexes  4) Psychological: Awake alert and oriented x3 mood and affect normal  5) Skin no lacerations or ulcerations no nodularity no palpable masses, no erythema or nodularity  6) Musculoskeletal:   Both feet are in pes planus flexible but severe flatfoot deformities  On the left side we find tenderness in the subtalar joint with swelling and prominence of the subtalar fat Ankle range of motion is normal Ankle feels stable To many toes sign is noted Partial correction of valgus of the heel on tiptoe testing is noted Posterior tibial tendon tenderness is also observed  Right side normal range of motion of the ankle stability tests were normal positive too many toes sign flexible hindfoot on tiptoe no weakness of the posterior tibial tendon  X-ray of the ankle is normal no arthritis   MEDICAL DECISION SECTION:  Encounter Diagnoses  Name Primary?  . Chronic pain of left ankle Yes  . PTTD (posterior tibial tendon dysfunction)   . Pes planus of left foot     Plan:  (Rx., Inj., surg., Frx, MRI/CT, XR:2)  Offered injection subtalar joint but she declined recommended [facility] foot and ankle surgeon for further management and consideration for other nonoperative measures versus surgical repair and reconstruction  11:31 AM Arther Abbott, MD  01/09/2019

## 2019-01-12 ENCOUNTER — Ambulatory Visit: Payer: 59 | Admitting: Family Medicine

## 2019-01-16 ENCOUNTER — Encounter: Payer: Self-pay | Admitting: Orthopedic Surgery

## 2019-01-16 ENCOUNTER — Other Ambulatory Visit: Payer: Self-pay

## 2019-01-16 ENCOUNTER — Ambulatory Visit: Payer: 59 | Admitting: Orthopedic Surgery

## 2019-01-16 DIAGNOSIS — M7672 Peroneal tendinitis, left leg: Secondary | ICD-10-CM

## 2019-01-16 NOTE — Progress Notes (Signed)
Office Visit Note   Patient: Zoe Bailey           Date of Birth: 01-29-1966           MRN: [id number] Visit Date: 01/16/2019              Requested by: Carole Civil, MD [address] [place],  [place] [address] PCP: Fayrene Helper, MD  No chief complaint on file.     HPI: Patient is a 53 year old woman who is seen for initial evaluation for posterior lateral left ankle pain.  She states she broke her ankle about 4 years ago but is having pain for the past 4 months without trauma.  Patient has tried custom orthotics she states this makes her foot feel better but she still has posterior lateral left ankle pain.  She complains of weakness in her ankle her ankle giving way she states she has had several falls secondary to this.  Assessment & Plan: Visit Diagnoses:  1. Peroneal tendonitis, left     Plan: We will plan to get an MRI scan to further evaluate the peroneal tendon integrity.  Patient may need anterior talofibular ligament reconstruction may need peroneal tendon reconstruction.  Follow-Up Instructions: Return in about 2 weeks (around 01/30/2019).   Ortho Exam  Patient is alert, oriented, no adenopathy, well-dressed, normal affect, normal respiratory effort. Examination patient has good pulses.  She has a positive anterior drawer the left about 2 mm with more laxity than the right with a anterior clunk.  She is nontender to palpation anteriorly over the ankle joint nontender to palpation over the sinus Tarsi.  She does have tenderness to palpation and swelling over the peroneal tendons on the left.  She has pes planus bilaterally and a normal gait bilaterally.  Imaging: No results found. No images are attached to the encounter.  Labs: Lab Results  Component Value Date   HGBA1C 6.0 (H) 09/16/2018   HGBA1C 5.2 01/26/2018   HGBA1C 5.9 (H) 04/12/2017   ESRSEDRATE 14 05/11/2014   CRP <0.5 (L) 05/11/2014   REPTSTATUS 09/20/2018 FINAL 09/19/2018   CULT (A)  09/19/2018    <10,000 COLONIES/mL INSIGNIFICANT GROWTH Performed at [facility], [place] [address], [place], [place] [address]    [place] 12/26/2015     Lab Results  Component Value Date   ALBUMIN 4.0 09/16/2018   ALBUMIN 3.9 04/12/2017   ALBUMIN 4.1 06/11/2015    No results found for: MG Lab Results  Component Value Date   VD25OH 25.1 (L) 09/16/2018   VD25OH 29.5 (L) 04/12/2017   VD25OH 25.4 (L) 04/10/2016    No results found for: PREALBUMIN CBC EXTENDED Latest Ref Rng & Units 09/16/2018 02/10/2018 04/12/2017  WBC 4.0 - 10.5 K/uL 10.2 11.2(H) 9.9  RBC 3.87 - 5.11 MIL/uL 4.58 4.31 4.50  HGB 12.0 - 15.0 g/dL 14.1 13.7 14.0  HCT 36.0 - 46.0 % 42.8 41.3 42.5  PLT 150 - 400 K/uL 219 206 241  NEUTROABS 1.7 - 7.7 K/uL - - -  LYMPHSABS 0.7 - 4.0 K/uL - - -     There is no height or weight on file to calculate BMI.  Orders:  Orders Placed This Encounter  Procedures  . MR Ankle Left w/o contrast   No orders of the defined types were placed in this encounter.    Procedures: No procedures performed  Clinical Data: No additional findings.  ROS:  All other systems negative, except as  noted in the HPI. Review of Systems  Objective: Vital Signs: There were no vitals taken for this visit.  Specialty Comments:  No specialty comments available.  PMFS History: Patient Active Problem List   Diagnosis Date Noted  . GERD (gastroesophageal reflux disease) 01/04/2019  . Swollen tongue 12/29/2018  . Dry mouth 12/22/2018  . Dysphagia 12/22/2018  . Hoarseness, chronic 12/22/2018  . Need for zoster vaccine 09/22/2018  . Hemorrhoid 08/11/2017  . Sigmoid diverticulosis 08/11/2017  . Essential hypertension 02/28/2015  . Joint pain 02/28/2015  . Easy bruising 02/19/2014  . Menometrorrhagia 07/14/2013  . Polymenorrhea 07/11/2013  . GAD (generalized anxiety disorder) 01/04/2013  . CMC arthritis, thumb, degenerative 04/02/2011  . TFCC (triangular  fibrocartilage complex) injury 04/02/2011  . Obesity (BMI 30.0-34.9) 10/22/2009  . Depression with anxiety 10/22/2009  . NICOTINE ADDICTION 10/09/2008  . Migraine 02/24/2007   Past Medical History:  Diagnosis Date  . Arthritis   . Cancer ([place]) 2010 approx   skin cancer  . Complication of anesthesia    Wakes up during all surgeries  . COPD (chronic obstructive pulmonary disease) ([place])   . Depression   . Foot pain, right 03/2014  . Migraine    USES BOTOX FOR TREATMENT   . Shortness of breath dyspnea   . Sleep apnea   . Toenail fungus     Family History  Problem Relation Age of Onset  . Arthritis Unknown   . Cancer Unknown     Past Surgical History:  Procedure Laterality Date  . ANKLE FRACTURE SURGERY Right   . BREAST ENHANCEMENT SURGERY    . CARPAL TUNNEL RELEASE Right    x2  . CHONDROPLASTY Right 02/17/2018   Procedure: CHONDROPLASTY;  Surgeon: Paralee Cancel, MD;  [facility];  Service: Orthopedics;  Laterality: Right;  . COLONOSCOPY WITH PROPOFOL N/A 05/07/2017   Procedure: COLONOSCOPY WITH PROPOFOL;  Surgeon: Rogene Houston, MD;  [facility];  Service: Endoscopy;  Laterality: N/A;  8:30  . HERNIA REPAIR     umbilical  . KNEE ARTHROSCOPY WITH MEDIAL MENISECTOMY Right 02/17/2018   Procedure: KNEE ARTHROSCOPY WITH MEDIAL MENISECTOMY;  Surgeon: Paralee Cancel, MD;  [facility];  Service: Orthopedics;  Laterality: Right;  . NASAL POLYP SURGERY    . POLYPECTOMY     Social History   Occupational History  . Occupation: Electrical engineer: [employer]  Tobacco Use  . Smoking status: Current Every Day Smoker    Packs/day: 0.25    Years: 33.00    Pack years: 8.25  . Smokeless tobacco: Never Used  Substance and Sexual Activity  . Alcohol use: No    Alcohol/week: 0.0 standard drinks  . Drug use: No  . Sexual activity: Yes    Birth control/protection: None

## 2019-01-19 MED FILL — TRIAMTERENE/HCTZ 75/50 TAB: 75-50 | 90 days supply | Qty: 90 | Fill #1

## 2019-01-19 MED FILL — PHENTERMINE 37.5 MG TABLET: 37.5 | 30 days supply | Qty: 30 | Fill #3

## 2019-01-19 MED FILL — DICLOFENAC SODIUM 75 MG TAB: 75 | 30 days supply | Qty: 30 | Fill #5

## 2019-01-19 MED FILL — buPROPion HCL ER (XL) 300 M: 300 | 90 days supply | Qty: 90 | Fill #1

## 2019-01-24 ENCOUNTER — Other Ambulatory Visit (INDEPENDENT_AMBULATORY_CARE_PROVIDER_SITE_OTHER): Payer: Self-pay | Admitting: *Deleted

## 2019-01-24 ENCOUNTER — Encounter (INDEPENDENT_AMBULATORY_CARE_PROVIDER_SITE_OTHER): Payer: Self-pay | Admitting: *Deleted

## 2019-01-24 DIAGNOSIS — R1311 Dysphagia, oral phase: Secondary | ICD-10-CM

## 2019-01-24 HISTORY — DX: Dysphagia, oral phase: R13.11

## 2019-01-25 ENCOUNTER — Other Ambulatory Visit (INDEPENDENT_AMBULATORY_CARE_PROVIDER_SITE_OTHER): Payer: Self-pay | Admitting: *Deleted

## 2019-01-25 ENCOUNTER — Other Ambulatory Visit: Payer: Self-pay

## 2019-01-25 ENCOUNTER — Encounter: Payer: Self-pay | Admitting: Family Medicine

## 2019-01-25 ENCOUNTER — Ambulatory Visit (INDEPENDENT_AMBULATORY_CARE_PROVIDER_SITE_OTHER): Payer: 59 | Admitting: Family Medicine

## 2019-01-25 VITALS — BP 130/85 | Wt 170.0 lb

## 2019-01-25 DIAGNOSIS — F418 Other specified anxiety disorders: Secondary | ICD-10-CM | POA: Diagnosis not present

## 2019-01-25 DIAGNOSIS — R490 Dysphonia: Secondary | ICD-10-CM

## 2019-01-25 DIAGNOSIS — Z23 Encounter for immunization: Secondary | ICD-10-CM | POA: Diagnosis not present

## 2019-01-25 DIAGNOSIS — E669 Obesity, unspecified: Secondary | ICD-10-CM | POA: Diagnosis not present

## 2019-01-25 DIAGNOSIS — I10 Essential (primary) hypertension: Secondary | ICD-10-CM | POA: Diagnosis not present

## 2019-01-25 DIAGNOSIS — R7301 Impaired fasting glucose: Secondary | ICD-10-CM

## 2019-01-25 DIAGNOSIS — F17200 Nicotine dependence, unspecified, uncomplicated: Secondary | ICD-10-CM

## 2019-01-25 DIAGNOSIS — K219 Gastro-esophageal reflux disease without esophagitis: Secondary | ICD-10-CM | POA: Diagnosis not present

## 2019-01-25 NOTE — Progress Notes (Signed)
Virtual Visit via Telephone Note  I connected with Zoe Bailey on 01/25/19 at  4:00 PM EST by telephone and verified that I am speaking with the correct person using two identifiers.  Location: Patient: truck Provider: office   I discussed the limitations, risks, security and privacy concerns of performing an evaluation and management service by telephone and the availability of in person appointments. I also discussed with the patient that there may be a patient responsible charge related to this service. The patient expressed understanding and agreed to proceed.   History of Present Illness: F/U acute and chronic problems Still has sores in  Mouth and reports some dysphagia for solids. Has upcoming ENT and GI appointments to address both Reports lack of regular commitment to exercise and  Healthy diet , but ios woring on both Denies recent fever or chills. Denies sinus pressure, nasal congestion, ear pain or sore throat. Denies chest congestion, productive cough or wheezing. Denies chest pains, palpitations and leg swelling Denies abdominal pain, nausea, vomiting,diarrhea or constipation.   Denies dysuria, frequency, hesitancy or incontinence. Denies joint pain, swelling and limitation in mobility. Denies headaches, seizures, numbness, or tingling. Denies depression, still has uncontrolled anxiety but take buspar only once daily despite being prescribed 3 times daily, will up titrate to twice daily .       Observations/Objective: BP 130/85   Wt 170 lb (77.1 kg)   BMI 31.09 kg/m  Good communication with no confusion and intact memory. Alert and oriented x 3 No signs of respiratory distress during speech    Assessment and Plan:  GERD (gastroesophageal reflux disease) Increased symptoms suggestive of poor control, including dysphagia, GI to evaluate in the near future  Hoarseness, chronic eNT to  Re evluate in particular c/o mouth sores  NICOTINE  ADDICTION Asked:confirms currently smokes cigarettes Assess: Unwilling to quit but cutting back Advise: needs to QUIT to reduce risk of cancer, cardio and cerebrovascular disease Assist: counseled for 5 minutes and literature provided Arrange: follow up in 3 months   Depression with anxiety Depression currently controlled, needs to increease buspar to address anxiety  Need for zoster vaccine Needs to come in for 2nd zoster   Obesity (BMI 30.0-34.9)  Patient re-educated about  the importance of commitment to a  minimum of 150 minutes of exercise per week as able.  The importance of healthy food choices with portion control discussed, as well as eating regularly and within a 12 hour window most days. The need to choose "clean , green" food 50 to 75% of the time is discussed, as well as to make water the primary drink and set a goal of 64 ounces water daily.    Weight /BMI 01/25/2019 01/09/2019 01/04/2019  WEIGHT 170 lb 170 lb 173 lb  HEIGHT - 5\' 2"  5\' 2"   BMI 31.09 kg/m2 31.09 kg/m2 31.64 kg/m2       Follow Up Instructions:    I discussed the assessment and treatment plan with the patient. The patient was provided an opportunity to ask questions and all were answered. The patient agreed with the plan and demonstrated an understanding of the instructions.   The patient was advised to call back or seek an in-person evaluation if the symptoms worsen or if the condition fails to improve as anticipated.  I provided 25 minutes of non-face-to-face time during this encounter.   Tula Nakayama, MD

## 2019-01-25 NOTE — Patient Instructions (Addendum)
F/u in office with MD ifirst week in April, re evaluate weight , blood pressure , call if you need me sooner  Continue curerent medication, but increase the way you are taking buspar to one twice daily  Please commit to quitting smoking and continue to work at it  Please get fasting lipid, cmp and eGFr, hBa1C 1 week before follow up  Thanks for choosing [facility], we consider it a privelige to serve you.   Weight loss goal of 10 pounds  It is important that you exercise regularly at least 30 minutes 5 times a week. If you develop chest pain, have severe difficulty breathing, or feel very tired, stop exercising immediately and seek medical attention   Think about what you will eat, plan ahead. Choose " clean, green, fresh or frozen" over canned, processed or packaged foods which are more sugary, salty and fatty. 70 to 75% of food eaten should be vegetables and fruit. Three meals at set times with snacks allowed between meals, but they must be fruit or vegetables. Aim to eat over a 12 hour period , example 7 am to 7 pm, and STOP after  your last meal of the day. Drink water,generally about 64 ounces per day, no other drink is as healthy. Fruit juice is best enjoyed in a healthy way, by EATING the fruit.

## 2019-01-27 ENCOUNTER — Ambulatory Visit (HOSPITAL_COMMUNITY)
Admission: RE | Admit: 2019-01-27 | Discharge: 2019-01-27 | Disposition: A | Discharge status: Home / Self Care | Payer: 59 | Source: Ambulatory Visit | Attending: Orthopedic Surgery | Admitting: Orthopedic Surgery

## 2019-01-27 ENCOUNTER — Other Ambulatory Visit: Payer: Self-pay

## 2019-01-27 DIAGNOSIS — M7672 Peroneal tendinitis, left leg: Secondary | ICD-10-CM | POA: Diagnosis not present

## 2019-01-27 DIAGNOSIS — S86312A Strain of muscle(s) and tendon(s) of peroneal muscle group at lower leg level, left leg, initial encounter: Secondary | ICD-10-CM | POA: Diagnosis not present

## 2019-01-29 ENCOUNTER — Encounter: Payer: Self-pay | Admitting: Family Medicine

## 2019-01-29 NOTE — Assessment & Plan Note (Signed)
  Patient re-educated about  the importance of commitment to a  minimum of 150 minutes of exercise per week as able.  The importance of healthy food choices with portion control discussed, as well as eating regularly and within a 12 hour window most days. The need to choose "clean , green" food 50 to 75% of the time is discussed, as well as to make water the primary drink and set a goal of 64 ounces water daily.    Weight /BMI 01/25/2019 01/09/2019 01/04/2019  WEIGHT 170 lb 170 lb 173 lb  HEIGHT - 5\' 2"  5\' 2"   BMI 31.09 kg/m2 31.09 kg/m2 31.64 kg/m2

## 2019-01-29 NOTE — Assessment & Plan Note (Signed)
eNT to  Re evluate in particular c/o mouth sores

## 2019-01-29 NOTE — Assessment & Plan Note (Signed)
Asked:confirms currently smokes cigarettes Assess: Unwilling to quit but cutting back Advise: needs to QUIT to reduce risk of cancer, cardio and cerebrovascular disease Assist: counseled for 5 minutes and literature provided Arrange: follow up in 3 months  

## 2019-01-29 NOTE — Assessment & Plan Note (Signed)
Increased symptoms suggestive of poor control, including dysphagia, GI to evaluate in the near future

## 2019-01-29 NOTE — Assessment & Plan Note (Signed)
Needs to come in for 2nd zoster

## 2019-01-29 NOTE — Assessment & Plan Note (Signed)
Depression currently controlled, needs to increease buspar to address anxiety

## 2019-01-30 ENCOUNTER — Ambulatory Visit (INDEPENDENT_AMBULATORY_CARE_PROVIDER_SITE_OTHER): Payer: 59 | Admitting: Otolaryngology

## 2019-01-30 ENCOUNTER — Ambulatory Visit: Payer: 59 | Admitting: Orthopedic Surgery

## 2019-01-30 ENCOUNTER — Other Ambulatory Visit: Payer: Self-pay

## 2019-02-01 ENCOUNTER — Ambulatory Visit (INDEPENDENT_AMBULATORY_CARE_PROVIDER_SITE_OTHER): Payer: 59

## 2019-02-01 ENCOUNTER — Other Ambulatory Visit: Payer: Self-pay

## 2019-02-01 ENCOUNTER — Ambulatory Visit: Payer: 59

## 2019-02-01 DIAGNOSIS — Z23 Encounter for immunization: Secondary | ICD-10-CM | POA: Diagnosis not present

## 2019-02-01 MED ORDER — PHENTERMINE HCL 37.5 MG PO TABS: 37.5000 mg | ORAL_TABLET | Freq: Every day | ORAL | 3 refills | Status: DC

## 2019-02-06 ENCOUNTER — Other Ambulatory Visit: Payer: Self-pay

## 2019-02-06 ENCOUNTER — Encounter: Payer: Self-pay | Admitting: Orthopedic Surgery

## 2019-02-06 ENCOUNTER — Ambulatory Visit (INDEPENDENT_AMBULATORY_CARE_PROVIDER_SITE_OTHER): Payer: 59 | Admitting: Orthopedic Surgery

## 2019-02-06 VITALS — Ht 62.0 in | Wt 170.0 lb

## 2019-02-06 DIAGNOSIS — M7672 Peroneal tendinitis, left leg: Secondary | ICD-10-CM | POA: Diagnosis not present

## 2019-02-06 DIAGNOSIS — M19072 Primary osteoarthritis, left ankle and foot: Secondary | ICD-10-CM | POA: Diagnosis not present

## 2019-02-06 NOTE — Progress Notes (Signed)
Office Visit Note   Patient: Zoe Bailey           Date of Birth: 06-27-1965           MRN: [id number] Visit Date: 02/06/2019              Requested by: Fayrene Helper, MD [address] [place],  [place] [address] PCP: Fayrene Helper, MD  Chief Complaint  Patient presents with  . Left Ankle - Follow-up    MRI review       HPI: Patient is a 53 year old woman who presents in follow-up for her left ankle.  Patient has had pain over the anterior talofibular ligament as well as pain over the peroneal tendons and pain in the subtalar joint.  Assessment & Plan: Visit Diagnoses: No diagnosis found.  Plan: Discussed different surgical options including peroneal tendon reconstruction anterior talofibular ligament reconstruction and the possibility of subtalar fusion if these 2 reconstructions did not help.  Patient states she would like to proceed with conservative treatment at this time we will place her in an ankle stabilizing orthosis reevaluate in 2 months.  Follow-Up Instructions: No follow-ups on file.   Ortho Exam  Patient is alert, oriented, no adenopathy, well-dressed, normal affect, normal respiratory effort. Examination patient does have some tenderness to palpation over the mid aspect of the plantar fascia there are no nodules she has no tenderness to palpation over the posterior tibial tendon.  She does have a positive anterior drawer with pain to palpation over the anterior talofibular ligament.  Patient also has swelling and tenderness to palpation over the peroneal tendons.  There is some tenderness to palpation over the sinus Tarsi but she has good subtalar motion.  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  Imaging: No results found. No images are attached to the encounter.  Labs: Lab Results  Component Value Date   HGBA1C 6.0 (H)  09/16/2018   HGBA1C 5.2 01/26/2018   HGBA1C 5.9 (H) 04/12/2017   ESRSEDRATE 14 05/11/2014   CRP <0.5 (L) 05/11/2014   REPTSTATUS 09/20/2018 FINAL 09/19/2018   CULT (A) 09/19/2018    <10,000 COLONIES/mL INSIGNIFICANT GROWTH Performed at [facility], [place] [address], [place], [place] [address]    [place] 12/26/2015     Lab Results  Component Value Date   ALBUMIN 4.0 09/16/2018   ALBUMIN 3.9 04/12/2017   ALBUMIN 4.1 06/11/2015    No results found for: MG Lab Results  Component Value Date   VD25OH 25.1 (L) 09/16/2018   VD25OH 29.5 (L) 04/12/2017   VD25OH 25.4 (L) 04/10/2016    No results found for: PREALBUMIN CBC EXTENDED Latest Ref Rng & Units 09/16/2018 02/10/2018 04/12/2017  WBC 4.0 - 10.5 K/uL 10.2 11.2(H) 9.9  RBC 3.87 - 5.11 MIL/uL 4.58 4.31 4.50  HGB 12.0 - 15.0 g/dL 14.1 13.7 14.0  HCT 36.0 - 46.0 % 42.8 41.3 42.5  PLT 150 - 400 K/uL 219 206 241  NEUTROABS 1.7 - 7.7 K/uL - - -  LYMPHSABS 0.7 - 4.0 K/uL - - -     Body mass index is 31.09 kg/m.  Orders:  No orders of the defined types were placed in this encounter.  No orders of the defined types were placed in this encounter.    Procedures: No procedures performed  Clinical Data: No additional findings.  ROS:  All other systems negative, except as noted in the HPI. Review of Systems  Objective: Vital Signs: Ht 5\' 2"  (1.575 m)   Wt 170 lb (77.1 kg)   BMI 31.09 kg/m   Specialty Comments:  No specialty comments available.  PMFS History: Patient Active Problem List   Diagnosis Date Noted  . Oral phase dysphagia 01/24/2019  . GERD (gastroesophageal reflux disease) 01/04/2019  . Swollen tongue 12/29/2018  . Dry mouth 12/22/2018  . Dysphagia 12/22/2018  . Hoarseness, chronic 12/22/2018  . Need for zoster vaccine 09/22/2018  . Hemorrhoid 08/11/2017  . Sigmoid diverticulosis 08/11/2017  . Essential hypertension 02/28/2015  . Joint pain 02/28/2015  . Easy bruising  02/19/2014  . Menometrorrhagia 07/14/2013  . Polymenorrhea 07/11/2013  . GAD (generalized anxiety disorder) 01/04/2013  . CMC arthritis, thumb, degenerative 04/02/2011  . 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/02/2011  . Obesity (BMI 30.0-34.9) 10/22/2009  . Depression with anxiety 10/22/2009  . NICOTINE ADDICTION 10/09/2008  . Migraine 02/24/2007   Past Medical History:  Diagnosis Date  . Arthritis   . Cancer ([place]) 2010 approx   skin cancer  . Complication of anesthesia    Wakes up during all surgeries  . COPD (chronic obstructive pulmonary disease) ([place])   . Depression   . Foot pain, right 03/2014  . Migraine    USES BOTOX FOR TREATMENT   . Shortness of breath dyspnea   . Sleep apnea   . Toenail fungus     Family History  Problem Relation Age of Onset  . Arthritis Unknown   . Cancer Unknown     Past Surgical History:  Procedure Laterality Date  . ANKLE FRACTURE SURGERY Right   . BREAST ENHANCEMENT SURGERY    . CARPAL TUNNEL RELEASE Right    x2  . CHONDROPLASTY Right 02/17/2018   Procedure: CHONDROPLASTY;  Surgeon: Paralee Cancel, MD;  Location: [facility];  Service: Orthopedics;  Laterality: Right;  . COLONOSCOPY WITH PROPOFOL N/A 05/07/2017   Procedure: COLONOSCOPY WITH PROPOFOL;  Surgeon: Rogene Houston, MD;  Location: [facility];  Service: Endoscopy;  Laterality: N/A;  8:30  . HERNIA REPAIR     umbilical  . KNEE ARTHROSCOPY WITH MEDIAL MENISECTOMY Right 02/17/2018   Procedure: KNEE ARTHROSCOPY WITH MEDIAL MENISECTOMY;  Surgeon: Paralee Cancel, MD;  Location: [facility];  Service: Orthopedics;  Laterality: Right;  . NASAL POLYP SURGERY    . POLYPECTOMY     Social History   Occupational History  . Occupation: Electrical engineer: [employer]  Tobacco Use  . Smoking status: Current Every Day Smoker    Packs/day: 0.25    Years: 33.00    Pack years: 8.25  . Smokeless tobacco: Never Used  Substance and Sexual Activity  .  Alcohol use: No    Alcohol/week: 0.0 standard drinks  . Drug use: No  . Sexual activity: Yes    Birth control/protection: None

## 2019-02-07 ENCOUNTER — Encounter: Payer: Self-pay | Admitting: Orthopedic Surgery

## 2019-02-08 MED FILL — CYCLOBENZAPRINE 10 MG TAB: 10 | 10 days supply | Qty: 30 | Fill #2

## 2019-02-17 ENCOUNTER — Other Ambulatory Visit: Payer: Self-pay | Admitting: Family Medicine

## 2019-02-20 MED FILL — GABAPENTIN 400 MG CAPSULE: 400 | 90 days supply | Qty: 180 | Fill #1

## 2019-02-20 MED FILL — DICLOFENAC SODIUM 75 MG TAB: 75 | 30 days supply | Qty: 30 | Fill #0

## 2019-02-20 MED FILL — CYCLOBENZAPRINE 10 MG TAB: 10 | 10 days supply | Qty: 30 | Fill #0

## 2019-02-22 MED FILL — BOTOX 100 UNITS VIAL: 100 | 84 days supply | Qty: 2 | Fill #2

## 2019-02-24 ENCOUNTER — Encounter (HOSPITAL_COMMUNITY): Payer: Self-pay

## 2019-02-27 MED FILL — PHENTERMINE 37.5 MG TABLET: 37.5 | 30 days supply | Qty: 30 | Fill #0

## 2019-02-28 NOTE — Patient Instructions (Signed)
DASIYAH BULNES  02/28/2019     @PREFPERIOPPHARMACY @   Your procedure is scheduled on  03/06/2019 .  Report to [facility] at  [facility].  Call this number if you have problems the morning of surgery:  [phone]   Remember:  Follow the diet and prep instructions given to you by Dr Olevia Perches office.                      Take these medicines the morning of surgery with A SIP OF WATER  Wellbutrin, buspar, flexeril(if needed), voltaren(if needed), gabapentin. Use your inhaler before you come.    Do not wear jewelry, make-up or nail polish.  Do not wear lotions, powders, or perfumes. Please wear deodorant and brush your teeth.  Do not shave 48 hours prior to surgery.  Men may shave face and neck.  Do not bring valuables to the hospital.  [facility] is not responsible for any belongings or valuables.  Contacts, dentures or bridgework may not be worn into surgery.  Leave your suitcase in the car.  After surgery it may be brought to your room.  For patients admitted to the hospital, discharge time will be determined by your treatment team.  Patients discharged the day of surgery will not be allowed to drive home.   Name and phone number of your driver:   Family Special instructions:  DO NOT take your phentermine until after your procedure.  Please read over the following fact sheets that you were given. Anesthesia Post-op Instructions and Care and Recovery After Surgery       Upper Endoscopy, Adult, Care After This sheet gives you information about how to care for yourself after your procedure. Your health care provider may also give you more specific instructions. If you have problems or questions, contact your health care provider. What can I expect after the procedure? After the procedure, it is common to have:  A sore throat.  Mild stomach pain or discomfort.  Bloating.  Nausea. Follow these instructions at home:   Follow instructions from your health care  provider about what to eat or drink after your procedure.  Return to your normal activities as told by your health care provider. Ask your health care provider what activities are safe for you.  Take over-the-counter and prescription medicines only as told by your health care provider.  Do not drive for 24 hours if you were given a sedative during your procedure.  Keep all follow-up visits as told by your health care provider. This is important. Contact a health care provider if you have:  A sore throat that lasts longer than one day.  Trouble swallowing. Get help right away if:  You vomit blood or your vomit looks like coffee grounds.  You have: ? A fever. ? Bloody, black, or tarry stools. ? A severe sore throat or you cannot swallow. ? Difficulty breathing. ? Severe pain in your chest or abdomen. Summary  After the procedure, it is common to have a sore throat, mild stomach discomfort, bloating, and nausea.  Do not drive for 24 hours if you were given a sedative during the procedure.  Follow instructions from your health care provider about what to eat or drink after your procedure.  Return to your normal activities as told by your health care provider. This information is not intended to replace advice given to you by your health care provider. Make  sure you discuss any questions you have with your health care provider. Document Released: 08/25/2011 Document Revised: 08/17/2017 Document Reviewed: 07/26/2017 Elsevier Patient Education  2020 [employer].  Esophageal Dilatation Esophageal dilatation, also called esophageal dilation, is a procedure to widen or open (dilate) a blocked or narrowed part of the esophagus. The esophagus is the part of the body that moves food and liquid from the mouth to the stomach. You may need this procedure if:  You have a buildup of scar tissue in your esophagus that makes it difficult, painful, or impossible to swallow. This can be caused by  gastroesophageal reflux disease (GERD).  You have cancer of the esophagus.  There is a problem with how food moves through your esophagus. In some cases, you may need this procedure repeated at a later time to dilate the esophagus gradually. Tell a health care provider about:  Any allergies you have.  All medicines you are taking, including vitamins, herbs, eye drops, creams, and over-the-counter medicines.  Any problems you or family members have had with anesthetic medicines.  Any blood disorders you have.  Any surgeries you have had.  Any medical conditions you have.  Any antibiotic medicines you are required to take before dental procedures.  Whether you are pregnant or may be pregnant. What are the risks? Generally, this is a safe procedure. However, problems may occur, including:  Bleeding due to a tear in the lining of the esophagus.  A hole (perforation) in the esophagus. What happens before the procedure?  Follow instructions from your health care provider about eating or drinking restrictions.  Ask your health care provider about changing or stopping your regular medicines. This is especially important if you are taking diabetes medicines or blood thinners.  Plan to have someone take you home from the hospital or clinic.  Plan to have a responsible adult care for you for at least 24 hours after you leave the hospital or clinic. This is important. What happens during the procedure?  You may be given a medicine to help you relax (sedative).  A numbing medicine may be sprayed into the back of your throat, or you may gargle the medicine.  Your health care provider may perform the dilatation using various surgical instruments, such as: ? Simple dilators. This instrument is carefully placed in the esophagus to stretch it. ? Guided wire bougies. This involves using an endoscope to insert a wire into the esophagus. A dilator is passed over this wire to enlarge the  esophagus. Then the wire is removed. ? Balloon dilators. An endoscope with a small balloon at the end is inserted into the esophagus. The balloon is inflated to stretch the esophagus and open it up. The procedure may vary among health care providers and hospitals. What happens after the procedure?  Your blood pressure, heart rate, breathing rate, and blood oxygen level will be monitored until the medicines you were given have worn off.  Your throat may feel slightly sore and numb. This will improve slowly over time.  You will not be allowed to eat or drink until your throat is no longer numb.  When you are able to drink, urinate, and sit on the edge of the bed without nausea or dizziness, you may be able to return home. Follow these instructions at home:  Take over-the-counter and prescription medicines only as told by your health care provider.  Do not drive for 24 hours if you were given a sedative during your procedure.  You  should have a responsible adult with you for 24 hours after the procedure.  Follow instructions from your health care provider about any eating or drinking restrictions.  Do not use any products that contain nicotine or tobacco, such as cigarettes and e-cigarettes. If you need help quitting, ask your health care provider.  Keep all follow-up visits as told by your health care provider. This is important. Get help right away if you:  Have a fever.  Have chest pain.  Have pain that is not relieved by medication.  Have trouble breathing.  Have trouble swallowing.  Vomit blood. Summary  Esophageal dilatation, also called esophageal dilation, is a procedure to widen or open (dilate) a blocked or narrowed part of the esophagus.  Plan to have someone take you home from the hospital or clinic.  For this procedure, a numbing medicine may be sprayed into the back of your throat, or you may gargle the medicine.  Do not drive for 24 hours if you were given a  sedative during your procedure. This information is not intended to replace advice given to you by your health care provider. Make sure you discuss any questions you have with your health care provider. Document Released: 04/16/2005 Document Revised: 02/05/2017 Document Reviewed: 12/29/2016 Elsevier Patient Education  2020 [place] After These instructions provide you with information about caring for yourself after your procedure. Your health care provider may also give you more specific instructions. Your treatment has been planned according to current medical practices, but problems sometimes occur. Call your health care provider if you have any problems or questions after your procedure. What can I expect after the procedure? After your procedure, you may:  Feel sleepy for several hours.  Feel clumsy and have poor balance for several hours.  Feel forgetful about what happened after the procedure.  Have poor judgment for several hours.  Feel nauseous or vomit.  Have a sore throat if you had a breathing tube during the procedure. Follow these instructions at home: For at least 24 hours after the procedure:      Have a responsible adult stay with you. It is important to have someone help care for you until you are awake and alert.  Rest as needed.  Do not: ? Participate in activities in which you could fall or become injured. ? Drive. ? Use heavy machinery. ? Drink alcohol. ? Take sleeping pills or medicines that cause drowsiness. ? Make important decisions or sign legal documents. ? Take care of children on your own. Eating and drinking  Follow the diet that is recommended by your health care provider.  If you vomit, drink water, juice, or soup when you can drink without vomiting.  Make sure you have little or no nausea before eating solid foods. General instructions  Take over-the-counter and prescription medicines only as told  by your health care provider.  If you have sleep apnea, surgery and certain medicines can increase your risk for breathing problems. Follow instructions from your health care provider about wearing your sleep device: ? Anytime you are sleeping, including during daytime naps. ? While taking prescription pain medicines, sleeping medicines, or medicines that make you drowsy.  If you smoke, do not smoke without supervision.  Keep all follow-up visits as told by your health care provider. This is important. Contact a health care provider if:  You keep feeling nauseous or you keep vomiting.  You feel light-headed.  You develop a rash.  You  have a fever. Get help right away if:  You have trouble breathing. Summary  For several hours after your procedure, you may feel sleepy and have poor judgment.  Have a responsible adult stay with you for at least 24 hours or until you are awake and alert. This information is not intended to replace advice given to you by your health care provider. Make sure you discuss any questions you have with your health care provider. Document Released: 06/16/2015 Document Revised: 05/24/2017 Document Reviewed: 06/16/2015 Elsevier Patient Education  2020 [employer].

## 2019-03-01 ENCOUNTER — Encounter (HOSPITAL_COMMUNITY)
Admission: RE | Admit: 2019-03-01 | Discharge: 2019-03-01 | Disposition: A | Discharge status: Home / Self Care | Payer: 59 | Source: Ambulatory Visit | Attending: Internal Medicine | Admitting: Internal Medicine

## 2019-03-01 ENCOUNTER — Encounter (HOSPITAL_COMMUNITY): Payer: Self-pay

## 2019-03-01 ENCOUNTER — Other Ambulatory Visit: Payer: Self-pay

## 2019-03-01 DIAGNOSIS — M791 Myalgia, unspecified site: Secondary | ICD-10-CM | POA: Diagnosis not present

## 2019-03-01 DIAGNOSIS — G518 Other disorders of facial nerve: Secondary | ICD-10-CM | POA: Diagnosis not present

## 2019-03-01 DIAGNOSIS — G43719 Chronic migraine without aura, intractable, without status migrainosus: Secondary | ICD-10-CM | POA: Diagnosis not present

## 2019-03-01 DIAGNOSIS — M542 Cervicalgia: Secondary | ICD-10-CM | POA: Diagnosis not present

## 2019-03-02 ENCOUNTER — Other Ambulatory Visit (HOSPITAL_COMMUNITY): Payer: 59

## 2019-03-04 ENCOUNTER — Other Ambulatory Visit: Payer: Self-pay

## 2019-03-04 ENCOUNTER — Other Ambulatory Visit (HOSPITAL_COMMUNITY)
Admission: RE | Admit: 2019-03-04 | Discharge: 2019-03-04 | Disposition: A | Discharge status: Home / Self Care | Payer: 59 | Source: Ambulatory Visit | Attending: Internal Medicine | Admitting: Internal Medicine

## 2019-03-04 DIAGNOSIS — Z20828 Contact with and (suspected) exposure to other viral communicable diseases: Secondary | ICD-10-CM | POA: Insufficient documentation

## 2019-03-04 DIAGNOSIS — Z01812 Encounter for preprocedural laboratory examination: Secondary | ICD-10-CM | POA: Diagnosis not present

## 2019-03-04 LAB — SARS CORONAVIRUS 2 (TAT 6-24 HRS): SARS Coronavirus 2: NEGATIVE

## 2019-03-06 ENCOUNTER — Encounter (HOSPITAL_COMMUNITY)
Admission: RE | Disposition: A | Discharge status: Home / Self Care | Payer: Self-pay | Source: Home / Self Care | Attending: Internal Medicine

## 2019-03-06 ENCOUNTER — Other Ambulatory Visit: Payer: Self-pay

## 2019-03-06 ENCOUNTER — Ambulatory Visit (HOSPITAL_COMMUNITY): Payer: 59 | Admitting: Anesthesiology

## 2019-03-06 ENCOUNTER — Encounter (HOSPITAL_COMMUNITY): Payer: Self-pay | Admitting: Internal Medicine

## 2019-03-06 ENCOUNTER — Ambulatory Visit (HOSPITAL_COMMUNITY)
Admission: RE | Admit: 2019-03-06 | Discharge: 2019-03-06 | Disposition: A | Discharge status: Home / Self Care | Payer: 59 | Attending: Internal Medicine | Admitting: Internal Medicine

## 2019-03-06 DIAGNOSIS — I10 Essential (primary) hypertension: Secondary | ICD-10-CM | POA: Insufficient documentation

## 2019-03-06 DIAGNOSIS — K259 Gastric ulcer, unspecified as acute or chronic, without hemorrhage or perforation: Secondary | ICD-10-CM | POA: Diagnosis not present

## 2019-03-06 DIAGNOSIS — M1990 Unspecified osteoarthritis, unspecified site: Secondary | ICD-10-CM | POA: Insufficient documentation

## 2019-03-06 DIAGNOSIS — F17200 Nicotine dependence, unspecified, uncomplicated: Secondary | ICD-10-CM | POA: Diagnosis not present

## 2019-03-06 DIAGNOSIS — Z85828 Personal history of other malignant neoplasm of skin: Secondary | ICD-10-CM | POA: Insufficient documentation

## 2019-03-06 DIAGNOSIS — F329 Major depressive disorder, single episode, unspecified: Secondary | ICD-10-CM | POA: Insufficient documentation

## 2019-03-06 DIAGNOSIS — R1311 Dysphagia, oral phase: Secondary | ICD-10-CM

## 2019-03-06 DIAGNOSIS — G43909 Migraine, unspecified, not intractable, without status migrainosus: Secondary | ICD-10-CM | POA: Diagnosis not present

## 2019-03-06 DIAGNOSIS — F419 Anxiety disorder, unspecified: Secondary | ICD-10-CM | POA: Diagnosis not present

## 2019-03-06 DIAGNOSIS — G4730 Sleep apnea, unspecified: Secondary | ICD-10-CM | POA: Diagnosis not present

## 2019-03-06 DIAGNOSIS — R1314 Dysphagia, pharyngoesophageal phase: Secondary | ICD-10-CM | POA: Diagnosis not present

## 2019-03-06 DIAGNOSIS — K3189 Other diseases of stomach and duodenum: Secondary | ICD-10-CM | POA: Insufficient documentation

## 2019-03-06 DIAGNOSIS — K219 Gastro-esophageal reflux disease without esophagitis: Secondary | ICD-10-CM | POA: Diagnosis not present

## 2019-03-06 DIAGNOSIS — K449 Diaphragmatic hernia without obstruction or gangrene: Secondary | ICD-10-CM | POA: Diagnosis not present

## 2019-03-06 DIAGNOSIS — Z791 Long term (current) use of non-steroidal anti-inflammatories (NSAID): Secondary | ICD-10-CM | POA: Insufficient documentation

## 2019-03-06 DIAGNOSIS — J449 Chronic obstructive pulmonary disease, unspecified: Secondary | ICD-10-CM | POA: Diagnosis not present

## 2019-03-06 DIAGNOSIS — Z79899 Other long term (current) drug therapy: Secondary | ICD-10-CM | POA: Diagnosis not present

## 2019-03-06 DIAGNOSIS — Z20828 Contact with and (suspected) exposure to other viral communicable diseases: Secondary | ICD-10-CM | POA: Insufficient documentation

## 2019-03-06 HISTORY — PX: MALONEY DILATION: SHX5535

## 2019-03-06 HISTORY — PX: ESOPHAGOGASTRODUODENOSCOPY (EGD) WITH PROPOFOL: SHX5813

## 2019-03-06 SURGERY — ESOPHAGOGASTRODUODENOSCOPY (EGD) WITH PROPOFOL
Anesthesia: General

## 2019-03-06 MED ORDER — CHLORHEXIDINE GLUCONATE CLOTH 2 % EX PADS: 6.0000 | MEDICATED_PAD | Freq: Once | CUTANEOUS | Status: DC

## 2019-03-06 MED ORDER — ESOMEPRAZOLE MAGNESIUM 40 MG PO CPDR: 40.0000 mg | DELAYED_RELEASE_CAPSULE | Freq: Every day | ORAL | 5 refills | Status: DC

## 2019-03-06 MED ORDER — PROPOFOL 500 MG/50ML IV EMUL
INTRAVENOUS | Status: DC | PRN
  Administered 2019-03-06: 150 ug/kg/min via INTRAVENOUS

## 2019-03-06 MED ORDER — KETAMINE HCL 10 MG/ML IJ SOLN
INTRAMUSCULAR | Status: DC | PRN
  Administered 2019-03-06: 10 mg via INTRAVENOUS

## 2019-03-06 MED ORDER — KETAMINE HCL 50 MG/5ML IJ SOSY
PREFILLED_SYRINGE | INTRAMUSCULAR | Status: AC
  Filled 2019-03-06: qty 5

## 2019-03-06 MED ORDER — LACTATED RINGERS IV SOLN: Freq: Once | INTRAVENOUS | Status: AC

## 2019-03-06 MED ORDER — GLYCOPYRROLATE 0.2 MG/ML IJ SOLN
INTRAMUSCULAR | Status: DC | PRN
  Administered 2019-03-06: 15.24 ug via INTRAVENOUS

## 2019-03-06 MED ORDER — LIDOCAINE HCL (CARDIAC) PF 100 MG/5ML IV SOSY
PREFILLED_SYRINGE | INTRAVENOUS | Status: DC | PRN
  Administered 2019-03-06: 60 mg via INTRAVENOUS

## 2019-03-06 MED ORDER — PROPOFOL 10 MG/ML IV BOLUS
INTRAVENOUS | Status: DC | PRN
  Administered 2019-03-06: 20 ug via INTRAVENOUS

## 2019-03-06 MED ORDER — LACTATED RINGERS IV SOLN: INTRAVENOUS | Status: DC | PRN

## 2019-03-06 MED FILL — ESOMEPRAZOLE MAG DR 40 MG C: 40 | 30 days supply | Qty: 30 | Fill #0

## 2019-03-06 NOTE — Transfer of Care (Signed)
Immediate Anesthesia Transfer of Care Note  Patient: Zoe Bailey  Procedure(s) Performed: ESOPHAGOGASTRODUODENOSCOPY (EGD) WITH PROPOFOL (N/A ) MALONEY DILATION  Patient Location: PACU  Anesthesia Type:General  Level of Consciousness: awake, alert , oriented and patient cooperative  Airway & Oxygen Therapy: Patient Spontanous Breathing  Post-op Assessment: Report given to RN and Post -op Vital signs reviewed and stable  Post vital signs: Reviewed and stable  Last Vitals:  Vitals Value Taken Time  BP 120/83 03/06/19 0915  Temp    Pulse 101 03/06/19 0916  Resp 17 03/06/19 0916  SpO2 95 % 03/06/19 0916  Vitals shown include unvalidated device data.  Last Pain:  Vitals:   03/06/19 0856  TempSrc:   PainSc: 0-No pain      Patients Stated Pain Goal: 7 ([id number])  Complications: No apparent anesthesia complications

## 2019-03-06 NOTE — Anesthesia Preprocedure Evaluation (Signed)
Anesthesia Evaluation  Patient identified by MRN, date of birth, ID band Patient awake    Reviewed: Allergy & Precautions, NPO status , Patient's Chart, lab work & pertinent test results  History of Anesthesia Complications (+) history of anesthetic complications  Airway Mallampati: II  TM Distance: >3 FB Neck ROM: Full    Dental no notable dental hx.    Pulmonary shortness of breath, sleep apnea , COPD, Current Smoker and Patient abstained from smoking.,    Pulmonary exam normal breath sounds clear to auscultation       Cardiovascular Exercise Tolerance: Good hypertension, Pt. on medications  Rhythm:Regular Rate:Normal     Neuro/Psych  Headaches, PSYCHIATRIC DISORDERS Anxiety Depression    GI/Hepatic Neg liver ROS, GERD  Medicated,  Endo/Other  negative endocrine ROS  Renal/GU negative Renal ROS     Musculoskeletal  (+) Arthritis , Osteoarthritis,    Abdominal   Peds  Hematology negative hematology ROS (+)   Anesthesia Other Findings   Reproductive/Obstetrics                            Anesthesia Physical Anesthesia Plan  ASA: II  Anesthesia Plan: General   Post-op Pain Management:    Induction: Intravenous  PONV Risk Score and Plan: 0 and TIVA  Airway Management Planned: Nasal Cannula and Natural Airway  Additional Equipment:   Intra-op Plan:   Post-operative Plan:   Informed Consent: I have reviewed the patients History and Physical, chart, labs and discussed the procedure including the risks, benefits and alternatives for the proposed anesthesia with the patient or authorized representative who has indicated his/her understanding and acceptance.     Dental advisory given  Plan Discussed with: CRNA  Anesthesia Plan Comments:         Anesthesia Quick Evaluation

## 2019-03-06 NOTE — Anesthesia Postprocedure Evaluation (Signed)
Anesthesia Post Note  Patient: ALAIIA TAMBURINO  Procedure(s) Performed: ESOPHAGOGASTRODUODENOSCOPY (EGD) WITH PROPOFOL (N/A ) Lane  Patient location during evaluation: PACU Anesthesia Type: General Level of consciousness: awake and alert and oriented Pain management: pain level controlled Vital Signs Assessment: post-procedure vital signs reviewed and stable Respiratory status: spontaneous breathing Cardiovascular status: stable Postop Assessment: no apparent nausea or vomiting Anesthetic complications: no     Last Vitals:  Vitals:   03/06/19 0824 03/06/19 0914  BP: (!) 146/89 (P) 120/83  Pulse: 91 (!) (P) 106  Resp: 16 (P) 17  Temp: 36.6 C (P) 36.8 C  SpO2: 96% (P) 95%    Last Pain:  Vitals:   03/06/19 0856  TempSrc:   PainSc: 0-No pain                 Jannat Rosemeyer A

## 2019-03-06 NOTE — Anesthesia Procedure Notes (Signed)
Procedure Name: General with mask airway Performed by: Cola Highfill A, CRNA Pre-anesthesia Checklist: Timeout performed, Patient being monitored, Suction available, Emergency Drugs available and Patient identified Patient Re-evaluated:Patient Re-evaluated prior to induction Oxygen Delivery Method: Non-rebreather mask       

## 2019-03-06 NOTE — Discharge Instructions (Signed)
No aspirin or NSAIDs for 24 hours. Take esomeprazole/Nexium 40 mg by mouth 30 minutes before breakfast daily. Resume other medications as before. Resume usual diet. No driving for 24 hours. Physician will call with biopsy results.  Upper Endoscopy, Adult, Care After This sheet gives you information about how to care for yourself after your procedure. Your health care provider may also give you more specific instructions. If you have problems or questions, contact your health care provider. What can I expect after the procedure? After the procedure, it is common to have:  A sore throat.  Mild stomach pain or discomfort.  Bloating.  Nausea. Follow these instructions at home:   Follow instructions from your health care provider about what to eat or drink after your procedure.  Return to your normal activities as told by your health care provider. Ask your health care provider what activities are safe for you.  Take over-the-counter and prescription medicines only as told by your health care provider.  Do not drive for 24 hours if you were given a sedative during your procedure.  Keep all follow-up visits as told by your health care provider. This is important. Contact a health care provider if you have:  A sore throat that lasts longer than one day.  Trouble swallowing. Get help right away if:  You vomit blood or your vomit looks like coffee grounds.  You have: ? A fever. ? Bloody, black, or tarry stools. ? A severe sore throat or you cannot swallow. ? Difficulty breathing. ? Severe pain in your chest or abdomen. Summary  After the procedure, it is common to have a sore throat, mild stomach discomfort, bloating, and nausea.  Do not drive for 24 hours if you were given a sedative during the procedure.  Follow instructions from your health care provider about what to eat or drink after your procedure.  Return to your normal activities as told by your health care  provider. This information is not intended to replace advice given to you by your health care provider. Make sure you discuss any questions you have with your health care provider. Document Released: 08/25/2011 Document Revised: 08/17/2017 Document Reviewed: 07/26/2017 Elsevier Patient Education  2020 [place] After These instructions provide you with information about caring for yourself after your procedure. Your health care provider may also give you more specific instructions. Your treatment has been planned according to current medical practices, but problems sometimes occur. Call your health care provider if you have any problems or questions after your procedure. What can I expect after the procedure? After your procedure, you may:  Feel sleepy for several hours.  Feel clumsy and have poor balance for several hours.  Feel forgetful about what happened after the procedure.  Have poor judgment for several hours.  Feel nauseous or vomit.  Have a sore throat if you had a breathing tube during the procedure. Follow these instructions at home: For at least 24 hours after the procedure:      Have a responsible adult stay with you. It is important to have someone help care for you until you are awake and alert.  Rest as needed.  Do not: ? Participate in activities in which you could fall or become injured. ? Drive. ? Use heavy machinery. ? Drink alcohol. ? Take sleeping pills or medicines that cause drowsiness. ? Make important decisions or sign legal documents. ? Take care of children on your own. Eating and drinking  Follow the diet that is recommended by your health care provider.  If you vomit, drink water, juice, or soup when you can drink without vomiting.  Make sure you have little or no nausea before eating solid foods. General instructions  Take over-the-counter and prescription medicines only as told by your health care  provider.  If you have sleep apnea, surgery and certain medicines can increase your risk for breathing problems. Follow instructions from your health care provider about wearing your sleep device: ? Anytime you are sleeping, including during daytime naps. ? While taking prescription pain medicines, sleeping medicines, or medicines that make you drowsy.  If you smoke, do not smoke without supervision.  Keep all follow-up visits as told by your health care provider. This is important. Contact a health care provider if:  You keep feeling nauseous or you keep vomiting.  You feel light-headed.  You develop a rash.  You have a fever. Get help right away if:  You have trouble breathing. Summary  For several hours after your procedure, you may feel sleepy and have poor judgment.  Have a responsible adult stay with you for at least 24 hours or until you are awake and alert. This information is not intended to replace advice given to you by your health care provider. Make sure you discuss any questions you have with your health care provider. Document Released: 06/16/2015 Document Revised: 05/24/2017 Document Reviewed: 06/16/2015 Elsevier Patient Education  2020 [employer].

## 2019-03-06 NOTE — H&P (Signed)
Zoe Bailey is an 53 y.o. female.   Chief Complaint: Patient is here for esophagogastroduodenoscopy and esophageal dilation. HPI: Patient is 53 year old Caucasian female who has symptoms of GERD for more than 20 years and was been taking OTC medications until recently and now she is on Nexium which she says helps her a great deal.  She is watching her diet.  She complains of dysphagia with liquids and solids.  She states she struggles with water.  She points to suprasternal area site of bolus obstruction.  She denies melena or epigastric pain.  She states she used to take as many as 18 Goody powders a day.  Now she is down to 2/day.  She takes these for headache/migraine.  She is also receiving Botox injection intermittently.  Past Medical History:  Diagnosis Date  . Arthritis   . Cancer ([place]) 2010 approx   skin cancer  . Complication of anesthesia    Wakes up during all surgeries  . COPD (chronic obstructive pulmonary disease) ([place])   . Depression   . Foot pain, right 03/2014  . Migraine    USES BOTOX FOR TREATMENT   . Shortness of breath dyspnea   . Sleep apnea   . Toenail fungus     Past Surgical History:  Procedure Laterality Date  . ANKLE FRACTURE SURGERY Right   . BREAST ENHANCEMENT SURGERY    . CARPAL TUNNEL RELEASE Right    x2  . CHONDROPLASTY Right 02/17/2018   Procedure: CHONDROPLASTY;  Surgeon: Paralee Cancel, MD;  Location: [facility];  Service: Orthopedics;  Laterality: Right;  . COLONOSCOPY WITH PROPOFOL N/A 05/07/2017   Procedure: COLONOSCOPY WITH PROPOFOL;  Surgeon: Rogene Houston, MD;  Location: [facility];  Service: Endoscopy;  Laterality: N/A;  8:30  . HERNIA REPAIR     umbilical  . KNEE ARTHROSCOPY WITH MEDIAL MENISECTOMY Right 02/17/2018   Procedure: KNEE ARTHROSCOPY WITH MEDIAL MENISECTOMY;  Surgeon: Paralee Cancel, MD;  Location: [facility];  Service: Orthopedics;  Laterality: Right;  . NASAL POLYP SURGERY    . POLYPECTOMY      Family History  Problem Relation  Age of Onset  . Arthritis Other   . Cancer Other    Social History:  reports that she has been smoking. She has a 8.25 pack-year smoking history. She has never used smokeless tobacco. She reports that she does not drink alcohol or use drugs.  Allergies:  Allergies  Allergen Reactions  . Effexor [Venlafaxine] Other (See Comments)    Decreased libido  . Other     Pt has a fear of tourniquets    Medications Prior to Admission  Medication Sig Dispense Refill  . buPROPion (WELLBUTRIN XL) 300 MG 24 hr tablet TAKE 1 TABLET BY MOUTH DAILY. (Patient taking differently: Take 300 mg by mouth daily. ) 90 tablet 3  . busPIRone (BUSPAR) 10 MG tablet Take 1 tablet (10 mg total) by mouth 3 (three) times daily. (Patient taking differently: Take 10 mg by mouth 3 (three) times daily as needed (panic attack). ) 90 tablet 3  . cyclobenzaprine (FLEXERIL) 10 MG tablet TAKE 1 TABLET BY MOUTH 3 TIMES DAILY AS NEEDED FOR MUSCLE SPASMS. (Patient taking differently: Take 10 mg by mouth 3 (three) times daily as needed for muscle spasms. ) 30 tablet 2  . diclofenac (VOLTAREN) 75 MG EC tablet TAKE 1 TABLET BY MOUTH ONCE DAILY AS NEEDED FOR PAIN (Patient taking differently: Take 75 mg by mouth daily. ) 30 tablet 2  .  gabapentin (NEURONTIN) 400 MG capsule Take two capsules by mouth every morning for migraine prevention (Patient taking differently: Take 800 mg by mouth every morning. Take two capsules by mouth every morning for migraine prevention) 180 capsule 3  . Multiple Vitamin (MULTIVITAMIN WITH MINERALS) TABS tablet Take 2 tablets by mouth daily.     . OnabotulinumtoxinA (BOTOX IM) Inject 1 Dose into the muscle every 3 (three) months.    . phentermine (ADIPEX-P) 37.5 MG tablet Take 1 tablet (37.5 mg total) by mouth daily before breakfast. 30 tablet 3  . tiotropium (SPIRIVA HANDIHALER) 18 MCG inhalation capsule Place 1 capsule (18 mcg total) into inhaler and inhale daily for 30 days. (Patient taking differently: Place  18 mcg into inhaler and inhale daily as needed (shortness of breath). ) 30 capsule 3  . triamterene-hydrochlorothiazide (MAXZIDE) 75-50 MG tablet Take 1 tablet by mouth daily. 90 tablet 3  . Turmeric (QC TUMERIC COMPLEX) 500 MG CAPS Take 500 mg by mouth 2 (two) times daily.     Marland Kitchen amLODipine (NORVASC) 5 MG tablet Take 1 tablet (5 mg total) by mouth daily. (Patient not taking: Reported on 02/21/2019) 90 tablet 3  . antiseptic oral rinse (BIOTENE) LIQD Swish and spit 15 ml between 2 to 3 times daily as needed, for dry mouth (Patient not taking: Reported on 02/21/2019) 1000 mL 1  . famotidine (PEPCID) 20 MG tablet Take 1 tablet (20 mg total) by mouth 2 (two) times daily. (Patient not taking: Reported on 02/21/2019) 60 tablet 1  . pantoprazole (PROTONIX) 20 MG tablet Take 1 tablet (20 mg total) by mouth daily. (Patient not taking: Reported on 02/21/2019) 90 tablet 1  . UNABLE TO FIND Blood pressure monitor x 1  DX I10 1 each 0    Results for orders placed or performed during the hospital encounter of 03/04/19 (from the past 48 hour(s))  SARS CORONAVIRUS 2 (TAT 6-24 HRS) Nasopharyngeal Nasopharyngeal Swab     Status: None   Collection Time: 03/04/19  2:08 PM   Specimen: Nasopharyngeal Swab  Result Value Ref Range   SARS Coronavirus 2 NEGATIVE NEGATIVE    Comment: (NOTE) SARS-CoV-2 target nucleic acids are NOT DETECTED. The SARS-CoV-2 RNA is generally detectable in upper and lower respiratory specimens during the acute phase of infection. Negative results do not preclude SARS-CoV-2 infection, do not rule out co-infections with other pathogens, and should not be used as the sole basis for treatment or other patient management decisions. Negative results must be combined with clinical observations, patient history, and epidemiological information. The expected result is Negative. Fact Sheet for Patients: SugarRoll.be Fact Sheet for Healthcare  Providers: https://www.woods-mathews.com/ This test is not yet approved or cleared by the [place] FDA and  has been authorized for detection and/or diagnosis of SARS-CoV-2 by FDA under an Emergency Use Authorization (EUA). This EUA will remain  in effect (meaning this test can be used) for the duration of the COVID-19 declaration under Section 56 4(b)(1) of the Act, 21 U.S.C. section 360bbb-3(b)(1), unless the authorization is terminated or revoked sooner. Performed at [facility], [place] [address], [place], [place] [address]    No results found.  Review of Systems  Blood pressure (!) 146/89, pulse 91, temperature 97.8 F (36.6 C), temperature source Oral, resp. rate 16, height 5\' 2"  (1.575 m), weight 76.2 kg, last menstrual period 08/21/2017, SpO2 96 %. Physical Exam  Constitutional: She appears well-developed and well-nourished.  HENT:  Mouth/Throat: Oropharynx is clear and moist.  Eyes:  Conjunctivae are normal. No scleral icterus.  Neck: No thyromegaly present.  Cardiovascular: Normal rate, regular rhythm and normal heart sounds.  No murmur heard. Respiratory: Effort normal and breath sounds normal.  GI: Soft. She exhibits no distension and no mass. There is no abdominal tenderness.  Musculoskeletal:        General: No edema.  Lymphadenopathy:    She has no cervical adenopathy.  Neurological: She is alert.  Skin: Skin is warm.     Assessment/Plan Dysphagia in a patient with chronic GERD. Esophagogastroduodenoscopy with esophageal dilation.   Hildred Laser, MD 03/06/2019, 8:49 AM

## 2019-03-06 NOTE — Op Note (Signed)
[facility] Patient Name: Zoe Bailey Procedure Date: 03/06/2019 8:37 AM MRN: [id number] Date of Birth: 12-Oct-1965 Attending MD: Hildred Laser , MD CSN: [id number] Age: 53 Admit Type: Outpatient Procedure:                Upper GI endoscopy Indications:              Esophageal dysphagia, Follow-up of                            gastro-esophageal reflux disease Providers:                Hildred Laser, MD, Otis Peak B. Sharon Seller, RN, Raphael Gibney, Technician Referring MD:             Norwood Levo. Simpson MD, MD Medicines:                Propofol per Anesthesia Complications:            No immediate complications. Estimated Blood Loss:     Estimated blood loss was minimal. Procedure:                Pre-Anesthesia Assessment:                           - Prior to the procedure, a History and Physical                            was performed, and patient medications and                            allergies were reviewed. The patient's tolerance of                            previous anesthesia was also reviewed. The risks                            and benefits of the procedure and the sedation                            options and risks were discussed with the patient.                            All questions were answered, and informed consent                            was obtained. Prior Anticoagulants: The patient has                            taken no previous anticoagulant or antiplatelet                            agents except for aspirin and has taken no previous  anticoagulant or antiplatelet agents except for                            NSAID medication. ASA Grade Assessment: II - A                            patient with mild systemic disease. After reviewing                            the risks and benefits, the patient was deemed in                            satisfactory condition to undergo the procedure.          After obtaining informed consent, the endoscope was                            passed under direct vision. Throughout the                            procedure, the patient's blood pressure, pulse, and                            oxygen saturations were monitored continuously. The                            [id number] ID:[id number]) scope was introduced through the                            mouth, and advanced to the second part of duodenum.                            The upper GI endoscopy was accomplished without                            difficulty. The patient tolerated the procedure                            well. Scope In: 8:58:50 AM Scope Out: 9:08:32 AM Total Procedure Duration: 0 hours 9 minutes 42 seconds  Findings:      The examined esophagus was normal.      The Z-line was regular and was found 36 cm from the incisors.      A 2 cm hiatal hernia was present.      No endoscopic abnormality was evident in the esophagus to explain the       patient's complaint of dysphagia. It was decided, however, to proceed       with dilation of the entire esophagus. The scope was withdrawn. Dilation       was performed with a Maloney dilator with no resistance at 65 Fr. The       dilation site was examined following endoscope reinsertion and showed no       change and no bleeding, mucosal tear or perforation.      A few erosions with no stigmata of recent bleeding were found in the  gastric antrum. Biopsies were taken with a cold forceps for histology.      The exam of the stomach was otherwise normal.      The duodenal bulb and second portion of the duodenum were normal. Impression:               - Normal esophagus.                           - Z-line regular, 36 cm from the incisors.                           - 2 cm hiatal hernia.                           - No endoscopic esophageal abnormality to explain                            patient's dysphagia. Esophagus dilated. Dilated.                            - Erosive gastropathy with no stigmata of recent                            bleeding. Biopsied.                           - Normal duodenal bulb and second portion of the                            duodenum. Moderate Sedation:      Per Anesthesia Care Recommendation:           - Patient has a contact number available for                            emergencies. The signs and symptoms of potential                            delayed complications were discussed with the                            patient. Return to normal activities tomorrow.                            Written discharge instructions were provided to the                            patient.                           - Resume previous diet today.                           - Continue present medications.                           - No aspirin, ibuprofen, naproxen, or other  non-steroidal anti-inflammatory drugs for 1 day.                           - Use Nexium (esomeprazole) 40 mg PO daily today.                           - Await pathology results. Procedure Code(s):        --- Professional ---                           [phone], Esophagogastroduodenoscopy, flexible,                            transoral; with biopsy, single or multiple                           43450, Dilation of esophagus, by unguided sound or                            bougie, single or multiple passes Diagnosis Code(s):        --- Professional ---                           K44.9, Diaphragmatic hernia without obstruction or                            gangrene                           K31.89, Other diseases of stomach and duodenum                           R13.14, Dysphagia, pharyngoesophageal phase                           K21.9, Gastro-esophageal reflux disease without                            esophagitis CPT copyright 2019 American Medical Association. All rights reserved. The codes documented in this  report are preliminary and upon coder review may  be revised to meet current compliance requirements. Hildred Laser, MD Hildred Laser, MD 03/06/2019 9:20:28 AM This report has been signed electronically. Number of Addenda: 0

## 2019-03-07 ENCOUNTER — Other Ambulatory Visit: Payer: Self-pay

## 2019-03-07 LAB — SURGICAL PATHOLOGY

## 2019-03-21 ENCOUNTER — Other Ambulatory Visit: Payer: Self-pay | Admitting: Family Medicine

## 2019-03-21 MED FILL — DICLOFENAC SODIUM 75 MG TAB: 75 | 30 days supply | Qty: 30 | Fill #1

## 2019-03-21 MED FILL — CYCLOBENZAPRINE 10 MG TAB: 10 | 10 days supply | Qty: 30 | Fill #1

## 2019-03-22 MED FILL — busPIRone HCL 10 MG TABS: 10 | 30 days supply | Qty: 90 | Fill #0

## 2019-03-31 MED FILL — PHENTERMINE 37.5 MG TABLET: 37.5 | 30 days supply | Qty: 30 | Fill #1

## 2019-03-31 MED FILL — ESOMEPRAZOLE MAG DR 40 MG C: 40 | 30 days supply | Qty: 30 | Fill #1

## 2019-04-10 ENCOUNTER — Encounter: Payer: Self-pay | Admitting: Orthopedic Surgery

## 2019-04-10 ENCOUNTER — Other Ambulatory Visit: Payer: Self-pay

## 2019-04-10 ENCOUNTER — Ambulatory Visit (INDEPENDENT_AMBULATORY_CARE_PROVIDER_SITE_OTHER): Payer: 59 | Admitting: Orthopedic Surgery

## 2019-04-10 VITALS — Ht 62.0 in | Wt 168.0 lb

## 2019-04-10 DIAGNOSIS — M7672 Peroneal tendinitis, left leg: Secondary | ICD-10-CM

## 2019-04-10 NOTE — Progress Notes (Signed)
Office Visit Note   Patient: Zoe Bailey           Date of Birth: 1965/11/09           MRN: [id number] Visit Date: 04/10/2019              Requested by: Fayrene Helper, MD [address], [place],  [place] [address] PCP: Fayrene Helper, MD  Chief Complaint  Patient presents with  . Left Ankle - Follow-up      HPI: This is a pleasant woman who is here in follow-up for her left peroneal tendinitis and ankle sprain.  She has been wearing the brace the last couple months and finds it quite helpful.  She is now weaning out of the brace.  She still feels like her ankle is weak.  But much improved.  She has some pain on the front of her foot  Assessment & Plan: Visit Diagnoses:  1. Peroneal tendonitis, left     Plan: I think she would benefit from a short course of proprioceptive and functional ankle strengthening.  She would like to do this near her home in [place].  She will follow-up for final visit in 4 weeks but if she is doing well I think she can forego this.  The brace has been very helpful and the Velcro has worn out and she is requesting another brace.  With regards to the pain on the front of her ankle this falls right where she tightens the brace and I have asked her to notice if it improves as she weans out of the brace.If she does not we could consider an ankle injection at her next visit  Follow-Up Instructions: No follow-ups on file.   Ortho Exam  Patient is alert, oriented, no adenopathy, well-dressed, normal affect, normal respiratory effort. Left Ankle : some soft tissue swelling isolated to the lateral ankle over the ATFL. Non painful with resisted eversion. Some increased translation with anterior drawer  Imaging: No results found. No images are attached to the encounter.  Labs: Lab Results  Component Value Date   HGBA1C 6.0 (H) 09/16/2018   HGBA1C 5.2 01/26/2018   HGBA1C 5.9 (H) 04/12/2017   ESRSEDRATE 14 05/11/2014   CRP <0.5 (L)  05/11/2014   REPTSTATUS 09/20/2018 FINAL 09/19/2018   CULT (A) 09/19/2018    <10,000 COLONIES/mL INSIGNIFICANT GROWTH Performed at [facility], [place] [address] [place], [place] [address]    [place] 12/26/2015     Lab Results  Component Value Date   ALBUMIN 4.0 09/16/2018   ALBUMIN 3.9 04/12/2017   ALBUMIN 4.1 06/11/2015    No results found for: MG Lab Results  Component Value Date   VD25OH 25.1 (L) 09/16/2018   VD25OH 29.5 (L) 04/12/2017   VD25OH 25.4 (L) 04/10/2016    No results found for: PREALBUMIN CBC EXTENDED Latest Ref Rng & Units 09/16/2018 02/10/2018 04/12/2017  WBC 4.0 - 10.5 K/uL 10.2 11.2(H) 9.9  RBC 3.87 - 5.11 MIL/uL 4.58 4.31 4.50  HGB 12.0 - 15.0 g/dL 14.1 13.7 14.0  HCT 36.0 - 46.0 % 42.8 41.3 42.5  PLT 150 - 400 K/uL 219 206 241  NEUTROABS 1.7 - 7.7 K/uL - - -  LYMPHSABS 0.7 - 4.0 K/uL - - -     Body mass index is 30.73 kg/m.  Orders:  Orders Placed This Encounter  Procedures  . Ambulatory referral to Physical Therapy   No orders of the  defined types were placed in this encounter.    Procedures: No procedures performed  Clinical Data: No additional findings.  ROS:  All other systems negative, except as noted in the HPI. Review of Systems  Objective: Vital Signs: Ht 5\' 2"  (1.575 m)   Wt 168 lb (76.2 kg)   LMP 08/21/2017 (Approximate) Comment: preg. test negative  BMI 30.73 kg/m   Specialty Comments:  No specialty comments available.  PMFS History: Patient Active Problem List   Diagnosis Date Noted  . Oral phase dysphagia 01/24/2019  . GERD (gastroesophageal reflux disease) 01/04/2019  . Swollen tongue 12/29/2018  . Dry mouth 12/22/2018  . Dysphagia 12/22/2018  . Hoarseness, chronic 12/22/2018  . Need for zoster vaccine 09/22/2018  . Hemorrhoid 08/11/2017  . Sigmoid diverticulosis 08/11/2017  . Essential hypertension 02/28/2015  . Joint pain 02/28/2015  . Easy bruising 02/19/2014  . Menometrorrhagia  07/14/2013  . Polymenorrhea 07/11/2013  . GAD (generalized anxiety disorder) 01/04/2013  . CMC arthritis, thumb, degenerative 04/02/2011  . 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/02/2011  . Obesity (BMI 30.0-34.9) 10/22/2009  . Depression with anxiety 10/22/2009  . NICOTINE ADDICTION 10/09/2008  . Migraine 02/24/2007   Past Medical History:  Diagnosis Date  . Arthritis   . Cancer ([place]) 2010 approx   skin cancer  . Complication of anesthesia    Wakes up during all surgeries  . COPD (chronic obstructive pulmonary disease) ([place])   . Depression   . Foot pain, right 03/2014  . Migraine    USES BOTOX FOR TREATMENT   . Shortness of breath dyspnea   . Sleep apnea   . Toenail fungus     Family History  Problem Relation Age of Onset  . Arthritis Other   . Cancer Other     Past Surgical History:  Procedure Laterality Date  . ANKLE FRACTURE SURGERY Right   . BREAST ENHANCEMENT SURGERY    . CARPAL TUNNEL RELEASE Right    x2  . CHONDROPLASTY Right 02/17/2018   Procedure: CHONDROPLASTY;  Surgeon: Paralee Cancel, MD;  Location: [facility];  Service: Orthopedics;  Laterality: Right;  . COLONOSCOPY WITH PROPOFOL N/A 05/07/2017   Procedure: COLONOSCOPY WITH PROPOFOL;  Surgeon: Rogene Houston, MD;  [facility];  Service: Endoscopy;  Laterality: N/A;  8:30  . ESOPHAGOGASTRODUODENOSCOPY (EGD) WITH PROPOFOL N/A 03/06/2019   Procedure: ESOPHAGOGASTRODUODENOSCOPY (EGD) WITH PROPOFOL;  Surgeon: Rogene Houston, MD;  [facility];  Service: Endoscopy;  Laterality: N/A;  . HERNIA REPAIR     umbilical  . KNEE ARTHROSCOPY WITH MEDIAL MENISECTOMY Right 02/17/2018   Procedure: KNEE ARTHROSCOPY WITH MEDIAL MENISECTOMY;  Surgeon: Paralee Cancel, MD;  Location: [facility];  Service: Orthopedics;  Laterality: Right;  . MALONEY DILATION  03/06/2019   Procedure: MALONEY DILATION;  Surgeon: Rogene Houston, MD;  [facility];  Service: Endoscopy;;  . NASAL POLYP SURGERY     . POLYPECTOMY     Social History   Occupational History  . Occupation: Electrical engineer: [employer]  Tobacco Use  . Smoking status: Current Every Day Smoker    Packs/day: 0.25    Years: 33.00    Pack years: 8.25  . Smokeless tobacco: Never Used  Substance and Sexual Activity  . Alcohol use: No    Alcohol/week: 0.0 standard drinks  . Drug use: No  . Sexual activity: Yes    Birth control/protection: None

## 2019-04-11 DIAGNOSIS — M791 Myalgia, unspecified site: Secondary | ICD-10-CM | POA: Diagnosis not present

## 2019-04-11 DIAGNOSIS — M542 Cervicalgia: Secondary | ICD-10-CM | POA: Diagnosis not present

## 2019-04-11 DIAGNOSIS — G43719 Chronic migraine without aura, intractable, without status migrainosus: Secondary | ICD-10-CM | POA: Diagnosis not present

## 2019-04-17 ENCOUNTER — Telehealth (HOSPITAL_COMMUNITY): Payer: Self-pay | Admitting: Physical Therapy

## 2019-04-17 ENCOUNTER — Ambulatory Visit (HOSPITAL_COMMUNITY): Payer: 59 | Admitting: Physical Therapy

## 2019-04-17 NOTE — Telephone Encounter (Signed)
donna coe called to cancel this pt's appt due to a family emergency

## 2019-04-18 ENCOUNTER — Ambulatory Visit (INDEPENDENT_AMBULATORY_CARE_PROVIDER_SITE_OTHER): Payer: 59 | Admitting: Internal Medicine

## 2019-04-21 MED FILL — DICLOFENAC SOD EC 75 MG TAB: 75 | 30 days supply | Qty: 30 | Fill #2

## 2019-04-27 ENCOUNTER — Other Ambulatory Visit: Payer: Self-pay | Admitting: Family Medicine

## 2019-04-27 MED FILL — BUPROPION HCL ER (XL) 300 M: 300 | 90 days supply | Qty: 90 | Fill #0

## 2019-04-27 MED FILL — TRIAMTERENE-HCTZ 75-50 MG T: 75-50 | 90 days supply | Qty: 90 | Fill #2

## 2019-04-27 MED FILL — CYCLOBENZAPRINE HCL 10 MG T: 10 | 10 days supply | Qty: 30 | Fill #2

## 2019-04-27 MED FILL — busPIRone HCL 10 MG TABS: 10 | 30 days supply | Qty: 90 | Fill #1

## 2019-04-27 MED FILL — ESOMEPRAZOLE MAG DR 40 MG C: 40 | 30 days supply | Qty: 30 | Fill #2

## 2019-04-28 MED FILL — PHENTERMINE 37.5 MG TABLET: 37.5 | 30 days supply | Qty: 30 | Fill #2

## 2019-05-17 ENCOUNTER — Other Ambulatory Visit: Payer: Self-pay | Admitting: Family Medicine

## 2019-05-17 MED FILL — CYCLOBENZAPRINE HCL 10 MG T: 10 | 10 days supply | Qty: 30 | Fill #0

## 2019-05-17 MED FILL — DICLOFENAC SOD EC 75 MG TAB: 75 | 30 days supply | Qty: 30 | Fill #0

## 2019-05-26 ENCOUNTER — Ambulatory Visit (HOSPITAL_COMMUNITY): Payer: 59 | Attending: Physician Assistant | Admitting: Physical Therapy

## 2019-05-26 ENCOUNTER — Encounter (HOSPITAL_COMMUNITY): Payer: Self-pay | Admitting: Physical Therapy

## 2019-05-26 ENCOUNTER — Other Ambulatory Visit: Payer: Self-pay

## 2019-05-26 DIAGNOSIS — M25672 Stiffness of left ankle, not elsewhere classified: Secondary | ICD-10-CM | POA: Insufficient documentation

## 2019-05-26 DIAGNOSIS — R600 Localized edema: Secondary | ICD-10-CM | POA: Insufficient documentation

## 2019-05-26 DIAGNOSIS — M25671 Stiffness of right ankle, not elsewhere classified: Secondary | ICD-10-CM | POA: Insufficient documentation

## 2019-05-26 DIAGNOSIS — M6281 Muscle weakness (generalized): Secondary | ICD-10-CM | POA: Diagnosis not present

## 2019-05-26 MED FILL — BOTOX 100 UNITS VIAL: 100 | 84 days supply | Qty: 2 | Fill #3

## 2019-05-26 NOTE — Therapy (Addendum)
[facility] [address] [place], [place], [address] Phone: [phone]   Fax:  [phone]  Physical Therapy Evaluation  Patient Details  Name: Zoe Bailey MRN: [id number] Date of Birth: 1965/10/07 Referring Provider (PT): Persons, Bevely Palmer, [place]   Encounter Date: 05/26/2019  PT End of Session - 05/26/19 1722    Visit Number  1    Number of Visits  12    Date for PT Re-Evaluation  07/07/19    Authorization Type  [facility]    Authorization Time Period  05/26/19-07/07/19 (Auth required after 25th visit)    Authorization - Visit Number  1    Authorization - Number of Visits  25    Progress Note Due on Visit  10    PT Start Time  1602    PT Stop Time  1645    PT Time Calculation (min)  43 min    Equipment Utilized During Treatment  Other (comment)   LT ankle brace   Activity Tolerance  Patient tolerated treatment well    Behavior During Therapy  [facility] for tasks assessed/performed       Past Medical History:  Diagnosis Date  . Arthritis   . Cancer ([place]) 2010 approx   skin cancer  . Complication of anesthesia    Wakes up during all surgeries  . COPD (chronic obstructive pulmonary disease) ([place])   . Depression   . Foot pain, right 03/2014  . Migraine    USES BOTOX FOR TREATMENT   . Shortness of breath dyspnea   . Sleep apnea   . Toenail fungus     Past Surgical History:  Procedure Laterality Date  . ANKLE FRACTURE SURGERY Right   . BREAST ENHANCEMENT SURGERY    . CARPAL TUNNEL RELEASE Right    x2  . CHONDROPLASTY Right 02/17/2018   Procedure: CHONDROPLASTY;  Surgeon: Paralee Cancel, MD;  [facility];  Service: Orthopedics;  Laterality: Right;  . COLONOSCOPY WITH PROPOFOL N/A 05/07/2017   Procedure: COLONOSCOPY WITH PROPOFOL;  Surgeon: Rogene Houston, MD;  [facility];  Service: Endoscopy;  Laterality: N/A;  8:30  . ESOPHAGOGASTRODUODENOSCOPY (EGD) WITH PROPOFOL N/A 03/06/2019   Procedure:  ESOPHAGOGASTRODUODENOSCOPY (EGD) WITH PROPOFOL;  Surgeon: Rogene Houston, MD;  [facility];  Service: Endoscopy;  Laterality: N/A;  . HERNIA REPAIR     umbilical  . KNEE ARTHROSCOPY WITH MEDIAL MENISECTOMY Right 02/17/2018   Procedure: KNEE ARTHROSCOPY WITH MEDIAL MENISECTOMY;  Surgeon: Paralee Cancel, MD;  [facility];  Service: Orthopedics;  Laterality: Right;  . MALONEY DILATION  03/06/2019   Procedure: MALONEY DILATION;  Surgeon: Rogene Houston, MD;  [facility];  Service: Endoscopy;;  . NASAL POLYP SURGERY    . POLYPECTOMY      There were no vitals filed for this visit.   Subjective Assessment - 05/26/19 1613    Subjective  Patient reported that about 4 years ago she fell off of her porch and she broke her left foot, but she did not have surgery to repair it. She said that it improved but that since October the pain has increased suddenly. She said she woke up and that it was swollen and she had trouble walking on it. She stated that she didn't do anything to trigger this. She has been wearing a brace, and that it has been helping a lot. She reported that the part that hurts the most is the middle  of the foot and that at worst it gets up to a 9/10 pain over the last week. She reported a history of having broken RT ankle and that she had surgery to repair it.    Pertinent History  History of broken left foot 4 years ago per patient report    Limitations  House hold activities;Standing;Walking    How long can you sit comfortably?  Not limited    How long can you stand comfortably?  2.5-3 hours    How long can you walk comfortably?  10-20 minutes of walking    Diagnostic tests  MRI: see chart review    Patient Stated Goals  To not have to have surgery    Currently in Pain?  No/denies         [facility] PT Assessment - 05/26/19 0001      Assessment   Medical Diagnosis  Peroneal tendonitis    Referring Provider (PT)  Persons, Bevely Palmer, [place]    Onset  Date/Surgical Date  --   October 2020   Next MD Visit  --   None scheduled   Prior Therapy  None      Precautions   Precautions  None    Required Braces or Orthoses  Other Brace/Splint   Patient wearing LT ankle brace for comfort     Restrictions   Weight Bearing Restrictions  No      Balance Screen   Has the patient fallen in the past 6 months  Yes    How many times?  1    Has the patient had a decrease in activity level because of a fear of falling?   Yes    Is the patient reluctant to leave their home because of a fear of falling?   No      Home Environment   Living Environment  Private residence    Living Arrangements  Alone    Type of [place] to enter    Entrance Stairs-Number of Steps  4    Entrance Stairs-Rails  None    Home Layout  One level    Home Equipment  None      Prior Function   Level of Independence  Independent;Independent with basic ADLs    Vocation  Full time employment    Vocation Requirements  Standing and walking    Leisure  Exercise      Cognition   Overall Cognitive Status  Within Functional Limits for tasks assessed      Observation/Other Assessments   Focus on Therapeutic Outcomes (FOTO)   47%      Observation/Other Assessments-Edema    Edema  Figure 8      Figure 8 Edema   Figure 8 - Right   50 cm    Figure 8 - Left   52 cm      Sensation   Light Touch  Appears Intact      ROM / Strength   AROM / PROM / Strength  AROM;Strength      AROM   AROM Assessment Site  Ankle    Right/Left Ankle  Right;Left    Right Ankle Dorsiflexion  10    Right Ankle Plantar Flexion  40    Right Ankle Inversion  30    Right Ankle Eversion  15    Left Ankle Dorsiflexion  --   lacks 10   Left Ankle Plantar Flexion  23  Left Ankle Inversion  15    Left Ankle Eversion  25      Strength   Strength Assessment Site  Hip;Knee;Ankle    Right/Left Hip  Right;Left    Right Hip Flexion  4/5    Right Hip Extension  4/5     Right Hip ABduction  5/5    Left Hip Flexion  4/5    Left Hip Extension  4/5    Left Hip ABduction  5/5    Right/Left Knee  Right;Left    Right Knee Flexion  4/5    Right Knee Extension  5/5    Left Knee Flexion  5/5    Left Knee Extension  5/5    Right/Left Ankle  Right;Left    Right Ankle Dorsiflexion  5/5    Right Ankle Plantar Flexion  --   Full resistance in seated   Right Ankle Inversion  5/5    Right Ankle Eversion  5/5    Left Ankle Dorsiflexion  4+/5    Left Ankle Plantar Flexion  --   Full resistance in sitting   Left Ankle Inversion  5/5    Left Ankle Eversion  5/5      Palpation   Palpation comment  Reported tenderness to palpation around lateral malleolus and at insertion of peroneal tendons      Special Tests   Other special tests  --   Ankle: anterior drawer negative     Ambulation/Gait   Ambulation/Gait  Yes    Ambulation Distance (Feet)  400 Feet   2MWT   Assistive device  None    Gait Pattern  Antalgic    Ambulation Surface  Level;Indoor      Balance   Balance Assessed  Yes      Static Standing Balance   Static Standing - Balance Support  No upper extremity supported    Static Standing Balance -  Activities   Single Leg Stance - Right Leg;Single Leg Stance - Left Leg    Static Standing - Comment/# of Minutes  2 seconds each LE                Objective measurements completed on examination: See above findings.              PT Education - 05/26/19 1712    Education Details  Discussed examination findings, POC, and RICE to decrease edema.    Person(s) Educated  Patient    Methods  Explanation    Comprehension  Verbalized understanding       PT Short Term Goals - 05/26/19 1716      PT SHORT TERM GOAL #1   Title  Patient will demonstrate understanding and report regular compliance with HEP to improve mobility.    Time  3    Period  Weeks    Status  New    Target Date  06/16/19      PT SHORT TERM GOAL #2   Title   Patient will report an improvement of at least 50% in overall symptoms indicating an improvement in QOL.    Time  3    Period  Weeks    Status  New    Target Date  06/16/19        PT Long Term Goals - 05/26/19 1717      PT LONG TERM GOAL #1   Title  Patient will demonstrate improvement in left ankle AROM of at least 5 degrees in all deficient  planes of motion to improve gait mechanics and decrease pain.    Time  6    Period  Weeks    Status  New    Target Date  07/07/19      PT LONG TERM GOAL #2   Title  Patient will demonstrate ability to maintain single limb stance for at least 5 seconds on each LE in order to improve safety with stair negotiation.    Time  6    Period  Weeks    Status  New    Target Date  07/07/19      PT LONG TERM GOAL #3   Title  Patient will report that her pain has not exceeded a 3/10 maximum over the course of a 1 week period indicating an improvement in tolerance to daily activities.    Time  6    Period  Weeks    Status  New    Target Date  07/07/19      PT LONG TERM GOAL #4   Title  Patient will report ability to ambulate for at least 30 minutes in order to perform shopping activities with improved ease.    Time  6    Period  Weeks    Status  New    Target Date  07/07/19      PT LONG TERM GOAL #5   Title  Patient will report an improvement of at least 75% in overall symptoms indicating an improvement in QOL.    Time  6    Period  Weeks    Status  New    Target Date  07/07/19             Plan - 05/26/19 1734    Clinical Impression Statement  Patient is a 53 year old female who presents to outpatient physical therapy with primary complaint of left ankle pain which has been increased since October. Upon examination patient demonstrated deficits in AROM, strength, balance, gait, and edema. Patient was educated on benefits of therapy and educated on techniques to reduce edema. Patient would benefit from skilled physical therapy to address  the abovementioned deficits and improve her overall QOL.    Personal Factors and Comorbidities  Age;Time since onset of injury/illness/exacerbation;Comorbidity 3+    Comorbidities  HTN, GAD, Depression    Examination-Activity Limitations  Stand;Locomotion Level;Stairs;Squat    Examination-Participation Restrictions  Yard Work;Cleaning;Community Activity;Meal Prep;Laundry    Stability/Clinical Decision Making  Evolving/Moderate complexity    Clinical Decision Making  Moderate    Rehab Potential  Good    PT Frequency  2x / week    PT Duration  6 weeks    PT Treatment/Interventions  ADLs/Self Care Home Management;Aquatic Therapy;Cryotherapy;Electrical Stimulation;Moist Heat;DME Instruction;Gait training;Stair training;Functional mobility training;Therapeutic activities;Therapeutic exercise;Balance training;Neuromuscular re-education;Patient/family education;Orthotic Fit/Training;Manual techniques;Passive range of motion;Dry needling;Energy conservation;Taping    PT Next Visit Plan  Review eval/goals. See MRI results for more detail regarding injury. Focus on edema reduction and improving ankle ROM initially. Expand HEP including active ankle mobility exercises to tolerance. Begin with isometric ankle strengthening initially and progress as able. Avoid excessive end range DF stretching or inversion of ankle due to results of MRI showing tendon tears. Long term: plan to help create an exercise program to perform at the gym   PT Home Exercise Plan  05/26/19: Elevate and ice    Consulted and Agree with Plan of Care  Patient       Patient will benefit from skilled therapeutic intervention in  order to improve the following deficits and impairments:  Abnormal gait, Pain, Decreased mobility, Decreased activity tolerance, Decreased endurance, Decreased range of motion, Decreased strength, Hypomobility, Decreased balance, Difficulty walking, Increased edema  Visit Diagnosis: Stiffness of left ankle, not  elsewhere classified  Stiffness of right ankle, not elsewhere classified  Localized edema  Muscle weakness (generalized)     Problem List Patient Active Problem List   Diagnosis Date Noted  . Oral phase dysphagia 01/24/2019  . GERD (gastroesophageal reflux disease) 01/04/2019  . Swollen tongue 12/29/2018  . Dry mouth 12/22/2018  . Dysphagia 12/22/2018  . Hoarseness, chronic 12/22/2018  . Need for zoster vaccine 09/22/2018  . Hemorrhoid 08/11/2017  . Sigmoid diverticulosis 08/11/2017  . Essential hypertension 02/28/2015  . Joint pain 02/28/2015  . Easy bruising 02/19/2014  . Menometrorrhagia 07/14/2013  . Polymenorrhea 07/11/2013  . GAD (generalized anxiety disorder) 01/04/2013  . CMC arthritis, thumb, degenerative 04/02/2011  . 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/02/2011  . Obesity (BMI 30.0-34.9) 10/22/2009  . Depression with anxiety 10/22/2009  . NICOTINE ADDICTION 10/09/2008  . Migraine 02/24/2007   Clarene Critchley PT, DPT 5:43 PM, 05/26/19 [place], [place], [address] Phone: [phone]   Fax:  [phone]  Name: AMESHA SAULS MRN: [id number] Date of Birth: 1965-04-21

## 2019-05-29 ENCOUNTER — Ambulatory Visit (HOSPITAL_COMMUNITY): Payer: 59 | Admitting: Physical Therapy

## 2019-05-29 ENCOUNTER — Telehealth (HOSPITAL_COMMUNITY): Payer: Self-pay | Admitting: Physical Therapy

## 2019-05-29 NOTE — Telephone Encounter (Signed)
No Show #1, Left message on patient's voicemail making her aware of missed appointment and reminded her of next appointment.  1:04 PM, 05/29/19 Mearl Latin PT, DPT Physical Therapist at [facility]

## 2019-06-01 ENCOUNTER — Ambulatory Visit (HOSPITAL_COMMUNITY): Payer: 59

## 2019-06-01 ENCOUNTER — Encounter (HOSPITAL_COMMUNITY): Payer: Self-pay

## 2019-06-01 ENCOUNTER — Other Ambulatory Visit: Payer: Self-pay

## 2019-06-01 DIAGNOSIS — M791 Myalgia, unspecified site: Secondary | ICD-10-CM | POA: Diagnosis not present

## 2019-06-01 DIAGNOSIS — M25671 Stiffness of right ankle, not elsewhere classified: Secondary | ICD-10-CM

## 2019-06-01 DIAGNOSIS — G43719 Chronic migraine without aura, intractable, without status migrainosus: Secondary | ICD-10-CM | POA: Diagnosis not present

## 2019-06-01 DIAGNOSIS — M25672 Stiffness of left ankle, not elsewhere classified: Secondary | ICD-10-CM | POA: Diagnosis not present

## 2019-06-01 DIAGNOSIS — M6281 Muscle weakness (generalized): Secondary | ICD-10-CM | POA: Diagnosis not present

## 2019-06-01 DIAGNOSIS — R600 Localized edema: Secondary | ICD-10-CM | POA: Diagnosis not present

## 2019-06-01 DIAGNOSIS — M542 Cervicalgia: Secondary | ICD-10-CM | POA: Diagnosis not present

## 2019-06-01 DIAGNOSIS — G518 Other disorders of facial nerve: Secondary | ICD-10-CM | POA: Diagnosis not present

## 2019-06-01 NOTE — Therapy (Signed)
Zoe Bailey, [place], [address] Phone: [phone]   Fax:  [phone]  Physical Therapy Treatment  Patient Details  Name: Zoe Bailey MRN: [id number] Date of Birth: 1965-09-11 Referring Provider (PT): Persons, Bevely Palmer, [place]   Encounter Date: 06/01/2019  PT End of Session - 06/01/19 0818    Visit Number  2    Number of Visits  12    Date for PT Re-Evaluation  07/07/19    Authorization Type  [facility]    Authorization Time Period  05/26/19-07/07/19 (Auth required after 25th visit)    Authorization - Visit Number  2    Authorization - Number of Visits  25    Progress Note Due on Visit  10    PT Start Time  0818    PT Stop Time  0900    PT Time Calculation (min)  42 min    Equipment Utilized During Treatment  --    Activity Tolerance  Patient tolerated treatment well;No increased pain    Behavior During Therapy  WFL for tasks assessed/performed       Past Medical History:  Diagnosis Date  . Arthritis   . Cancer ([place]) 2010 approx   skin cancer  . Complication of anesthesia    Wakes up during all surgeries  . COPD (chronic obstructive pulmonary disease) ([place])   . Depression   . Foot pain, right 03/2014  . Migraine    USES BOTOX FOR TREATMENT   . Shortness of breath dyspnea   . Sleep apnea   . Toenail fungus     Past Surgical History:  Procedure Laterality Date  . ANKLE FRACTURE SURGERY Right   . BREAST ENHANCEMENT SURGERY    . CARPAL TUNNEL RELEASE Right    x2  . CHONDROPLASTY Right 02/17/2018   Procedure: CHONDROPLASTY;  Surgeon: Paralee Cancel, MD;  [facility]: [facility];  Service: Orthopedics;  Laterality: Right;  . COLONOSCOPY WITH PROPOFOL N/A 05/07/2017   Procedure: COLONOSCOPY WITH PROPOFOL;  Surgeon: Rogene Houston, MD;  [facility];  Service: Endoscopy;  Laterality: N/A;  8:30  . ESOPHAGOGASTRODUODENOSCOPY (EGD) WITH PROPOFOL N/A 03/06/2019   Procedure: ESOPHAGOGASTRODUODENOSCOPY  (EGD) WITH PROPOFOL;  Surgeon: Rogene Houston, MD;  [facility];  Service: Endoscopy;  Laterality: N/A;  . HERNIA REPAIR     umbilical  . KNEE ARTHROSCOPY WITH MEDIAL MENISECTOMY Right 02/17/2018   Procedure: KNEE ARTHROSCOPY WITH MEDIAL MENISECTOMY;  Surgeon: Paralee Cancel, MD;  [facility]: [facility];  Service: Orthopedics;  Laterality: Right;  . MALONEY DILATION  03/06/2019   Procedure: MALONEY DILATION;  Surgeon: Rogene Houston, MD;  [facility];  Service: Endoscopy;;  . NASAL POLYP SURGERY    . POLYPECTOMY      There were no vitals filed for this visit.  Subjective Assessment - 06/01/19 0821    Subjective  Pt reports no pain this morning because ice and elevates it over night. Pt reports at night/afternoon 7-8/10 pain.    Pertinent History  History of broken left foot 4 years ago per patient report    Limitations  House hold activities;Standing;Walking    How long can you sit comfortably?  Not limited    How long can you stand comfortably?  2.5-3 hours    How long can you walk comfortably?  10-20 minutes of walking    Diagnostic tests  MRI: see chart review    Patient Stated Goals  To not have to have surgery    Currently in Pain?  No/denies              [facility] Adult PT Treatment/Exercise - 06/01/19 0001      Exercises   Exercises  Ankle      Ankle Exercises: Seated   Ankle Circles/Pumps  AROM;Left;20 reps    Ankle Circles/Pumps Limitations  CW/CCW    Other Seated Ankle Exercises  seated arch doming, lots of verbal and tactile cues for form x15 minutes      Ankle Exercises: Supine   Isometrics  gentle isometrics in long sitting, DF/PF/inv/ev, x15 reps with therapist providing gentle resistance             PT Education - 06/01/19 0821    Education Details  Review goals, updated HEP, educated pt on increased swelling with increasing mobility, improtance of brace for stability early on and improtance of progressing out of it when muscles  strengthen, continue RICE.    Person(s) Educated  Patient    Methods  Explanation;Demonstration;Handout;Tactile cues;Verbal cues    Comprehension  Verbalized understanding;Returned demonstration       PT Short Term Goals - 06/01/19 [id number]      PT SHORT TERM GOAL #1   Title  Patient will demonstrate understanding and report regular compliance with HEP to improve mobility.    Time  3    Period  Weeks    Status  On-going    Target Date  06/16/19      PT SHORT TERM GOAL #2   Title  Patient will report an improvement of at least 50% in overall symptoms indicating an improvement in QOL.    Time  3    Period  Weeks    Status  On-going    Target Date  06/16/19        PT Long Term Goals - 06/01/19 [id number]      PT LONG TERM GOAL #1   Title  Patient will demonstrate improvement in left ankle AROM of at least 5 degrees in all deficient planes of motion to improve gait mechanics and decrease pain.    Time  6    Period  Weeks    Status  On-going      PT LONG TERM GOAL #2   Title  Patient will demonstrate ability to maintain single limb stance for at least 5 seconds on each LE in order to improve safety with stair negotiation.    Time  6    Period  Weeks    Status  On-going      PT LONG TERM GOAL #3   Title  Patient will report that her pain has not exceeded a 3/10 maximum over the course of a 1 week period indicating an improvement in tolerance to daily activities.    Time  6    Period  Weeks    Status  On-going      PT LONG TERM GOAL #4   Title  Patient will report ability to ambulate for at least 30 minutes in order to perform shopping activities with improved ease.    Time  6    Period  Weeks    Status  On-going      PT LONG TERM GOAL #5   Title  Patient will report an improvement of at least 75% in overall symptoms indicating an improvement in QOL.    Time  6    Period  Weeks  Status  On-going            Plan - 06/01/19 [id number]    Clinical Impression Statement  Pt  with slight increased pain with CCW ankle circle, but improved pain with decreased AROM. Pt tolerated ankle circles in both direction, but requires decreased AROM in both directions. Pt with more pain in CCW direction but able to decrease with lessened range. Pt tolerated gentle isometrics in long sitting with therapist providing resistance. Pt with most difficulty with eversion. Added arch doming with significant difficulty. Therapist provided demonstration, verbal and tactile cues with repeated reps to maintain foot contact and improve overall intrinsic foot activation. Pt reports improvement in foot stiffness and denies pain at EOS. Discussed ankle brace wearing, swelling with mobility, icing for comfort, and HEP frequency. Continue to progress as able.    Personal Factors and Comorbidities  Age;Time since onset of injury/illness/exacerbation;Comorbidity 3+    Comorbidities  HTN, GAD, Depression    Examination-Activity Limitations  Stand;Locomotion Level;Stairs;Squat    Examination-Participation Restrictions  Yard Work;Cleaning;Community Activity;Meal Prep;Laundry    Stability/Clinical Decision Making  Evolving/Moderate complexity    Rehab Potential  Good    PT Frequency  2x / week    PT Duration  6 weeks    PT Treatment/Interventions  ADLs/Self Care Home Management;Aquatic Therapy;Cryotherapy;Electrical Stimulation;Moist Heat;DME Instruction;Gait training;Stair training;Functional mobility training;Therapeutic activities;Therapeutic exercise;Balance training;Neuromuscular re-education;Patient/family education;Orthotic Fit/Training;Manual techniques;Passive range of motion;Dry needling;Energy conservation;Taping    PT Next Visit Plan  See MRI results for more detail regarding injury. Focus on edema reduction and improving ankle ROM initially. Expand HEP including active ankle mobility exercises to tolerance. Continue isometric ankle strengthening initially and progress as able. Avoid excessive end range  DF stretching or inversion of ankle due to results of MRI showing tendon tears.    PT Home Exercise Plan  05/26/19: Elevate and ice; 3/25: arch doming, ankle circles CW/CCW    Consulted and Agree with Plan of Care  Patient       Patient will benefit from skilled therapeutic intervention in order to improve the following deficits and impairments:  Abnormal gait, Pain, Decreased mobility, Decreased activity tolerance, Decreased endurance, Decreased range of motion, Decreased strength, Hypomobility, Decreased balance, Difficulty walking, Increased edema  Visit Diagnosis: Stiffness of left ankle, not elsewhere classified  Stiffness of right ankle, not elsewhere classified  Localized edema  Muscle weakness (generalized)     Problem List Patient Active Problem List   Diagnosis Date Noted  . Oral phase dysphagia 01/24/2019  . GERD (gastroesophageal reflux disease) 01/04/2019  . Swollen tongue 12/29/2018  . Dry mouth 12/22/2018  . Dysphagia 12/22/2018  . Hoarseness, chronic 12/22/2018  . Need for zoster vaccine 09/22/2018  . Hemorrhoid 08/11/2017  . Sigmoid diverticulosis 08/11/2017  . Essential hypertension 02/28/2015  . Joint pain 02/28/2015  . Easy bruising 02/19/2014  . Menometrorrhagia 07/14/2013  . Polymenorrhea 07/11/2013  . GAD (generalized anxiety disorder) 01/04/2013  . CMC arthritis, thumb, degenerative 04/02/2011  . 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/02/2011  . Obesity (BMI 30.0-34.9) 10/22/2009  . Depression with anxiety 10/22/2009  . NICOTINE ADDICTION 10/09/2008  . Migraine 02/24/2007    Talbot Grumbling PT, DPT 06/01/19, 9:06 AM [place] [address] [place], [place], [address] Phone: [phone]   Fax:  [phone]  Name: Zoe Bailey MRN: [id number] Date of Birth: 08-13-1965

## 2019-06-06 ENCOUNTER — Ambulatory Visit (HOSPITAL_COMMUNITY): Payer: 59

## 2019-06-06 ENCOUNTER — Encounter (HOSPITAL_COMMUNITY): Payer: Self-pay

## 2019-06-06 ENCOUNTER — Other Ambulatory Visit: Payer: Self-pay

## 2019-06-06 DIAGNOSIS — M25672 Stiffness of left ankle, not elsewhere classified: Secondary | ICD-10-CM | POA: Diagnosis not present

## 2019-06-06 DIAGNOSIS — R600 Localized edema: Secondary | ICD-10-CM

## 2019-06-06 DIAGNOSIS — M25671 Stiffness of right ankle, not elsewhere classified: Secondary | ICD-10-CM | POA: Diagnosis not present

## 2019-06-06 DIAGNOSIS — M6281 Muscle weakness (generalized): Secondary | ICD-10-CM

## 2019-06-06 MED FILL — CYCLOBENZAPRINE HCL 10 MG T: 10 | 10 days supply | Qty: 30 | Fill #1

## 2019-06-06 MED FILL — PHENTERMINE 37.5 MG TABLET: 37.5 | 30 days supply | Qty: 30 | Fill #3

## 2019-06-06 MED FILL — ESOMEPRAZOLE MAG DR 40 MG C: 40 | 30 days supply | Qty: 30 | Fill #3

## 2019-06-06 MED FILL — GABAPENTIN 400 MG CAPSULE: 400 | 90 days supply | Qty: 180 | Fill #2

## 2019-06-06 NOTE — Therapy (Signed)
[place], [place], [address] Phone: [phone]   Fax:  [phone]  Physical Therapy Treatment  Patient Details  Name: Zoe Bailey MRN: [id number] Date of Birth: 1966-02-02 Referring Provider (PT): Persons, Bevely Palmer, [place]   Encounter Date: 06/06/2019  PT End of Session - 06/06/19 0821    Visit Number  3    Number of Visits  12    Date for PT Re-Evaluation  07/07/19    Authorization Type  [facility]    Authorization Time Period  05/26/19-07/07/19 (Auth required after 25th visit)    Authorization - Visit Number  3    Authorization - Number of Visits  25    Progress Note Due on Visit  10    PT Start Time  [id number]    PT Stop Time  0859    PT Time Calculation (min)  40 min    Equipment Utilized During Treatment  --   L ankle brace   Activity Tolerance  Patient tolerated treatment well;No increased pain    Behavior During Therapy  WFL for tasks assessed/performed       Past Medical History:  Diagnosis Date  . Arthritis   . Cancer ([place]) 2010 approx   skin cancer  . Complication of anesthesia    Wakes up during all surgeries  . COPD (chronic obstructive pulmonary disease) ([place])   . Depression   . Foot pain, right 03/2014  . Migraine    USES BOTOX FOR TREATMENT   . Shortness of breath dyspnea   . Sleep apnea   . Toenail fungus     Past Surgical History:  Procedure Laterality Date  . ANKLE FRACTURE SURGERY Right   . BREAST ENHANCEMENT SURGERY    . CARPAL TUNNEL RELEASE Right    x2  . CHONDROPLASTY Right 02/17/2018   Procedure: CHONDROPLASTY;  Surgeon: Paralee Cancel, MD;  [facility];  Service: Orthopedics;  Laterality: Right;  . COLONOSCOPY WITH PROPOFOL N/A 05/07/2017   Procedure: COLONOSCOPY WITH PROPOFOL;  Surgeon: Rogene Houston, MD;  [facility];  Service: Endoscopy;  Laterality: N/A;  8:30  . ESOPHAGOGASTRODUODENOSCOPY (EGD) WITH PROPOFOL N/A 03/06/2019   Procedure:  ESOPHAGOGASTRODUODENOSCOPY (EGD) WITH PROPOFOL;  Surgeon: Rogene Houston, MD;  [facility];  Service: Endoscopy;  Laterality: N/A;  . HERNIA REPAIR     umbilical  . KNEE ARTHROSCOPY WITH MEDIAL MENISECTOMY Right 02/17/2018   Procedure: KNEE ARTHROSCOPY WITH MEDIAL MENISECTOMY;  Surgeon: Paralee Cancel, MD;  [facility];  Service: Orthopedics;  Laterality: Right;  . MALONEY DILATION  03/06/2019   Procedure: MALONEY DILATION;  Surgeon: Rogene Houston, MD;  [facility];  Service: Endoscopy;;  . NASAL POLYP SURGERY    . POLYPECTOMY      There were no vitals filed for this visit.  Subjective Assessment - 06/06/19 0820    Subjective  Pt reports pain this morning due to walking a lot yesterday.    Pertinent History  History of broken left foot 4 years ago per patient report    Limitations  House hold activities;Standing;Walking    How long can you sit comfortably?  Not limited    How long can you stand comfortably?  2.5-3 hours    How long can you walk comfortably?  10-20 minutes of walking    Diagnostic tests  MRI: see chart review    Patient Stated Goals  To not have to  have surgery    Currently in Pain?  Yes    Pain Score  5     Pain Location  Ankle    Pain Orientation  Left           [facility] Adult PT Treatment/Exercise - 06/06/19 0001      Ambulation/Gait   Gait Comments  Gait training in hallway, focus on weight-shifting, increased L pes planus with weight-bearing with minimal increased pain at ATFL site      Manual Therapy   Manual Therapy  Edema management;Soft tissue mobilization    Manual therapy comments  completed seperate from rest of treatment interventions    Edema Management  pt supine, positioned with BLE elevated on wedge, edema massage to promote fluid return to heart with intermittent ankle pumps    Soft tissue mobilization  pt supine, BLE elevated on wedge, STM to plantar fascitis, gastroc/soleus complex to decrease pain and improve  flexiblity      Ankle Exercises: Seated   Ankle Circles/Pumps  AROM;Left;20 reps    Ankle Circles/Pumps Limitations  2 sets, CW/CCW      Ankle Exercises: Standing   SLS  each LE, 30 sec    Heel Raises  Both;15 reps             PT Education - 06/06/19 0821    Education Details  Exercise technique, continue HEP, swelling with increasing mobility, importance of brace for stability early on and improtance of progressing out of it when muscles strengthen, continue RICE    Person(s) Educated  Patient    Methods  Explanation;Demonstration    Comprehension  Verbalized understanding;Returned demonstration       PT Short Term Goals - 06/01/19 [id number]      PT SHORT TERM GOAL #1   Title  Patient will demonstrate understanding and report regular compliance with HEP to improve mobility.    Time  3    Period  Weeks    Status  On-going    Target Date  06/16/19      PT SHORT TERM GOAL #2   Title  Patient will report an improvement of at least 50% in overall symptoms indicating an improvement in QOL.    Time  3    Period  Weeks    Status  On-going    Target Date  06/16/19        PT Long Term Goals - 06/01/19 [id number]      PT LONG TERM GOAL #1   Title  Patient will demonstrate improvement in left ankle AROM of at least 5 degrees in all deficient planes of motion to improve gait mechanics and decrease pain.    Time  6    Period  Weeks    Status  On-going      PT LONG TERM GOAL #2   Title  Patient will demonstrate ability to maintain single limb stance for at least 5 seconds on each LE in order to improve safety with stair negotiation.    Time  6    Period  Weeks    Status  On-going      PT LONG TERM GOAL #3   Title  Patient will report that her pain has not exceeded a 3/10 maximum over the course of a 1 week period indicating an improvement in tolerance to daily activities.    Time  6    Period  Weeks    Status  On-going      PT LONG TERM  GOAL #4   Title  Patient will report  ability to ambulate for at least 30 minutes in order to perform shopping activities with improved ease.    Time  6    Period  Weeks    Status  On-going      PT LONG TERM GOAL #5   Title  Patient will report an improvement of at least 75% in overall symptoms indicating an improvement in QOL.    Time  6    Period  Weeks    Status  On-going            Plan - 06/06/19 [id number]    Clinical Impression Statement  Began with seated ankle AROM to improve mobility and reduce pain, but pain continues. Added manual STM to L ankle at gastroc/soleus and plantar fasciitis with edema management to reduce swelling. Pt with improvement in pain after manual and able to complete standing exercises. Pt demonstrates mild unsteadiness at ankle with L SLS compared to R SLS. Pt tolerates stand heel raises without increased pain demo slow, controlled raise and lowering for concentric and eccentric strengthening of ankle. Pt reports improvement in pain at EOS. Continued education on brace wearing, RICE, transitioning into dress shoe wear per pt goal with short durations, and HEP. Continue to progress as able.    Personal Factors and Comorbidities  Age;Time since onset of injury/illness/exacerbation;Comorbidity 3+    Comorbidities  HTN, GAD, Depression    Examination-Activity Limitations  Stand;Locomotion Level;Stairs;Squat    Examination-Participation Restrictions  Yard Work;Cleaning;Community Activity;Meal Prep;Laundry    Stability/Clinical Decision Making  Evolving/Moderate complexity    Rehab Potential  Good    PT Frequency  2x / week    PT Duration  6 weeks    PT Treatment/Interventions  ADLs/Self Care Home Management;Aquatic Therapy;Cryotherapy;Electrical Stimulation;Moist Heat;DME Instruction;Gait training;Stair training;Functional mobility training;Therapeutic activities;Therapeutic exercise;Balance training;Neuromuscular re-education;Patient/family education;Orthotic Fit/Training;Manual techniques;Passive range  of motion;Dry needling;Energy conservation;Taping    PT Next Visit Plan  See MRI results for more detail regarding injury. Focus on edema reduction and improving ankle ROM, isometric strengthening, standing exercises as able. Expand HEP including active ankle mobility exercises to tolerance. Avoid excessive end range DF stretching or inversion of ankle due to results of MRI showing tendon tears.    PT Home Exercise Plan  05/26/19: Elevate and ice; 3/25: arch doming, ankle circles CW/CCW    Consulted and Agree with Plan of Care  Patient       Patient will benefit from skilled therapeutic intervention in order to improve the following deficits and impairments:  Abnormal gait, Pain, Decreased mobility, Decreased activity tolerance, Decreased endurance, Decreased range of motion, Decreased strength, Hypomobility, Decreased balance, Difficulty walking, Increased edema  Visit Diagnosis: Stiffness of left ankle, not elsewhere classified  Stiffness of right ankle, not elsewhere classified  Localized edema  Muscle weakness (generalized)     Problem List Patient Active Problem List   Diagnosis Date Noted  . Oral phase dysphagia 01/24/2019  . GERD (gastroesophageal reflux disease) 01/04/2019  . Swollen tongue 12/29/2018  . Dry mouth 12/22/2018  . Dysphagia 12/22/2018  . Hoarseness, chronic 12/22/2018  . Need for zoster vaccine 09/22/2018  . Hemorrhoid 08/11/2017  . Sigmoid diverticulosis 08/11/2017  . Essential hypertension 02/28/2015  . Joint pain 02/28/2015  . Easy bruising 02/19/2014  . Menometrorrhagia 07/14/2013  . Polymenorrhea 07/11/2013  . GAD (generalized anxiety disorder) 01/04/2013  . CMC arthritis, thumb, degenerative 04/02/2011  . 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/02/2011  . Obesity (BMI 30.0-34.9)  10/22/2009  . Depression with anxiety 10/22/2009  . NICOTINE ADDICTION 10/09/2008  . Migraine 02/24/2007   Talbot Grumbling PT, DPT 06/06/19, 9:07  AM [place], [place], [address] Phone: [phone]   Fax:  [phone]  Name: Zoe Bailey MRN: [id number] Date of Birth: 1965/11/23

## 2019-06-09 ENCOUNTER — Ambulatory Visit (HOSPITAL_COMMUNITY): Payer: 59 | Attending: Physician Assistant | Admitting: Physical Therapy

## 2019-06-09 ENCOUNTER — Encounter (HOSPITAL_COMMUNITY): Payer: Self-pay | Admitting: Physical Therapy

## 2019-06-09 ENCOUNTER — Other Ambulatory Visit: Payer: Self-pay

## 2019-06-09 DIAGNOSIS — M25672 Stiffness of left ankle, not elsewhere classified: Secondary | ICD-10-CM | POA: Diagnosis not present

## 2019-06-09 DIAGNOSIS — M25671 Stiffness of right ankle, not elsewhere classified: Secondary | ICD-10-CM | POA: Insufficient documentation

## 2019-06-09 DIAGNOSIS — R29898 Other symptoms and signs involving the musculoskeletal system: Secondary | ICD-10-CM | POA: Diagnosis not present

## 2019-06-09 DIAGNOSIS — M79642 Pain in left hand: Secondary | ICD-10-CM | POA: Diagnosis not present

## 2019-06-09 DIAGNOSIS — M6281 Muscle weakness (generalized): Secondary | ICD-10-CM | POA: Insufficient documentation

## 2019-06-09 DIAGNOSIS — M79641 Pain in right hand: Secondary | ICD-10-CM | POA: Insufficient documentation

## 2019-06-09 DIAGNOSIS — R600 Localized edema: Secondary | ICD-10-CM | POA: Diagnosis not present

## 2019-06-09 NOTE — Therapy (Signed)
[place], [place], [address] Phone: [phone]   Fax:  [phone]  Physical Therapy Treatment  Patient Details  Name: Zoe Bailey MRN: [id number] Date of Birth: September 28, 1965 Referring Provider (PT): Persons, Bevely Palmer, [place]   Encounter Date: 06/09/2019  PT End of Session - 06/09/19 1319    Visit Number  4    Number of Visits  12    Date for PT Re-Evaluation  07/07/19    Authorization Type  [facility]    Authorization Time Period  05/26/19-07/07/19 (Auth required after 25th visit)    Authorization - Visit Number  4    Authorization - Number of Visits  25    Progress Note Due on Visit  10    PT Start Time  1259    PT Stop Time  1337    PT Time Calculation (min)  38 min    Equipment Utilized During Treatment  --   L ankle brace   Activity Tolerance  Patient tolerated treatment well;No increased pain    Behavior During Therapy  WFL for tasks assessed/performed       Past Medical History:  Diagnosis Date  . Arthritis   . Cancer ([place]) 2010 approx   skin cancer  . Complication of anesthesia    Wakes up during all surgeries  . COPD (chronic obstructive pulmonary disease) ([place])   . Depression   . Foot pain, right 03/2014  . Migraine    USES BOTOX FOR TREATMENT   . Shortness of breath dyspnea   . Sleep apnea   . Toenail fungus     Past Surgical History:  Procedure Laterality Date  . ANKLE FRACTURE SURGERY Right   . BREAST ENHANCEMENT SURGERY    . CARPAL TUNNEL RELEASE Right    x2  . CHONDROPLASTY Right 02/17/2018   Procedure: CHONDROPLASTY;  Surgeon: Paralee Cancel, MD;  Location: [facility];  Service: Orthopedics;  Laterality: Right;  . COLONOSCOPY WITH PROPOFOL N/A 05/07/2017   Procedure: COLONOSCOPY WITH PROPOFOL;  Surgeon: Rogene Houston, MD;  [facility];  Service: Endoscopy;  Laterality: N/A;  8:30  . ESOPHAGOGASTRODUODENOSCOPY (EGD) WITH PROPOFOL N/A 03/06/2019   Procedure:  ESOPHAGOGASTRODUODENOSCOPY (EGD) WITH PROPOFOL;  Surgeon: Rogene Houston, MD;  [facility];  Service: Endoscopy;  Laterality: N/A;  . HERNIA REPAIR     umbilical  . KNEE ARTHROSCOPY WITH MEDIAL MENISECTOMY Right 02/17/2018   Procedure: KNEE ARTHROSCOPY WITH MEDIAL MENISECTOMY;  Surgeon: Paralee Cancel, MD;  Location: [facility];  Service: Orthopedics;  Laterality: Right;  . MALONEY DILATION  03/06/2019   Procedure: MALONEY DILATION;  Surgeon: Rogene Houston, MD;  [facility];  Service: Endoscopy;;  . NASAL POLYP SURGERY    . POLYPECTOMY      There were no vitals filed for this visit.  Subjective Assessment - 06/09/19 1302    Subjective  Patient reported that after she had therapy treatment she felt a little like she had overdone it, but then the next day she felt 10x better.    Pertinent History  History of broken left foot 4 years ago per patient report    Limitations  House hold activities;Standing;Walking    How long can you sit comfortably?  Not limited    How long can you stand comfortably?  2.5-3 hours    How long can you walk comfortably?  10-20 minutes of walking    Diagnostic tests  MRI: see chart review    Patient Stated Goals  To not have to have surgery    Currently in Pain?  No/denies                       [facility] Adult PT Treatment/Exercise - 06/09/19 0001      Ambulation/Gait   Gait Comments  Gait training in hallway, focus on weight-shifting, increased L pes planus with weight-bearing with minimal increased pain at ATFL site      Manual Therapy   Manual Therapy  Edema management;Soft tissue mobilization    Manual therapy comments  completed seperate from rest of treatment interventions    Edema Management  pt supine, positioned with BLE elevated on wedge, edema massage to promote fluid return to heart with intermittent ankle pumps    Soft tissue mobilization  pt supine, BLE elevated on wedge, STM to plantar fascitis,  gastroc/soleus complex to decrease pain and improve flexiblity      Ankle Exercises: Seated   Ankle Circles/Pumps  AROM;Left;10 reps    Ankle Circles/Pumps Limitations  2 sets, CW/CCW    BAPS  Level 2;Sitting;10 reps   A/P, and INV/EV              PT Short Term Goals - 06/01/19 [id number]      PT SHORT TERM GOAL #1   Title  Patient will demonstrate understanding and report regular compliance with HEP to improve mobility.    Time  3    Period  Weeks    Status  On-going    Target Date  06/16/19      PT SHORT TERM GOAL #2   Title  Patient will report an improvement of at least 50% in overall symptoms indicating an improvement in QOL.    Time  3    Period  Weeks    Status  On-going    Target Date  06/16/19        PT Long Term Goals - 06/01/19 [id number]      PT LONG TERM GOAL #1   Title  Patient will demonstrate improvement in left ankle AROM of at least 5 degrees in all deficient planes of motion to improve gait mechanics and decrease pain.    Time  6    Period  Weeks    Status  On-going      PT LONG TERM GOAL #2   Title  Patient will demonstrate ability to maintain single limb stance for at least 5 seconds on each LE in order to improve safety with stair negotiation.    Time  6    Period  Weeks    Status  On-going      PT LONG TERM GOAL #3   Title  Patient will report that her pain has not exceeded a 3/10 maximum over the course of a 1 week period indicating an improvement in tolerance to daily activities.    Time  6    Period  Weeks    Status  On-going      PT LONG TERM GOAL #4   Title  Patient will report ability to ambulate for at least 30 minutes in order to perform shopping activities with improved ease.    Time  6    Period  Weeks    Status  On-going      PT LONG TERM GOAL #5   Title  Patient will report an improvement of at least 75% in overall symptoms  indicating an improvement in QOL.    Time  6    Period  Weeks    Status  On-going             Plan - 06/09/19 1338    Clinical Impression Statement  Focused on ankle mobility this session. Added seated BAPS board this session which patient required cueing for proper performance. Patient was challenged particularly with ankle inversion and eversion on the [employer]. Ended session with manual therapy to decrease restrictions and improve ankle mobility. Patient reported feeling very good following manual. Patient would benefit from continued skilled physical therapy to continue progressing towards functional goals.    Personal Factors and Comorbidities  Age;Time since onset of injury/illness/exacerbation;Comorbidity 3+    Comorbidities  HTN, GAD, Depression    Examination-Activity Limitations  Stand;Locomotion Level;Stairs;Squat    Examination-Participation Restrictions  Yard Work;Cleaning;Community Activity;Meal Prep;Laundry    Stability/Clinical Decision Making  Evolving/Moderate complexity    Rehab Potential  Good    PT Frequency  2x / week    PT Duration  6 weeks    PT Treatment/Interventions  ADLs/Self Care Home Management;Aquatic Therapy;Cryotherapy;Electrical Stimulation;Moist Heat;DME Instruction;Gait training;Stair training;Functional mobility training;Therapeutic activities;Therapeutic exercise;Balance training;Neuromuscular re-education;Patient/family education;Orthotic Fit/Training;Manual techniques;Passive range of motion;Dry needling;Energy conservation;Taping    PT Next Visit Plan  See MRI results for more detail regarding injury. Focus on edema reduction and improving ankle ROM, isometric strengthening, standing exercises as able. Expand HEP including active ankle mobility exercises to tolerance. Avoid excessive end range DF stretching or inversion of ankle due to results of MRI showing tendon tears.    PT Home Exercise Plan  05/26/19: Elevate and ice; 3/25: arch doming, ankle circles CW/CCW    Consulted and Agree with Plan of Care  Patient       Patient will  benefit from skilled therapeutic intervention in order to improve the following deficits and impairments:  Abnormal gait, Pain, Decreased mobility, Decreased activity tolerance, Decreased endurance, Decreased range of motion, Decreased strength, Hypomobility, Decreased balance, Difficulty walking, Increased edema  Visit Diagnosis: Stiffness of left ankle, not elsewhere classified  Stiffness of right ankle, not elsewhere classified  Localized edema  Muscle weakness (generalized)     Problem List Patient Active Problem List   Diagnosis Date Noted  . Oral phase dysphagia 01/24/2019  . GERD (gastroesophageal reflux disease) 01/04/2019  . Swollen tongue 12/29/2018  . Dry mouth 12/22/2018  . Dysphagia 12/22/2018  . Hoarseness, chronic 12/22/2018  . Need for zoster vaccine 09/22/2018  . Hemorrhoid 08/11/2017  . Sigmoid diverticulosis 08/11/2017  . Essential hypertension 02/28/2015  . Joint pain 02/28/2015  . Easy bruising 02/19/2014  . Menometrorrhagia 07/14/2013  . Polymenorrhea 07/11/2013  . GAD (generalized anxiety disorder) 01/04/2013  . CMC arthritis, thumb, degenerative 04/02/2011  . 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/02/2011  . Obesity (BMI 30.0-34.9) 10/22/2009  . Depression with anxiety 10/22/2009  . NICOTINE ADDICTION 10/09/2008  . Migraine 02/24/2007   Clarene Critchley PT, DPT 1:40 PM, 06/09/19 [place], [place], [address] Phone: [phone]   Fax:  [phone]  Name: Zoe Bailey MRN: [id number] Date of Birth: November 04, 1965

## 2019-06-12 ENCOUNTER — Ambulatory Visit: Payer: 59 | Admitting: Family Medicine

## 2019-06-13 ENCOUNTER — Ambulatory Visit (HOSPITAL_COMMUNITY): Payer: 59 | Admitting: Physical Therapy

## 2019-06-13 ENCOUNTER — Other Ambulatory Visit: Payer: Self-pay

## 2019-06-13 ENCOUNTER — Encounter (HOSPITAL_COMMUNITY): Payer: Self-pay | Admitting: Physical Therapy

## 2019-06-13 DIAGNOSIS — R600 Localized edema: Secondary | ICD-10-CM | POA: Diagnosis not present

## 2019-06-13 DIAGNOSIS — M25672 Stiffness of left ankle, not elsewhere classified: Secondary | ICD-10-CM

## 2019-06-13 DIAGNOSIS — M79642 Pain in left hand: Secondary | ICD-10-CM | POA: Diagnosis not present

## 2019-06-13 DIAGNOSIS — M79641 Pain in right hand: Secondary | ICD-10-CM | POA: Diagnosis not present

## 2019-06-13 DIAGNOSIS — M6281 Muscle weakness (generalized): Secondary | ICD-10-CM

## 2019-06-13 DIAGNOSIS — M25671 Stiffness of right ankle, not elsewhere classified: Secondary | ICD-10-CM | POA: Diagnosis not present

## 2019-06-13 DIAGNOSIS — R29898 Other symptoms and signs involving the musculoskeletal system: Secondary | ICD-10-CM | POA: Diagnosis not present

## 2019-06-13 NOTE — Therapy (Addendum)
[place], [place], [address] Phone: [phone]   Fax:  [phone]  Physical Therapy Treatment  Patient Details  Name: Zoe Bailey MRN: [id number] Date of Birth: 12/27/1965 Referring Provider (PT): Persons, Bevely Palmer, [place]   Encounter Date: 06/13/2019  PT End of Session - 06/13/19 0822    Visit Number  5    Number of Visits  12    Date for PT Re-Evaluation  07/07/19    Authorization Type  [facility]    Authorization Time Period  05/26/19-07/07/19 (Auth required after 25th visit)    Authorization - Visit Number  5    Authorization - Number of Visits  25    Progress Note Due on Visit  10    PT Start Time  0817    PT Stop Time  0855    PT Time Calculation (min)  38 min    Equipment Utilized During Treatment  --   L ankle brace   Activity Tolerance  Patient tolerated treatment well;No increased pain    Behavior During Therapy  WFL for tasks assessed/performed       Past Medical History:  Diagnosis Date  . Arthritis   . Cancer ([place]) 2010 approx   skin cancer  . Complication of anesthesia    Wakes up during all surgeries  . COPD (chronic obstructive pulmonary disease) ([place])   . Depression   . Foot pain, right 03/2014  . Migraine    USES BOTOX FOR TREATMENT   . Shortness of breath dyspnea   . Sleep apnea   . Toenail fungus     Past Surgical History:  Procedure Laterality Date  . ANKLE FRACTURE SURGERY Right   . BREAST ENHANCEMENT SURGERY    . CARPAL TUNNEL RELEASE Right    x2  . CHONDROPLASTY Right 02/17/2018   Procedure: CHONDROPLASTY;  Surgeon: Paralee Cancel, MD;  [facility];  Service: Orthopedics;  Laterality: Right;  . COLONOSCOPY WITH PROPOFOL N/A 05/07/2017   Procedure: COLONOSCOPY WITH PROPOFOL;  Surgeon: Rogene Houston, MD;  [facility];  Service: Endoscopy;  Laterality: N/A;  8:30  . ESOPHAGOGASTRODUODENOSCOPY (EGD) WITH PROPOFOL N/A 03/06/2019   Procedure:  ESOPHAGOGASTRODUODENOSCOPY (EGD) WITH PROPOFOL;  Surgeon: Rogene Houston, MD;  [facility];  Service: Endoscopy;  Laterality: N/A;  . HERNIA REPAIR     umbilical  . KNEE ARTHROSCOPY WITH MEDIAL MENISECTOMY Right 02/17/2018   Procedure: KNEE ARTHROSCOPY WITH MEDIAL MENISECTOMY;  Surgeon: Paralee Cancel, MD;  [facility];  Service: Orthopedics;  Laterality: Right;  . MALONEY DILATION  03/06/2019   Procedure: MALONEY DILATION;  Surgeon: Rogene Houston, MD;  [facility];  Service: Endoscopy;;  . NASAL POLYP SURGERY    . POLYPECTOMY      There were no vitals filed for this visit.  Subjective Assessment - 06/13/19 0820    Subjective  Patient reported that she is feeling good today. Reported a little pain yesterday because she was on it a lot, but today denies any pain.    Pertinent History  History of broken left foot 4 years ago per patient report    Limitations  House hold activities;Standing;Walking    How long can you sit comfortably?  Not limited    How long can you stand comfortably?  2.5-3 hours    How long can you walk comfortably?  10-20 minutes of walking    Diagnostic tests  MRI:  see chart review    Patient Stated Goals  To not have to have surgery    Currently in Pain?  No/denies                    06/13/19 0001  Manual Therapy  Manual Therapy Edema management;Soft tissue mobilization  Manual therapy comments completed seperate from rest of treatment interventions  Edema Management pt supine, positioned with BLE elevated on wedge, edema massage to promote fluid return to heart with intermittent ankle pumps  Soft tissue mobilization pt supine, BLE elevated on wedge, STM to plantar fascitis, gastroc/soleus complex to decrease pain and improve flexiblity  Ankle Exercises: Seated  Ankle Circles/Pumps AROM;Left;10 reps  Ankle Circles/Pumps Limitations 2 sets, CW/CCW  BAPS Level 2;Sitting;10 reps (A/P and INV/EV)  Other Seated Ankle  Exercises Ankle isometrics x10 Inversion, eversion, PF, DF 5'' holds with small ball         PT Education - 06/13/19 0859    Education Details  Educated on self-massage and upadated HEP.    Person(s) Educated  Patient    Methods  Explanation;Handout    Comprehension  Verbalized understanding       PT Short Term Goals - 06/01/19 [id number]      PT SHORT TERM GOAL #1   Title  Patient will demonstrate understanding and report regular compliance with HEP to improve mobility.    Time  3    Period  Weeks    Status  On-going    Target Date  06/16/19      PT SHORT TERM GOAL #2   Title  Patient will report an improvement of at least 50% in overall symptoms indicating an improvement in QOL.    Time  3    Period  Weeks    Status  On-going    Target Date  06/16/19        PT Long Term Goals - 06/01/19 [id number]      PT LONG TERM GOAL #1   Title  Patient will demonstrate improvement in left ankle AROM of at least 5 degrees in all deficient planes of motion to improve gait mechanics and decrease pain.    Time  6    Period  Weeks    Status  On-going      PT LONG TERM GOAL #2   Title  Patient will demonstrate ability to maintain single limb stance for at least 5 seconds on each LE in order to improve safety with stair negotiation.    Time  6    Period  Weeks    Status  On-going      PT LONG TERM GOAL #3   Title  Patient will report that her pain has not exceeded a 3/10 maximum over the course of a 1 week period indicating an improvement in tolerance to daily activities.    Time  6    Period  Weeks    Status  On-going      PT LONG TERM GOAL #4   Title  Patient will report ability to ambulate for at least 30 minutes in order to perform shopping activities with improved ease.    Time  6    Period  Weeks    Status  On-going      PT LONG TERM GOAL #5   Title  Patient will report an improvement of at least 75% in overall symptoms indicating an improvement in QOL.    Time  6  Period   Weeks    Status  On-going            Plan - 06/13/19 0900    Clinical Impression Statement  Continued a focus on mobility and strengthening this session. Added isometric ankle strengthening this session and added it to the patient's HEP. Patient demonstrated increased difficulty with ankle inversion with the isometric exercises, but overall good understanding. Ended with manual therapy with patient reporting it feeling good at the end of the session. Also educated the patient on self-performance of retrograde massage.    Personal Factors and Comorbidities  Age;Time since onset of injury/illness/exacerbation;Comorbidity 3+    Comorbidities  HTN, GAD, Depression    Examination-Activity Limitations  Stand;Locomotion Level;Stairs;Squat    Examination-Participation Restrictions  Yard Work;Cleaning;Community Activity;Meal Prep;Laundry    Stability/Clinical Decision Making  Evolving/Moderate complexity    Rehab Potential  Good    PT Frequency  2x / week    PT Duration  6 weeks    PT Treatment/Interventions  ADLs/Self Care Home Management;Aquatic Therapy;Cryotherapy;Electrical Stimulation;Moist Heat;DME Instruction;Gait training;Stair training;Functional mobility training;Therapeutic activities;Therapeutic exercise;Balance training;Neuromuscular re-education;Patient/family education;Orthotic Fit/Training;Manual techniques;Passive range of motion;Dry needling;Energy conservation;Taping    PT Next Visit Plan  See MRI results for more detail regarding injury. Focus on edema reduction and improving ankle ROM, standing exercises as able. Progress to theraband exercises as able. Avoid excessive end range DF stretching or inversion of ankle due to results of MRI showing tendon tears.    PT Home Exercise Plan  05/26/19: Elevate and ice; 3/25: arch doming, ankle circles CW/CCW; 06/13/19: Ankle isometrics 4 directions 10x daily    Consulted and Agree with Plan of Care  Patient       Patient will benefit from  skilled therapeutic intervention in order to improve the following deficits and impairments:  Abnormal gait, Pain, Decreased mobility, Decreased activity tolerance, Decreased endurance, Decreased range of motion, Decreased strength, Hypomobility, Decreased balance, Difficulty walking, Increased edema  Visit Diagnosis: Stiffness of left ankle, not elsewhere classified  Stiffness of right ankle, not elsewhere classified  Localized edema  Muscle weakness (generalized)     Problem List Patient Active Problem List   Diagnosis Date Noted  . Oral phase dysphagia 01/24/2019  . GERD (gastroesophageal reflux disease) 01/04/2019  . Swollen tongue 12/29/2018  . Dry mouth 12/22/2018  . Dysphagia 12/22/2018  . Hoarseness, chronic 12/22/2018  . Need for zoster vaccine 09/22/2018  . Hemorrhoid 08/11/2017  . Sigmoid diverticulosis 08/11/2017  . Essential hypertension 02/28/2015  . Joint pain 02/28/2015  . Easy bruising 02/19/2014  . Menometrorrhagia 07/14/2013  . Polymenorrhea 07/11/2013  . GAD (generalized anxiety disorder) 01/04/2013  . CMC arthritis, thumb, degenerative 04/02/2011  . 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/02/2011  . Obesity (BMI 30.0-34.9) 10/22/2009  . Depression with anxiety 10/22/2009  . NICOTINE ADDICTION 10/09/2008  . Migraine 02/24/2007   Clarene Critchley PT, DPT 9:03 AM, 06/13/19 [place], [place], [address] Phone: [phone]   Fax:  [phone]  Name: Zoe Bailey MRN: [id number] Date of Birth: 07/26/1965

## 2019-06-13 NOTE — Patient Instructions (Signed)
[id number] [place] access code

## 2019-06-15 ENCOUNTER — Encounter (HOSPITAL_COMMUNITY): Payer: Self-pay | Admitting: Physical Therapy

## 2019-06-15 ENCOUNTER — Other Ambulatory Visit: Payer: Self-pay

## 2019-06-15 ENCOUNTER — Ambulatory Visit (HOSPITAL_COMMUNITY): Payer: 59 | Admitting: Physical Therapy

## 2019-06-15 DIAGNOSIS — M25671 Stiffness of right ankle, not elsewhere classified: Secondary | ICD-10-CM | POA: Diagnosis not present

## 2019-06-15 DIAGNOSIS — R29898 Other symptoms and signs involving the musculoskeletal system: Secondary | ICD-10-CM | POA: Diagnosis not present

## 2019-06-15 DIAGNOSIS — M25672 Stiffness of left ankle, not elsewhere classified: Secondary | ICD-10-CM

## 2019-06-15 DIAGNOSIS — M6281 Muscle weakness (generalized): Secondary | ICD-10-CM | POA: Diagnosis not present

## 2019-06-15 DIAGNOSIS — M79642 Pain in left hand: Secondary | ICD-10-CM | POA: Diagnosis not present

## 2019-06-15 DIAGNOSIS — M79641 Pain in right hand: Secondary | ICD-10-CM | POA: Diagnosis not present

## 2019-06-15 DIAGNOSIS — R600 Localized edema: Secondary | ICD-10-CM | POA: Diagnosis not present

## 2019-06-15 NOTE — Therapy (Signed)
[place], [place], [address] Phone: [phone]   Fax:  [phone]  Physical Therapy Treatment  Patient Details  Name: Zoe Bailey MRN: [id number] Date of Birth: [birth year] Referring Provider (PT): Persons, Bevely Palmer, [place]   Encounter Date: 06/15/2019  PT End of Session - 06/15/19 0826    Visit Number  6    Number of Visits  12    Date for PT Re-Evaluation  07/07/19    Authorization Type  [facility]    Authorization Time Period  05/26/19-07/07/19 (Auth required after 25th visit)    Authorization - Visit Number  6    Authorization - Number of Visits  25    Progress Note Due on Visit  10    PT Start Time  0820    PT Stop Time  0858    PT Time Calculation (min)  38 min    Activity Tolerance  Patient tolerated treatment well;No increased pain    Behavior During Therapy  WFL for tasks assessed/performed       Past Medical History:  Diagnosis Date  . Arthritis   . Cancer ([place]) 2010 approx   skin cancer  . Complication of anesthesia    Wakes up during all surgeries  . COPD (chronic obstructive pulmonary disease) ([place])   . Depression   . Foot pain, right 03/2014  . Migraine    USES BOTOX FOR TREATMENT   . Shortness of breath dyspnea   . Sleep apnea   . Toenail fungus     Past Surgical History:  Procedure Laterality Date  . ANKLE FRACTURE SURGERY Right   . BREAST ENHANCEMENT SURGERY    . CARPAL TUNNEL RELEASE Right    x2  . CHONDROPLASTY Right 02/17/2018   Procedure: CHONDROPLASTY;  Surgeon: Paralee Cancel, MD;  [facility];  Service: Orthopedics;  Laterality: Right;  . COLONOSCOPY WITH PROPOFOL N/A 05/07/2017   Procedure: COLONOSCOPY WITH PROPOFOL;  Surgeon: Rogene Houston, MD;  [facility];  Service: Endoscopy;  Laterality: N/A;  8:30  . ESOPHAGOGASTRODUODENOSCOPY (EGD) WITH PROPOFOL N/A 03/06/2019   Procedure: ESOPHAGOGASTRODUODENOSCOPY (EGD) WITH PROPOFOL;  Surgeon: Rogene Houston,  MD;  [facility];  Service: Endoscopy;  Laterality: N/A;  . HERNIA REPAIR     umbilical  . KNEE ARTHROSCOPY WITH MEDIAL MENISECTOMY Right 02/17/2018   Procedure: KNEE ARTHROSCOPY WITH MEDIAL MENISECTOMY;  Surgeon: Paralee Cancel, MD;  [facility];  Service: Orthopedics;  Laterality: Right;  . MALONEY DILATION  03/06/2019   Procedure: MALONEY DILATION;  Surgeon: Rogene Houston, MD;  [facility];  Service: Endoscopy;;  . NASAL POLYP SURGERY    . POLYPECTOMY      There were no vitals filed for this visit.  Subjective Assessment - 06/15/19 0825    Subjective  Patient reported feeling really good. Reported that she is trying to not wear her brace today.    Pertinent History  History of broken left foot 4 years ago per patient report    Limitations  House hold activities;Standing;Walking    How long can you sit comfortably?  Not limited    How long can you stand comfortably?  2.5-3 hours    How long can you walk comfortably?  10-20 minutes of walking    Diagnostic tests  MRI: see chart review    Patient Stated Goals  To not have to have surgery    Currently in Pain?  No/denies                       [facility] Adult PT Treatment/Exercise - 06/15/19 0001      Manual Therapy   Manual Therapy  Edema management;Soft tissue mobilization    Manual therapy comments  completed seperate from rest of treatment interventions    Edema Management  pt supine, positioned with BLE elevated on wedge, edema massage to promote fluid return to heart with intermittent ankle pumps    Soft tissue mobilization  pt supine, BLE elevated on wedge, STM to plantar fascitis, gastroc/soleus complex to decrease pain and improve flexiblity      Ankle Exercises: Seated   Towel Inversion/Eversion  --   10x eversion and inversion   Other Seated Ankle Exercises  Ankle isometrics x10 Inversion, eversion, PF, DF 5'' holds with small ball               PT Short Term Goals -  06/01/19 [id number]      PT SHORT TERM GOAL #1   Title  Patient will demonstrate understanding and report regular compliance with HEP to improve mobility.    Time  3    Period  Weeks    Status  On-going    Target Date  06/16/19      PT SHORT TERM GOAL #2   Title  Patient will report an improvement of at least 50% in overall symptoms indicating an improvement in QOL.    Time  3    Period  Weeks    Status  On-going    Target Date  06/16/19        PT Long Term Goals - 06/01/19 [id number]      PT LONG TERM GOAL #1   Title  Patient will demonstrate improvement in left ankle AROM of at least 5 degrees in all deficient planes of motion to improve gait mechanics and decrease pain.    Time  6    Period  Weeks    Status  On-going      PT LONG TERM GOAL #2   Title  Patient will demonstrate ability to maintain single limb stance for at least 5 seconds on each LE in order to improve safety with stair negotiation.    Time  6    Period  Weeks    Status  On-going      PT LONG TERM GOAL #3   Title  Patient will report that her pain has not exceeded a 3/10 maximum over the course of a 1 week period indicating an improvement in tolerance to daily activities.    Time  6    Period  Weeks    Status  On-going      PT LONG TERM GOAL #4   Title  Patient will report ability to ambulate for at least 30 minutes in order to perform shopping activities with improved ease.    Time  6    Period  Weeks    Status  On-going      PT LONG TERM GOAL #5   Title  Patient will report an improvement of at least 75% in overall symptoms indicating an improvement in QOL.    Time  6    Period  Weeks    Status  On-going            Plan - 06/15/19 0902    Clinical Impression Statement  Continued to focus on ankle mobility this session.  Added towel inversion/eversion to improve AROM. Patient required minimal cueing for proper form with isometrics this date to initiate exercise, but demonstrated good carry-over of  information with more repetitions. Ended with manual therapy.  Patient reported feeling good throughout therapy session.    Personal Factors and Comorbidities  Age;Time since onset of injury/illness/exacerbation;Comorbidity 3+    Comorbidities  HTN, GAD, Depression    Examination-Activity Limitations  Stand;Locomotion Level;Stairs;Squat    Examination-Participation Restrictions  Yard Work;Cleaning;Community Activity;Meal Prep;Laundry    Stability/Clinical Decision Making  Evolving/Moderate complexity    Rehab Potential  Good    PT Frequency  2x / week    PT Duration  6 weeks    PT Treatment/Interventions  ADLs/Self Care Home Management;Aquatic Therapy;Cryotherapy;Electrical Stimulation;Moist Heat;DME Instruction;Gait training;Stair training;Functional mobility training;Therapeutic activities;Therapeutic exercise;Balance training;Neuromuscular re-education;Patient/family education;Orthotic Fit/Training;Manual techniques;Passive range of motion;Dry needling;Energy conservation;Taping    PT Next Visit Plan  See MRI results for more detail regarding injury. Focus on edema reduction and improving ankle ROM, standing exercises as able (heel raise with ball). Progress to theraband exercises as able. Avoid excessive end range DF stretching or inversion of ankle due to results of MRI showing tendon tears.    PT Home Exercise Plan  05/26/19: Elevate and ice; 3/25: arch doming, ankle circles CW/CCW; 06/13/19: Ankle isometrics 4 directions 10x daily    Consulted and Agree with Plan of Care  Patient       Patient will benefit from skilled therapeutic intervention in order to improve the following deficits and impairments:  Abnormal gait, Pain, Decreased mobility, Decreased activity tolerance, Decreased endurance, Decreased range of motion, Decreased strength, Hypomobility, Decreased balance, Difficulty walking, Increased edema  Visit Diagnosis: Stiffness of left ankle, not elsewhere classified  Stiffness of  right ankle, not elsewhere classified  Localized edema  Muscle weakness (generalized)     Problem List Patient Active Problem List   Diagnosis Date Noted  . Oral phase dysphagia 01/24/2019  . GERD (gastroesophageal reflux disease) 01/04/2019  . Swollen tongue 12/29/2018  . Dry mouth 12/22/2018  . Dysphagia 12/22/2018  . Hoarseness, chronic 12/22/2018  . Need for zoster vaccine 09/22/2018  . Hemorrhoid 08/11/2017  . Sigmoid diverticulosis 08/11/2017  . Essential hypertension 02/28/2015  . Joint pain 02/28/2015  . Easy bruising 02/19/2014  . Menometrorrhagia 07/14/2013  . Polymenorrhea 07/11/2013  . GAD (generalized anxiety disorder) 01/04/2013  . CMC arthritis, thumb, degenerative 04/02/2011  . 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/02/2011  . Obesity (BMI 30.0-34.9) 10/22/2009  . Depression with anxiety 10/22/2009  . NICOTINE ADDICTION 10/09/2008  . Migraine 02/24/2007   Zoe Bailey PT, DPT 9:04 AM, 06/15/19 [place], [place], [address] Phone: [phone]   Fax:  [phone]  Name: Zoe Bailey MRN: [id number] Date of Birth: May 29, 1965

## 2019-06-20 ENCOUNTER — Encounter (HOSPITAL_COMMUNITY): Payer: Self-pay | Admitting: Physical Therapy

## 2019-06-20 ENCOUNTER — Encounter: Payer: Self-pay | Admitting: Family Medicine

## 2019-06-20 ENCOUNTER — Other Ambulatory Visit: Payer: Self-pay

## 2019-06-20 ENCOUNTER — Ambulatory Visit (HOSPITAL_COMMUNITY): Payer: 59 | Admitting: Physical Therapy

## 2019-06-20 ENCOUNTER — Ambulatory Visit (INDEPENDENT_AMBULATORY_CARE_PROVIDER_SITE_OTHER): Payer: 59 | Admitting: Family Medicine

## 2019-06-20 VITALS — BP 128/90 | HR 106 | Temp 97.1°F | Resp 16 | Ht 62.0 in | Wt 176.0 lb

## 2019-06-20 DIAGNOSIS — M6281 Muscle weakness (generalized): Secondary | ICD-10-CM | POA: Diagnosis not present

## 2019-06-20 DIAGNOSIS — M25671 Stiffness of right ankle, not elsewhere classified: Secondary | ICD-10-CM

## 2019-06-20 DIAGNOSIS — R600 Localized edema: Secondary | ICD-10-CM

## 2019-06-20 DIAGNOSIS — M25672 Stiffness of left ankle, not elsewhere classified: Secondary | ICD-10-CM

## 2019-06-20 DIAGNOSIS — M25869 Other specified joint disorders, unspecified knee: Secondary | ICD-10-CM

## 2019-06-20 DIAGNOSIS — R29898 Other symptoms and signs involving the musculoskeletal system: Secondary | ICD-10-CM | POA: Diagnosis not present

## 2019-06-20 DIAGNOSIS — R296 Repeated falls: Secondary | ICD-10-CM | POA: Diagnosis not present

## 2019-06-20 DIAGNOSIS — G8929 Other chronic pain: Secondary | ICD-10-CM

## 2019-06-20 DIAGNOSIS — M79642 Pain in left hand: Secondary | ICD-10-CM | POA: Diagnosis not present

## 2019-06-20 DIAGNOSIS — M79641 Pain in right hand: Secondary | ICD-10-CM | POA: Diagnosis not present

## 2019-06-20 MED ORDER — METHYLPREDNISOLONE ACETATE 80 MG/ML IJ SUSP
80.0000 mg | Freq: Once | INTRAMUSCULAR | Status: AC
  Administered 2019-06-20: 80 mg via INTRAMUSCULAR

## 2019-06-20 MED ORDER — KETOROLAC TROMETHAMINE 60 MG/2ML IM SOLN
60.0000 mg | Freq: Once | INTRAMUSCULAR | Status: AC
  Administered 2019-06-20: 14:00:00 60 mg via INTRAMUSCULAR

## 2019-06-20 NOTE — Progress Notes (Signed)
Subjective:  Patient ID: DIOSELIN EDKIN, female    DOB: 18-Jul-1965  Age: 54 y.o. MRN: [id number]  CC:  Chief Complaint  Patient presents with  . Hand Pain    bilateral hand pain. Left she needs carpal tunnel surgery and the right she needs a hand replacement       HPI  HPI  Ms Bowes is a 54 year old who presents today with ongoing hand pain and discomfort. She reports that she has had carpal tunnel off and on over the years with progression in discomfort. She reports that she needs to have a hand reconstruction/placement and surgery. But this will take her out of work for about 9 months and she is unable to do that at this time.  She reports her pain is located in her hands and wrist. Described as constant, sharp, weakness and painful at times. Has difficulty with gripping and picking items up. Nothing has produced relief. Usually hurts all day long now. Is on gabapentin high dose without much improvement. Is open to having referral to pain clinic.  Of note she reports that she has had surgery on her left foot secondary to torn ligaments and tendons. She reports that she has had multiple her history. When questioned why she is fallen she reports she does not know. I have asked if she is willing to be seen by neurology for work-up of this and she reports that she is. She denies any use of alcohol or during falls. Reports that her mother used to follow as well.  Today patient denies signs and symptoms of COVID 19 infection including fever, chills, cough, shortness of breath, and headache. Past Medical, Surgical, Social History, Allergies, and Medications have been Reviewed.   Past Medical History:  Diagnosis Date  . Arthritis   . Cancer ([place]) 2010 approx   skin cancer  . Complication of anesthesia    Wakes up during all surgeries  . COPD (chronic obstructive pulmonary disease) ([place])   . Depression   . Foot pain, right 03/2014  . Migraine    USES BOTOX FOR TREATMENT   . Shortness  of breath dyspnea   . Sleep apnea   . Toenail fungus     Current Meds  Medication Sig  . buPROPion (WELLBUTRIN XL) 300 MG 24 hr tablet TAKE 1 TABLET BY MOUTH DAILY.  . busPIRone (BUSPAR) 10 MG tablet TAKE 1 TABLET BY MOUTH 3 TIMES DAILY.  . cyclobenzaprine (FLEXERIL) 10 MG tablet TAKE 1 TABLET BY MOUTH 3 TIMES DAILY AS NEEDED FOR MUSCLE SPASMS.  Marland Kitchen diclofenac (VOLTAREN) 75 MG EC tablet TAKE 1 TABLET BY MOUTH ONCE DAILY AS NEEDED FOR PAIN  . esomeprazole (NEXIUM) 40 MG capsule Take 1 capsule (40 mg total) by mouth daily before breakfast.  . gabapentin (NEURONTIN) 400 MG capsule Take two capsules by mouth every morning for migraine prevention (Patient taking differently: Take 800 mg by mouth every morning. Take two capsules by mouth every morning for migraine prevention)  . Multiple Vitamin (MULTIVITAMIN WITH MINERALS) TABS tablet Take 2 tablets by mouth daily.   . OnabotulinumtoxinA (BOTOX IM) Inject 1 Dose into the muscle every 3 (three) months.  . phentermine (ADIPEX-P) 37.5 MG tablet Take 1 tablet (37.5 mg total) by mouth daily before breakfast.  . tiotropium (SPIRIVA HANDIHALER) 18 MCG inhalation capsule Place 1 capsule (18 mcg total) into inhaler and inhale daily for 30 days. (Patient taking differently: Place 18 mcg into inhaler and inhale daily as needed (shortness  of breath). )  . triamterene-hydrochlorothiazide (MAXZIDE) 75-50 MG tablet Take 1 tablet by mouth daily.  . Turmeric (QC TUMERIC COMPLEX) 500 MG CAPS Take 500 mg by mouth 2 (two) times daily.   Marland Kitchen UNABLE TO FIND Blood pressure monitor x 1  DX I10    ROS:  Review of Systems  Constitutional: Negative.   HENT: Negative.   Eyes: Negative.   Respiratory: Negative.   Cardiovascular: Negative.   Gastrointestinal: Negative.   Genitourinary: Negative.   Musculoskeletal: Positive for joint pain.  Skin: Negative.   Neurological: Negative.   Endo/Heme/Allergies: Negative.   Psychiatric/Behavioral: Negative.   All other systems  reviewed and are negative.    Objective:   Today's Vitals: BP 128/90   Pulse (!) 106   Temp (!) 97.1 F (36.2 C) (Temporal)   Resp 16   Ht 5\' 2"  (1.575 m)   Wt 176 lb (79.8 kg)   LMP 08/21/2017 (Approximate) Comment: preg. test negative  SpO2 97%   BMI 32.19 kg/m  Vitals with BMI 06/20/2019 04/10/2019 03/06/2019  Height 5\' 2"  5\' 2"  -  Weight 176 lbs 168 lbs -  BMI [id number] -  Systolic 0000000 - 99991111  Diastolic 90 - 98  Pulse [id number] - 98     Physical Exam Vitals and nursing note reviewed.  Constitutional:      Appearance: Normal appearance. She is obese.  HENT:     Head: Normocephalic and atraumatic.     Right Ear: External ear normal.     Left Ear: External ear normal.     Mouth/Throat:     Comments: Mask in place Eyes:     General:        Right eye: No discharge.        Left eye: No discharge.     Conjunctiva/sclera: Conjunctivae normal.  Cardiovascular:     Rate and Rhythm: Normal rate and regular rhythm.     Pulses: Normal pulses.     Heart sounds: Normal heart sounds.  Pulmonary:     Effort: Pulmonary effort is normal.     Breath sounds: Normal breath sounds.  Musculoskeletal:     Cervical back: Normal range of motion and neck supple.     Comments: Gloves and braces on both wrist  Brace on Left ankle  Skin:    General: Skin is warm.  Neurological:     General: No focal deficit present.     Mental Status: She is alert and oriented to person, place, and time.  Psychiatric:        Mood and Affect: Mood normal.        Behavior: Behavior normal.        Thought Content: Thought content normal.        Judgment: Judgment normal.    Assessment   1. Other chronic pain   2. Falls frequently   3. Cyst of knee joint, unspecified laterality     Tests ordered Orders Placed This Encounter  Procedures  . Ambulatory referral to Pain Clinic  . Ambulatory referral to Orthopedic Surgery  . Ambulatory referral to Neurology     Plan: Please see assessment and  plan per problem list above.   Meds ordered this encounter  Medications  . methylPREDNISolone acetate (DEPO-MEDROL) injection 80 mg  . ketorolac (TORADOL) injection 60 mg    Patient to follow-up in Visit date not found .  Perlie Mayo, NP

## 2019-06-20 NOTE — Therapy (Signed)
[place], [place], [address] Phone: [phone]   Fax:  [phone]  Physical Therapy Treatment  Patient Details  Name: Zoe Bailey MRN: [id number] Date of Birth: [birth year] Referring Provider (PT): Persons, Bevely Palmer, [place]   Encounter Date: 06/20/2019  PT End of Session - 06/20/19 0911    Visit Number  7    Number of Visits  12    Date for PT Re-Evaluation  07/07/19    Authorization Type  [facility]    Authorization Time Period  05/26/19-07/07/19 (Auth required after 25th visit)    Authorization - Visit Number  7    Authorization - Number of Visits  25    Progress Note Due on Visit  10    PT Start Time  0905    PT Stop Time  0943    PT Time Calculation (min)  38 min    Activity Tolerance  Patient tolerated treatment well;No increased pain    Behavior During Therapy  WFL for tasks assessed/performed       Past Medical History:  Diagnosis Date  . Arthritis   . Cancer ([place]) 2010 approx   skin cancer  . Complication of anesthesia    Wakes up during all surgeries  . COPD (chronic obstructive pulmonary disease) ([place])   . Depression   . Foot pain, right 03/2014  . Migraine    USES BOTOX FOR TREATMENT   . Shortness of breath dyspnea   . Sleep apnea   . Toenail fungus     Past Surgical History:  Procedure Laterality Date  . ANKLE FRACTURE SURGERY Right   . BREAST ENHANCEMENT SURGERY    . CARPAL TUNNEL RELEASE Right    x2  . CHONDROPLASTY Right 02/17/2018   Procedure: CHONDROPLASTY;  Surgeon: Paralee Cancel, MD;  [facility];  Service: Orthopedics;  Laterality: Right;  . COLONOSCOPY WITH PROPOFOL N/A 05/07/2017   Procedure: COLONOSCOPY WITH PROPOFOL;  Surgeon: Rogene Houston, MD;  [facility];  Service: Endoscopy;  Laterality: N/A;  8:30  . ESOPHAGOGASTRODUODENOSCOPY (EGD) WITH PROPOFOL N/A 03/06/2019   Procedure: ESOPHAGOGASTRODUODENOSCOPY (EGD) WITH PROPOFOL;  Surgeon: Rogene Houston, MD;  [facility];  Service: Endoscopy;  Laterality: N/A;  . HERNIA REPAIR     umbilical  . KNEE ARTHROSCOPY WITH MEDIAL MENISECTOMY Right 02/17/2018   Procedure: KNEE ARTHROSCOPY WITH MEDIAL MENISECTOMY;  Surgeon: Paralee Cancel, MD;  [facility];  Service: Orthopedics;  Laterality: Right;  . MALONEY DILATION  03/06/2019   Procedure: MALONEY DILATION;  Surgeon: Rogene Houston, MD;  [facility];  Service: Endoscopy;;  . NASAL POLYP SURGERY    . POLYPECTOMY      There were no vitals filed for this visit.  Subjective Assessment - 06/20/19 0909    Subjective  Patient reported that this weekend she planted some flowers at her house.    Pertinent History  History of broken left foot 4 years ago per patient report    Limitations  House hold activities;Standing;Walking    How long can you sit comfortably?  Not limited    How long can you stand comfortably?  2.5-3 hours    How long can you walk comfortably?  10-20 minutes of walking    Diagnostic tests  MRI: see chart review    Patient Stated Goals  To not have to have surgery    Currently in Pain?  No/denies  [facility] Adult PT Treatment/Exercise - 06/20/19 0001      Manual Therapy   Manual Therapy  Edema management;Soft tissue mobilization    Manual therapy comments  completed seperate from rest of treatment interventions    Edema Management  pt supine, positioned with BLE elevated on wedge    Soft tissue mobilization  pt supine, BLE elevated on wedge, STM to plantar fascitis, gastroc/soleus complex to decrease pain and improve flexiblity      Ankle Exercises: Seated   Towel Inversion/Eversion  --   10x inversion and eversion   Other Seated Ankle Exercises  Ankle isometrics x10 Inversion, eversion, PF, DF 5'' holds with small ball      Ankle Exercises: Standing   Other Standing Ankle Exercises  Heel raise with tennis ball at midfoot x15 2'' raise and lower                PT Short Term Goals - 06/01/19 [id number]      PT SHORT TERM GOAL #1   Title  Patient will demonstrate understanding and report regular compliance with HEP to improve mobility.    Time  3    Period  Weeks    Status  On-going    Target Date  06/16/19      PT SHORT TERM GOAL #2   Title  Patient will report an improvement of at least 50% in overall symptoms indicating an improvement in QOL.    Time  3    Period  Weeks    Status  On-going    Target Date  06/16/19        PT Long Term Goals - 06/01/19 [id number]      PT LONG TERM GOAL #1   Title  Patient will demonstrate improvement in left ankle AROM of at least 5 degrees in all deficient planes of motion to improve gait mechanics and decrease pain.    Time  6    Period  Weeks    Status  On-going      PT LONG TERM GOAL #2   Title  Patient will demonstrate ability to maintain single limb stance for at least 5 seconds on each LE in order to improve safety with stair negotiation.    Time  6    Period  Weeks    Status  On-going      PT LONG TERM GOAL #3   Title  Patient will report that her pain has not exceeded a 3/10 maximum over the course of a 1 week period indicating an improvement in tolerance to daily activities.    Time  6    Period  Weeks    Status  On-going      PT LONG TERM GOAL #4   Title  Patient will report ability to ambulate for at least 30 minutes in order to perform shopping activities with improved ease.    Time  6    Period  Weeks    Status  On-going      PT LONG TERM GOAL #5   Title  Patient will report an improvement of at least 75% in overall symptoms indicating an improvement in QOL.    Time  6    Period  Weeks    Status  On-going            Plan - 06/20/19 1027    Clinical Impression Statement  Progressed strengthening exercises this session. Added standing plantarflexion on bilateral lower extremities with tennis ball between  feet for improved posterior tibialis activation. Patient  reported a stretch with this, but denied any pain with this. Patient would benefit from continued skilled physical therapy to continue progressing towards functional goals.    Personal Factors and Comorbidities  Age;Time since onset of injury/illness/exacerbation;Comorbidity 3+    Comorbidities  HTN, GAD, Depression    Examination-Activity Limitations  Stand;Locomotion Level;Stairs;Squat    Examination-Participation Restrictions  Yard Work;Cleaning;Community Activity;Meal Prep;Laundry    Stability/Clinical Decision Making  Evolving/Moderate complexity    Rehab Potential  Good    PT Frequency  2x / week    PT Duration  6 weeks    PT Treatment/Interventions  ADLs/Self Care Home Management;Aquatic Therapy;Cryotherapy;Electrical Stimulation;Moist Heat;DME Instruction;Gait training;Stair training;Functional mobility training;Therapeutic activities;Therapeutic exercise;Balance training;Neuromuscular re-education;Patient/family education;Orthotic Fit/Training;Manual techniques;Passive range of motion;Dry needling;Energy conservation;Taping    PT Next Visit Plan  See MRI results for more detail regarding injury. Focus on edema reduction and improving ankle ROM, standing exercises as able (heel raise with ball). Progress to theraband exercises as able. Avoid excessive end range DF stretching or inversion of ankle due to results of MRI showing tendon tears.    PT Home Exercise Plan  05/26/19: Elevate and ice; 3/25: arch doming, ankle circles CW/CCW; 06/13/19: Ankle isometrics 4 directions 10x daily    Consulted and Agree with Plan of Care  Patient       Patient will benefit from skilled therapeutic intervention in order to improve the following deficits and impairments:  Abnormal gait, Pain, Decreased mobility, Decreased activity tolerance, Decreased endurance, Decreased range of motion, Decreased strength, Hypomobility, Decreased balance, Difficulty walking, Increased edema  Visit Diagnosis: Stiffness of left  ankle, not elsewhere classified  Stiffness of right ankle, not elsewhere classified  Localized edema  Muscle weakness (generalized)     Problem List Patient Active Problem List   Diagnosis Date Noted  . Oral phase dysphagia 01/24/2019  . GERD (gastroesophageal reflux disease) 01/04/2019  . Swollen tongue 12/29/2018  . Dry mouth 12/22/2018  . Dysphagia 12/22/2018  . Hoarseness, chronic 12/22/2018  . Need for zoster vaccine 09/22/2018  . Hemorrhoid 08/11/2017  . Sigmoid diverticulosis 08/11/2017  . Essential hypertension 02/28/2015  . Joint pain 02/28/2015  . Easy bruising 02/19/2014  . Menometrorrhagia 07/14/2013  . Polymenorrhea 07/11/2013  . GAD (generalized anxiety disorder) 01/04/2013  . CMC arthritis, thumb, degenerative 04/02/2011  . 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/02/2011  . Obesity (BMI 30.0-34.9) 10/22/2009  . Depression with anxiety 10/22/2009  . NICOTINE ADDICTION 10/09/2008  . Migraine 02/24/2007   Clarene Critchley PT, DPT 10:29 AM, 06/20/19 [place], [place], [address] Phone: [phone]   Fax:  [phone]  Name: Zoe Bailey MRN: [id number] Date of Birth: 27-Oct-1965

## 2019-06-20 NOTE — Patient Instructions (Signed)
I appreciate the opportunity to provide you with care for your health and wellness. Today we discussed: hand pain    Follow up: 6 months   No labs   Ortho, Neuro and Pain Clinic referrals today  Injections today for pain   Please continue to practice social distancing to keep you, your family, and our community safe.  If you must go out, please wear a mask and practice good handwashing.  It was a pleasure to see you and I look forward to continuing to work together on your health and well-being. Please do not hesitate to call the office if you need care or have questions about your care.  Have a wonderful day and week. With Gratitude, Cherly Beach, DNP, AGNP-BC

## 2019-06-21 DIAGNOSIS — G8929 Other chronic pain: Secondary | ICD-10-CM | POA: Insufficient documentation

## 2019-06-21 DIAGNOSIS — R296 Repeated falls: Secondary | ICD-10-CM | POA: Insufficient documentation

## 2019-06-21 DIAGNOSIS — M25869 Other specified joint disorders, unspecified knee: Secondary | ICD-10-CM | POA: Insufficient documentation

## 2019-06-21 MED FILL — DICLOFENAC SOD EC 75 MG TAB: 75 | 30 days supply | Qty: 30 | Fill #1

## 2019-06-21 NOTE — Assessment & Plan Note (Signed)
Injections of Toradol and Depo-Medrol provided today for chronic pain. Also referral to pain clinic. Appreciate collaboration in her care.

## 2019-06-21 NOTE — Assessment & Plan Note (Signed)
Referral to neurology as concern for why she reports that she frequently falls. If there are etiology related to MS or some other autoimmune disorder that could be producing these changes and falls. She reports mother also had similar issues. With multiple injuries it would be warranted for further follow-up. Patient acknowledged agreement and understanding of the plan.

## 2019-06-22 ENCOUNTER — Encounter (HOSPITAL_COMMUNITY): Payer: Self-pay | Admitting: Physical Therapy

## 2019-06-22 ENCOUNTER — Ambulatory Visit (HOSPITAL_COMMUNITY): Payer: 59 | Admitting: Physical Therapy

## 2019-06-22 ENCOUNTER — Other Ambulatory Visit: Payer: Self-pay

## 2019-06-22 DIAGNOSIS — M79642 Pain in left hand: Secondary | ICD-10-CM | POA: Diagnosis not present

## 2019-06-22 DIAGNOSIS — R29898 Other symptoms and signs involving the musculoskeletal system: Secondary | ICD-10-CM | POA: Diagnosis not present

## 2019-06-22 DIAGNOSIS — M25672 Stiffness of left ankle, not elsewhere classified: Secondary | ICD-10-CM

## 2019-06-22 DIAGNOSIS — M6281 Muscle weakness (generalized): Secondary | ICD-10-CM | POA: Diagnosis not present

## 2019-06-22 DIAGNOSIS — R600 Localized edema: Secondary | ICD-10-CM | POA: Diagnosis not present

## 2019-06-22 DIAGNOSIS — M25671 Stiffness of right ankle, not elsewhere classified: Secondary | ICD-10-CM

## 2019-06-22 DIAGNOSIS — M79641 Pain in right hand: Secondary | ICD-10-CM | POA: Diagnosis not present

## 2019-06-22 NOTE — Therapy (Signed)
[place], [place], [address] Phone: [phone]   Fax:  [phone]  Physical Therapy Treatment  Patient Details  Name: Zoe Bailey MRN: [id number] Date of Birth: 1965-12-28 Referring Provider (PT): Persons, Bevely Palmer, [place]   Encounter Date: 06/22/2019  PT End of Session - 06/22/19 0828    Visit Number  8    Number of Visits  12    Date for PT Re-Evaluation  07/07/19    Authorization Type  [employer]    Authorization Time Period  05/26/19-07/07/19 (Auth required after 25th visit)    Authorization - Visit Number  8    Authorization - Number of Visits  25    Progress Note Due on Visit  10    PT Start Time  0825   Patient arrived late   PT Stop Time  0900    PT Time Calculation (min)  35 min    Activity Tolerance  Patient tolerated treatment well;No increased pain    Behavior During Therapy  WFL for tasks assessed/performed       Past Medical History:  Diagnosis Date  . Arthritis   . Cancer ([place]) 2010 approx   skin cancer  . Complication of anesthesia    Wakes up during all surgeries  . COPD (chronic obstructive pulmonary disease) ([place])   . Depression   . Foot pain, right 03/2014  . Migraine    USES BOTOX FOR TREATMENT   . Shortness of breath dyspnea   . Sleep apnea   . Toenail fungus     Past Surgical History:  Procedure Laterality Date  . ANKLE FRACTURE SURGERY Right   . BREAST ENHANCEMENT SURGERY    . CARPAL TUNNEL RELEASE Right    x2  . CHONDROPLASTY Right 02/17/2018   Procedure: CHONDROPLASTY;  Surgeon: Paralee Cancel, MD;  [facility]: [facility];  Service: Orthopedics;  Laterality: Right;  . COLONOSCOPY WITH PROPOFOL N/A 05/07/2017   Procedure: COLONOSCOPY WITH PROPOFOL;  Surgeon: Rogene Houston, MD;  [facility];  Service: Endoscopy;  Laterality: N/A;  8:30  . ESOPHAGOGASTRODUODENOSCOPY (EGD) WITH PROPOFOL N/A 03/06/2019   Procedure: ESOPHAGOGASTRODUODENOSCOPY (EGD) WITH PROPOFOL;   Surgeon: Rogene Houston, MD;  [facility];  Service: Endoscopy;  Laterality: N/A;  . HERNIA REPAIR     umbilical  . KNEE ARTHROSCOPY WITH MEDIAL MENISECTOMY Right 02/17/2018   Procedure: KNEE ARTHROSCOPY WITH MEDIAL MENISECTOMY;  Surgeon: Paralee Cancel, MD;  Location: [facility];  Service: Orthopedics;  Laterality: Right;  . MALONEY DILATION  03/06/2019   Procedure: MALONEY DILATION;  Surgeon: Rogene Houston, MD;  [facility];  Service: Endoscopy;;  . NASAL POLYP SURGERY    . POLYPECTOMY      There were no vitals filed for this visit.  Subjective Assessment - 06/22/19 0827    Subjective  Patient reported her foot and ankle is not hurting currently. She reported that she is not wearing her brace today and she did not have to wear it yesterday either. Reported that she is having bilateral hand pain and that she would be interested in occupational therapy.    Pertinent History  History of broken left foot 4 years ago per patient report    Limitations  House hold activities;Standing;Walking    How long can you sit comfortably?  Not limited    How long can you stand comfortably?  2.5-3 hours    How long can you walk  comfortably?  10-20 minutes of walking    Diagnostic tests  MRI: see chart review    Patient Stated Goals  To not have to have surgery    Currently in Pain?  No/denies                       [facility] Adult PT Treatment/Exercise - 06/22/19 0001      Manual Therapy   Manual Therapy  Edema management;Soft tissue mobilization    Manual therapy comments  completed seperate from rest of treatment interventions    Edema Management  pt supine, positioned with BLE elevated on wedge    Soft tissue mobilization  pt supine, BLE elevated on wedge, STM to plantar fascitis, gastroc/soleus complex to decrease pain and improve flexiblity      Ankle Exercises: Seated   Towel Inversion/Eversion  --   10x inversion and eversion     Ankle Exercises: Standing    Other Standing Ankle Exercises  Heel raise with tennis ball at midfoot x15 2'' raise and lower      Ankle Exercises: Supine   T-Band  Ankle inversion and eversion x 10 each direction. Instructed to perform in partial ROM RTB               PT Short Term Goals - 06/01/19 [id number]      PT SHORT TERM GOAL #1   Title  Patient will demonstrate understanding and report regular compliance with HEP to improve mobility.    Time  3    Period  Weeks    Status  On-going    Target Date  06/16/19      PT SHORT TERM GOAL #2   Title  Patient will report an improvement of at least 50% in overall symptoms indicating an improvement in QOL.    Time  3    Period  Weeks    Status  On-going    Target Date  06/16/19        PT Long Term Goals - 06/01/19 [id number]      PT LONG TERM GOAL #1   Title  Patient will demonstrate improvement in left ankle AROM of at least 5 degrees in all deficient planes of motion to improve gait mechanics and decrease pain.    Time  6    Period  Weeks    Status  On-going      PT LONG TERM GOAL #2   Title  Patient will demonstrate ability to maintain single limb stance for at least 5 seconds on each LE in order to improve safety with stair negotiation.    Time  6    Period  Weeks    Status  On-going      PT LONG TERM GOAL #3   Title  Patient will report that her pain has not exceeded a 3/10 maximum over the course of a 1 week period indicating an improvement in tolerance to daily activities.    Time  6    Period  Weeks    Status  On-going      PT LONG TERM GOAL #4   Title  Patient will report ability to ambulate for at least 30 minutes in order to perform shopping activities with improved ease.    Time  6    Period  Weeks    Status  On-going      PT LONG TERM GOAL #5   Title  Patient will report an improvement of at  least 75% in overall symptoms indicating an improvement in QOL.    Time  6    Period  Weeks    Status  On-going            Plan -  06/22/19 1327    Clinical Impression Statement  Patient arrived late to session this date which limited session. This session progressed patient with mobility and strengthening exercises. Progressed to theraband exercises for ankle inversion and eversion which patient denied any increase in pain and performed with good form. Added this to patient's HEP. Patient is reporting bilateral hand pain and therefore therapist is writing a referral to patient's referring provider to obtain a referral to occupational therapy in order to address this.    Personal Factors and Comorbidities  Age;Time since onset of injury/illness/exacerbation;Comorbidity 3+    Comorbidities  HTN, GAD, Depression    Examination-Activity Limitations  Stand;Locomotion Level;Stairs;Squat    Examination-Participation Restrictions  Yard Work;Cleaning;Community Activity;Meal Prep;Laundry    Stability/Clinical Decision Making  Evolving/Moderate complexity    Rehab Potential  Good    PT Frequency  2x / week    PT Duration  6 weeks    PT Treatment/Interventions  ADLs/Self Care Home Management;Aquatic Therapy;Cryotherapy;Electrical Stimulation;Moist Heat;DME Instruction;Gait training;Stair training;Functional mobility training;Therapeutic activities;Therapeutic exercise;Balance training;Neuromuscular re-education;Patient/family education;Orthotic Fit/Training;Manual techniques;Passive range of motion;Dry needling;Energy conservation;Taping    PT Next Visit Plan  See MRI results for more detail regarding injury. Focus on edema reduction and improving ankle ROM, standing exercises as able (heel raise with ball). Theraband all directions next session. F/U if OT referral signed.  Avoid excessive end range DF stretching or inversion of ankle due to results of MRI showing tendon tears.    PT Home Exercise Plan  05/26/19: Elevate and ice; 3/25: arch doming, ankle circles CW/CCW; 06/13/19: Ankle isometrics 4 directions 10x daily; 06/22/19: RTB partial range  ankle iversion and eversion x10    Consulted and Agree with Plan of Care  Patient       Patient will benefit from skilled therapeutic intervention in order to improve the following deficits and impairments:  Abnormal gait, Pain, Decreased mobility, Decreased activity tolerance, Decreased endurance, Decreased range of motion, Decreased strength, Hypomobility, Decreased balance, Difficulty walking, Increased edema  Visit Diagnosis: Stiffness of left ankle, not elsewhere classified  Stiffness of right ankle, not elsewhere classified  Localized edema  Muscle weakness (generalized)     Problem List Patient Active Problem List   Diagnosis Date Noted  . Other chronic pain 06/21/2019  . Falls frequently 06/21/2019  . Cyst of knee joint 06/21/2019  . Oral phase dysphagia 01/24/2019  . GERD (gastroesophageal reflux disease) 01/04/2019  . Swollen tongue 12/29/2018  . Dry mouth 12/22/2018  . Dysphagia 12/22/2018  . Hoarseness, chronic 12/22/2018  . Need for zoster vaccine 09/22/2018  . Hemorrhoid 08/11/2017  . Sigmoid diverticulosis 08/11/2017  . Essential hypertension 02/28/2015  . Joint pain 02/28/2015  . Easy bruising 02/19/2014  . Menometrorrhagia 07/14/2013  . Polymenorrhea 07/11/2013  . GAD (generalized anxiety disorder) 01/04/2013  . CMC arthritis, thumb, degenerative 04/02/2011  . 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/02/2011  . Obesity (BMI 30.0-34.9) 10/22/2009  . Depression with anxiety 10/22/2009  . NICOTINE ADDICTION 10/09/2008  . Migraine 02/24/2007   Zoe Bailey PT, DPT 1:31 PM, 06/22/19 [place], [place], [address] Phone: [phone]   Fax:  [phone]  Name: Zoe Bailey MRN: [id number] Date of Birth: 08-16-1965

## 2019-06-27 ENCOUNTER — Ambulatory Visit (HOSPITAL_COMMUNITY): Payer: 59 | Admitting: Physical Therapy

## 2019-06-27 ENCOUNTER — Other Ambulatory Visit: Payer: Self-pay

## 2019-06-27 ENCOUNTER — Encounter (HOSPITAL_COMMUNITY): Payer: Self-pay | Admitting: Physical Therapy

## 2019-06-27 DIAGNOSIS — R600 Localized edema: Secondary | ICD-10-CM

## 2019-06-27 DIAGNOSIS — M25672 Stiffness of left ankle, not elsewhere classified: Secondary | ICD-10-CM | POA: Diagnosis not present

## 2019-06-27 DIAGNOSIS — M6281 Muscle weakness (generalized): Secondary | ICD-10-CM | POA: Diagnosis not present

## 2019-06-27 DIAGNOSIS — R29898 Other symptoms and signs involving the musculoskeletal system: Secondary | ICD-10-CM | POA: Diagnosis not present

## 2019-06-27 DIAGNOSIS — M25671 Stiffness of right ankle, not elsewhere classified: Secondary | ICD-10-CM | POA: Diagnosis not present

## 2019-06-27 DIAGNOSIS — M79642 Pain in left hand: Secondary | ICD-10-CM | POA: Diagnosis not present

## 2019-06-27 DIAGNOSIS — M79641 Pain in right hand: Secondary | ICD-10-CM | POA: Diagnosis not present

## 2019-06-27 NOTE — Therapy (Signed)
[place], [place], [address] Phone: [phone]   Fax:  [phone]  Physical Therapy Treatment  Patient Details  Name: Zoe Bailey MRN: [id number] Date of Birth: [birth year] Referring Provider (PT): Persons, Bevely Palmer, [place]   Encounter Date: 06/27/2019  PT End of Session - 06/27/19 0823    Visit Number  9    Number of Visits  12    Date for PT Re-Evaluation  07/07/19    Authorization Type  [employer] [facility]    Authorization Time Period  05/26/19-07/07/19 (Auth required after 25th visit)    Authorization - Visit Number  9    Authorization - Number of Visits  25    Progress Note Due on Visit  10    PT Start Time  0816    PT Stop Time  0854    PT Time Calculation (min)  38 min    Activity Tolerance  Patient tolerated treatment well;No increased pain    Behavior During Therapy  WFL for tasks assessed/performed       Past Medical History:  Diagnosis Date  . Arthritis   . Cancer ([place]) 2010 approx   skin cancer  . Complication of anesthesia    Wakes up during all surgeries  . COPD (chronic obstructive pulmonary disease) ([place])   . Depression   . Foot pain, right 03/2014  . Migraine    USES BOTOX FOR TREATMENT   . Shortness of breath dyspnea   . Sleep apnea   . Toenail fungus     Past Surgical History:  Procedure Laterality Date  . ANKLE FRACTURE SURGERY Right   . BREAST ENHANCEMENT SURGERY    . CARPAL TUNNEL RELEASE Right    x2  . CHONDROPLASTY Right 02/17/2018   Procedure: CHONDROPLASTY;  Surgeon: Paralee Cancel, MD;  [facility];  Service: Orthopedics;  Laterality: Right;  . COLONOSCOPY WITH PROPOFOL N/A 05/07/2017   Procedure: COLONOSCOPY WITH PROPOFOL;  Surgeon: Rogene Houston, MD;  [facility];  Service: Endoscopy;  Laterality: N/A;  8:30  . ESOPHAGOGASTRODUODENOSCOPY (EGD) WITH PROPOFOL N/A 03/06/2019   Procedure: ESOPHAGOGASTRODUODENOSCOPY (EGD) WITH PROPOFOL;  Surgeon: Rogene Houston, MD;  [facility];  Service: Endoscopy;  Laterality: N/A;  . HERNIA REPAIR     umbilical  . KNEE ARTHROSCOPY WITH MEDIAL MENISECTOMY Right 02/17/2018   Procedure: KNEE ARTHROSCOPY WITH MEDIAL MENISECTOMY;  Surgeon: Paralee Cancel, MD;  [facility];  Service: Orthopedics;  Laterality: Right;  . MALONEY DILATION  03/06/2019   Procedure: MALONEY DILATION;  Surgeon: Rogene Houston, MD;  [facility];  Service: Endoscopy;;  . NASAL POLYP SURGERY    . POLYPECTOMY      There were no vitals filed for this visit.  Subjective Assessment - 06/27/19 [id number]    Subjective  Patient reported that her foot feels good, and that she has not worn her brace the last 3 days.    Pertinent History  History of broken left foot 4 years ago per patient report    Limitations  House hold activities;Standing;Walking    How long can you sit comfortably?  Not limited    How long can you stand comfortably?  2.5-3 hours    How long can you walk comfortably?  10-20 minutes of walking    Diagnostic tests  MRI: see chart review    Patient Stated Goals  To not have to have surgery  Currently in Pain?  No/denies                       [facility] Adult PT Treatment/Exercise - 06/27/19 0001      Manual Therapy   Manual Therapy  Edema management;Soft tissue mobilization    Manual therapy comments  completed seperate from rest of treatment interventions    Edema Management  pt supine, positioned with BLE elevated on wedge    Soft tissue mobilization  pt supine, BLE elevated on wedge, STM to plantar fascitis, gastroc/soleus complex to decrease pain and improve flexiblity      Ankle Exercises: Seated   Towel Inversion/Eversion  --   10x inversion and eversion     Ankle Exercises: Standing   Other Standing Ankle Exercises  Heel raise with tennis ball at midfoot x15 2'' raise and lower      Ankle Exercises: Supine   T-Band  Ankle inversion and eversion, PT, and DF x 10 each  direction. Instructed to perform in partial ROM RTB               PT Short Term Goals - 06/01/19 [id number]      PT SHORT TERM GOAL #1   Title  Patient will demonstrate understanding and report regular compliance with HEP to improve mobility.    Time  3    Period  Weeks    Status  On-going    Target Date  06/16/19      PT SHORT TERM GOAL #2   Title  Patient will report an improvement of at least 50% in overall symptoms indicating an improvement in QOL.    Time  3    Period  Weeks    Status  On-going    Target Date  06/16/19        PT Long Term Goals - 06/01/19 [id number]      PT LONG TERM GOAL #1   Title  Patient will demonstrate improvement in left ankle AROM of at least 5 degrees in all deficient planes of motion to improve gait mechanics and decrease pain.    Time  6    Period  Weeks    Status  On-going      PT LONG TERM GOAL #2   Title  Patient will demonstrate ability to maintain single limb stance for at least 5 seconds on each LE in order to improve safety with stair negotiation.    Time  6    Period  Weeks    Status  On-going      PT LONG TERM GOAL #3   Title  Patient will report that her pain has not exceeded a 3/10 maximum over the course of a 1 week period indicating an improvement in tolerance to daily activities.    Time  6    Period  Weeks    Status  On-going      PT LONG TERM GOAL #4   Title  Patient will report ability to ambulate for at least 30 minutes in order to perform shopping activities with improved ease.    Time  6    Period  Weeks    Status  On-going      PT LONG TERM GOAL #5   Title  Patient will report an improvement of at least 75% in overall symptoms indicating an improvement in QOL.    Time  6    Period  Weeks    Status  On-going  Plan - 06/27/19 0903    Clinical Impression Statement  This session continued with controlled ankle strengthening. Added theraband plantarflexion and dorsiflexion. Patient tolerated these  well and demonstrated slow and controlled movement. Ended with manual to improve muscle extensibility, promote relaxation, and decrease edema. Patient reported feeling very good at the end of the session.    Personal Factors and Comorbidities  Age;Time since onset of injury/illness/exacerbation;Comorbidity 3+    Comorbidities  HTN, GAD, Depression    Examination-Activity Limitations  Stand;Locomotion Level;Stairs;Squat    Examination-Participation Restrictions  Yard Work;Cleaning;Community Activity;Meal Prep;Laundry    Stability/Clinical Decision Making  Evolving/Moderate complexity    Rehab Potential  Good    PT Frequency  2x / week    PT Duration  6 weeks    PT Treatment/Interventions  ADLs/Self Care Home Management;Aquatic Therapy;Cryotherapy;Electrical Stimulation;Moist Heat;DME Instruction;Gait training;Stair training;Functional mobility training;Therapeutic activities;Therapeutic exercise;Balance training;Neuromuscular re-education;Patient/family education;Orthotic Fit/Training;Manual techniques;Passive range of motion;Dry needling;Energy conservation;Taping    PT Next Visit Plan  Progress note next session. See MRI results for more detail regarding injury. Focus on edema reduction and improving ankle ROM, standing exercises as able (heel raise with ball). Theraband all directions next session. F/U if OT referral signed.  Avoid excessive end range DF stretching or inversion of ankle due to results of MRI showing tendon tears.    PT Home Exercise Plan  05/26/19: Elevate and ice; 3/25: arch doming, ankle circles CW/CCW; 06/13/19: Ankle isometrics 4 directions 10x daily; 06/22/19: RTB partial range ankle iversion and eversion x10    Consulted and Agree with Plan of Care  Patient       Patient will benefit from skilled therapeutic intervention in order to improve the following deficits and impairments:  Abnormal gait, Pain, Decreased mobility, Decreased activity tolerance, Decreased endurance,  Decreased range of motion, Decreased strength, Hypomobility, Decreased balance, Difficulty walking, Increased edema  Visit Diagnosis: Stiffness of left ankle, not elsewhere classified  Stiffness of right ankle, not elsewhere classified  Localized edema  Muscle weakness (generalized)     Problem List Patient Active Problem List   Diagnosis Date Noted  . Other chronic pain 06/21/2019  . Falls frequently 06/21/2019  . Cyst of knee joint 06/21/2019  . Oral phase dysphagia 01/24/2019  . GERD (gastroesophageal reflux disease) 01/04/2019  . Swollen tongue 12/29/2018  . Dry mouth 12/22/2018  . Dysphagia 12/22/2018  . Hoarseness, chronic 12/22/2018  . Need for zoster vaccine 09/22/2018  . Hemorrhoid 08/11/2017  . Sigmoid diverticulosis 08/11/2017  . Essential hypertension 02/28/2015  . Joint pain 02/28/2015  . Easy bruising 02/19/2014  . Menometrorrhagia 07/14/2013  . Polymenorrhea 07/11/2013  . GAD (generalized anxiety disorder) 01/04/2013  . CMC arthritis, thumb, degenerative 04/02/2011  . 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/02/2011  . Obesity (BMI 30.0-34.9) 10/22/2009  . Depression with anxiety 10/22/2009  . NICOTINE ADDICTION 10/09/2008  . Migraine 02/24/2007   Clarene Critchley PT, DPT 9:04 AM, 06/27/19 [place], [place], [address] Phone: [phone]   Fax:  [phone]  Name: JAMAE PRESSWOOD MRN: [id number] Date of Birth: April 18, 1965

## 2019-06-29 ENCOUNTER — Encounter (HOSPITAL_COMMUNITY): Payer: Self-pay | Admitting: Physical Therapy

## 2019-06-29 ENCOUNTER — Other Ambulatory Visit: Payer: Self-pay

## 2019-06-29 ENCOUNTER — Telehealth: Payer: Self-pay | Admitting: Physician Assistant

## 2019-06-29 ENCOUNTER — Ambulatory Visit (HOSPITAL_COMMUNITY): Payer: 59 | Admitting: Physical Therapy

## 2019-06-29 DIAGNOSIS — M6281 Muscle weakness (generalized): Secondary | ICD-10-CM | POA: Diagnosis not present

## 2019-06-29 DIAGNOSIS — M7672 Peroneal tendinitis, left leg: Secondary | ICD-10-CM

## 2019-06-29 DIAGNOSIS — M25671 Stiffness of right ankle, not elsewhere classified: Secondary | ICD-10-CM | POA: Diagnosis not present

## 2019-06-29 DIAGNOSIS — R29898 Other symptoms and signs involving the musculoskeletal system: Secondary | ICD-10-CM | POA: Diagnosis not present

## 2019-06-29 DIAGNOSIS — R600 Localized edema: Secondary | ICD-10-CM | POA: Diagnosis not present

## 2019-06-29 DIAGNOSIS — M25672 Stiffness of left ankle, not elsewhere classified: Secondary | ICD-10-CM | POA: Diagnosis not present

## 2019-06-29 DIAGNOSIS — M79641 Pain in right hand: Secondary | ICD-10-CM | POA: Diagnosis not present

## 2019-06-29 DIAGNOSIS — M79642 Pain in left hand: Secondary | ICD-10-CM | POA: Diagnosis not present

## 2019-06-29 NOTE — Therapy (Signed)
[facility] [address] [place], [place], [address] Phone: [phone]   Fax:  [phone]  Physical Therapy Treatment / Progress note  Patient Details  Name: Zoe Bailey MRN: [id number] Date of Birth: February 12, 1966 Referring Provider (PT): Persons, Bevely Palmer, [place]   Encounter Date: 06/29/2019   Progress Note Reporting Period 05/26/19 to 06/29/19  See note below for Objective Data and Assessment of Progress/Goals.       PT End of Session - 06/29/19 0908    Visit Number  10    Number of Visits  16    Date for PT Re-Evaluation  07/20/19    Authorization Type  [place] [employer] Josem Kaufmann required after 25th visit)    Authorization Time Period  --    Authorization - Visit Number  10    Authorization - Number of Visits  25    Progress Note Due on Visit  20    PT Start Time  [phone]    PT Stop Time  0900    PT Time Calculation (min)  44 min    Activity Tolerance  Patient tolerated treatment well;No increased pain    Behavior During Therapy  WFL for tasks assessed/performed       Past Medical History:  Diagnosis Date  . Arthritis   . Cancer ([place]) 2010 approx   skin cancer  . Complication of anesthesia    Wakes up during all surgeries  . COPD (chronic obstructive pulmonary disease) ([place])   . Depression   . Foot pain, right 03/2014  . Migraine    USES BOTOX FOR TREATMENT   . Shortness of breath dyspnea   . Sleep apnea   . Toenail fungus     Past Surgical History:  Procedure Laterality Date  . ANKLE FRACTURE SURGERY Right   . BREAST ENHANCEMENT SURGERY    . CARPAL TUNNEL RELEASE Right    x2  . CHONDROPLASTY Right 02/17/2018   Procedure: CHONDROPLASTY;  Surgeon: Paralee Cancel, MD;  [facility];  Service: Orthopedics;  Laterality: Right;  . COLONOSCOPY WITH PROPOFOL N/A 05/07/2017   Procedure: COLONOSCOPY WITH PROPOFOL;  Surgeon: Rogene Houston, MD;  [facility];  Service: Endoscopy;  Laterality: N/A;  8:30  .  ESOPHAGOGASTRODUODENOSCOPY (EGD) WITH PROPOFOL N/A 03/06/2019   Procedure: ESOPHAGOGASTRODUODENOSCOPY (EGD) WITH PROPOFOL;  Surgeon: Rogene Houston, MD;  [facility];  Service: Endoscopy;  Laterality: N/A;  . HERNIA REPAIR     umbilical  . KNEE ARTHROSCOPY WITH MEDIAL MENISECTOMY Right 02/17/2018   Procedure: KNEE ARTHROSCOPY WITH MEDIAL MENISECTOMY;  Surgeon: Paralee Cancel, MD;  [facility]: [facility];  Service: Orthopedics;  Laterality: Right;  . MALONEY DILATION  03/06/2019   Procedure: MALONEY DILATION;  Surgeon: Rogene Houston, MD;  [facility];  Service: Endoscopy;;  . NASAL POLYP SURGERY    . POLYPECTOMY      There were no vitals filed for this visit.  Subjective Assessment - 06/29/19 [id number]    Subjective  Patient reported 0/10 pain in her foot and ankle currently. Reported no more than 3/10 pain over the last week. She reported an improvement of nearly 100% since beginning therapy. She stated that she is able to walk for at least 30 minutes now and does not have to wear her brace as much.    Pertinent History  History of broken left foot 4 years ago per patient report    Limitations  House hold activities;Standing;Walking  How long can you sit comfortably?  Not limited    How long can you stand comfortably?  2.5-3 hours    How long can you walk comfortably?  10-20 minutes of walking    Diagnostic tests  MRI: see chart review    Patient Stated Goals  To not have to have surgery    Currently in Pain?  No/denies         [facility] PT Assessment - 06/29/19 0001      Assessment   Medical Diagnosis  Peroneal tendonitis    Referring Provider (PT)  Persons, Bevely Palmer, [place]    Onset Date/Surgical Date  --   October 2020     Prior Function   Level of Independence  Independent;Independent with basic ADLs    Vocation  Full time employment    Vocation Requirements  Standing and walking      Cognition   Overall Cognitive Status  Within Functional Limits for tasks  assessed      AROM   Right Ankle Dorsiflexion  10   was 10   Right Ankle Plantar Flexion  60   was 40   Right Ankle Inversion  35   was 30   Right Ankle Eversion  25   was 15   Left Ankle Dorsiflexion  1   was lacking 10   Left Ankle Plantar Flexion  50   was 23   Left Ankle Inversion  32   was 15   Left Ankle Eversion  30   was 25     Strength   Right Ankle Plantar Flexion  4+/5    Left Ankle Plantar Flexion  --   Patient unable to perform 1 single leg Heel raise on LT     Static Standing Balance   Static Standing - Balance Support  No upper extremity supported    Static Standing Balance -  Activities   Single Leg Stance - Right Leg;Single Leg Stance - Left Leg    Static Standing - Comment/# of Minutes  LT: 6 seconds; RT: 8 seconds                   [facility] Adult PT Treatment/Exercise - 06/29/19 0001      Manual Therapy   Manual Therapy  Edema management;Joint mobilization    Manual therapy comments  completed seperate from rest of treatment interventions    Edema Management  Pt supine retrograde massage to LT LE    Joint Mobilization  PT supine AP Talocrural joint to increase ankle mobility grade III. General TC distraction to improve mobility grade II.               PT Short Term Goals - 06/29/19 [id number]      PT SHORT TERM GOAL #1   Title  Patient will demonstrate understanding and report regular compliance with HEP to improve mobility.    Time  3    Period  Weeks    Status  Achieved    Target Date  06/16/19      PT SHORT TERM GOAL #2   Title  Patient will report an improvement of at least 50% in overall symptoms indicating an improvement in QOL.    Time  3    Period  Weeks    Status  Achieved    Target Date  06/16/19        PT Long Term Goals - 06/29/19 0921      PT LONG TERM  GOAL #1   Title  Patient will demonstrate improvement in left ankle AROM of at least 5 degrees in all deficient planes of motion to improve gait mechanics and  decrease pain.    Time  6    Period  Weeks    Status  Achieved      PT LONG TERM GOAL #2   Title  Patient will demonstrate ability to maintain single limb stance for at least 5 seconds on each LE in order to improve safety with stair negotiation.    Time  6    Period  Weeks    Status  Achieved      PT LONG TERM GOAL #3   Title  Patient will report that her pain has not exceeded a 3/10 maximum over the course of a 1 week period indicating an improvement in tolerance to daily activities.    Time  6    Period  Weeks    Status  Achieved      PT LONG TERM GOAL #4   Title  Patient will report ability to ambulate for at least 30 minutes in order to perform shopping activities with improved ease.    Time  6    Period  Weeks    Status  Achieved      PT LONG TERM GOAL #5   Title  Patient will report an improvement of at least 75% in overall symptoms indicating an improvement in QOL.    Time  6    Period  Weeks    Status  Achieved      Additional Long Term Goals   Additional Long Term Goals  Yes      PT LONG TERM GOAL #6   Title  Patient will report that her pain has not exceeded a 1/10 maximum over the course of a 1 week period indicating an improvement in tolerance to daily activities.    Time  3    Period  Weeks    Status  New    Target Date  07/20/19      PT LONG TERM GOAL #7   Title  Patient will demonstrate ability to perform at least 1 single leg heel raise on the LT LE indicating improved plantarflexion strength to return to recreational walking for exercise.    Time  3    Period  Weeks    Status  New    Target Date  07/20/19      PT LONG TERM GOAL #8   Title  Patient will demonstrate ability to perform single limb stance for at least 15 seconds on the LT LE indicating improved muscular stability and balance.    Time  3    Period  Weeks    Status  New    Target Date  07/20/19            Plan - 06/29/19 [id number]    Clinical Impression Statement  Performed a  re-assessment of patient's progress towards goals this session. Patient achieved all initial short term and long term goals. Although the patient has made excellent progress in therapy, she continues to demonstrate some weakness in left plantarflexors and decreased mobility of the left ankle as well as continued balance, and reports pain in the anterior aspect of the ankle at times. Patient would benefit from continued skilled physical therapy to continue progressing towards functional goals. Ended session with manual therapy to improve ankle mobility as well as edema reduction.    Personal  Factors and Comorbidities  Age;Time since onset of injury/illness/exacerbation;Comorbidity 3+    Comorbidities  HTN, GAD, Depression    Examination-Activity Limitations  Stand;Locomotion Level;Stairs;Squat    Examination-Participation Restrictions  Yard Work;Cleaning;Community Activity;Meal Prep;Laundry    Stability/Clinical Decision Making  Evolving/Moderate complexity    Rehab Potential  Good    PT Frequency  2x / week    PT Duration  3 weeks    PT Treatment/Interventions  ADLs/Self Care Home Management;Aquatic Therapy;Cryotherapy;Electrical Stimulation;Moist Heat;DME Instruction;Gait training;Stair training;Functional mobility training;Therapeutic activities;Therapeutic exercise;Balance training;Neuromuscular re-education;Patient/family education;Orthotic Fit/Training;Manual techniques;Passive range of motion;Dry needling;Energy conservation;Taping    PT Next Visit Plan  Perform FOTO and figure 8 measurement. Work on single leg balance, slow progression of plantarflexion strengthening and dorsiflexion mobility. See MRI results for more detail regarding injury. Avoid excessive end range DF stretching or inversion of ankle due to results of MRI showing tendon tears.    PT Home Exercise Plan  05/26/19: Elevate and ice; 3/25: arch doming, ankle circles CW/CCW; 06/13/19: Ankle isometrics 4 directions 10x daily; 06/22/19: RTB  partial range ankle iversion and eversion x10    Consulted and Agree with Plan of Care  Patient       Patient will benefit from skilled therapeutic intervention in order to improve the following deficits and impairments:  Abnormal gait, Pain, Decreased mobility, Decreased activity tolerance, Decreased endurance, Decreased range of motion, Decreased strength, Hypomobility, Decreased balance, Difficulty walking, Increased edema  Visit Diagnosis: Stiffness of left ankle, not elsewhere classified  Localized edema  Muscle weakness (generalized)     Problem List Patient Active Problem List   Diagnosis Date Noted  . Other chronic pain 06/21/2019  . Falls frequently 06/21/2019  . Cyst of knee joint 06/21/2019  . Oral phase dysphagia 01/24/2019  . GERD (gastroesophageal reflux disease) 01/04/2019  . Swollen tongue 12/29/2018  . Dry mouth 12/22/2018  . Dysphagia 12/22/2018  . Hoarseness, chronic 12/22/2018  . Need for zoster vaccine 09/22/2018  . Hemorrhoid 08/11/2017  . Sigmoid diverticulosis 08/11/2017  . Essential hypertension 02/28/2015  . Joint pain 02/28/2015  . Easy bruising 02/19/2014  . Menometrorrhagia 07/14/2013  . Polymenorrhea 07/11/2013  . GAD (generalized anxiety disorder) 01/04/2013  . CMC arthritis, thumb, degenerative 04/02/2011  . 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/02/2011  . Obesity (BMI 30.0-34.9) 10/22/2009  . Depression with anxiety 10/22/2009  . NICOTINE ADDICTION 10/09/2008  . Migraine 02/24/2007   Clarene Critchley PT, DPT 9:32 AM, 06/29/19 [place], [place], [address] Phone: [phone]   Fax:  [phone]  Name: Zoe Bailey MRN: [id number] Date of Birth: 1965-08-20

## 2019-06-29 NOTE — Telephone Encounter (Signed)
Order in chart

## 2019-06-29 NOTE — Telephone Encounter (Signed)
Crystal from [facility] called.   Following up on her request for OT Orders on the patient.   Call back: [phone]

## 2019-07-04 ENCOUNTER — Ambulatory Visit (HOSPITAL_COMMUNITY): Payer: 59 | Admitting: Physical Therapy

## 2019-07-04 ENCOUNTER — Encounter (HOSPITAL_COMMUNITY): Payer: Self-pay | Admitting: Physical Therapy

## 2019-07-04 ENCOUNTER — Other Ambulatory Visit: Payer: Self-pay

## 2019-07-04 DIAGNOSIS — M6281 Muscle weakness (generalized): Secondary | ICD-10-CM | POA: Diagnosis not present

## 2019-07-04 DIAGNOSIS — M25671 Stiffness of right ankle, not elsewhere classified: Secondary | ICD-10-CM

## 2019-07-04 DIAGNOSIS — M25672 Stiffness of left ankle, not elsewhere classified: Secondary | ICD-10-CM

## 2019-07-04 DIAGNOSIS — R29898 Other symptoms and signs involving the musculoskeletal system: Secondary | ICD-10-CM | POA: Diagnosis not present

## 2019-07-04 DIAGNOSIS — R600 Localized edema: Secondary | ICD-10-CM | POA: Diagnosis not present

## 2019-07-04 DIAGNOSIS — M79642 Pain in left hand: Secondary | ICD-10-CM | POA: Diagnosis not present

## 2019-07-04 DIAGNOSIS — M79641 Pain in right hand: Secondary | ICD-10-CM | POA: Diagnosis not present

## 2019-07-04 NOTE — Therapy (Signed)
[facility] [place], [place], [address] Phone: [phone]   Fax:  [phone]  Physical Therapy Treatment  Patient Details  Name: Zoe Bailey MRN: [id number] Date of Birth: 1965/07/10 Referring Provider (PT): Persons, Bevely Palmer, [place]   Encounter Date: 07/04/2019  PT End of Session - 07/04/19 0821    Visit Number  11    Number of Visits  16    Date for PT Re-Evaluation  07/20/19    Authorization Type  [place] [employer] Jose[employer] Kauf[employer] required after 25th visit)    Authorization - Visit Number  11    Authorization - Number of Visits  25    Progress Note Due on Visit  20    PT Start Time  0816    PT Stop Time  0910    PT Time Calculation (min)  54 min    Activity Tolerance  Patient tolerated treatment well;No increased pain    Behavior During Therapy  WFL for tasks assessed/performed       Past Medical History:  Diagnosis Date  . Arthritis   . Cancer ([address]) 2010 approx   skin cancer  . Complication of anesthesia    Wakes up during all surgeries  . COPD (chronic obstructive pulmonary disease) ([place])   . Depression   . Foot pain, right 03/2014  . Migraine    USES BOTOX FOR TREATMENT   . Shortness of breath dyspnea   . Sleep apnea   . Toenail fungus     Past Surgical History:  Procedure Laterality Date  . ANKLE FRACTURE SURGERY Right   . BREAST ENHANCEMENT SURGERY    . CARPAL TUNNEL RELEASE Right    x2  . CHONDROPLASTY Right 02/17/2018   Procedure: CHONDROPLASTY;  Surgeon: Paralee Cancel, MD;  [facility];  Service: Orthopedics;  Laterality: Right;  . COLONOSCOPY WITH PROPOFOL N/A 05/07/2017   Procedure: COLONOSCOPY WITH PROPOFOL;  Surgeon: Rogene Houston, MD;  [facility];  Service: Endoscopy;  Laterality: N/A;  8:30  . ESOPHAGOGASTRODUODENOSCOPY (EGD) WITH PROPOFOL N/A 03/06/2019   Procedure: ESOPHAGOGASTRODUODENOSCOPY (EGD) WITH PROPOFOL;  Surgeon: Rogene Houston, MD;  [facility];  Service:  Endoscopy;  Laterality: N/A;  . HERNIA REPAIR     umbilical  . KNEE ARTHROSCOPY WITH MEDIAL MENISECTOMY Right 02/17/2018   Procedure: KNEE ARTHROSCOPY WITH MEDIAL MENISECTOMY;  Surgeon: Paralee Cancel, MD;  [facility];  Service: Orthopedics;  Laterality: Right;  . MALONEY DILATION  03/06/2019   Procedure: MALONEY DILATION;  Surgeon: Rogene Houston, MD;  [facility];  Service: Endoscopy;;  . NASAL POLYP SURGERY    . POLYPECTOMY      There were no vitals filed for this visit.  Subjective Assessment - 07/04/19 0820    Subjective  Patient reported 3/10 pain in the anterior aspect of her LT ankle.    Pertinent History  History of broken left foot 4 years ago per patient report    Limitations  House hold activities;Standing;Walking    How long can you sit comfortably?  Not limited    How long can you stand comfortably?  2.5-3 hours    How long can you walk comfortably?  10-20 minutes of walking    Diagnostic tests  MRI: see chart review    Patient Stated Goals  To not have to have surgery    Currently in Pain?  Yes    Pain Score  3  Pain Location  Ankle    Pain Orientation  Left    Pain Descriptors / Indicators  Aching    Pain Type  Chronic pain    Pain Onset  More than a month ago    Pain Frequency  Intermittent         [facility] PT Assessment - 07/04/19 0001      Assessment   Medical Diagnosis  Peroneal tendonitis    Referring Provider (PT)  Persons, Bevely Palmer, [place]      Observation/Other Assessments   Focus on Therapeutic Outcomes (FOTO)   60%   was 47%     Figure 8 Edema   Figure 8 - Right   50 cm    Figure 8 - Left   50 cm   was 52 cm                  [facility] Adult PT Treatment/Exercise - 07/04/19 0001      Manual Therapy   Manual Therapy  Joint mobilization    Manual therapy comments  completed seperate from rest of treatment interventions    Joint Mobilization  PT supine AP Talocrural joint to increase ankle mobility grade III. General  TC distraction to improve mobility grade II.      Ankle Exercises: Seated   Towel Inversion/Eversion  --   10x each direction   Other Seated Ankle Exercises  Arch formation x10 each LE      Ankle Exercises: Standing   Other Standing Ankle Exercises  Heel raise with tennis ball at midfoot x15 2'' raise and lower    Other Standing Ankle Exercises  SLS on foam intermittent UE assist for balance               PT Short Term Goals - 06/29/19 [id number]      PT SHORT TERM GOAL #1   Title  Patient will demonstrate understanding and report regular compliance with HEP to improve mobility.    Time  3    Period  Weeks    Status  Achieved    Target Date  06/16/19      PT SHORT TERM GOAL #2   Title  Patient will report an improvement of at least 50% in overall symptoms indicating an improvement in QOL.    Time  3    Period  Weeks    Status  Achieved    Target Date  06/16/19        PT Long Term Goals - 07/04/19 0941      PT LONG TERM GOAL #1   Title  Patient will demonstrate improvement in left ankle AROM of at least 5 degrees in all deficient planes of motion to improve gait mechanics and decrease pain.    Time  6    Period  Weeks    Status  Achieved      PT LONG TERM GOAL #2   Title  Patient will demonstrate ability to maintain single limb stance for at least 5 seconds on each LE in order to improve safety with stair negotiation.    Time  6    Period  Weeks    Status  Achieved      PT LONG TERM GOAL #3   Title  Patient will report that her pain has not exceeded a 3/10 maximum over the course of a 1 week period indicating an improvement in tolerance to daily activities.    Time  6    Period  Weeks    Status  Achieved      PT LONG TERM GOAL #4   Title  Patient will report ability to ambulate for at least 30 minutes in order to perform shopping activities with improved ease.    Time  6    Period  Weeks    Status  On-going      PT LONG TERM GOAL #5   Title  Patient will  report an improvement of at least 75% in overall symptoms indicating an improvement in QOL.    Time  6    Period  Weeks    Status  Achieved      PT LONG TERM GOAL #6   Title  Patient will report that her pain has not exceeded a 1/10 maximum over the course of a 1 week period indicating an improvement in tolerance to daily activities.    Time  3    Period  Weeks    Status  On-going      PT LONG TERM GOAL #7   Title  Patient will demonstrate ability to perform at least 1 single leg heel raise on the LT LE indicating improved plantarflexion strength to return to recreational walking for exercise.    Time  3    Period  Weeks    Status  On-going      PT LONG TERM GOAL #8   Title  Patient will demonstrate ability to perform single limb stance for at least 15 seconds on the LT LE indicating improved muscular stability and balance.    Time  3    Period  Weeks    Status  On-going            Plan - 07/04/19 0940    Clinical Impression Statement  Patient reporting pain in the anterior ankle this session. She reported that she has not worn her brace for the last week and feels she may have overdone it some. Performed FOTO this session with noted improvement in score since the original evaluation as well as an improvement in edema by evidence of the figure 8 measurement. Introduced seated arch formation exercise this session. Ended with manual focusing on improving joint mobility to improve ankle mobility and decrease anterior ankle pain. Patient reported a reduction in her pain at the end of the session.    Personal Factors and Comorbidities  Age;Time since onset of injury/illness/exacerbation;Comorbidity 3+    Comorbidities  HTN, GAD, Depression    Examination-Activity Limitations  Stand;Locomotion Level;Stairs;Squat    Examination-Participation Restrictions  Yard Work;Cleaning;Community Activity;Meal Prep;Laundry    Stability/Clinical Decision Making  Evolving/Moderate complexity    Rehab  Potential  Good    PT Frequency  2x / week    PT Duration  3 weeks    PT Treatment/Interventions  ADLs/Self Care Home Management;Aquatic Therapy;Cryotherapy;Electrical Stimulation;Moist Heat;DME Instruction;Gait training;Stair training;Functional mobility training;Therapeutic activities;Therapeutic exercise;Balance training;Neuromuscular re-education;Patient/family education;Orthotic Fit/Training;Manual techniques;Passive range of motion;Dry needling;Energy conservation;Taping    PT Next Visit Plan  Work on single leg balance, slow progression of plantarflexion strengthening and dorsiflexion mobility. See MRI results for more detail regarding injury. Avoid excessive end range DF stretching or inversion of ankle due to results of MRI showing tendon tears.    PT Home Exercise Plan  05/26/19: Elevate and ice; 3/25: arch doming, ankle circles CW/CCW; 06/13/19: Ankle isometrics 4 directions 10x daily; 06/22/19: RTB partial range ankle iversion and eversion x10    Consulted and Agree with Plan of Care  Patient  Patient will benefit from skilled therapeutic intervention in order to improve the following deficits and impairments:  Abnormal gait, Pain, Decreased mobility, Decreased activity tolerance, Decreased endurance, Decreased range of motion, Decreased strength, Hypomobility, Decreased balance, Difficulty walking, Increased edema  Visit Diagnosis: Stiffness of left ankle, not elsewhere classified  Muscle weakness (generalized)  Stiffness of right ankle, not elsewhere classified     Problem List Patient Active Problem List   Diagnosis Date Noted  . Other chronic pain 06/21/2019  . Falls frequently 06/21/2019  . Cyst of knee joint 06/21/2019  . Oral phase dysphagia 01/24/2019  . GERD (gastroesophageal reflux disease) 01/04/2019  . Swollen tongue 12/29/2018  . Dry mouth 12/22/2018  . Dysphagia 12/22/2018  . Hoarseness, chronic 12/22/2018  . Need for zoster vaccine 09/22/2018  .  Hemorrhoid 08/11/2017  . Sigmoid diverticulosis 08/11/2017  . Essential hypertension 02/28/2015  . Joint pain 02/28/2015  . Easy bruising 02/19/2014  . Menometrorrhagia 07/14/2013  . Polymenorrhea 07/11/2013  . GAD (generalized anxiety disorder) 01/04/2013  . CMC arthritis, thumb, degenerative 04/02/2011  . 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/02/2011  . Obesity (BMI 30.0-34.9) 10/22/2009  . Depression with anxiety 10/22/2009  . NICOTINE ADDICTION 10/09/2008  . Migraine 02/24/2007   Clarene Critchley PT, DPT 9:43 AM, 07/04/19 [place], [place], [address] Phone: [phone]   Fax:  [phone]  Name: Zoe Bailey MRN: [id number] Date of Birth: April 22, 1965

## 2019-07-05 ENCOUNTER — Encounter: Payer: Self-pay | Admitting: Neurology

## 2019-07-05 ENCOUNTER — Ambulatory Visit: Payer: 59 | Admitting: Orthopedic Surgery

## 2019-07-05 ENCOUNTER — Ambulatory Visit: Payer: 59 | Admitting: Neurology

## 2019-07-05 VITALS — BP 148/91 | HR 81 | Ht 62.0 in | Wt 174.0 lb

## 2019-07-05 DIAGNOSIS — M2141 Flat foot [pes planus] (acquired), right foot: Secondary | ICD-10-CM | POA: Diagnosis not present

## 2019-07-05 DIAGNOSIS — M2142 Flat foot [pes planus] (acquired), left foot: Secondary | ICD-10-CM

## 2019-07-05 NOTE — Progress Notes (Signed)
SLEEP MEDICINE CLINIC    Provider:  Larey Seat, MD  Primary Care Physician:  Fayrene Helper, MD [address] [place] [address]     Referring Provider: Cherly Beach, NP         Chief Complaint according to patient   Patient presents with:    . New Patient (Initial Visit)     pt states that within the past year, she has had increase in falls but no explanation why the fall happened. states that she has no concerns with dizziness. she has multiple injuries and surgeries from the falls. she states her mom had this issues also. last MRI of brain was 10 yrs or so ago.      HISTORY OF PRESENT ILLNESS:  Zoe Bailey is a 54 year- old Caucasian female [employer] employee and seen on 07/05/2019.  Chief concern according to patient : Recent falls-     GIUSEPPINA Bailey is  a right -handed White or Caucasian female with recent unexplained falls.  She  has a past medical history of Arthritis, Cancer ([place]) ([id number] approx), Complication of anesthesia, COPD (chronic obstructive pulmonary disease) ([place]), Depression, Foot pain, right (03/2014), Migraine, Shortness of breath dyspnea, Sleep apnea.  The patient broke 2 ankles, had 2 knee injuries, bilaterally, carpal tunnel , and no vertigo. She has had all these orthopedic problems preceded the falls , which started a year ago.   She feels her ankles turn, and she loses balance, and that leads to falls.  She drops objects form her hands, because her grip is weak.      Family medical Hx; vestibular vertigo in her mother - she fell a lot in regards to vertigo.  Social history:  Patient is working in a rehab center and lives in a household with alone. One adult daughter. The patient currently works in day shifts.  Pets are present, 2 dogs. . Tobacco use- smoker- 34 years .  ETOH use ; rare , Caffeine intake in form of Coffee( 2 in AM ) Soda( none ) Tea ( none) - no energy drinks. Regular exercise in form of GYM and PT.      Review  of Systems: Out of a complete 14 system review, the patient complains of only the following symptoms, and all other reviewed systems are negative.:    Flat feet, difficulties with toe stand.   Social History   Socioeconomic History  . Marital status: Divorced    Spouse name: Not on file  . Number of children: 1  . Years of education: college  . Highest education level: Not on file  Occupational History  . Occupation: Electrical engineer: [employer]  Tobacco Use  . Smoking status: Current Every Day Smoker    Packs/day: 0.25    Years: 33.00    Pack years: 8.25  . Smokeless tobacco: Never Used  Substance and Sexual Activity  . Alcohol use: No    Alcohol/week: 0.0 standard drinks  . Drug use: No  . Sexual activity: Yes    Birth control/protection: None  Other Topics Concern  . Not on file  Social History Narrative   Divorced. Lives alone. Has 2 dogs   Social Determinants of Health   Financial Resource Strain:   . Difficulty of Paying Living Expenses:   Food Insecurity:   . Worried About Charity fundraiser in the Last Year:   . Ran  Out of Food in the Last Year:   Transportation Needs:   . Lack of Transportation (Medical):   Marland Kitchen Lack of Transportation (Non-Medical):   Physical Activity:   . Days of Exercise per Week:   . Minutes of Exercise per Session:   Stress:   . Feeling of Stress :   Social Connections:   . Frequency of Communication with Friends and Family:   . Frequency of Social Gatherings with Friends and Family:   . Attends Religious Services:   . Active Member of Clubs or Organizations:   . Attends Archivist Meetings:   Marland Kitchen Marital Status:     Family History  Problem Relation Age of Onset  . Arthritis Other   . Cancer Other     Past Medical History:  Diagnosis Date  . Arthritis   . Cancer ([place]) 2010 approx   skin cancer  . Complication of anesthesia    Wakes up during all surgeries  . COPD (chronic  obstructive pulmonary disease) ([place])   . Depression   . Foot pain, right 03/2014  . Migraine    USES BOTOX FOR TREATMENT   . Shortness of breath dyspnea   . Sleep apnea   . Toenail fungus     Past Surgical History:  Procedure Laterality Date  . ANKLE FRACTURE SURGERY Right   . BREAST ENHANCEMENT SURGERY    . CARPAL TUNNEL RELEASE Right    x2  . CHONDROPLASTY Right 02/17/2018   Procedure: CHONDROPLASTY;  Surgeon: Paralee Cancel, MD;  [facility];  Service: Orthopedics;  Laterality: Right;  . COLONOSCOPY WITH PROPOFOL N/A 05/07/2017   Procedure: COLONOSCOPY WITH PROPOFOL;  Surgeon: Rogene Houston, MD;  [facility];  Service: Endoscopy;  Laterality: N/A;  8:30  . ESOPHAGOGASTRODUODENOSCOPY (EGD) WITH PROPOFOL N/A 03/06/2019   Procedure: ESOPHAGOGASTRODUODENOSCOPY (EGD) WITH PROPOFOL;  Surgeon: Rogene Houston, MD;  [facility];  Service: Endoscopy;  Laterality: N/A;  . HERNIA REPAIR     umbilical  . KNEE ARTHROSCOPY WITH MEDIAL MENISECTOMY Right 02/17/2018   Procedure: KNEE ARTHROSCOPY WITH MEDIAL MENISECTOMY;  Surgeon: Paralee Cancel, MD;  [facility];  Service: Orthopedics;  Laterality: Right;  . MALONEY DILATION  03/06/2019   Procedure: MALONEY DILATION;  Surgeon: Rogene Houston, MD;  [facility];  Service: Endoscopy;;  . NASAL POLYP SURGERY    . POLYPECTOMY       Current Outpatient Medications on File Prior to Visit  Medication Sig Dispense Refill  . buPROPion (WELLBUTRIN XL) 300 MG 24 hr tablet TAKE 1 TABLET BY MOUTH DAILY. 90 tablet 1  . busPIRone (BUSPAR) 10 MG tablet TAKE 1 TABLET BY MOUTH 3 TIMES DAILY. 90 tablet 3  . cyclobenzaprine (FLEXERIL) 10 MG tablet TAKE 1 TABLET BY MOUTH 3 TIMES DAILY AS NEEDED FOR MUSCLE SPASMS. 30 tablet 2  . diclofenac (VOLTAREN) 75 MG EC tablet TAKE 1 TABLET BY MOUTH ONCE DAILY AS NEEDED FOR PAIN 30 tablet 2  . esomeprazole (NEXIUM) 40 MG capsule Take 1 capsule (40 mg total) by mouth daily before  breakfast. 30 capsule 5  . gabapentin (NEURONTIN) 400 MG capsule Take two capsules by mouth every morning for migraine prevention (Patient taking differently: Take 800 mg by mouth every morning. Take two capsules by mouth every morning for migraine prevention) 180 capsule 3  . Multiple Vitamin (MULTIVITAMIN WITH MINERALS) TABS tablet Take 2 tablets by mouth daily.     . OnabotulinumtoxinA (BOTOX IM) Inject 1  Dose into the muscle every 3 (three) months.    . phentermine (ADIPEX-P) 37.5 MG tablet Take 1 tablet (37.5 mg total) by mouth daily before breakfast. 30 tablet 3  . tiotropium (SPIRIVA HANDIHALER) 18 MCG inhalation capsule Place 1 capsule (18 mcg total) into inhaler and inhale daily for 30 days. (Patient taking differently: Place 18 mcg into inhaler and inhale daily as needed (shortness of breath). ) 30 capsule 3  . triamterene-hydrochlorothiazide (MAXZIDE) 75-50 MG tablet Take 1 tablet by mouth daily. 90 tablet 3  . UNABLE TO FIND Blood pressure monitor x 1  DX I10 1 each 0   No current facility-administered medications on file prior to visit.    Allergies  Allergen Reactions  . Effexor [Venlafaxine] Other (See Comments)    Decreased libido  . Other     Pt has a fear of tourniquets    Physical exam:  Today's Vitals   07/05/19 0830  BP: (!) 148/91  Pulse: 81  Weight: 174 lb (78.9 kg)  Height: 5\' 2"  (1.575 m)   Body mass index is 31.83 kg/m.   Wt Readings from Last 3 Encounters:  07/05/19 174 lb (78.9 kg)  06/20/19 176 lb (79.8 kg)  04/10/19 168 lb (76.2 kg)     Ht Readings from Last 3 Encounters:  07/05/19 5\' 2"  (1.575 m)  06/20/19 5\' 2"  (1.575 m)  04/10/19 5\' 2"  (1.575 m)      General: The patient is awake, alert and appears not in acute distress. The patient is well groomed. Head: Normocephalic, atraumatic. Neck is supple. Cardiovascular:  Regular rate and cardiac rhythm by pulse,  without distended neck veins. Respiratory: Lungs are clear to auscultation.    Skin:  Without evidence of ankle edema, or rash. Trunk: The patient's posture is erect.   Neurologic exam : The patient is awake and alert, oriented to place and time.   Memory subjective described as intact.  Attention span & concentration ability appears normal.  Speech is fluent,  without  dysarthria, dysphonia or aphasia.  Mood and affect are appropriate.   Cranial nerves: no loss of smell or taste reported  Pupils are equal and briskly reactive to light. Funduscopic exam deferred.   Extraocular movements in vertical and horizontal planes were intact and without nystagmus. No Diplopia.  Facial motor strength is symmetric and tongue and uvula move midline.  Neck ROM : rotation, tilt and flexion extension were normal for age and shoulder shrug was symmetrical.    Motor exam:  Symmetric bulk, tone and ROM.   Normal tone without cog wheeling, symmetric grip strength . She has no arch - her feet are flat and toe stand is very weak.    Sensory:  Fine touch, pinprick and vibration were normal.  Proprioception tested in the upper extremities was normal.   Coordination: Rapid alternating movements in the fingers/hands were of normal speed.  The Finger-to-nose maneuver was intact without evidence of ataxia, dysmetria or tremor.   Gait and station: Observed barefoot -Patient could rise unassisted from a seated position, walked without assistive device.  Stance is of normal width/ base and the patient turned with 3 steps.  Toe and heel stand were observed.   Deep tendon reflexes: in the  upper and lower extremities are symmetric and intact.      After spending a total time of  30 minutes face to face and additional time for physical and neurologic examination, review of laboratory studies,  personal review of imaging studies,  reports and results of other testing and review of referral information / records as far as provided in visit, I have established the following assessments:  1)   Normal neurologic examination without evidence of vertigo, spinal stenosis, focal weakness or neuropathy.     My Plan is to proceed with: reduce flexaril- only take it at night. Change diet to high protein to maintain muscle. Go to PT and OT.   1) please defer to orthopedic care - possible podiatrist.   I would like to thank Fayrene Helper, MD and Fayrene Helper, [place], [place],  [place] [address] for allowing me to meet with and to take care of this pleasant patient.   In short, Zoe Bailey is presenting with falls due to weakness at her ankles. Electronically signed by: Larey Seat, MD 07/05/2019 8:36 AM  [facility] and [facility] Sleep Board certified by The [employer] of Sleep Medicine and Diplomate of the [employer] of Sleep Medicine. Board certified In Neurology through the [place], Fellow of the [employer] of Neurology. Medical Director of [employer].

## 2019-07-06 ENCOUNTER — Ambulatory Visit (HOSPITAL_COMMUNITY): Payer: 59

## 2019-07-06 ENCOUNTER — Encounter (HOSPITAL_COMMUNITY): Payer: Self-pay

## 2019-07-06 ENCOUNTER — Other Ambulatory Visit: Payer: Self-pay

## 2019-07-06 ENCOUNTER — Encounter (HOSPITAL_COMMUNITY): Payer: Self-pay | Admitting: Physical Therapy

## 2019-07-06 ENCOUNTER — Ambulatory Visit (HOSPITAL_COMMUNITY): Payer: 59 | Admitting: Physical Therapy

## 2019-07-06 DIAGNOSIS — M79642 Pain in left hand: Secondary | ICD-10-CM

## 2019-07-06 DIAGNOSIS — R29898 Other symptoms and signs involving the musculoskeletal system: Secondary | ICD-10-CM

## 2019-07-06 DIAGNOSIS — R600 Localized edema: Secondary | ICD-10-CM

## 2019-07-06 DIAGNOSIS — M79641 Pain in right hand: Secondary | ICD-10-CM | POA: Diagnosis not present

## 2019-07-06 DIAGNOSIS — M6281 Muscle weakness (generalized): Secondary | ICD-10-CM | POA: Diagnosis not present

## 2019-07-06 DIAGNOSIS — M25672 Stiffness of left ankle, not elsewhere classified: Secondary | ICD-10-CM | POA: Diagnosis not present

## 2019-07-06 DIAGNOSIS — M25671 Stiffness of right ankle, not elsewhere classified: Secondary | ICD-10-CM | POA: Diagnosis not present

## 2019-07-06 MED FILL — CYCLOBENZAPRINE HCL 10 MG T: 10 | 10 days supply | Qty: 30 | Fill #2

## 2019-07-06 NOTE — Patient Instructions (Addendum)
Complete as needed throughout the day.  1-2 times a day.  Wrist Flexion and Extension PROM  Keeping your elbow straight, use your unaffected hand to bend the affected wrist downward as shown. Hold this stretch for 30 seconds.  Still keeping your elbow straight, use your unaffected hand to band the wrist upward as shown. Hold this stretch for 30 seconds.     WRIST EXTENSION ISOMETRIC  Bend your wrist back and resist into your other hand. Hold for 10-15 seconds. Complete 2 times.       Wrist Tendon Glide  1. Stand/sit with your affected extremity in front of you, with your wrist in neutral and fingers and thumb extended. Hold for 2 seconds.  2. Keeping wrist in neutral, fully bend your first two knuckles, making a "hook fist". Hold for 2 seconds.  3. Keeping wrist in neutral, bend all finger knuckles into a full fist. Hold for 2 seconds. 4. Keeping wrist in neutral, extend the last digit of your fingers, as if you're holding a delicate caterpillar in your hand and don't want to crush it. Hold for 2 seconds.     Median Nerve Glide  While standing and facing forward, position your arm as if you are holding a tray with your elbow bent and your palm facing the ceiling. Slowly extend the arm until the elbow is straight while bringing your ear to the same shoulder. Be sure to maintain the same hand position (wrist extended) during this whole motion. Return to start. DO NOT OVERSTRETCH THE NERVE!   Wrist 4 way Complete 10-12 repetitions. Complete with 1-2 lbs.   Observe photos from left to right and top to bottom as if you are reading:  1) The first photo on the top left shows wrist flexion: Hold weight with palm face up pulling knuckles towards the ceiling as you raise your hand up and down with full range of motion of the wrist.  2) The second photo on the top right shows wrist extension: Hold a weight with palm face down pulling knuckles towards the ceiling as you raise your hand up  and down with full range of motion of the wrist.  3) The third photo on the bottom left shows radial deviation of the wrist: Hold a weight with your hand turned so that your thumb is on top. Next raise your hand up and down with full range of motion of the wrist.  4) The fourth photo on the bottom right shows supination and pronation of the wrist: Hold weight at the end of the weight so that most of the weight is above your hand. Next, rotate the weight in a clockwise/ counterclockwise motion while keeping your forearm resting on the table. Move wrist through your full range of motion.

## 2019-07-06 NOTE — Therapy (Signed)
[place], [place], [address] Phone: [phone]   Fax:  [phone]  Physical Therapy Treatment  Patient Details  Name: Zoe Bailey MRN: [id number] Date of Birth: 09-12-1965 Referring Provider (PT): Persons, Bevely [place], [place]   Encounter Date: 07/06/2019  PT End of Session - 07/06/19 0913    Visit Number  12    Number of Visits  16    Date for PT Re-Evaluation  07/20/19    Authorization Type  [place] [employer] Josem Kaufmann required after 25th visit)    Authorization - Visit Number  12    Authorization - Number of Visits  25    Progress Note Due on Visit  20    PT Start Time  0820    PT Stop Time  0900    PT Time Calculation (min)  40 min    Activity Tolerance  Patient tolerated treatment well;No increased pain    Behavior During Therapy  WFL for tasks assessed/performed       Past Medical History:  Diagnosis Date  . Arthritis   . Cancer ([place]) 2010 approx   skin cancer  . Complication of anesthesia    Wakes up during all surgeries  . COPD (chronic obstructive pulmonary disease) ([place])   . Depression   . Foot pain, right 03/2014  . Migraine    USES BOTOX FOR TREATMENT   . Shortness of breath dyspnea   . Sleep apnea   . Toenail fungus     Past Surgical History:  Procedure Laterality Date  . ANKLE FRACTURE SURGERY Right   . BREAST ENHANCEMENT SURGERY    . CARPAL TUNNEL RELEASE Right    x2  . CHONDROPLASTY Right 02/17/2018   Procedure: CHONDROPLASTY;  Surgeon: Paralee Cancel, MD;  [facility];  Service: Orthopedics;  Laterality: Right;  . COLONOSCOPY WITH PROPOFOL N/A 05/07/2017   Procedure: COLONOSCOPY WITH PROPOFOL;  Surgeon: Rogene Houston, MD;  [facility];  Service: Endoscopy;  Laterality: N/A;  8:30  . ESOPHAGOGASTRODUODENOSCOPY (EGD) WITH PROPOFOL N/A 03/06/2019   Procedure: ESOPHAGOGASTRODUODENOSCOPY (EGD) WITH PROPOFOL;  Surgeon: Rogene Houston, MD;  [facility];  Service:  Endoscopy;  Laterality: N/A;  . HERNIA REPAIR     umbilical  . KNEE ARTHROSCOPY WITH MEDIAL MENISECTOMY Right 02/17/2018   Procedure: KNEE ARTHROSCOPY WITH MEDIAL MENISECTOMY;  Surgeon: Paralee Cancel, MD;  [facility];  Service: Orthopedics;  Laterality: Right;  . MALONEY DILATION  03/06/2019   Procedure: MALONEY DILATION;  Surgeon: Rogene Houston, MD;  [facility];  Service: Endoscopy;;  . NASAL POLYP SURGERY    . POLYPECTOMY      There were no vitals filed for this visit.  Subjective Assessment - 07/06/19 0823    Subjective  Patient reported that her pain level is a 4/10 in her left ankle on the outside which she stated began when she was leaving work yesterday.    Pertinent History  History of broken left foot 4 years ago per patient report    Limitations  House hold activities;Standing;Walking    How long can you sit comfortably?  Not limited    How long can you stand comfortably?  2.5-3 hours    How long can you walk comfortably?  10-20 minutes of walking    Diagnostic tests  MRI: see chart review    Patient Stated Goals  To not have to have surgery    Currently  in Pain?  Yes    Pain Score  4     Pain Location  Ankle    Pain Orientation  Left    Pain Descriptors / Indicators  Aching    Pain Onset  More than a month ago                       [facility] Adult PT Treatment/Exercise - 07/06/19 0001      Manual Therapy   Manual Therapy  Joint mobilization;Soft tissue mobilization    Manual therapy comments  completed seperate from rest of treatment interventions    Joint Mobilization  PT supine general TC distraction to improve mobility grade II.    Soft tissue mobilization  pt supine, BLE elevated on wedge, STM to plantar fascitis, gastroc/soleus complex to decrease pain and improve flexiblity      Ankle Exercises: Seated   Towel Inversion/Eversion  --   10x each direction eversion and inversion   Other Seated Ankle Exercises  Arch formation x10  each LE      Ankle Exercises: Standing   Other Standing Ankle Exercises  Heel raise with tennis ball at midfoot x15 2'' raise and lower               PT Short Term Goals - 06/29/19 [id number]      PT SHORT TERM GOAL #1   Title  Patient will demonstrate understanding and report regular compliance with HEP to improve mobility.    Time  3    Period  Weeks    Status  Achieved    Target Date  06/16/19      PT SHORT TERM GOAL #2   Title  Patient will report an improvement of at least 50% in overall symptoms indicating an improvement in QOL.    Time  3    Period  Weeks    Status  Achieved    Target Date  06/16/19        PT Long Term Goals - 07/04/19 0941      PT LONG TERM GOAL #1   Title  Patient will demonstrate improvement in left ankle AROM of at least 5 degrees in all deficient planes of motion to improve gait mechanics and decrease pain.    Time  6    Period  Weeks    Status  Achieved      PT LONG TERM GOAL #2   Title  Patient will demonstrate ability to maintain single limb stance for at least 5 seconds on each LE in order to improve safety with stair negotiation.    Time  6    Period  Weeks    Status  Achieved      PT LONG TERM GOAL #3   Title  Patient will report that her pain has not exceeded a 3/10 maximum over the course of a 1 week period indicating an improvement in tolerance to daily activities.    Time  6    Period  Weeks    Status  Achieved      PT LONG TERM GOAL #4   Title  Patient will report ability to ambulate for at least 30 minutes in order to perform shopping activities with improved ease.    Time  6    Period  Weeks    Status  On-going      PT LONG TERM GOAL #5   Title  Patient will report an improvement of at least  75% in overall symptoms indicating an improvement in QOL.    Time  6    Period  Weeks    Status  Achieved      PT LONG TERM GOAL #6   Title  Patient will report that her pain has not exceeded a 1/10 maximum over the course of a  1 week period indicating an improvement in tolerance to daily activities.    Time  3    Period  Weeks    Status  On-going      PT LONG TERM GOAL #7   Title  Patient will demonstrate ability to perform at least 1 single leg heel raise on the LT LE indicating improved plantarflexion strength to return to recreational walking for exercise.    Time  3    Period  Weeks    Status  On-going      PT LONG TERM GOAL #8   Title  Patient will demonstrate ability to perform single limb stance for at least 15 seconds on the LT LE indicating improved muscular stability and balance.    Time  3    Period  Weeks    Status  On-going            Plan - 07/06/19 0914    Clinical Impression Statement  Patient presents with increased pain this session which she reported began yesterday. Began the session with manual therapy to decrease the patient's pain and improve mobility. Patient reported vast improvement in pain following this. Continued with mobility and strengthening exercises this session. Patient did have more difficulty with heel raises this session due to pain. Educated patient to perform HEP to tolerance at this time.    Personal Factors and Comorbidities  Age;Time since onset of injury/illness/exacerbation;Comorbidity 3+    Comorbidities  HTN, GAD, Depression    Examination-Activity Limitations  Stand;Locomotion Level;Stairs;Squat    Examination-Participation Restrictions  Yard Work;Cleaning;Community Activity;Meal Prep;Laundry    Stability/Clinical Decision Making  Evolving/Moderate complexity    Rehab Potential  Good    PT Frequency  2x / week    PT Duration  3 weeks    PT Treatment/Interventions  ADLs/Self Care Home Management;Aquatic Therapy;Cryotherapy;Electrical Stimulation;Moist Heat;DME Instruction;Gait training;Stair training;Functional mobility training;Therapeutic activities;Therapeutic exercise;Balance training;Neuromuscular re-education;Patient/family education;Orthotic  Fit/Training;Manual techniques;Passive range of motion;Dry needling;Energy conservation;Taping    PT Next Visit Plan  Work on single leg balance, slow progression of plantarflexion strengthening and dorsiflexion mobility. See MRI results for more detail regarding injury. Avoid excessive end range DF stretching or inversion of ankle due to results of MRI showing tendon tears.    PT Home Exercise Plan  05/26/19: Elevate and ice; 3/25: arch doming, ankle circles CW/CCW; 06/13/19: Ankle isometrics 4 directions 10x daily; 06/22/19: RTB partial range ankle iversion and eversion x10    Consulted and Agree with Plan of Care  Patient       Patient will benefit from skilled therapeutic intervention in order to improve the following deficits and impairments:  Abnormal gait, Pain, Decreased mobility, Decreased activity tolerance, Decreased endurance, Decreased range of motion, Decreased strength, Hypomobility, Decreased balance, Difficulty walking, Increased edema  Visit Diagnosis: Stiffness of left ankle, not elsewhere classified  Muscle weakness (generalized)  Stiffness of right ankle, not elsewhere classified  Localized edema     Problem List Patient Active Problem List   Diagnosis Date Noted  . Flat feet, bilateral 07/05/2019  . Other chronic pain 06/21/2019  . Falls frequently 06/21/2019  . Cyst of knee joint 06/21/2019  .  Oral phase dysphagia 01/24/2019  . GERD (gastroesophageal reflux disease) 01/04/2019  . Swollen tongue 12/29/2018  . Dry mouth 12/22/2018  . Dysphagia 12/22/2018  . Hoarseness, chronic 12/22/2018  . Need for zoster vaccine 09/22/2018  . Hemorrhoid 08/11/2017  . Sigmoid diverticulosis 08/11/2017  . Essential hypertension 02/28/2015  . Joint pain 02/28/2015  . Easy bruising 02/19/2014  . Menometrorrhagia 07/14/2013  . Polymenorrhea 07/11/2013  . GAD (generalized anxiety disorder) 01/04/2013  . CMC arthritis, thumb, degenerative 04/02/2011  . TFCC (triangular  fibrocartilage complex) injury 04/02/2011  . Obesity (BMI 30.0-34.9) 10/22/2009  . Depression with anxiety 10/22/2009  . NICOTINE ADDICTION 10/09/2008  . Migraine 02/24/2007   Clarene Critchley PT, DPT 9:16 AM, 07/06/19 [place], [place], [address] Phone: [phone]   Fax:  [phone]  Name: Zoe Bailey MRN: [id number] Date of Birth: September 28, 1965

## 2019-07-06 NOTE — Therapy (Signed)
[facility] [address] [place], [place], [address] Phone: [phone]   Fax:  [phone]  Occupational Therapy Evaluation  Patient Details  Name: Zoe Bailey MRN: [id number] Date of Birth: 1965-07-12 Referring Provider (OT): Bevely Palmer Persons, [place]   Encounter Date: 07/06/2019  OT End of Session - 07/06/19 1031    Visit Number  1    Number of Visits  1    Authorization Type  [facility]    Authorization Time Period  25 visits limit. 3 used. $40 copay. requires authorization after visit 25    Authorization - Visit Number  4    Authorization - Number of Visits  25    OT Start Time  [phone]    OT Stop Time  0935    OT Time Calculation (min)  32 min    Activity Tolerance  Patient tolerated treatment well    Behavior During Therapy  [facility] for tasks assessed/performed       Past Medical History:  Diagnosis Date  . Arthritis   . Cancer ([place]) 2010 approx   skin cancer  . Complication of anesthesia    Wakes up during all surgeries  . COPD (chronic obstructive pulmonary disease) ([place])   . Depression   . Foot pain, right 03/2014  . Migraine    USES BOTOX FOR TREATMENT   . Shortness of breath dyspnea   . Sleep apnea   . Toenail fungus     Past Surgical History:  Procedure Laterality Date  . ANKLE FRACTURE SURGERY Right   . BREAST ENHANCEMENT SURGERY    . CARPAL TUNNEL RELEASE Right    x2  . CHONDROPLASTY Right 02/17/2018   Procedure: CHONDROPLASTY;  Surgeon: Paralee Cancel, MD;  [facility]: [facility];  Service: Orthopedics;  Laterality: Right;  . COLONOSCOPY WITH PROPOFOL N/A 05/07/2017   Procedure: COLONOSCOPY WITH PROPOFOL;  Surgeon: Rogene Houston, MD;  [facility];  Service: Endoscopy;  Laterality: N/A;  8:30  . ESOPHAGOGASTRODUODENOSCOPY (EGD) WITH PROPOFOL N/A 03/06/2019   Procedure: ESOPHAGOGASTRODUODENOSCOPY (EGD) WITH PROPOFOL;  Surgeon: Rogene Houston, MD;  [facility];  Service: Endoscopy;  Laterality:  N/A;  . HERNIA REPAIR     umbilical  . KNEE ARTHROSCOPY WITH MEDIAL MENISECTOMY Right 02/17/2018   Procedure: KNEE ARTHROSCOPY WITH MEDIAL MENISECTOMY;  Surgeon: Paralee Cancel, MD;  [facility];  Service: Orthopedics;  Laterality: Right;  . MALONEY DILATION  03/06/2019   Procedure: MALONEY DILATION;  Surgeon: Rogene Houston, MD;  [facility];  Service: Endoscopy;;  . NASAL POLYP SURGERY    . POLYPECTOMY      There were no vitals filed for this visit.  Subjective Assessment - 07/06/19 0944    Subjective   S: I can't have surgery right now because I can't be off of work for 9 months for my right hand. And my left hand I don't want to have surgery and it be out of commission for my right hand.    Pertinent History  Patient is a 54 y/o female with ongoing bilateral hand pain. Left hand with carpal tunnel syndrome and the right wrist has a history of repetitive trauma which in turn is now requiring extensive hand recontruction surgery to fix. Bevely Palmer Persons PA has referred patient to occupational therapy for evaluation and treatment.    Patient Stated Goals  For education on what could help her in the mean time until she can have  surgery.    Currently in Pain?  Yes    Pain Score  --   number not provided   Pain Location  Hand   wrist   Pain Orientation  Left;Right    Pain Descriptors / Indicators  Stabbing;Shooting;Sharp;Aching    Pain Type  Chronic pain    Pain Radiating Towards  N/A    Pain Onset  More than a month ago    Pain Frequency  Constant    Aggravating Factors   gripping activities    Pain Relieving Factors  Nothing has helped. Wears bilateral hand braces and gloves to help provide support and allow her to do her job.    Effect of Pain on Daily Activities  severe effect    Multiple Pain Sites  No        [facility] OT Assessment - 07/06/19 1000      Assessment   Medical Diagnosis  bilateral hand pain    Referring Provider (OT)  Bevely Palmer Persons, PA     Onset Date/Surgical Date  --   several years   Hand Dominance  Left    Prior Therapy  None      Precautions   Precautions  None      Restrictions   Weight Bearing Restrictions  No      Balance Screen   Has the patient fallen in the past 6 months  No      Home  Environment   Family/patient expects to be discharged to:  Private residence      Prior Function   Level of Independence  Independent;Independent with basic ADLs      ADL   ADL comments  Difficulty with gripping and holding onto items without dropping them. Very limited with use of right hand due to extent of wrist issues.      ROM / Strength   AROM / PROM / Strength  AROM      AROM   Overall AROM Comments  Bilateral wrist ROM is functional in all ranges. patient demonstrates bilateral weak gross grasp.                       OT Education - 07/06/19 1030    Education Details  wrist flexion/extension stretch, median nerve glide, wrist tendon glides, wrist strengthening, wrist extension isometric hold    Person(s) Educated  Patient    Methods  Explanation;Demonstration;Handout;Verbal cues;Tactile cues    Comprehension  Returned demonstration;Verbalized understanding       OT Short Term Goals - 07/06/19 1222      OT SHORT TERM GOAL #1   Title  patient will be educated and voice understanding of her HEP to help with pain management and mobility of her bilateral hands/wrist in order to hold off on the required surgery until a later date and allow her to continue working.    Time  1    Period  Days    Status  Achieved    Target Date  07/06/19               Plan - 07/06/19 1038    Clinical Impression Statement  A: Patient is a 54 y/o female S/P bilateral hand pain that has been ongoing for several years due to carpal tunnel and repetitive trauma and general use resulting in discomfort, pain, decreased strength and functional use causing difficulty completing daily and work activities. Due to the  extent of injury to her hands/wrists we discussed  options for management with wrist stretches, gentle strengthening, and tendon glides for a HEP. Pt agreed with recommendation.    OT Occupational Profile and History  Problem Focused Assessment - Including review of records relating to presenting problem    Body Structure / Function / Physical Skills  Pain;Strength    Rehab Potential  Excellent    Clinical Decision Making  Limited treatment options, no task modification necessary    Comorbidities Affecting Occupational Performance:  Presence of comorbidities impacting occupational performance    Comorbidities impacting occupational performance description:  chronic right wrist/hand pain, chronic left CTS    Modification or Assistance to Complete Evaluation   No modification of tasks or assist necessary to complete eval    OT Frequency  One time visit    OT Treatment/Interventions  Patient/family education    Plan  P: 1 time visit for HEP to help with pain management and mobility.    OT Home Exercise Plan  eval: wrist stretches, wrist strengthening, tendon glides    Consulted and Agree with Plan of Care  Patient       Patient will benefit from skilled therapeutic intervention in order to improve the following deficits and impairments:   Body Structure / Function / Physical Skills: Pain, Strength       Visit Diagnosis: Other symptoms and signs involving the musculoskeletal system  Pain in left hand  Pain in right hand    Problem List Patient Active Problem List   Diagnosis Date Noted  . Flat feet, bilateral 07/05/2019  . Other chronic pain 06/21/2019  . Falls frequently 06/21/2019  . Cyst of knee joint 06/21/2019  . Oral phase dysphagia 01/24/2019  . GERD (gastroesophageal reflux disease) 01/04/2019  . Swollen tongue 12/29/2018  . Dry mouth 12/22/2018  . Dysphagia 12/22/2018  . Hoarseness, chronic 12/22/2018  . Need for zoster vaccine 09/22/2018  . Hemorrhoid 08/11/2017  .  Sigmoid diverticulosis 08/11/2017  . Essential hypertension 02/28/2015  . Joint pain 02/28/2015  . Easy bruising 02/19/2014  . Menometrorrhagia 07/14/2013  . Polymenorrhea 07/11/2013  . GAD (generalized anxiety disorder) 01/04/2013  . CMC arthritis, thumb, degenerative 04/02/2011  . 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/02/2011  . Obesity (BMI 30.0-34.9) 10/22/2009  . Depression with anxiety 10/22/2009  . NICOTINE ADDICTION 10/09/2008  . Migraine 02/24/2007   Ailene Ravel, OTR/L,CBIS  [phone]  07/06/2019, 12:24 PM  [facility] [address] [place], [place], [address] Phone: [phone]   Fax:  [phone]  Name: Zoe Bailey MRN: [id number] Date of Birth: Sep 04, 1965

## 2019-07-10 MED FILL — ESOMEPRAZOLE MAG DR 40 MG C: 40 | 30 days supply | Qty: 30 | Fill #4

## 2019-07-11 ENCOUNTER — Ambulatory Visit (HOSPITAL_COMMUNITY): Payer: 59 | Attending: Physician Assistant | Admitting: Physical Therapy

## 2019-07-11 ENCOUNTER — Encounter (HOSPITAL_COMMUNITY): Payer: Self-pay | Admitting: Physical Therapy

## 2019-07-11 ENCOUNTER — Other Ambulatory Visit: Payer: Self-pay

## 2019-07-11 DIAGNOSIS — R600 Localized edema: Secondary | ICD-10-CM | POA: Insufficient documentation

## 2019-07-11 DIAGNOSIS — M25672 Stiffness of left ankle, not elsewhere classified: Secondary | ICD-10-CM | POA: Diagnosis not present

## 2019-07-11 DIAGNOSIS — M6281 Muscle weakness (generalized): Secondary | ICD-10-CM | POA: Diagnosis not present

## 2019-07-11 NOTE — Therapy (Signed)
[facility] [place], [place], [address] Phone: [phone]   Fax:  [phone]  Physical Therapy Treatment  Patient Details  Name: Zoe Bailey MRN: [id number] Date of Birth: [birth year] Referring Provider (PT): Persons, Bevely Palmer, [place]   Encounter Date: 07/11/2019  PT End of Session - 07/11/19 1607    Visit Number  13    Number of Visits  16    Date for PT Re-Evaluation  07/20/19    Authorization Type  [employer] [employer] required after 25th visit)    Authorization - Visit Number  13    Authorization - Number of Visits  25    Progress Note Due on Visit  20    PT Start Time  1601    PT Stop Time  1639    PT Time Calculation (min)  38 min    Activity Tolerance  Patient tolerated treatment well;No increased pain    Behavior During Therapy  WFL for tasks assessed/performed       Past Medical History:  Diagnosis Date  . Arthritis   . Cancer ([place]) 2010 approx   skin cancer  . Complication of anesthesia    Wakes up during all surgeries  . COPD (chronic obstructive pulmonary disease) ([place])   . Depression   . Foot pain, right 03/2014  . Migraine    USES BOTOX FOR TREATMENT   . Shortness of breath dyspnea   . Sleep apnea   . Toenail fungus     Past Surgical History:  Procedure Laterality Date  . ANKLE FRACTURE SURGERY Right   . BREAST ENHANCEMENT SURGERY    . CARPAL TUNNEL RELEASE Right    x2  . CHONDROPLASTY Right 02/17/2018   Procedure: CHONDROPLASTY;  Surgeon: Paralee Cancel, MD;  [facility];  Service: Orthopedics;  Laterality: Right;  . COLONOSCOPY WITH PROPOFOL N/A 05/07/2017   Procedure: COLONOSCOPY WITH PROPOFOL;  Surgeon: Rogene Houston, MD;  [facility];  Service: Endoscopy;  Laterality: N/A;  8:30  . ESOPHAGOGASTRODUODENOSCOPY (EGD) WITH PROPOFOL N/A 03/06/2019   Procedure: ESOPHAGOGASTRODUODENOSCOPY (EGD) WITH PROPOFOL;  Surgeon: Rogene Houston, MD;  [facility];  Service:  Endoscopy;  Laterality: N/A;  . HERNIA REPAIR     umbilical  . KNEE ARTHROSCOPY WITH MEDIAL MENISECTOMY Right 02/17/2018   Procedure: KNEE ARTHROSCOPY WITH MEDIAL MENISECTOMY;  Surgeon: Paralee Cancel, MD;  [facility];  Service: Orthopedics;  Laterality: Right;  . MALONEY DILATION  03/06/2019   Procedure: MALONEY DILATION;  Surgeon: Rogene Houston, MD;  [facility];  Service: Endoscopy;;  . NASAL POLYP SURGERY    . POLYPECTOMY      There were no vitals filed for this visit.  Subjective Assessment - 07/11/19 1606    Subjective  Patient reported that her ankle is bothering her and rates the pain as 4/10.    Pertinent History  History of broken left foot 4 years ago per patient report    Limitations  House hold activities;Standing;Walking    How long can you sit comfortably?  Not limited    How long can you stand comfortably?  2.5-3 hours    How long can you walk comfortably?  10-20 minutes of walking    Diagnostic tests  MRI: see chart review    Patient Stated Goals  To not have to have surgery    Currently in Pain?  Yes    Pain Score  4  Pain Location  Ankle    Pain Orientation  Left    Pain Descriptors / Indicators  Aching    Pain Type  Chronic pain    Pain Onset  More than a month ago                       [facility] Adult PT Treatment/Exercise - 07/11/19 0001      Manual Therapy   Manual Therapy  Soft tissue mobilization    Manual therapy comments  completed seperate from rest of treatment interventions    Soft tissue mobilization  pt supine, BLE elevated on wedge, STM to plantar fascitis, gastroc/soleus complex to decrease pain and improve flexiblity and for pain relief      Ankle Exercises: Seated   Towel Inversion/Eversion  Other (comment)   10x each direction   Other Seated Ankle Exercises  Arch formation x10 each LE      Ankle Exercises: Standing   Other Standing Ankle Exercises  SLS x3 LLE. Tandem stance x 30'' LLE back                PT Short Term Goals - 06/29/19 [id number]      PT SHORT TERM GOAL #1   Title  Patient will demonstrate understanding and report regular compliance with HEP to improve mobility.    Time  3    Period  Weeks    Status  Achieved    Target Date  06/16/19      PT SHORT TERM GOAL #2   Title  Patient will report an improvement of at least 50% in overall symptoms indicating an improvement in QOL.    Time  3    Period  Weeks    Status  Achieved    Target Date  06/16/19        PT Long Term Goals - 07/04/19 0941      PT LONG TERM GOAL #1   Title  Patient will demonstrate improvement in left ankle AROM of at least 5 degrees in all deficient planes of motion to improve gait mechanics and decrease pain.    Time  6    Period  Weeks    Status  Achieved      PT LONG TERM GOAL #2   Title  Patient will demonstrate ability to maintain single limb stance for at least 5 seconds on each LE in order to improve safety with stair negotiation.    Time  6    Period  Weeks    Status  Achieved      PT LONG TERM GOAL #3   Title  Patient will report that her pain has not exceeded a 3/10 maximum over the course of a 1 week period indicating an improvement in tolerance to daily activities.    Time  6    Period  Weeks    Status  Achieved      PT LONG TERM GOAL #4   Title  Patient will report ability to ambulate for at least 30 minutes in order to perform shopping activities with improved ease.    Time  6    Period  Weeks    Status  On-going      PT LONG TERM GOAL #5   Title  Patient will report an improvement of at least 75% in overall symptoms indicating an improvement in QOL.    Time  6    Period  Weeks    Status  Achieved      PT LONG TERM GOAL #6   Title  Patient will report that her pain has not exceeded a 1/10 maximum over the course of a 1 week period indicating an improvement in tolerance to daily activities.    Time  3    Period  Weeks    Status  On-going      PT LONG  TERM GOAL #7   Title  Patient will demonstrate ability to perform at least 1 single leg heel raise on the LT LE indicating improved plantarflexion strength to return to recreational walking for exercise.    Time  3    Period  Weeks    Status  On-going      PT LONG TERM GOAL #8   Title  Patient will demonstrate ability to perform single limb stance for at least 15 seconds on the LT LE indicating improved muscular stability and balance.    Time  3    Period  Weeks    Status  On-going            Plan - 07/11/19 1644    Clinical Impression Statement  Patient reported continuing to have an increased pain this session. Discussed with patient working on propping her foot up at work when possible in order to reduce swelling and decrease pain. Performed manual near the beginning of the session to reduce pain and work on mobility with patient reporting that this improved her pain "by 100%!". Worked on balance this session as well, introducing tandem stance this session as well as practicing SLS. Added tandem stance to patient's HEP.    Personal Factors and Comorbidities  Age;Time since onset of injury/illness/exacerbation;Comorbidity 3+    Comorbidities  HTN, GAD, Depression    Examination-Activity Limitations  Stand;Locomotion Level;Stairs;Squat    Examination-Participation Restrictions  Yard Work;Cleaning;Community Activity;Meal Prep;Laundry    Stability/Clinical Decision Making  Evolving/Moderate complexity    Rehab Potential  Good    PT Frequency  2x / week    PT Duration  3 weeks    PT Treatment/Interventions  ADLs/Self Care Home Management;Aquatic Therapy;Cryotherapy;Electrical Stimulation;Moist Heat;DME Instruction;Gait training;Stair training;Functional mobility training;Therapeutic activities;Therapeutic exercise;Balance training;Neuromuscular re-education;Patient/family education;Orthotic Fit/Training;Manual techniques;Passive range of motion;Dry needling;Energy conservation;Taping     PT Next Visit Plan  Work on single leg balance, slow progression of plantarflexion strengthening and dorsiflexion mobility. See MRI results for more detail regarding injury. Avoid excessive end range DF stretching or inversion of ankle due to results of MRI showing tendon tears.    PT Home Exercise Plan  05/26/19: Elevate and ice; 3/25: arch doming, ankle circles CW/CCW; 06/13/19: Ankle isometrics 4 directions 10x daily; 06/22/19: RTB partial range ankle iversion and eversion x10; 07/11/19: Tandem stance with LLE back    Consulted and Agree with Plan of Care  Patient       Patient will benefit from skilled therapeutic intervention in order to improve the following deficits and impairments:  Abnormal gait, Pain, Decreased mobility, Decreased activity tolerance, Decreased endurance, Decreased range of motion, Decreased strength, Hypomobility, Decreased balance, Difficulty walking, Increased edema  Visit Diagnosis: Stiffness of left ankle, not elsewhere classified  Localized edema  Muscle weakness (generalized)     Problem List Patient Active Problem List   Diagnosis Date Noted  . Flat feet, bilateral 07/05/2019  . Other chronic pain 06/21/2019  . Falls frequently 06/21/2019  . Cyst of knee joint 06/21/2019  . Oral phase dysphagia 01/24/2019  . GERD (gastroesophageal reflux disease) 01/04/2019  . Swollen  tongue 12/29/2018  . Dry mouth 12/22/2018  . Dysphagia 12/22/2018  . Hoarseness, chronic 12/22/2018  . Need for zoster vaccine 09/22/2018  . Hemorrhoid 08/11/2017  . Sigmoid diverticulosis 08/11/2017  . Essential hypertension 02/28/2015  . Joint pain 02/28/2015  . Easy bruising 02/19/2014  . Menometrorrhagia 07/14/2013  . Polymenorrhea 07/11/2013  . GAD (generalized anxiety disorder) 01/04/2013  . CMC arthritis, thumb, degenerative 04/02/2011  . 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/02/2011  . Obesity (BMI 30.0-34.9) 10/22/2009  . Depression with anxiety 10/22/2009  .  NICOTINE ADDICTION 10/09/2008  . Migraine 02/24/2007   Clarene Critchley PT, DPT 4:46 PM, 07/11/19 [facility] [address] [place], [place], [address] Phone: [phone]   Fax:  [phone]  Name: Zoe Bailey MRN: [id number] Date of Birth: 08-07-1965

## 2019-07-12 ENCOUNTER — Encounter (HOSPITAL_COMMUNITY): Payer: Self-pay | Admitting: Physical Therapy

## 2019-07-13 ENCOUNTER — Ambulatory Visit (HOSPITAL_COMMUNITY): Payer: 59 | Admitting: Physical Therapy

## 2019-07-13 DIAGNOSIS — M542 Cervicalgia: Secondary | ICD-10-CM | POA: Diagnosis not present

## 2019-07-13 DIAGNOSIS — M791 Myalgia, unspecified site: Secondary | ICD-10-CM | POA: Diagnosis not present

## 2019-07-13 DIAGNOSIS — G43719 Chronic migraine without aura, intractable, without status migrainosus: Secondary | ICD-10-CM | POA: Diagnosis not present

## 2019-07-18 ENCOUNTER — Ambulatory Visit (HOSPITAL_COMMUNITY): Payer: 59 | Admitting: Physical Therapy

## 2019-07-18 ENCOUNTER — Other Ambulatory Visit: Payer: Self-pay

## 2019-07-18 ENCOUNTER — Encounter (HOSPITAL_COMMUNITY): Payer: Self-pay | Admitting: Physical Therapy

## 2019-07-18 DIAGNOSIS — M25672 Stiffness of left ankle, not elsewhere classified: Secondary | ICD-10-CM

## 2019-07-18 DIAGNOSIS — M6281 Muscle weakness (generalized): Secondary | ICD-10-CM

## 2019-07-18 DIAGNOSIS — M25579 Pain in unspecified ankle and joints of unspecified foot: Secondary | ICD-10-CM | POA: Diagnosis not present

## 2019-07-18 DIAGNOSIS — G8929 Other chronic pain: Secondary | ICD-10-CM | POA: Diagnosis not present

## 2019-07-18 DIAGNOSIS — M25569 Pain in unspecified knee: Secondary | ICD-10-CM | POA: Diagnosis not present

## 2019-07-18 DIAGNOSIS — M79672 Pain in left foot: Secondary | ICD-10-CM | POA: Diagnosis not present

## 2019-07-18 DIAGNOSIS — G894 Chronic pain syndrome: Secondary | ICD-10-CM | POA: Diagnosis not present

## 2019-07-18 DIAGNOSIS — M25539 Pain in unspecified wrist: Secondary | ICD-10-CM | POA: Diagnosis not present

## 2019-07-18 DIAGNOSIS — M545 Low back pain: Secondary | ICD-10-CM | POA: Diagnosis not present

## 2019-07-18 DIAGNOSIS — R600 Localized edema: Secondary | ICD-10-CM

## 2019-07-18 NOTE — Therapy (Signed)
[place], [place], [address] Phone: [phone]   Fax:  [phone]  Physical Therapy Treatment  Patient Details  Name: Zoe Bailey MRN: [id number] Date of Birth: June 16, 1965 Referring Provider (PT): Persons, Bevely [place], [place]   Encounter Date: 07/18/2019  PT End of Session - 07/18/19 1608    Visit Number  14    Number of Visits  16    Date for PT Re-Evaluation  07/20/19    Authorization Type  [employer] Josem Kaufmann required after 25th visit)    Authorization - Visit Number  14    Authorization - Number of Visits  25    Progress Note Due on Visit  20    PT Start Time  1601    PT Stop Time  1639    PT Time Calculation (min)  38 min    Activity Tolerance  Patient tolerated treatment well;No increased pain    Behavior During Therapy  WFL for tasks assessed/performed       Past Medical History:  Diagnosis Date  . Arthritis   . Cancer ([place]) 2010 approx   skin cancer  . Complication of anesthesia    Wakes up during all surgeries  . COPD (chronic obstructive pulmonary disease) ([place])   . Depression   . Foot pain, right 03/2014  . Migraine    USES BOTOX FOR TREATMENT   . Shortness of breath dyspnea   . Sleep apnea   . Toenail fungus     Past Surgical History:  Procedure Laterality Date  . ANKLE FRACTURE SURGERY Right   . BREAST ENHANCEMENT SURGERY    . CARPAL TUNNEL RELEASE Right    x2  . CHONDROPLASTY Right 02/17/2018   Procedure: CHONDROPLASTY;  Surgeon: Paralee Cancel, MD;  Location: [facility];  Service: Orthopedics;  Laterality: Right;  . COLONOSCOPY WITH PROPOFOL N/A 05/07/2017   Procedure: COLONOSCOPY WITH PROPOFOL;  Surgeon: Rogene Houston, MD;  Location: [facility];  Service: Endoscopy;  Laterality: N/A;  8:30  . ESOPHAGOGASTRODUODENOSCOPY (EGD) WITH PROPOFOL N/A 03/06/2019   Procedure: ESOPHAGOGASTRODUODENOSCOPY (EGD) WITH PROPOFOL;  Surgeon: Rogene Houston, MD;  [facility]: [facility];  Service:  Endoscopy;  Laterality: N/A;  . HERNIA REPAIR     umbilical  . KNEE ARTHROSCOPY WITH MEDIAL MENISECTOMY Right 02/17/2018   Procedure: KNEE ARTHROSCOPY WITH MEDIAL MENISECTOMY;  Surgeon: Paralee Cancel, MD;  Location: [facility];  Service: Orthopedics;  Laterality: Right;  . MALONEY DILATION  03/06/2019   Procedure: MALONEY DILATION;  Surgeon: Rogene Houston, MD;  Location: [facility];  Service: Endoscopy;;  . NASAL POLYP SURGERY    . POLYPECTOMY      There were no vitals filed for this visit.  Subjective Assessment - 07/18/19 1607    Subjective  Patient reported pain in the anterior aspect of foot.    Pertinent History  History of broken left foot 4 years ago per patient report    Limitations  House hold activities;Standing;Walking    How long can you sit comfortably?  Not limited    How long can you stand comfortably?  2.5-3 hours    How long can you walk comfortably?  10-20 minutes of walking    Diagnostic tests  MRI: see chart review    Patient Stated Goals  To not have to have surgery    Currently in Pain?  Yes    Pain Score  1     Pain Location  Ankle    Pain Orientation  Anterior    Pain Descriptors / Indicators  Aching    Pain Type  Chronic pain    Pain Onset  More than a month ago                       [facility] Adult PT Treatment/Exercise - 07/18/19 0001      Manual Therapy   Manual Therapy  Soft tissue mobilization    Manual therapy comments  completed seperate from rest of treatment interventions    Soft tissue mobilization  pt supine, BLE elevated on wedge, STM to plantar fascitis, gastroc/soleus complex to decrease pain and improve flexiblity and for pain relief      Ankle Exercises: Seated   Towel Inversion/Eversion  Other (comment)   5x each direction   Other Seated Ankle Exercises  Marble pickup x10 on LT LE. Arch formation x10 each LE      Ankle Exercises: Standing   Other Standing Ankle Exercises  Tandem stance 2x 30'' alternating legs on foam                PT Short Term Goals - 06/29/19 [id number]      PT SHORT TERM GOAL #1   Title  Patient will demonstrate understanding and report regular compliance with HEP to improve mobility.    Time  3    Period  Weeks    Status  Achieved    Target Date  06/16/19      PT SHORT TERM GOAL #2   Title  Patient will report an improvement of at least 50% in overall symptoms indicating an improvement in QOL.    Time  3    Period  Weeks    Status  Achieved    Target Date  06/16/19        PT Long Term Goals - 07/04/19 0941      PT LONG TERM GOAL #1   Title  Patient will demonstrate improvement in left ankle AROM of at least 5 degrees in all deficient planes of motion to improve gait mechanics and decrease pain.    Time  6    Period  Weeks    Status  Achieved      PT LONG TERM GOAL #2   Title  Patient will demonstrate ability to maintain single limb stance for at least 5 seconds on each LE in order to improve safety with stair negotiation.    Time  6    Period  Weeks    Status  Achieved      PT LONG TERM GOAL #3   Title  Patient will report that her pain has not exceeded a 3/10 maximum over the course of a 1 week period indicating an improvement in tolerance to daily activities.    Time  6    Period  Weeks    Status  Achieved      PT LONG TERM GOAL #4   Title  Patient will report ability to ambulate for at least 30 minutes in order to perform shopping activities with improved ease.    Time  6    Period  Weeks    Status  On-going      PT LONG TERM GOAL #5   Title  Patient will report an improvement of at least 75% in overall symptoms indicating an improvement in QOL.    Time  6    Period  Weeks  Status  Achieved      PT LONG TERM GOAL #6   Title  Patient will report that her pain has not exceeded a 1/10 maximum over the course of a 1 week period indicating an improvement in tolerance to daily activities.    Time  3    Period  Weeks    Status  On-going      PT  LONG TERM GOAL #7   Title  Patient will demonstrate ability to perform at least 1 single leg heel raise on the LT LE indicating improved plantarflexion strength to return to recreational walking for exercise.    Time  3    Period  Weeks    Status  On-going      PT LONG TERM GOAL #8   Title  Patient will demonstrate ability to perform single limb stance for at least 15 seconds on the LT LE indicating improved muscular stability and balance.    Time  3    Period  Weeks    Status  On-going            Plan - 07/18/19 1644    Clinical Impression Statement  Patient reported feeling much better following last session, so continued focusing on exercises from last session. Did increase the difficulty of the tandem stance on foam this session. Also added marble pick-up with the toes this session to increase intrinsic foot strength. Patient is doing well in therapy. Plan to re-assess patient's goals next session.    Personal Factors and Comorbidities  Age;Time since onset of injury/illness/exacerbation;Comorbidity 3+    Comorbidities  HTN, GAD, Depression    Examination-Activity Limitations  Stand;Locomotion Level;Stairs;Squat    Examination-Participation Restrictions  Yard Work;Cleaning;Community Activity;Meal Prep;Laundry    Stability/Clinical Decision Making  Evolving/Moderate complexity    Rehab Potential  Good    PT Frequency  2x / week    PT Duration  3 weeks    PT Treatment/Interventions  ADLs/Self Care Home Management;Aquatic Therapy;Cryotherapy;Electrical Stimulation;Moist Heat;DME Instruction;Gait training;Stair training;Functional mobility training;Therapeutic activities;Therapeutic exercise;Balance training;Neuromuscular re-education;Patient/family education;Orthotic Fit/Training;Manual techniques;Passive range of motion;Dry needling;Energy conservation;Taping    PT Next Visit Plan  Re-assess goals next session. Work on single leg balance, slow progression of plantarflexion  strengthening and dorsiflexion mobility. See MRI results for more detail regarding injury. Avoid excessive end range DF stretching or inversion of ankle due to results of MRI showing tendon tears.    PT Home Exercise Plan  05/26/19: Elevate and ice; 3/25: arch doming, ankle circles CW/CCW; 06/13/19: Ankle isometrics 4 directions 10x daily; 06/22/19: RTB partial range ankle iversion and eversion x10; 07/11/19: Tandem stance with LLE back    Consulted and Agree with Plan of Care  Patient       Patient will benefit from skilled therapeutic intervention in order to improve the following deficits and impairments:  Abnormal gait, Pain, Decreased mobility, Decreased activity tolerance, Decreased endurance, Decreased range of motion, Decreased strength, Hypomobility, Decreased balance, Difficulty walking, Increased edema  Visit Diagnosis: Stiffness of left ankle, not elsewhere classified  Localized edema  Muscle weakness (generalized)     Problem List Patient Active Problem List   Diagnosis Date Noted  . Flat feet, bilateral 07/05/2019  . Other chronic pain 06/21/2019  . Falls frequently 06/21/2019  . Cyst of knee joint 06/21/2019  . Oral phase dysphagia 01/24/2019  . GERD (gastroesophageal reflux disease) 01/04/2019  . Swollen tongue 12/29/2018  . Dry mouth 12/22/2018  . Dysphagia 12/22/2018  . Hoarseness, chronic 12/22/2018  .  Need for zoster vaccine 09/22/2018  . Hemorrhoid 08/11/2017  . Sigmoid diverticulosis 08/11/2017  . Essential hypertension 02/28/2015  . Joint pain 02/28/2015  . Easy bruising 02/19/2014  . Menometrorrhagia 07/14/2013  . Polymenorrhea 07/11/2013  . GAD (generalized anxiety disorder) 01/04/2013  . CMC arthritis, thumb, degenerative 04/02/2011  . 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/02/2011  . Obesity (BMI 30.0-34.9) 10/22/2009  . Depression with anxiety 10/22/2009  . NICOTINE ADDICTION 10/09/2008  . Migraine 02/24/2007   Clarene Critchley PT, DPT 4:47  PM, 07/18/19 [place], [place], [address] Phone: [phone]   Fax:  [phone]  Name: Zoe Bailey MRN: [id number] Date of Birth: 1965/12/09

## 2019-07-20 ENCOUNTER — Ambulatory Visit: Payer: 59 | Admitting: Orthopedic Surgery

## 2019-07-20 ENCOUNTER — Telehealth (HOSPITAL_COMMUNITY): Payer: Self-pay | Admitting: Physical Therapy

## 2019-07-20 ENCOUNTER — Ambulatory Visit (HOSPITAL_COMMUNITY): Payer: 59 | Admitting: Physical Therapy

## 2019-07-20 NOTE — Telephone Encounter (Signed)
pt cancelled appt because she had another MD appt

## 2019-07-21 ENCOUNTER — Other Ambulatory Visit: Payer: Self-pay | Admitting: Family Medicine

## 2019-07-25 ENCOUNTER — Telehealth (HOSPITAL_COMMUNITY): Payer: Self-pay | Admitting: Physical Therapy

## 2019-07-25 ENCOUNTER — Ambulatory Visit (HOSPITAL_COMMUNITY): Payer: 59 | Admitting: Physical Therapy

## 2019-07-25 NOTE — Telephone Encounter (Signed)
Pt requested to cx this apptment and be discharged today she is doing well.

## 2019-07-27 ENCOUNTER — Encounter (HOSPITAL_COMMUNITY): Payer: Self-pay | Admitting: Physical Therapy

## 2019-07-27 NOTE — Therapy (Signed)
[place], [place], [address] Phone: [phone]   Fax:  [phone]  Patient Details  Name: Zoe Bailey MRN: [id number] Date of Birth: 1966-02-26 Referring Provider:  No ref. provider found  Encounter Date: 07/27/2019   PHYSICAL THERAPY DISCHARGE SUMMARY  Visits from Start of Care: 14  Current functional level related to goals / functional outcomes: Patient did not return for final re-assessment. These are the goals from the most recent re-assessment:  PT Short Term Goals - 06/29/19 0921            PT Warrior #1   Title  Patient will demonstrate understanding and report regular compliance with HEP to improve mobility.    Time  3    Period  Weeks    Status  Achieved    Target Date  06/16/19        PT SHORT TERM GOAL #2   Title  Patient will report an improvement of at least 50% in overall symptoms indicating an improvement in QOL.    Time  3    Period  Weeks    Status  Achieved    Target Date  06/16/19           PT Long Term Goals - 07/04/19 0941            PT LONG TERM GOAL #1   Title  Patient will demonstrate improvement in left ankle AROM of at least 5 degrees in all deficient planes of motion to improve gait mechanics and decrease pain.    Time  6    Period  Weeks    Status  Achieved        PT LONG TERM GOAL #2   Title  Patient will demonstrate ability to maintain single limb stance for at least 5 seconds on each LE in order to improve safety with stair negotiation.    Time  6    Period  Weeks    Status  Achieved        PT LONG TERM GOAL #3   Title  Patient will report that her pain has not exceeded a 3/10 maximum over the course of a 1 week period indicating an improvement in tolerance to daily activities.    Time  6    Period  Weeks    Status  Achieved        PT LONG TERM GOAL #4   Title  Patient will report ability to ambulate for at least 30  minutes in order to perform shopping activities with improved ease.    Time  6    Period  Weeks    Status  On-going        PT LONG TERM GOAL #5   Title  Patient will report an improvement of at least 75% in overall symptoms indicating an improvement in QOL.    Time  6    Period  Weeks    Status  Achieved        PT LONG TERM GOAL #6   Title  Patient will report that her pain has not exceeded a 1/10 maximum over the course of a 1 week period indicating an improvement in tolerance to daily activities.    Time  3    Period  Weeks    Status  On-going        PT LONG TERM GOAL #7   Title  Patient will demonstrate ability to  perform at least 1 single leg heel raise on the LT LE indicating improved plantarflexion strength to return to recreational walking for exercise.    Time  3    Period  Weeks    Status  On-going        PT LONG TERM GOAL #8   Title  Patient will demonstrate ability to perform single limb stance for at least 15 seconds on the LT LE indicating improved muscular stability and balance.    Time  3    Period  Weeks    Status  On-going       Remaining deficits: See above   Education / Equipment: Updated HEP  Plan: Patient agrees to discharge.  Patient goals were partially met. Patient is being discharged due to the patient's request.  ?????    Clarene Critchley PT, DPT 4:27 PM, 07/27/19 [place], [place], [address] Phone: [phone]   Fax:  [phone]

## 2019-07-31 ENCOUNTER — Other Ambulatory Visit: Payer: Self-pay | Admitting: Family Medicine

## 2019-07-31 MED FILL — BUPROPION HCL ER (XL) 300 M: 300 | 90 days supply | Qty: 90 | Fill #1

## 2019-08-03 ENCOUNTER — Encounter: Payer: Self-pay | Admitting: Family Medicine

## 2019-08-03 MED FILL — CYCLOBENZAPRINE HCL 10 MG T: 10 | 10 days supply | Qty: 30 | Fill #0

## 2019-08-08 MED FILL — TRIAMTERENE-HCTZ 75-50 MG T: 75-50 | 90 days supply | Qty: 90 | Fill #3

## 2019-08-08 MED FILL — ESOMEPRAZOLE MAG DR 40 MG C: 40 | 30 days supply | Qty: 30 | Fill #5

## 2019-08-10 ENCOUNTER — Ambulatory Visit (INDEPENDENT_AMBULATORY_CARE_PROVIDER_SITE_OTHER): Payer: 59 | Admitting: Orthopedic Surgery

## 2019-08-10 ENCOUNTER — Encounter: Payer: Self-pay | Admitting: Orthopedic Surgery

## 2019-08-10 ENCOUNTER — Other Ambulatory Visit: Payer: Self-pay

## 2019-08-10 ENCOUNTER — Telehealth: Payer: Self-pay | Admitting: Radiology

## 2019-08-10 ENCOUNTER — Ambulatory Visit: Payer: 59

## 2019-08-10 VITALS — BP 150/103 | HR 98 | Ht 62.0 in | Wt 171.4 lb

## 2019-08-10 DIAGNOSIS — M25561 Pain in right knee: Secondary | ICD-10-CM

## 2019-08-10 DIAGNOSIS — G8929 Other chronic pain: Secondary | ICD-10-CM | POA: Diagnosis not present

## 2019-08-10 NOTE — Patient Instructions (Addendum)
Smoking Tobacco Information, Adult Smoking tobacco can be harmful to your health. Tobacco contains a poisonous (toxic), colorless chemical called nicotine. Nicotine is addictive. It changes the brain and can make it hard to stop smoking. Tobacco also has other toxic chemicals that can hurt your body and raise your risk of many cancers. How can smoking tobacco affect me? Smoking tobacco puts you at risk for:  Cancer. Smoking is most commonly associated with lung cancer, but can also lead to cancer in other parts of the body.  Chronic obstructive pulmonary disease (COPD). This is a long-term lung condition that makes it hard to breathe. It also gets worse over time.  High blood pressure (hypertension), heart disease, stroke, or heart attack.  Lung infections, such as pneumonia.  Cataracts. This is when the lenses in the eyes become clouded.  Digestive problems. This may include peptic ulcers, heartburn, and gastroesophageal reflux disease (GERD).  Oral health problems, such as gum disease and tooth loss.  Loss of taste and smell. Smoking can affect your appearance by causing:  Wrinkles.  Yellow or stained teeth, fingers, and fingernails. Smoking tobacco can also affect your social life, because:  It may be challenging to find places to smoke when away from home. Many workplaces, restaurants, hotels, and public places are tobacco-free.  Smoking is expensive. This is due to the cost of tobacco and the long-term costs of treating health problems from smoking.  Secondhand smoke may affect those around you. Secondhand smoke can cause lung cancer, breathing problems, and heart disease. Children of smokers have a higher risk for: ? Sudden infant death syndrome (SIDS). ? Ear infections. ? Lung infections. If you currently smoke tobacco, quitting now can help you:  Lead a longer and healthier life.  Look, smell, breathe, and feel better over time.  Save money.  Protect others from the  harms of secondhand smoke. What actions can I take to prevent health problems? Quit smoking   Do not start smoking. Quit if you already do.  Make a plan to quit smoking and commit to it. Look for programs to help you and ask your health care provider for recommendations and ideas.  Set a date and write down all the reasons you want to quit.  Let your friends and family know you are quitting so they can help and support you. Consider finding friends who also want to quit. It can be easier to quit with someone else, so that you can support each other.  Talk with your health care provider about using nicotine replacement medicines to help you quit, such as gum, lozenges, patches, sprays, or pills.  Do not replace cigarette smoking with electronic cigarettes, which are commonly called e-cigarettes. The safety of e-cigarettes is not known, and some may contain harmful chemicals.  If you try to quit but return to smoking, stay positive. It is common to slip up when you first quit, so take it one day at a time.  Be prepared for cravings. When you feel the urge to smoke, chew gum or suck on hard candy. Lifestyle  Stay busy and take care of your body.  Drink enough fluid to keep your urine pale yellow.  Get plenty of exercise and eat a healthy diet. This can help prevent weight gain after quitting.  Monitor your eating habits. Quitting smoking can cause you to have a larger appetite than when you smoke.  Find ways to relax. Go out with friends or family to a movie or a restaurant   where people do not smoke.  Ask your health care provider about having regular tests (screenings) to check for cancer. This may include blood tests, imaging tests, and other tests.  Find ways to manage your stress, such as meditation, yoga, or exercise. Where to find support To get support to quit smoking, consider:  Asking your health care provider for more information and resources.  Taking classes to learn  more about quitting smoking.  Looking for local organizations that offer resources about quitting smoking.  Joining a support group for people who want to quit smoking in your local community.  Calling the smokefree.gov counselor helpline: 1-800-Quit-Now 434-456-9636) Where to find more information You may find more information about quitting smoking from:  HelpGuide.org: www.helpguide.org  https://hall.com/: smokefree.gov  American Lung Association: www.lung.org Contact a health care provider if you:  Have problems breathing.  Notice that your lips, nose, or fingers turn blue.  Have chest pain.  Are coughing up blood.  Feel faint or you pass out.  Have other health changes that cause you to worry. Summary  Smoking tobacco can negatively affect your health, the health of those around you, your finances, and your social life.  Do not start smoking. Quit if you already do. If you need help quitting, ask your health care provider.  Think about joining a support group for people who want to quit smoking in your local community. There are many effective programs that will help you to quit this behavior. This information is not intended to replace advice given to you by your health care provider. Make sure you discuss any questions you have with your health care provider. Document Revised: 11/18/2018 Document Reviewed: 03/10/2016 Elsevier Patient Education  2020 [place].  Osteoarthritis  Osteoarthritis is a type of arthritis that affects tissue that covers the ends of bones in joints (cartilage). Cartilage acts as a cushion between the bones and helps them move smoothly. Osteoarthritis results when cartilage in the joints gets worn down. Osteoarthritis is sometimes called "wear and tear" arthritis. Osteoarthritis is the most common form of arthritis. It often occurs in older people. It is a condition that gets worse over time (a progressive condition). Joints that are most  often affected by this condition are in:  Fingers.  Toes.  Hips.  Knees.  Spine, including neck and lower back. What are the causes? This condition is caused by age-related wearing down of cartilage that covers the ends of bones. What increases the risk? The following factors may make you more likely to develop this condition:  Older age.  Being overweight or obese.  Overuse of joints, such as in athletes.  Past injury of a joint.  Past surgery on a joint.  Family history of osteoarthritis. What are the signs or symptoms? The main symptoms of this condition are pain, swelling, and stiffness in the joint. The joint may lose its shape over time. Small pieces of bone or cartilage may break off and float inside of the joint, which may cause more pain and damage to the joint. Small deposits of bone (osteophytes) may grow on the edges of the joint. Other symptoms may include:  A grating or scraping feeling inside the joint when you move it.  Popping or creaking sounds when you move. Symptoms may affect one or more joints. Osteoarthritis in a major joint, such as your knee or hip, can make it painful to walk or exercise. If you have osteoarthritis in your hands, you might not be able to  grip items, twist your hand, or control small movements of your hands and fingers (fine motor skills). How is this diagnosed? This condition may be diagnosed based on:  Your medical history.  A physical exam.  Your symptoms.  X-rays of the affected joint(s).  Blood tests to rule out other types of arthritis. How is this treated? There is no cure for this condition, but treatment can help to control pain and improve joint function. Treatment plans may include:  A prescribed exercise program that allows for rest and joint relief. You may work with a physical therapist.  A weight control plan.  Pain relief techniques, such as: ? Applying heat and cold to the joint. ? Electric pulses  delivered to nerve endings under the skin (transcutaneous electrical nerve stimulation, or TENS). ? Massage. ? Certain nutritional supplements.  NSAIDs or prescription medicines to help relieve pain.  Medicine to help relieve pain and inflammation (corticosteroids). This can be given by mouth (orally) or as an injection.  Assistive devices, such as a brace, wrap, splint, specialized glove, or cane.  Surgery, such as: ? An osteotomy. This is done to reposition the bones and relieve pain or to remove loose pieces of bone and cartilage. ? Joint replacement surgery. You may need this surgery if you have very bad (advanced) osteoarthritis. Follow these instructions at home: Activity  Rest your affected joints as directed by your health care provider.  Do not drive or use heavy machinery while taking prescription pain medicine.  Exercise as directed. Your health care provider or physical therapist may recommend specific types of exercise, such as: ? Strengthening exercises. These are done to strengthen the muscles that support joints that are affected by arthritis. They can be performed with weights or with exercise bands to add resistance. ? Aerobic activities. These are exercises, such as brisk walking or water aerobics, that get your heart pumping. ? Range-of-motion activities. These keep your joints easy to move. ? Balance and agility exercises. Managing pain, stiffness, and swelling      If directed, apply heat to the affected area as often as told by your health care provider. Use the heat source that your health care provider recommends, such as a moist heat pack or a heating pad. ? If you have a removable assistive device, remove it as told by your health care provider. ? Place a towel between your skin and the heat source. If your health care provider tells you to keep the assistive device on while you apply heat, place a towel between the assistive device and the heat  source. ? Leave the heat on for 20-30 minutes. ? Remove the heat if your skin turns bright red. This is especially important if you are unable to feel pain, heat, or cold. You may have a greater risk of getting burned.  If directed, put ice on the affected joint: ? If you have a removable assistive device, remove it as told by your health care provider. ? Put ice in a plastic bag. ? Place a towel between your skin and the bag. If your health care provider tells you to keep the assistive device on during icing, place a towel between the assistive device and the bag. ? Leave the ice on for 20 minutes, 2-3 times a day. General instructions  Take over-the-counter and prescription medicines only as told by your health care provider.  Maintain a healthy weight. Follow instructions from your health care provider for weight control. These may include dietary  restrictions.  Do not use any products that contain nicotine or tobacco, such as cigarettes and e-cigarettes. These can delay bone healing. If you need help quitting, ask your health care provider.  Use assistive devices as directed by your health care provider.  Keep all follow-up visits as told by your health care provider. This is important. Where to find more information  [employer] of Arthritis and Musculoskeletal and Skin Diseases: www.niams.SouthExposed.es  [employer] on Aging: http://kim-miller.com/  American College of Rheumatology: www.rheumatology.org Contact a health care provider if:  Your skin turns red.  You develop a rash.  You have pain that gets worse.  You have a fever along with joint or muscle aches. Get help right away if:  You lose a lot of weight.  You suddenly lose your appetite.  You have night sweats. Summary  Osteoarthritis is a type of arthritis that affects tissue covering the ends of bones in joints (cartilage).  This condition is caused by age-related wearing down of cartilage that covers  the ends of bones.  The main symptom of this condition is pain, swelling, and stiffness in the joint.  There is no cure for this condition, but treatment can help to control pain and improve joint function. This information is not intended to replace advice given to you by your health care provider. Make sure you discuss any questions you have with your health care provider. Document Revised: 02/05/2017 Document Reviewed: 10/28/2015 Elsevier Patient Education  2020 [employer].

## 2019-08-10 NOTE — Telephone Encounter (Signed)
Her first visit was today, I have advised Dr Aline Brochure we have to see her back to see how the steroid injection helped and document before we can proceed with the Durolane.   Hold the visco approval until after next visit.

## 2019-08-10 NOTE — Progress Notes (Signed)
Chief Complaint  Patient presents with  . Knee Pain    R/cyst in back of knee/2 yrs of pain    History Zoe Bailey is 54 years old she had arthroscopic surgery by Dr. Ihor Gully at [place] for torn meniscus and osteoarthritis of the knee.  She presents to [place] with right knee pain and feeling of a cyst still on the back of the knee.  She was concerned that she was told that once meniscus was fixed the cyst would go away.  I informed her that this is usually the case.  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  I discussed with her that this is a no win situation that recent studies have shown that patients with arthritis and symptomatic meniscal tear requiring meniscectomy often have increased pain which she says she does although the mechanical symptoms resolved with the surgery  Review of systems is negative all systems reviewed and were negative  Past Medical History:  Diagnosis Date  . Arthritis   . Cancer ([place]) 2010 approx   skin cancer  . Complication of anesthesia    Wakes up during all surgeries  . COPD (chronic obstructive pulmonary disease) ([place])   . Depression   . Foot pain, right 03/2014  . Migraine    USES BOTOX FOR TREATMENT   . Shortness of breath dyspnea   . Sleep apnea   . Toenail fungus    Past Surgical History:  Procedure Laterality Date  . ANKLE FRACTURE SURGERY Right   . BREAST ENHANCEMENT SURGERY    . CARPAL TUNNEL RELEASE Right    x2  . CHONDROPLASTY Right 02/17/2018   Procedure: CHONDROPLASTY;  Surgeon: Paralee Cancel, MD;  Location: [facility];  Service: Orthopedics;  Laterality: Right;  . COLONOSCOPY WITH PROPOFOL N/A 05/07/2017   Procedure: COLONOSCOPY WITH PROPOFOL;  Surgeon: Rogene Houston, MD;  Location: [facility];  Service: Endoscopy;  Laterality:  N/A;  8:30  . ESOPHAGOGASTRODUODENOSCOPY (EGD) WITH PROPOFOL N/A 03/06/2019   Procedure: ESOPHAGOGASTRODUODENOSCOPY (EGD) WITH PROPOFOL;  Surgeon: Rogene Houston, MD;  Location: [facility];  Service: Endoscopy;  Laterality: N/A;  . HERNIA REPAIR     umbilical  . KNEE ARTHROSCOPY WITH MEDIAL MENISECTOMY Right 02/17/2018   Procedure: KNEE ARTHROSCOPY WITH MEDIAL MENISECTOMY;  Surgeon: Paralee Cancel, MD;  Location: [facility];  Service: Orthopedics;  Laterality: Right;  . MALONEY DILATION  03/06/2019   Procedure: MALONEY DILATION;  Surgeon: Rogene Houston, MD;  Location: [facility];  Service: Endoscopy;;  . NASAL POLYP SURGERY    . POLYPECTOMY      Current Outpatient Medications:  .  buPROPion (WELLBUTRIN XL) 300 MG 24 hr tablet, TAKE 1 TABLET BY MOUTH DAILY., Disp: 90 tablet, Rfl: 1 .  busPIRone (BUSPAR) 10 MG tablet, TAKE 1 TABLET BY MOUTH 3 TIMES DAILY., Disp: 90 tablet, Rfl: 3 .  cyclobenzaprine (FLEXERIL) 10 MG tablet, TAKE 1 TABLET BY MOUTH 3 TIMES DAILY AS NEEDED FOR MUSCLE SPASMS., Disp: 30 tablet, Rfl: 2 .  diclofenac (VOLTAREN) 75 MG EC tablet, TAKE 1 TABLET BY MOUTH ONCE DAILY AS NEEDED FOR PAIN, Disp: 30 tablet, Rfl: 2 .  DULoxetine (CYMBALTA) 20 MG capsule, Take 20 mg by mouth daily., Disp: , Rfl:  .  esomeprazole (NEXIUM) 40 MG capsule, Take 1 capsule (40  mg total) by mouth daily before breakfast., Disp: 30 capsule, Rfl: 5 .  gabapentin (NEURONTIN) 400 MG capsule, Take two capsules by mouth every morning for migraine prevention (Patient taking differently: Take 800 mg by mouth every morning. Take two capsules by mouth every morning for migraine prevention), Disp: 180 capsule, Rfl: 3 .  Multiple Vitamin (MULTIVITAMIN WITH MINERALS) TABS tablet, Take 2 tablets by mouth daily. , Disp: , Rfl:  .  OnabotulinumtoxinA (BOTOX IM), Inject 1 Dose into the muscle every 3 (three) months., Disp: , Rfl:  .  tiotropium (SPIRIVA HANDIHALER) 18 MCG inhalation capsule, Place 1 capsule (18 mcg  total) into inhaler and inhale daily for 30 days. (Patient taking differently: Place 18 mcg into inhaler and inhale daily as needed (shortness of breath). ), Disp: 30 capsule, Rfl: 3 .  triamterene-hydrochlorothiazide (MAXZIDE) 75-50 MG tablet, Take 1 tablet by mouth daily., Disp: 90 tablet, Rfl: 3 .  UNABLE TO FIND, Blood pressure monitor x 1  DX I10, Disp: 1 each, Rfl: 0 BP (!) 150/103   Pulse 98   Ht 5\' 2"  (1.575 m)   Wt 171 lb 6 oz (77.7 kg)   LMP 08/21/2017 (Approximate) Comment: preg. test negative  BMI 31.34 kg/m   She is awake alert and oriented x3 mood and affect are normal her appearance is normal her gait is normal without assistive device although her cadence is slow in her stride length is short  Right knee Her medial joint line is tender her range of motion is 125 degrees all ligaments are stable muscle tone and strength are normal skin is intact pulses are good sensation is normal balance is good coordination is normal  Left knee nontender full range of motion no swelling  X-rays show osteoarthritis medial compartment with a narrowed medial compartment mild secondary bone changes.  Starting to drift towards varus.  Encounter Diagnosis  Name Primary?  . Chronic pain of right knee Yes   Plan most of the treatment is about patient education educating her on the problems that she is going to have in this knee as I discussed above.  I did show her a model I went over the biomechanical problems of removing a meniscus in an arthritic compartment and that there really is no choice and that case.  She may have some fullness in the back of the knee when the knee swells.  We discussed that we will continue with anti-inflammatory medications I injected the knee and put her for follow-up to see if Durolane will be needed if she does not respond to the cortisone  Procedure note right knee injection   verbal consent was obtained to inject right knee joint  Timeout was completed to  confirm the site of injection  The medications used were 40 mg of Depo-Medrol and 1% lidocaine 3 cc  Anesthesia was provided by ethyl chloride and the skin was prepped with alcohol.  After cleaning the skin with alcohol a 20-gauge needle was used to inject the right knee joint. There were no complications. A sterile bandage was applied.

## 2019-08-10 NOTE — Telephone Encounter (Signed)
[facility] Dr Aline Brochure wants to get approval for Durolane

## 2019-08-21 ENCOUNTER — Other Ambulatory Visit: Payer: Self-pay | Admitting: Family Medicine

## 2019-08-21 MED FILL — DULoxetine HCL 30 MG CPEP: 30 | 30 days supply | Qty: 30 | Fill #1

## 2019-08-21 MED FILL — DICLOFENAC SOD EC 75 MG TAB: 75 | 30 days supply | Qty: 30 | Fill #0

## 2019-08-21 MED FILL — CYCLOBENZAPRINE HCL 10 MG T: 10 | 10 days supply | Qty: 30 | Fill #1

## 2019-08-21 NOTE — Telephone Encounter (Signed)
Closing this message for now.  Please send new message if applicable when pt comes back in.

## 2019-08-29 ENCOUNTER — Other Ambulatory Visit (HOSPITAL_COMMUNITY): Payer: Self-pay | Admitting: Specialist

## 2019-08-29 DIAGNOSIS — G8929 Other chronic pain: Secondary | ICD-10-CM | POA: Diagnosis not present

## 2019-08-29 DIAGNOSIS — M79672 Pain in left foot: Secondary | ICD-10-CM | POA: Diagnosis not present

## 2019-08-29 DIAGNOSIS — M25569 Pain in unspecified knee: Secondary | ICD-10-CM | POA: Diagnosis not present

## 2019-08-29 DIAGNOSIS — M25539 Pain in unspecified wrist: Secondary | ICD-10-CM | POA: Diagnosis not present

## 2019-08-29 DIAGNOSIS — M25579 Pain in unspecified ankle and joints of unspecified foot: Secondary | ICD-10-CM | POA: Diagnosis not present

## 2019-08-29 DIAGNOSIS — M545 Low back pain: Secondary | ICD-10-CM | POA: Diagnosis not present

## 2019-08-29 MED FILL — DULoxetine HCL 60 MG CPEP: 60 | 30 days supply | Qty: 30 | Fill #0

## 2019-08-29 MED FILL — traMADol HCL 50 MG TABS: 50 | 30 days supply | Qty: 120 | Fill #0

## 2019-08-29 MED FILL — BOTOX 100 UNITS VIAL: 100 | 84 days supply | Qty: 2 | Fill #0

## 2019-08-31 DIAGNOSIS — G518 Other disorders of facial nerve: Secondary | ICD-10-CM | POA: Diagnosis not present

## 2019-08-31 DIAGNOSIS — M542 Cervicalgia: Secondary | ICD-10-CM | POA: Diagnosis not present

## 2019-08-31 DIAGNOSIS — G43719 Chronic migraine without aura, intractable, without status migrainosus: Secondary | ICD-10-CM | POA: Diagnosis not present

## 2019-08-31 DIAGNOSIS — M791 Myalgia, unspecified site: Secondary | ICD-10-CM | POA: Diagnosis not present

## 2019-09-04 ENCOUNTER — Other Ambulatory Visit: Payer: Self-pay | Admitting: Family Medicine

## 2019-09-04 MED FILL — GABAPENTIN 400 MG CAPSULE: 400 | 90 days supply | Qty: 180 | Fill #0

## 2019-09-12 ENCOUNTER — Other Ambulatory Visit (INDEPENDENT_AMBULATORY_CARE_PROVIDER_SITE_OTHER): Payer: Self-pay | Admitting: Gastroenterology

## 2019-09-12 ENCOUNTER — Other Ambulatory Visit (INDEPENDENT_AMBULATORY_CARE_PROVIDER_SITE_OTHER): Payer: Self-pay | Admitting: Internal Medicine

## 2019-09-12 MED FILL — ESOMEPRAZOLE MAG DR 40 MG C: 40 | 30 days supply | Qty: 30 | Fill #0

## 2019-09-12 MED FILL — CYCLOBENZAPRINE HCL 10 MG T: 10 | 10 days supply | Qty: 30 | Fill #2

## 2019-09-14 MED FILL — DICLOFENAC SOD EC 75 MG TAB: 75 | 30 days supply | Qty: 30 | Fill #1

## 2019-09-21 ENCOUNTER — Other Ambulatory Visit: Payer: Self-pay

## 2019-09-21 ENCOUNTER — Ambulatory Visit (INDEPENDENT_AMBULATORY_CARE_PROVIDER_SITE_OTHER): Payer: 59 | Admitting: Orthopedic Surgery

## 2019-09-21 ENCOUNTER — Encounter: Payer: Self-pay | Admitting: Orthopedic Surgery

## 2019-09-21 VITALS — BP 140/92 | HR 91 | Ht 62.0 in | Wt 176.0 lb

## 2019-09-21 DIAGNOSIS — M1711 Unilateral primary osteoarthritis, right knee: Secondary | ICD-10-CM

## 2019-09-21 DIAGNOSIS — Z9889 Other specified postprocedural states: Secondary | ICD-10-CM

## 2019-09-21 DIAGNOSIS — G8929 Other chronic pain: Secondary | ICD-10-CM | POA: Diagnosis not present

## 2019-09-21 DIAGNOSIS — M25561 Pain in right knee: Secondary | ICD-10-CM | POA: Diagnosis not present

## 2019-09-21 DIAGNOSIS — M1710 Unilateral primary osteoarthritis, unspecified knee: Secondary | ICD-10-CM

## 2019-09-21 NOTE — Patient Instructions (Signed)

## 2019-09-21 NOTE — Progress Notes (Signed)
Chief Complaint  Patient presents with  . Knee Pain    R/it has good days and bad days   Zoe Bailey is returning after injection right knee status post knee arthroscopy where they found arthritis along with her meniscal tear unfortunately was on the same side she was continued having pain and catching sensation which still continues however on Voltaren tramadol and with the injection she now has good and bad days  A lot of her symptoms come from mowing her lawn.  She has a regular lawnmower and mows a 3 acre lot  The knee actually looks good today there is no swelling the joint is nontender to touch there is no effusion  She has good range of motion knee looks stable and feel stable  We discussed possible treatment options including continuing her Voltaren and tramadol and alternating injections with hyaluronic acid and cortisone  On the next injection in 3 months if she needs it I would definitely consider Orthovisc or Monovisc.  She considered the Orthovisc a much better choice for her even though it requires 3 injections  Impression  Encounter Diagnoses  Name Primary?  . Chronic pain of right knee Yes  . S/P right knee arthroscopy   . Primary localized osteoarthritis of knee

## 2019-10-02 MED FILL — traMADol HCL 50 MG TABS: 50 | 30 days supply | Qty: 120 | Fill #1

## 2019-10-02 MED FILL — DULoxetine HCL 60 MG CPEP: 60 | 30 days supply | Qty: 30 | Fill #1

## 2019-10-05 ENCOUNTER — Other Ambulatory Visit: Payer: Self-pay | Admitting: Family Medicine

## 2019-10-05 ENCOUNTER — Encounter: Payer: Self-pay | Admitting: Family Medicine

## 2019-10-05 ENCOUNTER — Ambulatory Visit (INDEPENDENT_AMBULATORY_CARE_PROVIDER_SITE_OTHER): Payer: 59 | Admitting: Family Medicine

## 2019-10-05 ENCOUNTER — Other Ambulatory Visit: Payer: Self-pay

## 2019-10-05 VITALS — BP 140/90 | HR 98 | Resp 16 | Ht 62.0 in | Wt 176.1 lb

## 2019-10-05 DIAGNOSIS — K219 Gastro-esophageal reflux disease without esophagitis: Secondary | ICD-10-CM

## 2019-10-05 DIAGNOSIS — F418 Other specified anxiety disorders: Secondary | ICD-10-CM

## 2019-10-05 DIAGNOSIS — R7303 Prediabetes: Secondary | ICD-10-CM | POA: Diagnosis not present

## 2019-10-05 DIAGNOSIS — E559 Vitamin D deficiency, unspecified: Secondary | ICD-10-CM

## 2019-10-05 DIAGNOSIS — F17200 Nicotine dependence, unspecified, uncomplicated: Secondary | ICD-10-CM | POA: Diagnosis not present

## 2019-10-05 DIAGNOSIS — G43119 Migraine with aura, intractable, without status migrainosus: Secondary | ICD-10-CM

## 2019-10-05 DIAGNOSIS — Z13220 Encounter for screening for lipoid disorders: Secondary | ICD-10-CM | POA: Diagnosis not present

## 2019-10-05 DIAGNOSIS — Z1159 Encounter for screening for other viral diseases: Secondary | ICD-10-CM | POA: Diagnosis not present

## 2019-10-05 DIAGNOSIS — F411 Generalized anxiety disorder: Secondary | ICD-10-CM | POA: Diagnosis not present

## 2019-10-05 DIAGNOSIS — I10 Essential (primary) hypertension: Secondary | ICD-10-CM

## 2019-10-05 DIAGNOSIS — E669 Obesity, unspecified: Secondary | ICD-10-CM

## 2019-10-05 LAB — POCT GLYCOSYLATED HEMOGLOBIN (HGB A1C)
HbA1c POC (<> result, manual entry): 5.5 % (ref 4.0–5.6)
HbA1c, POC (controlled diabetic range): 5.5 % (ref 0.0–7.0)
HbA1c, POC (prediabetic range): 5.5 % — AB (ref 5.7–6.4)
Hemoglobin A1C: 5.5 % (ref 4.0–5.6)

## 2019-10-05 MED ORDER — AMLODIPINE BESYLATE 5 MG PO TABS: 5.0000 mg | ORAL_TABLET | Freq: Every day | ORAL | 3 refills | Status: DC

## 2019-10-05 MED ORDER — NICOTINE 7 MG/24HR TD PT24: 7.0000 mg | MEDICATED_PATCH | Freq: Every day | TRANSDERMAL | 1 refills | Status: DC

## 2019-10-05 MED FILL — NICOTINE 7 MG/24HR PATCH: 7 | 28 days supply | Qty: 28 | Fill #0

## 2019-10-05 MED FILL — AMLODIPINE BESYLATE 5 MG TA: 5 | 90 days supply | Qty: 90 | Fill #0

## 2019-10-05 NOTE — Patient Instructions (Signed)
Annual physical exam in 4 weeks with MD re  eval blood pressure  Start nicoderm patch and stop smoking , now at 7/ day  New addition for blood pressure is amlodipine 5 mg once daily  Glycohb in office today  Lab today cBC, lipid, cmpand EGFr, TSH, Vit D and hepattis C screen   It is important that you exercise regularly at least 30 minutes 5 times a week. If you develop chest pain, have severe difficulty breathing, or feel very tired, stop exercising immediately and seek medical attention   Think about what you will eat, plan ahead. Choose " clean, green, fresh or frozen" over canned, processed or packaged foods which are more sugary, salty and fatty. 70 to 75% of food eaten should be vegetables and fruit. Three meals at set times with snacks allowed between meals, but they must be fruit or vegetables. Aim to eat over a 12 hour period , example 7 am to 7 pm, and STOP after  your last meal of the day. Drink water,generally about 64 ounces per day, no other drink is as healthy. Fruit juice is best enjoyed in a healthy way, by EATING the fruit.

## 2019-10-07 ENCOUNTER — Encounter: Payer: Self-pay | Admitting: Family Medicine

## 2019-10-07 DIAGNOSIS — R7303 Prediabetes: Secondary | ICD-10-CM | POA: Insufficient documentation

## 2019-10-07 NOTE — Assessment & Plan Note (Signed)
Asked:confirms currently smokes cigarettes 7/day Assess: willing to quit and  cutting back Advise: needs to QUIT to reduce risk of cancer, cardio and cerebrovascular disease Assist: counseled for 5 minutes and literature provided Arrange: follow up in 3 months nicoderm prescribed

## 2019-10-07 NOTE — Assessment & Plan Note (Signed)
Controlled, no change in medication  

## 2019-10-07 NOTE — Progress Notes (Signed)
Zoe Bailey     MRN: [id number]      DOB: 08/16/1965   HPI Zoe Bailey is here for follow up and re-evaluation of chronic medical conditions, medication management and review of any available recent lab and radiology data.  Preventive health is updated, specifically  Cancer screening and Immunization.   Questions or concerns regarding consultations or procedures which the PT has had in the interim are  addressed. The PT denies any adverse reactions to current medications since the last visit.  There are no new concerns.  There are no specific complaints   ROS Denies recent fever or chills. Denies sinus pressure, nasal congestion, ear pain or sore throat. Denies chest congestion, productive cough or wheezing. Denies chest pains, palpitations and leg swelling Denies abdominal pain, nausea, vomiting,diarrhea or constipation.   Denies dysuria, frequency, hesitancy or incontinence. C/o generalized  joint pain, swelling and limitation in mobility. Denies headaches, seizures, numbness, or tingling. Denies uncontrolled  depression, anxiety or insomnia. Denies skin break down or rash.   PE  BP (!) 140/90   Pulse 98   Resp 16   Ht 5\' 2"  (1.575 m)   Wt 176 lb 1.9 oz (79.9 kg)   LMP 08/21/2017 (Approximate) Comment: preg. test negative  BMI 32.21 kg/m   Patient alert and oriented and in no cardiopulmonary distress.  HEENT: No facial asymmetry, EOMI,     Neck supple .  Chest: Clear to auscultation bilaterally.  CVS: S1, S2 no murmurs, no S3.Regular rate.  ABD: Soft non tender.   Ext: No edema  MS: Adequate ROM spine, shoulders, hips and knees.  Skin: Intact, no ulcerations or rash noted.  Psych: Good eye contact, normal affect. Memory intact not anxious or depressed appearing.  CNS: CN 2-12 intact, power,  normal throughout.no focal deficits noted.   Assessment & Plan  Essential hypertension Uncontrolled , add amlodipine and re eval DASH diet and commitment to daily  physical activity for a minimum of 30 minutes discussed and encouraged, as a part of hypertension management. The importance of attaining a healthy weight is also discussed.  BP/Weight 10/05/2019 09/21/2019 08/10/2019 07/05/2019 06/20/2019 04/10/2019 22/29/7989  Systolic BP 211 941 740 814 481 - 856  Diastolic BP 90 92 314 91 90 - 98  Wt. (Lbs) 176.12 176 171.38 174 176 168 168  BMI 32.21 32.19 31.34 31.83 32.19 30.73 30.73       GAD (generalized anxiety disorder) Controlled, no change in medication   Depression with anxiety Controlled, no change in medication   NICOTINE ADDICTION Asked:confirms currently smokes cigarettes 7/day Assess: willing to quit and  cutting back Advise: needs to QUIT to reduce risk of cancer, cardio and cerebrovascular disease Assist: counseled for 5 minutes and literature provided Arrange: follow up in 3 months nicoderm prescribed   Migraine Controlled, no change in medication   GERD (gastroesophageal reflux disease) Controlled, no change in medication   Prediabetes Patient educated about the importance of limiting  Carbohydrate intake , the need to commit to daily physical activity for a minimum of 30 minutes , and to commit weight loss. The fact that changes in all these areas will reduce or eliminate all together the development of diabetes is stressed.   Diabetic Labs Latest Ref Rng & Units 10/05/2019 10/05/2019 10/05/2019 10/05/2019 09/16/2018  HbA1c 4.0 - 5.6 % 5.5(A) 5.5 5.5 5.5 6.0(H)  Chol 0 - 200 mg/dL - - - - 202(H)  HDL >40 mg/dL - - - - 62  Calc LDL 0 - 99 mg/dL - - - - 126(H)  Triglycerides <150 mg/dL - - - - 68  Creatinine 0.44 - 1.00 mg/dL - - - - 0.72   BP/Weight 10/05/2019 09/21/2019 08/10/2019 07/05/2019 06/20/2019 04/10/2019 15/37/9432  Systolic BP 761 470 929 574 734 - 037  Diastolic BP 90 92 096 91 90 - 98  Wt. (Lbs) 176.12 176 171.38 174 176 168 168  BMI 32.21 32.19 31.34 31.83 32.19 30.73 30.73   No flowsheet data found.  Improved  and currently corrected, which is great!  Obesity (BMI 30.0-34.9)  Patient re-educated about  the importance of commitment to a  minimum of 150 minutes of exercise per week as able.  The importance of healthy food choices with portion control discussed, as well as eating regularly and within a 12 hour window most days. The need to choose "clean , green" food 50 to 75% of the time is discussed, as well as to make water the primary drink and set a goal of 64 ounces water daily.    Weight /BMI 10/05/2019 09/21/2019 08/10/2019  WEIGHT 176 lb 1.9 oz 176 lb 171 lb 6 oz  HEIGHT 5\' 2"  5\' 2"  5\' 2"   BMI 32.21 kg/m2 32.19 kg/m2 31.34 kg/m2

## 2019-10-07 NOTE — Assessment & Plan Note (Signed)
  Patient re-educated about  the importance of commitment to a  minimum of 150 minutes of exercise per week as able.  The importance of healthy food choices with portion control discussed, as well as eating regularly and within a 12 hour window most days. The need to choose "clean , green" food 50 to 75% of the time is discussed, as well as to make water the primary drink and set a goal of 64 ounces water daily.    Weight /BMI 10/05/2019 09/21/2019 08/10/2019  WEIGHT 176 lb 1.9 oz 176 lb 171 lb 6 oz  HEIGHT 5\' 2"  5\' 2"  5\' 2"   BMI 32.21 kg/m2 32.19 kg/m2 31.34 kg/m2

## 2019-10-07 NOTE — Assessment & Plan Note (Signed)
Uncontrolled , add amlodipine and re eval DASH diet and commitment to daily physical activity for a minimum of 30 minutes discussed and encouraged, as a part of hypertension management. The importance of attaining a healthy weight is also discussed.  BP/Weight 10/05/2019 09/21/2019 08/10/2019 07/05/2019 06/20/2019 04/10/2019 37/94/3276  Systolic BP 147 092 957 473 403 - 709  Diastolic BP 90 92 643 91 90 - 98  Wt. (Lbs) 176.12 176 171.38 174 176 168 168  BMI 32.21 32.19 31.34 31.83 32.19 30.73 30.73

## 2019-10-07 NOTE — Assessment & Plan Note (Signed)
Patient educated about the importance of limiting  Carbohydrate intake , the need to commit to daily physical activity for a minimum of 30 minutes , and to commit weight loss. The fact that changes in all these areas will reduce or eliminate all together the development of diabetes is stressed.   Diabetic Labs Latest Ref Rng & Units 10/05/2019 10/05/2019 10/05/2019 10/05/2019 09/16/2018  HbA1c 4.0 - 5.6 % 5.5(A) 5.5 5.5 5.5 6.0(H)  Chol 0 - 200 mg/dL - - - - 202(H)  HDL >40 mg/dL - - - - 62  Calc LDL 0 - 99 mg/dL - - - - 126(H)  Triglycerides <150 mg/dL - - - - 68  Creatinine 0.44 - 1.00 mg/dL - - - - 0.72   BP/Weight 10/05/2019 09/21/2019 08/10/2019 07/05/2019 06/20/2019 04/10/2019 09/38/1829  Systolic BP 937 169 678 938 101 - 751  Diastolic BP 90 92 025 91 90 - 98  Wt. (Lbs) 176.12 176 171.38 174 176 168 168  BMI 32.21 32.19 31.34 31.83 32.19 30.73 30.73   No flowsheet data found.  Improved and currently corrected, which is great!

## 2019-10-11 ENCOUNTER — Other Ambulatory Visit: Payer: Self-pay | Admitting: Family Medicine

## 2019-10-12 DIAGNOSIS — M542 Cervicalgia: Secondary | ICD-10-CM | POA: Diagnosis not present

## 2019-10-12 DIAGNOSIS — M791 Myalgia, unspecified site: Secondary | ICD-10-CM | POA: Diagnosis not present

## 2019-10-12 DIAGNOSIS — G43719 Chronic migraine without aura, intractable, without status migrainosus: Secondary | ICD-10-CM | POA: Diagnosis not present

## 2019-10-12 MED FILL — CYCLOBENZAPRINE HCL 10 MG T: 10 | 10 days supply | Qty: 30 | Fill #0

## 2019-10-16 MED FILL — busPIRone HCL 10 MG TABS: 10 | 30 days supply | Qty: 90 | Fill #2

## 2019-10-16 MED FILL — ESOMEPRAZOLE MAG DR 40 MG C: 40 | 30 days supply | Qty: 30 | Fill #1

## 2019-10-16 MED FILL — DICLOFENAC SOD EC 75 MG TAB: 75 | 30 days supply | Qty: 30 | Fill #2

## 2019-10-24 DIAGNOSIS — M25569 Pain in unspecified knee: Secondary | ICD-10-CM | POA: Diagnosis not present

## 2019-10-24 DIAGNOSIS — M79672 Pain in left foot: Secondary | ICD-10-CM | POA: Diagnosis not present

## 2019-10-24 DIAGNOSIS — M25539 Pain in unspecified wrist: Secondary | ICD-10-CM | POA: Diagnosis not present

## 2019-10-24 DIAGNOSIS — Z79891 Long term (current) use of opiate analgesic: Secondary | ICD-10-CM | POA: Diagnosis not present

## 2019-10-24 DIAGNOSIS — M25579 Pain in unspecified ankle and joints of unspecified foot: Secondary | ICD-10-CM | POA: Diagnosis not present

## 2019-10-24 DIAGNOSIS — M545 Low back pain: Secondary | ICD-10-CM | POA: Diagnosis not present

## 2019-10-24 DIAGNOSIS — G8929 Other chronic pain: Secondary | ICD-10-CM | POA: Diagnosis not present

## 2019-10-24 MED FILL — HYDROCODON-APAP 5-325: 5-325 | 28 days supply | Qty: 55 | Fill #0

## 2019-10-25 ENCOUNTER — Other Ambulatory Visit (HOSPITAL_COMMUNITY): Payer: Self-pay | Admitting: Neurology

## 2019-10-26 MED FILL — DULoxetine HCL 60 MG CPEP: 60 | 30 days supply | Qty: 30 | Fill #0

## 2019-11-07 MED FILL — CYCLOBENZAPRINE HCL 10 MG T: 10 | 10 days supply | Qty: 30 | Fill #1

## 2019-11-09 ENCOUNTER — Ambulatory Visit: Payer: Self-pay

## 2019-11-10 ENCOUNTER — Other Ambulatory Visit: Payer: Self-pay | Admitting: Family Medicine

## 2019-11-10 MED FILL — TRIAMTERENE-HCTZ 75-50 MG T: 75-50 | 90 days supply | Qty: 90 | Fill #0

## 2019-11-16 ENCOUNTER — Other Ambulatory Visit: Payer: Self-pay | Admitting: Family Medicine

## 2019-11-16 MED FILL — DICLOFENAC SOD EC 75 MG TAB: 75 | 30 days supply | Qty: 30 | Fill #0

## 2019-11-16 MED FILL — ESOMEPRAZOLE MAG DR 40 MG C: 40 | 30 days supply | Qty: 30 | Fill #2

## 2019-11-22 DIAGNOSIS — M25539 Pain in unspecified wrist: Secondary | ICD-10-CM | POA: Diagnosis not present

## 2019-11-22 DIAGNOSIS — Z79891 Long term (current) use of opiate analgesic: Secondary | ICD-10-CM | POA: Diagnosis not present

## 2019-11-22 DIAGNOSIS — G8929 Other chronic pain: Secondary | ICD-10-CM | POA: Diagnosis not present

## 2019-11-22 DIAGNOSIS — M545 Low back pain: Secondary | ICD-10-CM | POA: Diagnosis not present

## 2019-11-22 DIAGNOSIS — M79672 Pain in left foot: Secondary | ICD-10-CM | POA: Diagnosis not present

## 2019-11-22 DIAGNOSIS — M25579 Pain in unspecified ankle and joints of unspecified foot: Secondary | ICD-10-CM | POA: Diagnosis not present

## 2019-11-22 DIAGNOSIS — M25569 Pain in unspecified knee: Secondary | ICD-10-CM | POA: Diagnosis not present

## 2019-11-22 MED FILL — CYCLOBENZAPRINE HCL 10 MG T: 10 | 10 days supply | Qty: 30 | Fill #2

## 2019-11-22 MED FILL — predniSONE 10 MG TABS: 10 | 12 days supply | Qty: 21 | Fill #0

## 2019-11-22 MED FILL — HYDROCODON-APAP 7.5-325: 7.5-325 | 30 days supply | Qty: 60 | Fill #0

## 2019-11-23 MED FILL — BOTOX 100 UNITS VIAL: 100 | 84 days supply | Qty: 2 | Fill #1

## 2019-11-30 DIAGNOSIS — M791 Myalgia, unspecified site: Secondary | ICD-10-CM | POA: Diagnosis not present

## 2019-11-30 DIAGNOSIS — G43719 Chronic migraine without aura, intractable, without status migrainosus: Secondary | ICD-10-CM | POA: Diagnosis not present

## 2019-11-30 DIAGNOSIS — M542 Cervicalgia: Secondary | ICD-10-CM | POA: Diagnosis not present

## 2019-12-05 MED FILL — GABAPENTIN 400 MG CAPSULE: 400 | 90 days supply | Qty: 180 | Fill #1

## 2019-12-15 ENCOUNTER — Encounter: Payer: Self-pay | Admitting: Orthopedic Surgery

## 2019-12-15 ENCOUNTER — Other Ambulatory Visit: Payer: Self-pay | Admitting: Family Medicine

## 2019-12-15 MED FILL — DULoxetine HCL 60 MG CPEP: 60 | 30 days supply | Qty: 30 | Fill #1

## 2019-12-15 NOTE — Progress Notes (Signed)
Gel injection Received: Zoe Bailey  Zoe Bailey, Zoe Bailey,  Zoe Bailey [phone] appt for 12/21/19 "possible gel injection" okay?     Orthovisc Zoe approved for [facility], will submit for BV.

## 2019-12-15 NOTE — Progress Notes (Signed)
Submitted online [employer] for benefit verification.

## 2019-12-19 NOTE — Progress Notes (Signed)
Per Abigail Butts, called patient and cancelled appointment accordingly. Patient aware of status.

## 2019-12-19 NOTE — Progress Notes (Signed)
Prior Josem Kaufmann is required, she will need to be rescheduled until we can get the PA.  Can you please call and cancel for now?  Tell her we will call her to resched the appt.  Thanks.

## 2019-12-19 NOTE — Progress Notes (Signed)
Per VOB- 20% copay, buy and bill allowed, PA required, call [phone] to initiate.

## 2019-12-20 ENCOUNTER — Other Ambulatory Visit (HOSPITAL_COMMUNITY): Payer: Self-pay | Admitting: Neurology

## 2019-12-20 DIAGNOSIS — G8929 Other chronic pain: Secondary | ICD-10-CM | POA: Diagnosis not present

## 2019-12-20 DIAGNOSIS — G4700 Insomnia, unspecified: Secondary | ICD-10-CM | POA: Diagnosis not present

## 2019-12-20 DIAGNOSIS — M25569 Pain in unspecified knee: Secondary | ICD-10-CM | POA: Diagnosis not present

## 2019-12-20 DIAGNOSIS — M545 Low back pain, unspecified: Secondary | ICD-10-CM | POA: Diagnosis not present

## 2019-12-20 DIAGNOSIS — Z79891 Long term (current) use of opiate analgesic: Secondary | ICD-10-CM | POA: Diagnosis not present

## 2019-12-20 DIAGNOSIS — M25539 Pain in unspecified wrist: Secondary | ICD-10-CM | POA: Diagnosis not present

## 2019-12-20 DIAGNOSIS — M25579 Pain in unspecified ankle and joints of unspecified foot: Secondary | ICD-10-CM | POA: Diagnosis not present

## 2019-12-20 DIAGNOSIS — M79672 Pain in left foot: Secondary | ICD-10-CM | POA: Diagnosis not present

## 2019-12-20 MED FILL — HYDROCODON-APAP 10-325: 10-325 | 30 days supply | Qty: 60 | Fill #0

## 2019-12-20 MED FILL — hydrOXYzine HCL 25 MG TABS: 25 | 30 days supply | Qty: 30 | Fill #0

## 2019-12-21 ENCOUNTER — Ambulatory Visit: Payer: 59 | Admitting: Orthopedic Surgery

## 2019-12-21 MED FILL — ESOMEPRAZOLE MAG DR 40 MG C: 40 | 30 days supply | Qty: 30 | Fill #3

## 2019-12-25 ENCOUNTER — Telehealth: Payer: Self-pay | Admitting: Orthopedic Surgery

## 2019-12-25 NOTE — Telephone Encounter (Signed)
Call received from [facility] at [facility] requesting to speak with clinic staff

## 2019-12-25 NOTE — Progress Notes (Signed)
PA initiated online, pending.

## 2019-12-25 NOTE — Progress Notes (Signed)
Orthovisc is no longer preferred with [employer].  Preferred are Durolane, Gelsyn-3 and Euflexxa. No PA needed if we use a preferred drug.

## 2019-12-25 NOTE — Progress Notes (Signed)
I need to start a VOB for other drug to see what benefits are for [employer].

## 2019-12-26 NOTE — Progress Notes (Signed)
Patient's insurance preferred drugs are Euflexxa, Gelsyn-3 and Durolane (one shot).  Do you have a preference?

## 2019-12-26 NOTE — Progress Notes (Signed)
Submitted online for VOB Euflexxa.

## 2019-12-26 NOTE — Progress Notes (Signed)
Whatever they cover is fine

## 2019-12-28 ENCOUNTER — Other Ambulatory Visit: Payer: Self-pay | Admitting: Family Medicine

## 2019-12-28 MED FILL — CYCLOBENZAPRINE HCL 10 MG T: 10 | 10 days supply | Qty: 30 | Fill #0

## 2019-12-28 NOTE — Progress Notes (Signed)
$  50 copay, [id number] and [id number] covered at 100%, no auth, buy and bill are ok.

## 2019-12-29 NOTE — Progress Notes (Signed)
I tried to call patient and NA, cannot leave msg, VM full.    Patient will have a $50 copay each date of service.  Insurance should pay 100% after that.  Buy and bill ok.   If patient calls back, please advise her of the copay, and pls schedule her for 3 visits with Dr Aline Brochure, 7-10 days apart, for Zoe Bailey injections right knee.

## 2020-01-02 ENCOUNTER — Encounter: Payer: Self-pay | Admitting: Radiology

## 2020-01-02 MED FILL — AMLODIPINE BESYLATE 5 MG TA: 5 | 90 days supply | Qty: 90 | Fill #1

## 2020-01-02 MED FILL — DICLOFENAC SOD EC 75 MG TAB: 75 | 30 days supply | Qty: 30 | Fill #1

## 2020-01-02 NOTE — Progress Notes (Signed)
Mychart note sent to pt to call us to schedule.

## 2020-01-10 DIAGNOSIS — G43719 Chronic migraine without aura, intractable, without status migrainosus: Secondary | ICD-10-CM | POA: Diagnosis not present

## 2020-01-10 DIAGNOSIS — M791 Myalgia, unspecified site: Secondary | ICD-10-CM | POA: Diagnosis not present

## 2020-01-10 DIAGNOSIS — M542 Cervicalgia: Secondary | ICD-10-CM | POA: Diagnosis not present

## 2020-01-15 MED FILL — CYCLOBENZAPRINE HCL 10 MG T: 10 | 10 days supply | Qty: 30 | Fill #1

## 2020-01-15 MED FILL — busPIRone HCL 10 MG TABS: 10 | 30 days supply | Qty: 90 | Fill #3

## 2020-01-16 MED FILL — DULoxetine HCL 60 MG CPEP: 60 | 30 days supply | Qty: 30 | Fill #2

## 2020-01-19 MED FILL — HYDROCODON-APAP 10-325: 10-325 | 30 days supply | Qty: 60 | Fill #0

## 2020-01-22 MED FILL — ESOMEPRAZOLE MAG DR 40 MG C: 40 | 30 days supply | Qty: 30 | Fill #4

## 2020-01-23 DIAGNOSIS — H5202 Hypermetropia, left eye: Secondary | ICD-10-CM | POA: Diagnosis not present

## 2020-01-23 DIAGNOSIS — H2513 Age-related nuclear cataract, bilateral: Secondary | ICD-10-CM | POA: Diagnosis not present

## 2020-01-23 DIAGNOSIS — H35363 Drusen (degenerative) of macula, bilateral: Secondary | ICD-10-CM | POA: Diagnosis not present

## 2020-01-23 DIAGNOSIS — H52223 Regular astigmatism, bilateral: Secondary | ICD-10-CM | POA: Diagnosis not present

## 2020-01-23 DIAGNOSIS — H04203 Unspecified epiphora, bilateral lacrimal glands: Secondary | ICD-10-CM | POA: Diagnosis not present

## 2020-01-23 DIAGNOSIS — H524 Presbyopia: Secondary | ICD-10-CM | POA: Diagnosis not present

## 2020-02-08 ENCOUNTER — Ambulatory Visit (INDEPENDENT_AMBULATORY_CARE_PROVIDER_SITE_OTHER): Payer: 59 | Admitting: Orthopedic Surgery

## 2020-02-08 ENCOUNTER — Other Ambulatory Visit: Payer: Self-pay

## 2020-02-08 DIAGNOSIS — G8929 Other chronic pain: Secondary | ICD-10-CM

## 2020-02-08 DIAGNOSIS — M1711 Unilateral primary osteoarthritis, right knee: Secondary | ICD-10-CM | POA: Diagnosis not present

## 2020-02-08 DIAGNOSIS — M25561 Pain in right knee: Secondary | ICD-10-CM

## 2020-02-08 DIAGNOSIS — Z9889 Other specified postprocedural states: Secondary | ICD-10-CM

## 2020-02-08 NOTE — Progress Notes (Addendum)
No chief complaint on file.   Encounter Diagnoses  Name Primary?   Chronic pain of right knee Yes   S/P right knee arthroscopy    Primary osteoarthritis of right knee    Comes in for 1st EUFLEXA injection   54 year old female presents for Euflexxa injection right knee  The knee looked okay for injection however she had fluid in her joint  Through lateral approach we aspirated 30 cc of clear yellow fluid followed by injected 1 vial of Euflexxa without complication  We used ethyl chloride and alcohol to prep the knee we perform the appropriate timeout and site confirmation  Follow-up in a week for second injection

## 2020-02-12 MED FILL — DICLOFENAC SOD EC 75 MG TAB: 75 | 30 days supply | Qty: 30 | Fill #2

## 2020-02-12 MED FILL — CYCLOBENZAPRINE HCL 10 MG T: 10 | 10 days supply | Qty: 30 | Fill #2

## 2020-02-14 ENCOUNTER — Other Ambulatory Visit (HOSPITAL_COMMUNITY): Payer: Self-pay | Admitting: Neurology

## 2020-02-14 DIAGNOSIS — G8929 Other chronic pain: Secondary | ICD-10-CM | POA: Diagnosis not present

## 2020-02-14 DIAGNOSIS — M25579 Pain in unspecified ankle and joints of unspecified foot: Secondary | ICD-10-CM | POA: Diagnosis not present

## 2020-02-14 DIAGNOSIS — Z79891 Long term (current) use of opiate analgesic: Secondary | ICD-10-CM | POA: Diagnosis not present

## 2020-02-14 DIAGNOSIS — M25539 Pain in unspecified wrist: Secondary | ICD-10-CM | POA: Diagnosis not present

## 2020-02-14 DIAGNOSIS — M545 Low back pain, unspecified: Secondary | ICD-10-CM | POA: Diagnosis not present

## 2020-02-14 DIAGNOSIS — M25569 Pain in unspecified knee: Secondary | ICD-10-CM | POA: Diagnosis not present

## 2020-02-14 DIAGNOSIS — M79672 Pain in left foot: Secondary | ICD-10-CM | POA: Diagnosis not present

## 2020-02-14 DIAGNOSIS — G4700 Insomnia, unspecified: Secondary | ICD-10-CM | POA: Diagnosis not present

## 2020-02-14 MED FILL — DULoxetine HCL 60 MG CPEP: 60 | 90 days supply | Qty: 90 | Fill #0

## 2020-02-14 MED FILL — hydrOXYzine HCL 25 MG TABS: 25 | 30 days supply | Qty: 30 | Fill #0

## 2020-02-19 ENCOUNTER — Other Ambulatory Visit: Payer: Self-pay

## 2020-02-19 ENCOUNTER — Ambulatory Visit (INDEPENDENT_AMBULATORY_CARE_PROVIDER_SITE_OTHER): Payer: 59 | Admitting: Orthopedic Surgery

## 2020-02-19 ENCOUNTER — Encounter: Payer: Self-pay | Admitting: Orthopedic Surgery

## 2020-02-19 DIAGNOSIS — M25561 Pain in right knee: Secondary | ICD-10-CM

## 2020-02-19 DIAGNOSIS — M1711 Unilateral primary osteoarthritis, right knee: Secondary | ICD-10-CM | POA: Diagnosis not present

## 2020-02-19 MED FILL — HYDROCODON-APAP 10-325: 10-325 | 30 days supply | Qty: 60 | Fill #0

## 2020-02-19 NOTE — Progress Notes (Signed)
Chief Complaint  Patient presents with  . Injections    Right knee Euflexxa #2 lot [id number]   54 year old female presents for Euflexxa injection right knee  The knee looked okay for injection  We used ethyl chloride and alcohol to prep the knee we perform the appropriate timeout and site confirmation euflexa injection right knee  Follow-up in a week for inj #3

## 2020-02-22 ENCOUNTER — Other Ambulatory Visit: Payer: Self-pay

## 2020-02-22 ENCOUNTER — Ambulatory Visit: Payer: 59

## 2020-02-22 ENCOUNTER — Encounter: Payer: Self-pay | Admitting: Orthopedic Surgery

## 2020-02-22 ENCOUNTER — Ambulatory Visit (INDEPENDENT_AMBULATORY_CARE_PROVIDER_SITE_OTHER): Payer: 59 | Admitting: Orthopedic Surgery

## 2020-02-22 VITALS — BP 166/103 | HR 120 | Ht 62.0 in | Wt 170.0 lb

## 2020-02-22 DIAGNOSIS — M19131 Post-traumatic osteoarthritis, right wrist: Secondary | ICD-10-CM

## 2020-02-22 DIAGNOSIS — M25531 Pain in right wrist: Secondary | ICD-10-CM | POA: Diagnosis not present

## 2020-02-22 MED FILL — BOTOX 100 UNITS VIAL: 100 | 84 days supply | Qty: 2 | Fill #2

## 2020-02-22 NOTE — Progress Notes (Addendum)
NEW PROBLEM//OFFICE VISIT  Chief Complaint  Patient presents with  . Wrist Pain    Right wrist, carpel tunnel surgery 2000, better for a couple years, had a fall recently and fell and hurt her wrist.     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  She says she has had multiple falls over the years with multiple wrist injuries she is presenting now for possible treatment options   ROS Chronic wrist pain chronic pain management with Dr. Merlene Laughter for the wrist with hydrocodone  Joint pain currently in treatment for hyaluronic acid injection  Past Medical History:  Diagnosis Date  . Arthritis   . Cancer ([place]) 2010 approx   skin cancer  . Complication of anesthesia    Wakes up during all surgeries  . COPD (chronic obstructive pulmonary disease) ([place])   . Depression   . Foot pain, right 03/2014  . Migraine    USES BOTOX FOR TREATMENT   . Shortness of breath dyspnea   . Sleep apnea   . Toenail fungus     Past Surgical History:  Procedure Laterality Date  . ANKLE FRACTURE SURGERY Right   . BREAST ENHANCEMENT SURGERY    . CARPAL TUNNEL RELEASE Right    x2  . CHONDROPLASTY Right 02/17/2018   Procedure: CHONDROPLASTY;  Surgeon: Paralee Cancel, MD;  Location: [facility];  Service: Orthopedics;  Laterality: Right;  . COLONOSCOPY WITH PROPOFOL N/A 05/07/2017   Procedure: COLONOSCOPY WITH PROPOFOL;  Surgeon: Rogene Houston, MD;  [facility];  Service: Endoscopy;  Laterality: N/A;  8:30  . ESOPHAGOGASTRODUODENOSCOPY (EGD) WITH PROPOFOL N/A 03/06/2019   Procedure: ESOPHAGOGASTRODUODENOSCOPY (EGD) WITH PROPOFOL;  Surgeon: Rogene Houston, MD;  [facility];  Service: Endoscopy;  Laterality:  N/A;  . HERNIA REPAIR     umbilical  . KNEE ARTHROSCOPY WITH MEDIAL MENISECTOMY Right 02/17/2018   Procedure: KNEE ARTHROSCOPY WITH MEDIAL MENISECTOMY;  Surgeon: Paralee Cancel, MD;  Location: [facility];  Service: Orthopedics;  Laterality: Right;  . MALONEY DILATION  03/06/2019   Procedure: MALONEY DILATION;  Surgeon: Rogene Houston, MD;  [facility];  Service: Endoscopy;;  . NASAL POLYP SURGERY    . POLYPECTOMY      Family History  Problem Relation Age of Onset  . Arthritis Other   . Cancer Other    Social History   Tobacco Use  . Smoking status: Current Every Day Smoker    Packs/day: 0.25    Years: 33.00    Pack years: 8.25  . Smokeless tobacco: Never Used  Vaping Use  . Vaping Use: Never used  Substance Use Topics  . Alcohol use: No    Alcohol/week: 0.0 standard drinks  . Drug use: No    Allergies  Allergen Reactions  . Effexor [Venlafaxine] Other (See Comments)    Decreased libido  . Other Other (See Comments)    Pt has a fear of tourniquets    Current Meds  Medication Sig  . amLODipine (NORVASC) 5 MG tablet Take 1 tablet (5 mg total) by mouth daily.  . busPIRone (BUSPAR) 10 MG tablet TAKE 1 TABLET BY MOUTH 3 TIMES DAILY.  . cyclobenzaprine (FLEXERIL) 10 MG tablet TAKE 1  TABLET BY MOUTH 3 TIMES DAILY AS NEEDED FOR MUSCLE SPASMS.  Marland Kitchen diclofenac (VOLTAREN) 75 MG EC tablet TAKE 1 TABLET BY MOUTH ONCE DAILY AS NEEDED FOR PAIN  . DULoxetine (CYMBALTA) 20 MG capsule Take 60 mg by mouth daily.   Marland Kitchen esomeprazole (NEXIUM) 40 MG capsule TAKE 1 CAPSULE (40 MG TOTAL) BY MOUTH DAILY BEFORE BREAKFAST.  Marland Kitchen gabapentin (NEURONTIN) 400 MG capsule TAKE 2 CAPSULES BY MOUTH EVERY MORNING FOR MIGRAINE PREVENTION  . HYDROcodone-acetaminophen (NORCO) 10-325 MG tablet Take 1 tablet by mouth 2 (two) times daily as needed.  . Multiple Vitamin (MULTIVITAMIN WITH MINERALS) TABS tablet Take 2 tablets by mouth daily.   . nicotine (NICODERM CQ - DOSED IN MG/24 HR) 7 mg/24hr patch Place  1 patch (7 mg total) onto the skin daily.  . OnabotulinumtoxinA (BOTOX IM) Inject 1 Dose into the muscle every 3 (three) months.  . triamterene-hydrochlorothiazide (MAXZIDE) 75-50 MG tablet TAKE 1 TABLET BY MOUTH DAILY.  Marland Kitchen UNABLE TO FIND Blood pressure monitor x 1  DX I10    BP (!) 166/103   Pulse (!) 120   Ht 5\' 2"  (1.575 m)   Wt 170 lb (77.1 kg)   LMP 08/21/2017 (Approximate) Comment: preg. test negative  BMI 31.09 kg/m   Physical Exam General appearance normal grooming and hygiene  Oriented x3 normal  Mood affect normal   Ortho Exam  Gait noncontributory  Examination of the wrist, right Inspection swelling and tenderness over the wrist joint Range of motion Extension and flexion still has reasonable motion Stability normal Strength weak grip strength Skin no lesions Pulse and perfusion normal Extremity lymph nodes none palpable Sensation normal  MEDICAL DECISION MAKING  A.  Encounter Diagnoses  Name Primary?  . Pain in right wrist Yes  . Post-traumatic osteoarthritis, right wrist     B. DATA ANALYSED:   IMAGING: Interpretation of images: Office x-rays today show radial scaphoid complete loss of joint space surrounding osteophytes fragments of bone tilting of the lunate with some lunate cartilage still preserved  Orders: none  Outside records reviewed: no   C. MANAGEMENT   Patient will need surgical correction with fusion  Patient referred back to hand for definitive management  No orders of the defined types were placed in this encounter.     Arther Abbott, MD  02/22/2020 9:28 AM

## 2020-02-23 MED FILL — ESOMEPRAZOLE MAG DR 40 MG C: 40 | 30 days supply | Qty: 30 | Fill #5

## 2020-02-26 ENCOUNTER — Ambulatory Visit (INDEPENDENT_AMBULATORY_CARE_PROVIDER_SITE_OTHER): Payer: 59 | Admitting: Orthopedic Surgery

## 2020-02-26 ENCOUNTER — Encounter: Payer: Self-pay | Admitting: Orthopedic Surgery

## 2020-02-26 ENCOUNTER — Other Ambulatory Visit: Payer: Self-pay | Admitting: Family Medicine

## 2020-02-26 ENCOUNTER — Other Ambulatory Visit: Payer: Self-pay

## 2020-02-26 DIAGNOSIS — M1711 Unilateral primary osteoarthritis, right knee: Secondary | ICD-10-CM

## 2020-02-26 DIAGNOSIS — M25561 Pain in right knee: Secondary | ICD-10-CM

## 2020-02-26 MED FILL — TRIAMTERENE-HCTZ 75-50 MG T: 75-50 | 90 days supply | Qty: 90 | Fill #1

## 2020-02-26 NOTE — Progress Notes (Signed)
Chief Complaint  Patient presents with  . Injections    Right knee Euflexxa #3   Time out to confirm right knee  Verbal consent obtained   1 vial of euflexxa injected into the right knee  Encounter Diagnoses  Name Primary?  . Chronic pain of right knee Yes  . Primary osteoarthritis of right knee

## 2020-02-27 ENCOUNTER — Other Ambulatory Visit: Payer: Self-pay | Admitting: Family Medicine

## 2020-02-27 MED FILL — CYCLOBENZAPRINE HCL 10 MG T: 10 | 10 days supply | Qty: 30 | Fill #0

## 2020-02-29 DIAGNOSIS — M791 Myalgia, unspecified site: Secondary | ICD-10-CM | POA: Diagnosis not present

## 2020-02-29 DIAGNOSIS — G43719 Chronic migraine without aura, intractable, without status migrainosus: Secondary | ICD-10-CM | POA: Diagnosis not present

## 2020-02-29 DIAGNOSIS — M542 Cervicalgia: Secondary | ICD-10-CM | POA: Diagnosis not present

## 2020-03-08 MED FILL — GABAPENTIN 400 MG CAPSULE: 400 | 90 days supply | Qty: 180 | Fill #2

## 2020-03-11 ENCOUNTER — Ambulatory Visit: Payer: 59

## 2020-03-11 ENCOUNTER — Encounter: Payer: Self-pay | Admitting: Orthopedic Surgery

## 2020-03-11 ENCOUNTER — Ambulatory Visit: Payer: 59 | Admitting: Orthopedic Surgery

## 2020-03-11 ENCOUNTER — Other Ambulatory Visit: Payer: Self-pay

## 2020-03-11 VITALS — BP 134/92 | HR 92 | Ht 62.0 in | Wt 176.0 lb

## 2020-03-11 DIAGNOSIS — M25531 Pain in right wrist: Secondary | ICD-10-CM

## 2020-03-11 DIAGNOSIS — M79642 Pain in left hand: Secondary | ICD-10-CM

## 2020-03-11 DIAGNOSIS — M25532 Pain in left wrist: Secondary | ICD-10-CM

## 2020-03-11 NOTE — Progress Notes (Signed)
NEW PROBLEM//OFFICE VISIT  Summary assessment and plan:   SLAC TYPE WRIST   REFER TO HAND SURGERY     Chief Complaint  Patient presents with  . Hand Pain    S/p CTR 9 years ago has knot and pain    55 year old female progressive pain left wrist history of slack type wrist on the right recently referred to [facility] to take a look  Complains of pain over the dorsum of the right wrist and painful wrist flexion despite bracing and anti-inflammatories and arthritis medications  She is a smoker not a diabetic has hypertension  Says she fell on December 3 of 2021 but has had some pain before that   Review of Systems  Respiratory: Negative for shortness of breath.   Cardiovascular: Negative for chest pain.  Musculoskeletal:       Multiple joint pain  Right wrist slack wrist left wrist pain  Multiple joint arthritis  Right knee recent hyaluronic acid injections with good result  Skin: Negative for itching.       Patient has subcutaneous lesions from the medication she is taking relates it to that       Past Medical History:  Diagnosis Date  . Arthritis   . Cancer ([facility]) 2010 approx   skin cancer  . Complication of anesthesia    Wakes up during all surgeries  . COPD (chronic obstructive pulmonary disease) ([facility])   . Depression   . Foot pain, right 03/2014  . Migraine    USES BOTOX FOR TREATMENT   . Shortness of breath dyspnea   . Sleep apnea   . Toenail fungus     Past Surgical History:  Procedure Laterality Date  . ANKLE FRACTURE SURGERY Right   . BREAST ENHANCEMENT SURGERY    . CARPAL TUNNEL RELEASE Right    x2  . CHONDROPLASTY Right 02/17/2018   Procedure: CHONDROPLASTY;  Surgeon: Durene Romans, MD;  [facility];  Service: Orthopedics;  Laterality: Right;  . COLONOSCOPY WITH PROPOFOL N/A 05/07/2017   Procedure: COLONOSCOPY WITH PROPOFOL;  Surgeon: Malissa Hippo, MD;  [facility];  Service: Endoscopy;  Laterality: N/A;   8:30  . ESOPHAGOGASTRODUODENOSCOPY (EGD) WITH PROPOFOL N/A 03/06/2019   Procedure: ESOPHAGOGASTRODUODENOSCOPY (EGD) WITH PROPOFOL;  Surgeon: Malissa Hippo, MD;  [facility];  Service: Endoscopy;  Laterality: N/A;  . HERNIA REPAIR     umbilical  . KNEE ARTHROSCOPY WITH MEDIAL MENISECTOMY Right 02/17/2018   Procedure: KNEE ARTHROSCOPY WITH MEDIAL MENISECTOMY;  Surgeon: Durene Romans, MD;  [facility];  Service: Orthopedics;  Laterality: Right;  . MALONEY DILATION  03/06/2019   Procedure: MALONEY DILATION;  Surgeon: Malissa Hippo, MD;  [facility];  Service: Endoscopy;;  . NASAL POLYP SURGERY    . POLYPECTOMY      Family History  Problem Relation Age of Onset  . Arthritis Other   . Cancer Other    Social History   Tobacco Use  . Smoking status: Current Every Day Smoker    Packs/day: 0.25    Years: 33.00    Pack years: 8.25  . Smokeless tobacco: Never Used  Vaping Use  . Vaping Use: Never used  Substance Use Topics  . Alcohol use: No    Alcohol/week: 0.0 standard drinks  . Drug use: No    Allergies  Allergen Reactions  . Effexor [Venlafaxine] Other (See Comments)    Decreased libido  . Other Other (See Comments)  Pt has a fear of tourniquets    Current Meds  Medication Sig  . amLODipine (NORVASC) 5 MG tablet Take 1 tablet (5 mg total) by mouth daily.  Marland Kitchen BOTOX 100 units SOLR injection Inject into the muscle.  . busPIRone (BUSPAR) 10 MG tablet TAKE 1 TABLET BY MOUTH 3 TIMES DAILY.  . cyclobenzaprine (FLEXERIL) 10 MG tablet TAKE 1 TABLET BY MOUTH 3 TIMES DAILY AS NEEDED FOR MUSCLE SPASMS.  Marland Kitchen diclofenac (VOLTAREN) 75 MG EC tablet TAKE 1 TABLET BY MOUTH ONCE DAILY AS NEEDED FOR PAIN  . DULoxetine (CYMBALTA) 20 MG capsule Take 60 mg by mouth daily.   Marland Kitchen esomeprazole (NEXIUM) 40 MG capsule TAKE 1 CAPSULE (40 MG TOTAL) BY MOUTH DAILY BEFORE BREAKFAST.  Marland Kitchen gabapentin (NEURONTIN) 400 MG capsule TAKE 2 CAPSULES BY MOUTH EVERY MORNING FOR MIGRAINE  PREVENTION  . HYDROcodone-acetaminophen (NORCO) 10-325 MG tablet Take 1 tablet by mouth 2 (two) times daily as needed.  . Multiple Vitamin (MULTIVITAMIN WITH MINERALS) TABS tablet Take 2 tablets by mouth daily.   . nicotine (NICODERM CQ - DOSED IN MG/24 HR) 7 mg/24hr patch Place 1 patch (7 mg total) onto the skin daily.  . OnabotulinumtoxinA (BOTOX IM) Inject 1 Dose into the muscle every 3 (three) months.  . triamterene-hydrochlorothiazide (MAXZIDE) 75-50 MG tablet TAKE 1 TABLET BY MOUTH DAILY.  Marland Kitchen UNABLE TO FIND Blood pressure monitor x 1  DX I10    BP (!) 134/92   Pulse 92   Ht 5\' 2"  (1.575 m)   Wt 176 lb (79.8 kg)   LMP 08/21/2017 (Approximate) Comment: preg. test negative  BMI 32.19 kg/m   Physical Exam Constitutional:      General: She is not in acute distress.    Appearance: She is well-developed.  Cardiovascular:     Comments: No peripheral edema Skin:    General: Skin is warm and dry.     Capillary Refill: Capillary refill takes less than 2 seconds.  Neurological:     Mental Status: She is alert and oriented to person, place, and time.     Sensory: No sensory deficit.     Coordination: Coordination normal.     Gait: Gait normal.     Deep Tendon Reflexes: Reflexes are normal and symmetric.  Psychiatric:        Mood and Affect: Mood normal.        Behavior: Behavior normal.        Thought Content: Thought content normal.        Judgment: Judgment normal.     Ortho Exam  Left wrist joint tight swelling pain and tenderness over the radius scaphoid area her wrist extension is preserved she has decreased wrist flexion no instability  MEDICAL DECISION MAKING  A.  Encounter Diagnosis  Name Primary?  . Pain in left hand Yes    B. DATA ANALYSED:   IMAGING: Interpretation of images: Internal images show slack type wrist with dorsal tilting of the lunate narrowing of the radius scaphoid joint cyst formation in the scaphoid capitate lunate involvement with lunate  and scaphoid preservation  Orders: No new orders  Outside records reviewed: None   C. MANAGEMENT   [facility] to [facility] right wrist also needs to see [facility] for the left wrist  No orders of the defined types were placed in this encounter.  (Chronic problem with exacerbation moderate risk of morbidity will need surgery)   08/23/2017, MD  03/11/2020 9:25 AM

## 2020-03-14 ENCOUNTER — Other Ambulatory Visit: Payer: Self-pay | Admitting: Family Medicine

## 2020-03-14 MED FILL — CYCLOBENZAPRINE HCL 10 MG T: 10 | 10 days supply | Qty: 30 | Fill #1

## 2020-03-14 MED FILL — DICLOFENAC SOD EC 75 MG TAB: 75 | 30 days supply | Qty: 30 | Fill #0

## 2020-03-21 MED FILL — HYDROCODON-APAP 10-325: 10-325 | 30 days supply | Qty: 60 | Fill #0

## 2020-03-23 ENCOUNTER — Telehealth: Payer: Self-pay | Admitting: Family Medicine

## 2020-03-23 NOTE — Telephone Encounter (Signed)
pls call and schedule in office appointment to evaluate blood pressure and labs ordered since July need to be done Tell her I am concerned about her BP! Also needs physical with pap with me, June 6  or after, thanks

## 2020-03-26 NOTE — Telephone Encounter (Signed)
LVM for the patient to call

## 2020-04-01 ENCOUNTER — Other Ambulatory Visit (INDEPENDENT_AMBULATORY_CARE_PROVIDER_SITE_OTHER): Payer: Self-pay

## 2020-04-01 ENCOUNTER — Other Ambulatory Visit (INDEPENDENT_AMBULATORY_CARE_PROVIDER_SITE_OTHER): Payer: Self-pay | Admitting: Gastroenterology

## 2020-04-01 DIAGNOSIS — K219 Gastro-esophageal reflux disease without esophagitis: Secondary | ICD-10-CM

## 2020-04-01 DIAGNOSIS — R1311 Dysphagia, oral phase: Secondary | ICD-10-CM

## 2020-04-01 MED ORDER — ESOMEPRAZOLE MAGNESIUM 40 MG PO CPDR: 40.0000 mg | DELAYED_RELEASE_CAPSULE | Freq: Every day | ORAL | 5 refills | Status: DC

## 2020-04-01 MED FILL — ESOMEPRAZOLE MAG DR 40 MG C: 40 | 30 days supply | Qty: 30 | Fill #0

## 2020-04-01 MED FILL — CYCLOBENZAPRINE HCL 10 MG T: 10 | 10 days supply | Qty: 30 | Fill #2

## 2020-04-02 NOTE — Telephone Encounter (Signed)
No answer

## 2020-04-09 DIAGNOSIS — M542 Cervicalgia: Secondary | ICD-10-CM | POA: Diagnosis not present

## 2020-04-09 DIAGNOSIS — G43719 Chronic migraine without aura, intractable, without status migrainosus: Secondary | ICD-10-CM | POA: Diagnosis not present

## 2020-04-09 DIAGNOSIS — M791 Myalgia, unspecified site: Secondary | ICD-10-CM | POA: Diagnosis not present

## 2020-04-11 DIAGNOSIS — M79642 Pain in left hand: Secondary | ICD-10-CM | POA: Diagnosis not present

## 2020-04-11 DIAGNOSIS — M79641 Pain in right hand: Secondary | ICD-10-CM | POA: Diagnosis not present

## 2020-04-11 DIAGNOSIS — M13839 Other specified arthritis, unspecified wrist: Secondary | ICD-10-CM | POA: Diagnosis not present

## 2020-04-16 DIAGNOSIS — M545 Low back pain, unspecified: Secondary | ICD-10-CM | POA: Diagnosis not present

## 2020-04-16 DIAGNOSIS — M25579 Pain in unspecified ankle and joints of unspecified foot: Secondary | ICD-10-CM | POA: Diagnosis not present

## 2020-04-16 DIAGNOSIS — G8929 Other chronic pain: Secondary | ICD-10-CM | POA: Diagnosis not present

## 2020-04-16 DIAGNOSIS — Z79891 Long term (current) use of opiate analgesic: Secondary | ICD-10-CM | POA: Diagnosis not present

## 2020-04-16 DIAGNOSIS — M25569 Pain in unspecified knee: Secondary | ICD-10-CM | POA: Diagnosis not present

## 2020-04-16 DIAGNOSIS — M79672 Pain in left foot: Secondary | ICD-10-CM | POA: Diagnosis not present

## 2020-04-16 DIAGNOSIS — M25539 Pain in unspecified wrist: Secondary | ICD-10-CM | POA: Diagnosis not present

## 2020-04-16 DIAGNOSIS — G4700 Insomnia, unspecified: Secondary | ICD-10-CM | POA: Diagnosis not present

## 2020-04-16 NOTE — Telephone Encounter (Signed)
No answer

## 2020-04-17 MED FILL — DICLOFENAC SOD EC 75 MG TAB: 75 | 30 days supply | Qty: 30 | Fill #1

## 2020-04-19 ENCOUNTER — Other Ambulatory Visit (HOSPITAL_COMMUNITY): Payer: Self-pay | Admitting: Neurology

## 2020-04-19 MED FILL — hydrOXYzine HCL 25 MG TABS: 25 | 30 days supply | Qty: 30 | Fill #0

## 2020-04-22 NOTE — Telephone Encounter (Signed)
No answer

## 2020-04-30 ENCOUNTER — Other Ambulatory Visit: Payer: Self-pay | Admitting: Family Medicine

## 2020-04-30 MED FILL — ESOMEPRAZOLE MAG DR 40 MG C: 40 | 90 days supply | Qty: 90 | Fill #1

## 2020-04-30 MED FILL — CYCLOBENZAPRINE HCL 10 MG T: 10 | 10 days supply | Qty: 30 | Fill #0

## 2020-05-08 ENCOUNTER — Other Ambulatory Visit (HOSPITAL_COMMUNITY): Payer: Self-pay | Admitting: Physician Assistant

## 2020-05-08 DIAGNOSIS — M13831 Other specified arthritis, right wrist: Secondary | ICD-10-CM | POA: Diagnosis not present

## 2020-05-08 DIAGNOSIS — G8918 Other acute postprocedural pain: Secondary | ICD-10-CM | POA: Diagnosis not present

## 2020-05-08 DIAGNOSIS — M19031 Primary osteoarthritis, right wrist: Secondary | ICD-10-CM | POA: Diagnosis not present

## 2020-05-08 MED FILL — OXYCODONE-APAP 10-325: 10-325 | 5 days supply | Qty: 20 | Fill #0

## 2020-05-21 DIAGNOSIS — M13831 Other specified arthritis, right wrist: Secondary | ICD-10-CM | POA: Diagnosis not present

## 2020-05-21 DIAGNOSIS — Z4789 Encounter for other orthopedic aftercare: Secondary | ICD-10-CM | POA: Diagnosis not present

## 2020-05-21 DIAGNOSIS — M13839 Other specified arthritis, unspecified wrist: Secondary | ICD-10-CM | POA: Diagnosis not present

## 2020-05-30 ENCOUNTER — Other Ambulatory Visit: Payer: Self-pay | Admitting: Family Medicine

## 2020-05-30 DIAGNOSIS — M791 Myalgia, unspecified site: Secondary | ICD-10-CM | POA: Diagnosis not present

## 2020-05-30 DIAGNOSIS — G43719 Chronic migraine without aura, intractable, without status migrainosus: Secondary | ICD-10-CM | POA: Diagnosis not present

## 2020-05-30 DIAGNOSIS — M542 Cervicalgia: Secondary | ICD-10-CM | POA: Diagnosis not present

## 2020-05-30 MED FILL — DICLOFENAC SOD EC 75 MG TAB: 75 | 30 days supply | Qty: 30 | Fill #2

## 2020-05-30 MED FILL — TRIAMTERENE-HCTZ 75-50 MG T: 75-50 | 90 days supply | Qty: 90 | Fill #2

## 2020-05-30 MED FILL — CYCLOBENZAPRINE HCL 10 MG T: 10 | 10 days supply | Qty: 30 | Fill #1

## 2020-05-30 MED FILL — DULoxetine HCL 60 MG CPEP: 60 | 90 days supply | Qty: 90 | Fill #0

## 2020-06-08 ENCOUNTER — Other Ambulatory Visit (HOSPITAL_COMMUNITY): Payer: Self-pay

## 2020-06-11 DIAGNOSIS — M79672 Pain in left foot: Secondary | ICD-10-CM | POA: Diagnosis not present

## 2020-06-11 DIAGNOSIS — M545 Low back pain, unspecified: Secondary | ICD-10-CM | POA: Diagnosis not present

## 2020-06-11 DIAGNOSIS — G8929 Other chronic pain: Secondary | ICD-10-CM | POA: Diagnosis not present

## 2020-06-11 DIAGNOSIS — M25579 Pain in unspecified ankle and joints of unspecified foot: Secondary | ICD-10-CM | POA: Diagnosis not present

## 2020-06-11 DIAGNOSIS — G4700 Insomnia, unspecified: Secondary | ICD-10-CM | POA: Diagnosis not present

## 2020-06-11 DIAGNOSIS — Z79891 Long term (current) use of opiate analgesic: Secondary | ICD-10-CM | POA: Diagnosis not present

## 2020-06-11 DIAGNOSIS — M25539 Pain in unspecified wrist: Secondary | ICD-10-CM | POA: Diagnosis not present

## 2020-06-11 DIAGNOSIS — M25569 Pain in unspecified knee: Secondary | ICD-10-CM | POA: Diagnosis not present

## 2020-06-12 ENCOUNTER — Other Ambulatory Visit (HOSPITAL_COMMUNITY): Payer: Self-pay

## 2020-06-12 MED ORDER — HYDROCODONE-ACETAMINOPHEN 10-325 MG PO TABS
1.0000 | ORAL_TABLET | Freq: Two times a day (BID) | ORAL | 0 refills | Status: DC | PRN
Start: 2020-06-22 — End: 2020-06-20
  Filled ????-??-??: fill #0

## 2020-06-12 MED ORDER — HYDROCODONE-ACETAMINOPHEN 10-325 MG PO TABS
1.0000 | ORAL_TABLET | Freq: Two times a day (BID) | ORAL | 0 refills | Status: DC | PRN
  Filled 2020-06-12 – 2020-07-22 (×4): qty 60, 30d supply, fill #0
  Filled ????-??-??: fill #0

## 2020-06-12 MED ORDER — DULOXETINE HCL 60 MG PO CPEP
60.0000 mg | ORAL_CAPSULE | Freq: Every day | ORAL | 0 refills | Status: DC
  Filled 2020-06-12: qty 90, 90d supply, fill #0

## 2020-06-12 MED ORDER — HYDROXYZINE HCL 25 MG PO TABS
25.0000 mg | ORAL_TABLET | Freq: Every day | ORAL | 0 refills | Status: DC
  Filled 2020-06-12: qty 30, 30d supply, fill #0

## 2020-06-13 ENCOUNTER — Other Ambulatory Visit (HOSPITAL_COMMUNITY): Payer: Self-pay

## 2020-06-14 ENCOUNTER — Other Ambulatory Visit (HOSPITAL_COMMUNITY): Payer: Self-pay

## 2020-06-18 ENCOUNTER — Other Ambulatory Visit (HOSPITAL_COMMUNITY): Payer: Self-pay

## 2020-06-18 DIAGNOSIS — M13839 Other specified arthritis, unspecified wrist: Secondary | ICD-10-CM | POA: Diagnosis not present

## 2020-06-18 DIAGNOSIS — M13831 Other specified arthritis, right wrist: Secondary | ICD-10-CM | POA: Diagnosis not present

## 2020-06-18 DIAGNOSIS — Z4789 Encounter for other orthopedic aftercare: Secondary | ICD-10-CM | POA: Diagnosis not present

## 2020-06-18 MED FILL — Gabapentin Cap 400 MG: ORAL | 45 days supply | Qty: 180 | Fill #0 | Status: CN

## 2020-06-18 MED FILL — Gabapentin Cap 400 MG: ORAL | 90 days supply | Qty: 180 | Fill #0 | Status: AC

## 2020-06-19 ENCOUNTER — Other Ambulatory Visit (HOSPITAL_COMMUNITY): Payer: Self-pay

## 2020-06-20 ENCOUNTER — Other Ambulatory Visit (HOSPITAL_COMMUNITY): Payer: Self-pay

## 2020-06-20 MED ORDER — HYDROCODONE-ACETAMINOPHEN 10-325 MG PO TABS
1.0000 | ORAL_TABLET | Freq: Two times a day (BID) | ORAL | 0 refills | Status: DC | PRN
  Filled 2020-06-20 – 2020-06-21 (×4): qty 60, 30d supply, fill #0

## 2020-06-21 ENCOUNTER — Other Ambulatory Visit (HOSPITAL_COMMUNITY): Payer: Self-pay

## 2020-06-25 ENCOUNTER — Other Ambulatory Visit (HOSPITAL_COMMUNITY): Payer: Self-pay

## 2020-06-26 ENCOUNTER — Other Ambulatory Visit (HOSPITAL_COMMUNITY): Payer: Self-pay

## 2020-06-27 ENCOUNTER — Other Ambulatory Visit (HOSPITAL_COMMUNITY): Payer: Self-pay

## 2020-07-01 ENCOUNTER — Other Ambulatory Visit (HOSPITAL_COMMUNITY): Payer: Self-pay

## 2020-07-01 DIAGNOSIS — Z472 Encounter for removal of internal fixation device: Secondary | ICD-10-CM | POA: Diagnosis not present

## 2020-07-01 MED ORDER — OXYCODONE-ACETAMINOPHEN 10-325 MG PO TABS
1.0000 | ORAL_TABLET | Freq: Four times a day (QID) | ORAL | 0 refills | Status: DC
  Filled 2020-07-01: qty 20, 5d supply, fill #0

## 2020-07-10 ENCOUNTER — Other Ambulatory Visit (HOSPITAL_COMMUNITY): Payer: Self-pay

## 2020-07-10 DIAGNOSIS — M791 Myalgia, unspecified site: Secondary | ICD-10-CM | POA: Diagnosis not present

## 2020-07-10 DIAGNOSIS — M542 Cervicalgia: Secondary | ICD-10-CM | POA: Diagnosis not present

## 2020-07-10 DIAGNOSIS — G43719 Chronic migraine without aura, intractable, without status migrainosus: Secondary | ICD-10-CM | POA: Diagnosis not present

## 2020-07-11 ENCOUNTER — Other Ambulatory Visit (HOSPITAL_COMMUNITY): Payer: Self-pay

## 2020-07-12 DIAGNOSIS — M13831 Other specified arthritis, right wrist: Secondary | ICD-10-CM | POA: Diagnosis not present

## 2020-07-12 DIAGNOSIS — Z4789 Encounter for other orthopedic aftercare: Secondary | ICD-10-CM | POA: Diagnosis not present

## 2020-07-12 DIAGNOSIS — M25631 Stiffness of right wrist, not elsewhere classified: Secondary | ICD-10-CM | POA: Diagnosis not present

## 2020-07-16 DIAGNOSIS — M25631 Stiffness of right wrist, not elsewhere classified: Secondary | ICD-10-CM | POA: Diagnosis not present

## 2020-07-19 DIAGNOSIS — M25631 Stiffness of right wrist, not elsewhere classified: Secondary | ICD-10-CM | POA: Diagnosis not present

## 2020-07-22 ENCOUNTER — Other Ambulatory Visit (HOSPITAL_COMMUNITY): Payer: Self-pay

## 2020-07-23 DIAGNOSIS — M25631 Stiffness of right wrist, not elsewhere classified: Secondary | ICD-10-CM | POA: Diagnosis not present

## 2020-07-24 ENCOUNTER — Other Ambulatory Visit (HOSPITAL_COMMUNITY): Payer: Self-pay

## 2020-07-26 DIAGNOSIS — M25631 Stiffness of right wrist, not elsewhere classified: Secondary | ICD-10-CM | POA: Diagnosis not present

## 2020-07-30 DIAGNOSIS — M25631 Stiffness of right wrist, not elsewhere classified: Secondary | ICD-10-CM | POA: Diagnosis not present

## 2020-08-02 DIAGNOSIS — M13839 Other specified arthritis, unspecified wrist: Secondary | ICD-10-CM | POA: Diagnosis not present

## 2020-08-02 DIAGNOSIS — Z4789 Encounter for other orthopedic aftercare: Secondary | ICD-10-CM | POA: Diagnosis not present

## 2020-08-02 DIAGNOSIS — M25631 Stiffness of right wrist, not elsewhere classified: Secondary | ICD-10-CM | POA: Diagnosis not present

## 2020-08-06 DIAGNOSIS — M25631 Stiffness of right wrist, not elsewhere classified: Secondary | ICD-10-CM | POA: Diagnosis not present

## 2020-08-07 ENCOUNTER — Telehealth: Payer: Self-pay | Admitting: Orthopedic Surgery

## 2020-08-07 NOTE — Telephone Encounter (Signed)
Patient called to inquire about having the gel injections again in her right knee. States was advised at last series of 3, in December of 2021, that she may have them again 6 months if [facility] insurance approves. States that this last series of 3 worked great. Please advise.

## 2020-08-08 ENCOUNTER — Other Ambulatory Visit: Payer: Self-pay | Admitting: Family Medicine

## 2020-08-08 ENCOUNTER — Other Ambulatory Visit (HOSPITAL_COMMUNITY): Payer: Self-pay

## 2020-08-08 DIAGNOSIS — M545 Low back pain, unspecified: Secondary | ICD-10-CM | POA: Diagnosis not present

## 2020-08-08 DIAGNOSIS — M25539 Pain in unspecified wrist: Secondary | ICD-10-CM | POA: Diagnosis not present

## 2020-08-08 DIAGNOSIS — G4700 Insomnia, unspecified: Secondary | ICD-10-CM | POA: Diagnosis not present

## 2020-08-08 DIAGNOSIS — M25579 Pain in unspecified ankle and joints of unspecified foot: Secondary | ICD-10-CM | POA: Diagnosis not present

## 2020-08-08 DIAGNOSIS — Z79891 Long term (current) use of opiate analgesic: Secondary | ICD-10-CM | POA: Diagnosis not present

## 2020-08-08 DIAGNOSIS — M25569 Pain in unspecified knee: Secondary | ICD-10-CM | POA: Diagnosis not present

## 2020-08-08 DIAGNOSIS — M79672 Pain in left foot: Secondary | ICD-10-CM | POA: Diagnosis not present

## 2020-08-08 DIAGNOSIS — G8929 Other chronic pain: Secondary | ICD-10-CM | POA: Diagnosis not present

## 2020-08-08 MED ORDER — HYDROXYZINE HCL 25 MG PO TABS
25.0000 mg | ORAL_TABLET | Freq: Every evening | ORAL | 0 refills | Status: DC
  Filled 2020-08-08: qty 30, 30d supply, fill #0

## 2020-08-08 MED ORDER — DULOXETINE HCL 60 MG PO CPEP
60.0000 mg | ORAL_CAPSULE | Freq: Every day | ORAL | 0 refills | Status: DC
  Filled 2020-08-08: qty 90, 90d supply, fill #0

## 2020-08-08 MED FILL — Esomeprazole Magnesium Cap Delayed Release 40 MG (Base Eq): ORAL | 30 days supply | Qty: 30 | Fill #0 | Status: AC

## 2020-08-08 NOTE — Telephone Encounter (Signed)
3rd Euflexxa 02/26/20.  Right knee.

## 2020-08-09 ENCOUNTER — Other Ambulatory Visit (HOSPITAL_COMMUNITY): Payer: Self-pay

## 2020-08-09 ENCOUNTER — Other Ambulatory Visit: Payer: Self-pay | Admitting: Family Medicine

## 2020-08-09 DIAGNOSIS — M25631 Stiffness of right wrist, not elsewhere classified: Secondary | ICD-10-CM | POA: Diagnosis not present

## 2020-08-12 ENCOUNTER — Other Ambulatory Visit (HOSPITAL_COMMUNITY): Payer: Self-pay

## 2020-08-12 MED ORDER — DICLOFENAC SODIUM 75 MG PO TBEC
75.0000 mg | DELAYED_RELEASE_TABLET | Freq: Every day | ORAL | 2 refills | Status: DC | PRN
  Filled 2020-08-12: qty 30, 30d supply, fill #0
  Filled 2020-10-29: qty 30, 30d supply, fill #1
  Filled 2020-12-10: qty 30, 30d supply, fill #2

## 2020-08-13 DIAGNOSIS — M25631 Stiffness of right wrist, not elsewhere classified: Secondary | ICD-10-CM | POA: Diagnosis not present

## 2020-08-13 NOTE — Telephone Encounter (Signed)
Submitted online for BV.

## 2020-08-14 NOTE — Telephone Encounter (Signed)
Insurance covers 80%, patient will owe 20% OOP.  No PA needed, buy and bill ok.  No copay. Ready to schedule.  Sending you an email as well.

## 2020-08-14 NOTE — Telephone Encounter (Signed)
She will owe 20 percent of the meds.I have checked price and they are about  Also advised will be after June 20th

## 2020-08-15 ENCOUNTER — Other Ambulatory Visit (HOSPITAL_COMMUNITY): Payer: Self-pay

## 2020-08-15 MED ORDER — BOTOX 100 UNITS IJ SOLR
INTRAMUSCULAR | 3 refills | Status: DC
  Filled 2020-08-15: qty 2, 84d supply, fill #0
  Filled 2020-11-21: qty 2, 84d supply, fill #1
  Filled 2021-02-20: qty 2, 84d supply, fill #2
  Filled 2021-05-22 – 2021-05-26 (×2): qty 2, 84d supply, fill #3

## 2020-08-15 MED FILL — Amlodipine Besylate Tab 5 MG (Base Equivalent): ORAL | 90 days supply | Qty: 90 | Fill #0 | Status: AC

## 2020-08-16 ENCOUNTER — Other Ambulatory Visit (HOSPITAL_COMMUNITY): Payer: Self-pay

## 2020-08-20 DIAGNOSIS — M25631 Stiffness of right wrist, not elsewhere classified: Secondary | ICD-10-CM | POA: Diagnosis not present

## 2020-08-23 ENCOUNTER — Other Ambulatory Visit (HOSPITAL_COMMUNITY): Payer: Self-pay

## 2020-08-26 ENCOUNTER — Encounter: Payer: 59 | Admitting: Nurse Practitioner

## 2020-08-26 ENCOUNTER — Other Ambulatory Visit (HOSPITAL_COMMUNITY): Payer: Self-pay

## 2020-08-26 MED FILL — Gabapentin Cap 400 MG: ORAL | 90 days supply | Qty: 180 | Fill #1 | Status: CN

## 2020-08-27 ENCOUNTER — Other Ambulatory Visit (HOSPITAL_COMMUNITY): Payer: Self-pay

## 2020-08-27 MED ORDER — DULOXETINE HCL 60 MG PO CPEP
60.0000 mg | ORAL_CAPSULE | Freq: Every day | ORAL | 0 refills | Status: DC
  Filled 2020-08-27: qty 90, 90d supply, fill #0

## 2020-08-27 MED ORDER — HYDROCODONE-ACETAMINOPHEN 10-325 MG PO TABS
1.0000 | ORAL_TABLET | Freq: Two times a day (BID) | ORAL | 0 refills | Status: DC | PRN
  Filled 2020-08-27: qty 60, 30d supply, fill #0

## 2020-08-27 MED ORDER — HYDROXYZINE HCL 25 MG PO TABS
25.0000 mg | ORAL_TABLET | Freq: Every day | ORAL | 0 refills | Status: DC
  Filled 2020-08-27: qty 30, 30d supply, fill #0

## 2020-08-27 MED ORDER — HYDROCODONE-ACETAMINOPHEN 10-325 MG PO TABS
1.0000 | ORAL_TABLET | Freq: Two times a day (BID) | ORAL | 0 refills | Status: DC | PRN
  Filled 2020-09-26: qty 60, 30d supply, fill #0

## 2020-08-28 ENCOUNTER — Other Ambulatory Visit: Payer: Self-pay

## 2020-08-28 ENCOUNTER — Other Ambulatory Visit (HOSPITAL_COMMUNITY): Payer: Self-pay

## 2020-08-28 ENCOUNTER — Ambulatory Visit (INDEPENDENT_AMBULATORY_CARE_PROVIDER_SITE_OTHER): Payer: 59 | Admitting: Orthopedic Surgery

## 2020-08-28 DIAGNOSIS — G8929 Other chronic pain: Secondary | ICD-10-CM

## 2020-08-28 DIAGNOSIS — M1711 Unilateral primary osteoarthritis, right knee: Secondary | ICD-10-CM

## 2020-08-28 NOTE — Progress Notes (Signed)
Chief Complaint  Patient presents with   Injections    Euflexxa #1 right knee   Encounter Diagnoses  Name Primary?   Chronic pain of right knee Yes   Primary osteoarthritis of right knee     First Euflexxa injection right knee for chronic right knee pain  Procedure  Right knee hyaluronic acid injection Preparation Euflexxa #1 of 3 Right knee confirmed Ethyl chloride and alcohol skin prep Lateral approach to the right knee medication injected no complications  Return in a week repeat injection right knee

## 2020-08-29 DIAGNOSIS — G518 Other disorders of facial nerve: Secondary | ICD-10-CM | POA: Diagnosis not present

## 2020-08-29 DIAGNOSIS — M791 Myalgia, unspecified site: Secondary | ICD-10-CM | POA: Diagnosis not present

## 2020-08-29 DIAGNOSIS — M542 Cervicalgia: Secondary | ICD-10-CM | POA: Diagnosis not present

## 2020-08-29 DIAGNOSIS — G43719 Chronic migraine without aura, intractable, without status migrainosus: Secondary | ICD-10-CM | POA: Diagnosis not present

## 2020-09-03 DIAGNOSIS — M25631 Stiffness of right wrist, not elsewhere classified: Secondary | ICD-10-CM | POA: Diagnosis not present

## 2020-09-04 ENCOUNTER — Ambulatory Visit (INDEPENDENT_AMBULATORY_CARE_PROVIDER_SITE_OTHER): Payer: 59 | Admitting: Orthopedic Surgery

## 2020-09-04 ENCOUNTER — Other Ambulatory Visit: Payer: Self-pay

## 2020-09-04 ENCOUNTER — Encounter: Payer: Self-pay | Admitting: Orthopedic Surgery

## 2020-09-04 VITALS — BP 124/82 | HR 91 | Ht 62.0 in | Wt 175.8 lb

## 2020-09-04 DIAGNOSIS — M1711 Unilateral primary osteoarthritis, right knee: Secondary | ICD-10-CM

## 2020-09-04 DIAGNOSIS — M25561 Pain in right knee: Secondary | ICD-10-CM

## 2020-09-04 NOTE — Progress Notes (Signed)
FOLLOW UP   Encounter Diagnoses  Name Primary?   Chronic pain of right knee Yes   Primary osteoarthritis of right knee      Chief Complaint  Patient presents with   Injections    Euflexxa #2, right knee     Procedure note for injection of hyaluronic acid   Diagnosis osteoarthritis of the knee  Verbal consent was obtained to inject the knee with HYALURONIC ACID . Timeout was completed to confirm the injection site as the RIGHT Knee  Ethyl chloride spray was used for anesthesia Alcohol was used to prep the skin. The infrapatellar lateral portal was used as an injection site and 1 vial of hyaluronic acid  was injected into the knee  Specific Co. Preparation: EUFLEXA  No complications were noted  RETURN 1  WEEK

## 2020-09-04 NOTE — Patient Instructions (Signed)
ICE AS NEEDED

## 2020-09-05 ENCOUNTER — Ambulatory Visit (INDEPENDENT_AMBULATORY_CARE_PROVIDER_SITE_OTHER): Payer: 59 | Admitting: Nurse Practitioner

## 2020-09-05 ENCOUNTER — Other Ambulatory Visit (HOSPITAL_COMMUNITY): Payer: Self-pay

## 2020-09-05 ENCOUNTER — Encounter: Payer: Self-pay | Admitting: Nurse Practitioner

## 2020-09-05 ENCOUNTER — Other Ambulatory Visit (HOSPITAL_COMMUNITY)
Admission: RE | Admit: 2020-09-05 | Discharge: 2020-09-05 | Disposition: A | Discharge status: Home / Self Care | Payer: 59 | Source: Ambulatory Visit | Attending: Nurse Practitioner | Admitting: Nurse Practitioner

## 2020-09-05 VITALS — BP 112/76 | HR 92 | Temp 96.9°F | Ht 62.0 in | Wt 176.0 lb

## 2020-09-05 DIAGNOSIS — Z23 Encounter for immunization: Secondary | ICD-10-CM | POA: Diagnosis not present

## 2020-09-05 DIAGNOSIS — Z139 Encounter for screening, unspecified: Secondary | ICD-10-CM

## 2020-09-05 DIAGNOSIS — R7303 Prediabetes: Secondary | ICD-10-CM

## 2020-09-05 DIAGNOSIS — Z0001 Encounter for general adult medical examination with abnormal findings: Secondary | ICD-10-CM

## 2020-09-05 DIAGNOSIS — Z124 Encounter for screening for malignant neoplasm of cervix: Secondary | ICD-10-CM | POA: Diagnosis not present

## 2020-09-05 DIAGNOSIS — F17200 Nicotine dependence, unspecified, uncomplicated: Secondary | ICD-10-CM

## 2020-09-05 DIAGNOSIS — I10 Essential (primary) hypertension: Secondary | ICD-10-CM | POA: Diagnosis not present

## 2020-09-05 MED ORDER — NICOTINE 7 MG/24HR TD PT24: 7.0000 mg | MEDICATED_PATCH | Freq: Every day | TRANSDERMAL | 0 refills | Status: DC

## 2020-09-05 MED ORDER — NICOTINE 21-14-7 MG/24HR TD KIT
1.0000 | PACK | Freq: Every day | TRANSDERMAL | 0 refills | Status: DC
  Filled 2020-09-05: qty 1, fill #0

## 2020-09-05 MED ORDER — NICOTINE 14 MG/24HR TD PT24: 14.0000 mg | MEDICATED_PATCH | Freq: Every day | TRANSDERMAL | 0 refills | Status: DC

## 2020-09-05 MED ORDER — NICOTINE 21 MG/24HR TD PT24
21.0000 mg | MEDICATED_PATCH | Freq: Every day | TRANSDERMAL | 0 refills | Status: DC
  Filled 2020-09-05: qty 28, 28d supply, fill #0

## 2020-09-05 MED ORDER — NICOTINE POLACRILEX 2 MG MT GUM
2.0000 mg | CHEWING_GUM | OROMUCOSAL | 0 refills | Status: DC | PRN
Start: 2020-09-05 — End: 2021-01-02
  Filled 2020-09-05: qty 110, 30d supply, fill #0

## 2020-09-05 NOTE — Patient Instructions (Addendum)
Please have labs drawn today.  We will check labs again in 6 months. Please come to that appointment fasting for the lab draw.

## 2020-09-05 NOTE — Progress Notes (Signed)
Established Patient Office Visit  Subjective:  Patient ID: Zoe Bailey, female    DOB: 04/17/1965  Age: 55 y.o. MRN: [id number]  CC:  Chief Complaint  Patient presents with   Annual Exam    CPE    HPI Zoe Bailey presents for physical exam.  At last OV, she started nicotine patch to stop smoking. She was only smoking 7 cigarettes per day.  She was started on amlodipine 5 mg daily for HTN. BP has been well controlled.  She has pain in her wrists, ankles, and knees, but is followed by ortho.  Past Medical History:  Diagnosis Date   Arthritis    Cancer ([place]) 2010 approx   skin cancer   Complication of anesthesia    Wakes up during all surgeries   COPD (chronic obstructive pulmonary disease) ([place])    Depression    Dysphagia 12/22/2018   Foot pain, right 03/2014   Hemorrhoid 08/11/2017   Menometrorrhagia 07/14/2013   Migraine    USES BOTOX FOR TREATMENT    Oral phase dysphagia 01/24/2019   Added automatically from request for surgery [id number]   Polymenorrhea 07/11/2013   Started two cycles per month since 03/2013   Shortness of breath dyspnea    Sleep apnea    Toenail fungus     Past Surgical History:  Procedure Laterality Date   ANKLE FRACTURE SURGERY Right    BREAST ENHANCEMENT SURGERY     CARPAL TUNNEL RELEASE Right    x2   CHONDROPLASTY Right 02/17/2018   Procedure: CHONDROPLASTY;  Surgeon: Paralee Cancel, MD;  Location: [facility];  Service: Orthopedics;  Laterality: Right;   COLONOSCOPY WITH PROPOFOL N/A 05/07/2017   Procedure: COLONOSCOPY WITH PROPOFOL;  Surgeon: Rogene Houston, MD;  Location: [facility];  Service: Endoscopy;  Laterality: N/A;  8:30   ESOPHAGOGASTRODUODENOSCOPY (EGD) WITH PROPOFOL N/A 03/06/2019   Procedure: ESOPHAGOGASTRODUODENOSCOPY (EGD) WITH PROPOFOL;  Surgeon: Rogene Houston, MD;  Location: [facility];  Service: Endoscopy;  Laterality: N/A;   HERNIA REPAIR     umbilical   KNEE ARTHROSCOPY WITH MEDIAL MENISECTOMY Right 02/17/2018    Procedure: KNEE ARTHROSCOPY WITH MEDIAL MENISECTOMY;  Surgeon: Paralee Cancel, MD;  Location: [facility];  Service: Orthopedics;  Laterality: Right;   MALONEY DILATION  03/06/2019   Procedure: MALONEY DILATION;  Surgeon: Rogene Houston, MD;  Location: [facility];  Service: Endoscopy;;   NASAL POLYP SURGERY     POLYPECTOMY      Family History  Problem Relation Age of Onset   Arthritis Other    Cancer Other     Social History   Socioeconomic History   Marital status: Divorced    Spouse name: Not on file   Number of children: 1   Years of education: college   Highest education level: Not on file  Occupational History   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 Lives alone.  Has 2 dogs   Social Determinants of Radio broadcast assistant Strain: Not on file  Food Insecurity: Not on file  Transportation Needs: Not on file  Physical Activity: Not on file  Stress: Not on file  Social Connections: Not on file  Intimate Partner Violence: Not on file    Outpatient Medications Prior to Visit  Medication Sig Dispense Refill   amLODipine (NORVASC) 5 MG tablet TAKE 1 TABLET (5 MG TOTAL) BY MOUTH DAILY. 90 tablet 3   botulinum toxin Type A (BOTOX) 100 units SOLR injection 200 units to be injected intramuscularly in the head,neck & trunk area every 12 weeks 2 each 3   busPIRone (BUSPAR) 10 MG tablet TAKE 1 TABLET BY MOUTH 3 TIMES DAILY. 90 tablet 3   cyclobenzaprine (FLEXERIL) 10 MG tablet TAKE 1 TABLET BY MOUTH THREE TIMES DAILY AS  NEEDED FOR MUSCLE SPASMS. 30 tablet 2   diclofenac (VOLTAREN) 75 MG EC tablet Take 1 tablet (75 mg total) by mouth daily as needed for pain 30 tablet 2   docusate sodium (COLACE) 100 MG capsule TAKE 1 CAPSULE EVERY DAY BY ORAL ROUTE FOR 15 DAYS. 15 capsule 0   DULoxetine (CYMBALTA) 60 MG capsule TAKE 1 CAPSULE BY MOUTH ONCE DAILY AT BEDTIME. 30 capsule 5   esomeprazole (NEXIUM) 40 MG capsule Take 1 capsule (40 mg total) by mouth daily before breakfast. 30 capsule 5   gabapentin (NEURONTIN) 400 MG capsule TAKE 2 CAPSULES BY MOUTH EVERY MORNING FOR MIGRAINE PREVENTION 180 capsule 2   [START ON 09/26/2020] HYDROcodone-acetaminophen (NORCO) 10-325 MG tablet Take 1 tablet by mouth 2 (two) times daily as needed. do not fill before 09/26/20 60 tablet 0   hydrOXYzine (ATARAX/VISTARIL) 25 MG tablet Take 1 tablet (25 mg total) by mouth at bedtime. 30 tablet 0   Multiple Vitamin (MULTIVITAMIN WITH MINERALS) TABS tablet Take 2 tablets by mouth daily.      OnabotulinumtoxinA (BOTOX IM) Inject 1 Dose into the muscle every 3 (three) months.     triamterene-hydrochlorothiazide (MAXZIDE) 75-50 MG tablet TAKE 1 TABLET BY MOUTH DAILY. 90 tablet 3   UNABLE TO FIND Blood pressure monitor x 1  DX I10 1 each 0   HYDROcodone-acetaminophen (NORCO) 10-325 MG tablet Take 1 tablet by mouth 2 (two) times daily as needed. 60 tablet 0   HYDROcodone-acetaminophen (NORCO) 10-325 MG tablet Take 1 tablet by mouth 2 (two) times daily as needed. 60 tablet 0   nicotine (NICODERM CQ - DOSED IN MG/24 HR) 7 mg/24hr patch Place 1 patch (7 mg total) onto the skin daily. 28 patch 1   tiotropium (SPIRIVA HANDIHALER) 18 MCG inhalation capsule Place 1 capsule (18 mcg total) into inhaler and inhale daily for 30 days. (Patient taking differently: Place 18 mcg into inhaler and inhale daily as needed (shortness of breath).) 30 capsule 3   DULoxetine (CYMBALTA) 60 MG capsule Take 1 capsule (60 mg total) by mouth at bedtime. (Patient not taking:  Reported on 09/05/2020) 90 capsule 0   hydrOXYzine (ATARAX/VISTARIL) 25 MG tablet TAKE 1 TABLET BY MOUTH EVERY DAY AT BEDTIME FOR CHRONIC PAIN & INSOMNIA (Patient not taking: Reported on 09/05/2020) 30 tablet 1   hydrOXYzine (ATARAX/VISTARIL) 25 MG tablet Take 1 tablet (25 mg total) by mouth at bedtime for chronic pain and insomnia. (Patient not taking: Reported on 09/05/2020) 30 tablet 0   No facility-administered medications prior to visit.    Allergies  Allergen Reactions   Effexor [Venlafaxine] Other (See Comments)  Decreased libido   Other Other (See Comments)    Pt has a fear of tourniquets    ROS Review of Systems  Constitutional: Negative.   HENT: Negative.    Eyes: Negative.   Respiratory: Negative.    Cardiovascular: Negative.   Gastrointestinal: Negative.   Endocrine: Negative.   Genitourinary: Negative.   Musculoskeletal:  Positive for arthralgias.  Skin: Negative.   Allergic/Immunologic: Negative.   Neurological: Negative.   Hematological: Negative.   Psychiatric/Behavioral: Negative.       Objective:    Physical Exam Constitutional:      Appearance: Normal appearance.  HENT:     Head: Normocephalic and atraumatic.     Right Ear: Tympanic membrane, ear canal and external ear normal.     Left Ear: Tympanic membrane, ear canal and external ear normal.     Nose: Nose normal.     Mouth/Throat:     Mouth: Mucous membranes are moist.     Pharynx: Oropharynx is clear.  Eyes:     Extraocular Movements: Extraocular movements intact.     Conjunctiva/sclera: Conjunctivae normal.     Pupils: Pupils are equal, round, and reactive to light.  Cardiovascular:     Rate and Rhythm: Normal rate and regular rhythm.     Pulses: Normal pulses.     Heart sounds: Normal heart sounds.  Pulmonary:     Effort: Pulmonary effort is normal.     Breath sounds: Normal breath sounds.  Abdominal:     General: Abdomen is flat. Bowel sounds are normal.     Palpations: Abdomen is  soft.  Genitourinary:    General: Normal vulva.     Rectum: Normal.  Musculoskeletal:        General: Normal range of motion.     Cervical back: Normal range of motion and neck supple.  Skin:    General: Skin is warm and dry.     Capillary Refill: Capillary refill takes less than 2 seconds.  Neurological:     General: No focal deficit present.     Mental Status: She is alert and oriented to person, place, and time.     Cranial Nerves: No cranial nerve deficit.     Sensory: No sensory deficit.     Motor: No weakness.     Coordination: Coordination normal.     Gait: Gait normal.  Psychiatric:        Mood and Affect: Mood normal.        Behavior: Behavior normal.        Thought Content: Thought content normal.        Judgment: Judgment normal.    BP 112/76 (BP Location: Left Arm, Patient Position: Sitting, Cuff Size: Large)   Pulse 92   Temp (!) 96.9 F (36.1 C) (Temporal)   Ht '5\' 2"'  (1.575 m)   Wt 176 lb (79.8 kg)   LMP 08/21/2017 (Approximate) Comment: preg. test negative  SpO2 92%   BMI 32.19 kg/m  Wt Readings from Last 3 Encounters:  09/05/20 176 lb (79.8 kg)  09/04/20 175 lb 12.8 oz (79.7 kg)  03/11/20 176 lb (79.8 kg)     Health Maintenance Due  Topic Date Due   Pneumococcal Vaccine 88-22 Years old (1 - PCV) Never done   PAP SMEAR-Modifier  08/11/2020    There are no preventive care reminders to display for this patient.  Lab Results  Component Value Date   TSH 2.509 09/16/2018   Lab Results  Component Value  Date   WBC 10.2 09/16/2018   HGB 14.1 09/16/2018   HCT 42.8 09/16/2018   MCV 93.4 09/16/2018   PLT 219 09/16/2018   Lab Results  Component Value Date   NA 138 09/16/2018   K 4.5 09/16/2018   CO2 25 09/16/2018   GLUCOSE 103 (H) 09/16/2018   BUN 17 09/16/2018   CREATININE 0.72 09/16/2018   BILITOT 0.5 09/16/2018   ALKPHOS 91 09/16/2018   AST 20 09/16/2018   ALT 19 09/16/2018   PROT 7.5 09/16/2018   ALBUMIN 4.0 09/16/2018   CALCIUM 9.0  09/16/2018   ANIONGAP 8 09/16/2018   Lab Results  Component Value Date   CHOL 202 (H) 09/16/2018   Lab Results  Component Value Date   HDL 62 09/16/2018   Lab Results  Component Value Date   LDLCALC 126 (H) 09/16/2018   Lab Results  Component Value Date   TRIG 68 09/16/2018   Lab Results  Component Value Date   CHOLHDL 3.3 09/16/2018   Lab Results  Component Value Date   HGBA1C 5.5 10/05/2019   HGBA1C 5.5 10/05/2019   HGBA1C 5.5 (A) 10/05/2019   HGBA1C 5.5 10/05/2019      Assessment & Plan:   Problem List Items Addressed This Visit       Cardiovascular and Mediastinum   Essential hypertension    BP Readings from Last 3 Encounters:  09/05/20 112/76  09/04/20 124/82  03/11/20 (!) 134/92  -BP well controlled -continue current meds       Relevant Orders   CBC with Differential/Platelet   CMP14+EGFR   Lipid Panel With LDL/HDL Ratio     Other   NICOTINE ADDICTION   Relevant Medications   Nicotine 21-14-7 MG/24HR KIT   nicotine polacrilex (NICORETTE) 2 MG gum   Prediabetes    -will check A1c with labs       Relevant Orders   Hemoglobin A1c   Microalbumin / creatinine urine ratio   Immunization due    -Prevnar-13 administered today -has hx of COPD and prediabetes       Other Visit Diagnoses     Encounter for general adult medical examination with abnormal findings    -  Primary   Relevant Orders   CBC with Differential/Platelet   CMP14+EGFR   Lipid Panel With LDL/HDL Ratio   Hemoglobin A1c   Screening due       Relevant Orders   Hepatitis C Antibody   Pap smear for cervical cancer screening       Relevant Orders   Cytology - PAP       Meds ordered this encounter  Medications   Nicotine 21-14-7 MG/24HR KIT    Sig: Place 1 patch onto the skin daily. Alternate patch sites.    Dispense:  1 kit    Refill:  0   nicotine polacrilex (NICORETTE) 2 MG gum    Sig: Take 1 each (2 mg total) by mouth as needed for smoking cessation.     Dispense:  110 tablet    Refill:  0    Follow-up: Return in about 6 months (around 03/07/2021) for Lab follow-up (prediabetes, BP check)- same day fasting labs.    Noreene Larsson, NP

## 2020-09-05 NOTE — Assessment & Plan Note (Signed)
BP Readings from Last 3 Encounters:  09/05/20 112/76  09/04/20 124/82  03/11/20 (!) 134/92   -BP well controlled -continue current meds

## 2020-09-05 NOTE — Addendum Note (Signed)
Addended by: Laretta Bolster on: 09/05/2020 11:20 AM   Modules accepted: Orders

## 2020-09-05 NOTE — Assessment & Plan Note (Signed)
-  Prevnar-13 administered today -has hx of COPD and prediabetes

## 2020-09-05 NOTE — Assessment & Plan Note (Signed)
-  will check A1c with labs

## 2020-09-06 ENCOUNTER — Other Ambulatory Visit (HOSPITAL_COMMUNITY): Payer: Self-pay

## 2020-09-06 DIAGNOSIS — Z4789 Encounter for other orthopedic aftercare: Secondary | ICD-10-CM | POA: Diagnosis not present

## 2020-09-06 DIAGNOSIS — M25631 Stiffness of right wrist, not elsewhere classified: Secondary | ICD-10-CM | POA: Diagnosis not present

## 2020-09-06 LAB — CYTOLOGY - PAP
Adequacy: ABSENT
Comment: NEGATIVE
Diagnosis: NEGATIVE
High risk HPV: POSITIVE — AB

## 2020-09-06 MED FILL — Cyclobenzaprine HCl Tab 10 MG: ORAL | 10 days supply | Qty: 30 | Fill #0 | Status: AC

## 2020-09-06 MED FILL — Esomeprazole Magnesium Cap Delayed Release 40 MG (Base Eq): ORAL | 30 days supply | Qty: 30 | Fill #1 | Status: AC

## 2020-09-06 NOTE — Progress Notes (Signed)
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

## 2020-09-06 NOTE — Progress Notes (Signed)
Labs look good. Alk. Phos. Is a little bit elevated, but taht is a non-specific enzyme. We will monitor that with routine labs.  LDL is slightly elevated, so cut back on fried/fatty foods. Not high enough to add/change cholesterol medicine.

## 2020-09-07 LAB — CMP14+EGFR
ALT: 21 IU/L (ref 0–32)
AST: 22 IU/L (ref 0–40)
Albumin/Globulin Ratio: 1.6 (ref 1.2–2.2)
Albumin: 4.2 g/dL (ref 3.8–4.9)
Alkaline Phosphatase: 135 IU/L — ABNORMAL HIGH (ref 44–121)
BUN/Creatinine Ratio: 28 — ABNORMAL HIGH (ref 9–23)
BUN: 22 mg/dL (ref 6–24)
Bilirubin Total: 0.2 mg/dL (ref 0.0–1.2)
CO2: 22 mmol/L (ref 20–29)
Calcium: 9.5 mg/dL (ref 8.7–10.2)
Chloride: 102 mmol/L (ref 96–106)
Creatinine, Ser: 0.78 mg/dL (ref 0.57–1.00)
Globulin, Total: 2.6 g/dL (ref 1.5–4.5)
Glucose: 90 mg/dL (ref 65–99)
Potassium: 4.3 mmol/L (ref 3.5–5.2)
Sodium: 139 mmol/L (ref 134–144)
Total Protein: 6.8 g/dL (ref 6.0–8.5)
eGFR: 90 mL/min/{1.73_m2} (ref >59)

## 2020-09-07 LAB — CBC WITH DIFFERENTIAL/PLATELET
Basophils Absolute: 0.1 10*3/uL (ref 0.0–0.2)
Basos: 1 %
EOS (ABSOLUTE): 0.2 10*3/uL (ref 0.0–0.4)
Eos: 2 %
Hematocrit: 41.6 % (ref 34.0–46.6)
Hemoglobin: 13.2 g/dL (ref 11.1–15.9)
Immature Grans (Abs): 0 10*3/uL (ref 0.0–0.1)
Immature Granulocytes: 0 %
Lymphocytes Absolute: 2.8 10*3/uL (ref 0.7–3.1)
Lymphs: 29 %
MCH: 30.8 pg (ref 26.6–33.0)
MCHC: 31.7 g/dL (ref 31.5–35.7)
MCV: 97 fL (ref 79–97)
Monocytes Absolute: 0.4 10*3/uL (ref 0.1–0.9)
Monocytes: 4 %
Neutrophils Absolute: 6.3 10*3/uL (ref 1.4–7.0)
Neutrophils: 64 %
Platelets: 320 10*3/uL (ref 150–450)
RBC: 4.29 x10E6/uL (ref 3.77–5.28)
RDW: 12.4 % (ref 11.7–15.4)
WBC: 9.8 10*3/uL (ref 3.4–10.8)

## 2020-09-07 LAB — HEMOGLOBIN A1C
Est. average glucose Bld gHb Est-mCnc: 111 mg/dL
Hgb A1c MFr Bld: 5.5 % (ref 4.8–5.6)

## 2020-09-07 LAB — LIPID PANEL WITH LDL/HDL RATIO
Cholesterol, Total: 183 mg/dL (ref 100–199)
HDL: 54 mg/dL (ref >39)
LDL Chol Calc (NIH): 108 mg/dL — ABNORMAL HIGH (ref 0–99)
LDL/HDL Ratio: 2 ratio (ref 0.0–3.2)
Triglycerides: 120 mg/dL (ref 0–149)
VLDL Cholesterol Cal: 21 mg/dL (ref 5–40)

## 2020-09-07 LAB — MICROALBUMIN / CREATININE URINE RATIO
Creatinine, Urine: 127.5 mg/dL
Microalb/Creat Ratio: <2 mg/g creat (ref 0–29)
Microalbumin, Urine: <3 ug/mL

## 2020-09-07 LAB — HEPATITIS C ANTIBODY: Hep C Virus Ab: <0.1 s/co ratio (ref 0.0–0.9)

## 2020-09-10 ENCOUNTER — Telehealth: Payer: Self-pay

## 2020-09-10 NOTE — Telephone Encounter (Signed)
Patient returning call lab results. Call back # [phone]

## 2020-09-11 ENCOUNTER — Encounter: Payer: Self-pay | Admitting: Orthopedic Surgery

## 2020-09-11 ENCOUNTER — Other Ambulatory Visit: Payer: Self-pay

## 2020-09-11 ENCOUNTER — Ambulatory Visit (INDEPENDENT_AMBULATORY_CARE_PROVIDER_SITE_OTHER): Payer: 59 | Admitting: Orthopedic Surgery

## 2020-09-11 DIAGNOSIS — M1711 Unilateral primary osteoarthritis, right knee: Secondary | ICD-10-CM

## 2020-09-11 DIAGNOSIS — G8929 Other chronic pain: Secondary | ICD-10-CM

## 2020-09-11 NOTE — Patient Instructions (Signed)
CALL IN 6 MOS TO ORDER EUFLEXA AGAIN

## 2020-09-11 NOTE — Telephone Encounter (Signed)
Pt informed

## 2020-09-11 NOTE — Progress Notes (Signed)
FOLLOW UP   Encounter Diagnoses  Name Primary?   Chronic pain of right knee Yes   Primary osteoarthritis of right knee      Chief Complaint  Patient presents with   Injections    Right knee 3rd euflexxa     This is the third Euflexxa injection in the right knee for the sore  So far so good patient reports major improvement in her symptoms of pain swelling and range of motion functional improvement in terms of walking as well  Right knee confirmed as injection site Euflexxa confirmed as medication for injection right knee  Patient consented verbally.  Euflexxa vial was injected right knee lateral approach no complications patient will call in 6 months to give [place] a 2-week notice to order medication again if symptoms warrant

## 2020-09-19 ENCOUNTER — Other Ambulatory Visit (HOSPITAL_COMMUNITY): Payer: Self-pay

## 2020-09-19 MED FILL — Gabapentin Cap 400 MG: ORAL | 90 days supply | Qty: 180 | Fill #1 | Status: AC

## 2020-09-26 ENCOUNTER — Other Ambulatory Visit (HOSPITAL_COMMUNITY): Payer: Self-pay

## 2020-10-07 ENCOUNTER — Other Ambulatory Visit: Payer: Self-pay | Admitting: Family Medicine

## 2020-10-07 ENCOUNTER — Other Ambulatory Visit (HOSPITAL_COMMUNITY): Payer: Self-pay

## 2020-10-07 MED ORDER — CYCLOBENZAPRINE HCL 10 MG PO TABS
10.0000 mg | ORAL_TABLET | Freq: Three times a day (TID) | ORAL | 2 refills | Status: DC | PRN
  Filled 2020-10-07: qty 30, 10d supply, fill #0
  Filled 2020-10-29: qty 30, 10d supply, fill #1
  Filled 2020-11-18: qty 30, 10d supply, fill #2

## 2020-10-10 ENCOUNTER — Other Ambulatory Visit (INDEPENDENT_AMBULATORY_CARE_PROVIDER_SITE_OTHER): Payer: Self-pay | Admitting: Gastroenterology

## 2020-10-10 ENCOUNTER — Other Ambulatory Visit (HOSPITAL_COMMUNITY): Payer: Self-pay

## 2020-10-10 DIAGNOSIS — R1311 Dysphagia, oral phase: Secondary | ICD-10-CM

## 2020-10-10 DIAGNOSIS — G43719 Chronic migraine without aura, intractable, without status migrainosus: Secondary | ICD-10-CM | POA: Diagnosis not present

## 2020-10-10 DIAGNOSIS — K219 Gastro-esophageal reflux disease without esophagitis: Secondary | ICD-10-CM

## 2020-10-10 DIAGNOSIS — M791 Myalgia, unspecified site: Secondary | ICD-10-CM | POA: Diagnosis not present

## 2020-10-10 DIAGNOSIS — M542 Cervicalgia: Secondary | ICD-10-CM | POA: Diagnosis not present

## 2020-10-10 MED ORDER — ESOMEPRAZOLE MAGNESIUM 40 MG PO CPDR
40.0000 mg | DELAYED_RELEASE_CAPSULE | Freq: Every day | ORAL | 0 refills | Status: DC
  Filled 2020-10-10 (×2): qty 15, 15d supply, fill #0

## 2020-10-24 ENCOUNTER — Other Ambulatory Visit (HOSPITAL_COMMUNITY): Payer: Self-pay

## 2020-10-24 DIAGNOSIS — M545 Low back pain, unspecified: Secondary | ICD-10-CM | POA: Diagnosis not present

## 2020-10-24 DIAGNOSIS — M79672 Pain in left foot: Secondary | ICD-10-CM | POA: Diagnosis not present

## 2020-10-24 DIAGNOSIS — G8929 Other chronic pain: Secondary | ICD-10-CM | POA: Diagnosis not present

## 2020-10-24 DIAGNOSIS — Z79891 Long term (current) use of opiate analgesic: Secondary | ICD-10-CM | POA: Diagnosis not present

## 2020-10-24 DIAGNOSIS — G4700 Insomnia, unspecified: Secondary | ICD-10-CM | POA: Diagnosis not present

## 2020-10-24 DIAGNOSIS — M25579 Pain in unspecified ankle and joints of unspecified foot: Secondary | ICD-10-CM | POA: Diagnosis not present

## 2020-10-24 DIAGNOSIS — M25539 Pain in unspecified wrist: Secondary | ICD-10-CM | POA: Diagnosis not present

## 2020-10-24 DIAGNOSIS — M25569 Pain in unspecified knee: Secondary | ICD-10-CM | POA: Diagnosis not present

## 2020-10-24 MED ORDER — HYDROXYZINE HCL 25 MG PO TABS
25.0000 mg | ORAL_TABLET | Freq: Every day | ORAL | 1 refills | Status: DC
  Filled 2020-10-24: qty 30, 30d supply, fill #0

## 2020-10-24 MED ORDER — DULOXETINE HCL 60 MG PO CPEP: 60.0000 mg | ORAL_CAPSULE | Freq: Every day | ORAL | 0 refills | Status: DC

## 2020-10-24 MED ORDER — HYDROCODONE-ACETAMINOPHEN 7.5-325 MG PO TABS
1.0000 | ORAL_TABLET | Freq: Three times a day (TID) | ORAL | 0 refills | Status: DC | PRN
  Filled 2020-11-25: qty 90, 30d supply, fill #0

## 2020-10-24 MED ORDER — HYDROCODONE-ACETAMINOPHEN 7.5-325 MG PO TABS
1.0000 | ORAL_TABLET | Freq: Three times a day (TID) | ORAL | 0 refills | Status: DC | PRN
  Filled 2020-10-24: qty 90, 30d supply, fill #0

## 2020-10-29 ENCOUNTER — Other Ambulatory Visit (HOSPITAL_COMMUNITY): Payer: Self-pay

## 2020-11-18 ENCOUNTER — Other Ambulatory Visit (INDEPENDENT_AMBULATORY_CARE_PROVIDER_SITE_OTHER): Payer: Self-pay | Admitting: Gastroenterology

## 2020-11-18 ENCOUNTER — Other Ambulatory Visit (HOSPITAL_COMMUNITY): Payer: Self-pay

## 2020-11-18 ENCOUNTER — Telehealth (INDEPENDENT_AMBULATORY_CARE_PROVIDER_SITE_OTHER): Payer: Self-pay | Admitting: *Deleted

## 2020-11-18 ENCOUNTER — Telehealth: Payer: Self-pay

## 2020-11-18 DIAGNOSIS — K219 Gastro-esophageal reflux disease without esophagitis: Secondary | ICD-10-CM

## 2020-11-18 DIAGNOSIS — R1311 Dysphagia, oral phase: Secondary | ICD-10-CM

## 2020-11-18 MED ORDER — ESOMEPRAZOLE MAGNESIUM 40 MG PO CPDR
40.0000 mg | DELAYED_RELEASE_CAPSULE | Freq: Every day | ORAL | 0 refills | Status: DC
  Filled 2020-11-18: qty 30, 30d supply, fill #0

## 2020-11-18 NOTE — Telephone Encounter (Signed)
Left message

## 2020-11-18 NOTE — Telephone Encounter (Signed)
Pt needs office visit for refills. Please call and schedule. Thanks

## 2020-11-18 NOTE — Telephone Encounter (Signed)
Patient called said this medicine esomeprazole (NEXIUM) 40 MG capsule was denied, was just seen for her physical with Donneta Romberg instead of Dr Moshe Cipro on 06.30.2022.  call back # [phone]

## 2020-11-20 ENCOUNTER — Other Ambulatory Visit (HOSPITAL_COMMUNITY): Payer: Self-pay

## 2020-11-20 ENCOUNTER — Other Ambulatory Visit: Payer: Self-pay

## 2020-11-20 DIAGNOSIS — K219 Gastro-esophageal reflux disease without esophagitis: Secondary | ICD-10-CM

## 2020-11-20 DIAGNOSIS — R1311 Dysphagia, oral phase: Secondary | ICD-10-CM

## 2020-11-20 MED ORDER — ESOMEPRAZOLE MAGNESIUM 40 MG PO CPDR
40.0000 mg | DELAYED_RELEASE_CAPSULE | Freq: Every day | ORAL | 1 refills | Status: DC
  Filled 2020-11-20 – 2020-12-23 (×2): qty 90, 90d supply, fill #0

## 2020-11-20 NOTE — Telephone Encounter (Signed)
Rx sent 

## 2020-11-21 ENCOUNTER — Other Ambulatory Visit (HOSPITAL_COMMUNITY): Payer: Self-pay

## 2020-11-22 ENCOUNTER — Other Ambulatory Visit (HOSPITAL_COMMUNITY): Payer: Self-pay

## 2020-11-25 ENCOUNTER — Other Ambulatory Visit (HOSPITAL_COMMUNITY): Payer: Self-pay

## 2020-11-26 ENCOUNTER — Other Ambulatory Visit (HOSPITAL_COMMUNITY): Payer: Self-pay

## 2020-11-27 ENCOUNTER — Other Ambulatory Visit (HOSPITAL_COMMUNITY): Payer: Self-pay

## 2020-11-28 DIAGNOSIS — G43719 Chronic migraine without aura, intractable, without status migrainosus: Secondary | ICD-10-CM | POA: Diagnosis not present

## 2020-11-28 DIAGNOSIS — M542 Cervicalgia: Secondary | ICD-10-CM | POA: Diagnosis not present

## 2020-11-28 DIAGNOSIS — M791 Myalgia, unspecified site: Secondary | ICD-10-CM | POA: Diagnosis not present

## 2020-12-10 ENCOUNTER — Other Ambulatory Visit: Payer: Self-pay | Admitting: Family Medicine

## 2020-12-10 ENCOUNTER — Other Ambulatory Visit (HOSPITAL_COMMUNITY): Payer: Self-pay

## 2020-12-10 MED ORDER — CYCLOBENZAPRINE HCL 10 MG PO TABS
10.0000 mg | ORAL_TABLET | Freq: Three times a day (TID) | ORAL | 2 refills | Status: DC | PRN
  Filled 2020-12-10: qty 30, 10d supply, fill #0
  Filled 2021-01-16: qty 30, 10d supply, fill #1
  Filled 2021-02-10: qty 30, 10d supply, fill #2

## 2020-12-19 ENCOUNTER — Other Ambulatory Visit (HOSPITAL_COMMUNITY): Payer: Self-pay

## 2020-12-19 DIAGNOSIS — G8929 Other chronic pain: Secondary | ICD-10-CM | POA: Diagnosis not present

## 2020-12-19 DIAGNOSIS — Z79891 Long term (current) use of opiate analgesic: Secondary | ICD-10-CM | POA: Diagnosis not present

## 2020-12-19 DIAGNOSIS — M25539 Pain in unspecified wrist: Secondary | ICD-10-CM | POA: Diagnosis not present

## 2020-12-19 DIAGNOSIS — G4700 Insomnia, unspecified: Secondary | ICD-10-CM | POA: Diagnosis not present

## 2020-12-19 DIAGNOSIS — M545 Low back pain, unspecified: Secondary | ICD-10-CM | POA: Diagnosis not present

## 2020-12-19 DIAGNOSIS — M25569 Pain in unspecified knee: Secondary | ICD-10-CM | POA: Diagnosis not present

## 2020-12-19 DIAGNOSIS — M79672 Pain in left foot: Secondary | ICD-10-CM | POA: Diagnosis not present

## 2020-12-19 DIAGNOSIS — M25579 Pain in unspecified ankle and joints of unspecified foot: Secondary | ICD-10-CM | POA: Diagnosis not present

## 2020-12-19 MED ORDER — HYDROCODONE-ACETAMINOPHEN 7.5-325 MG PO TABS
1.0000 | ORAL_TABLET | Freq: Three times a day (TID) | ORAL | 0 refills | Status: DC | PRN
  Filled 2020-12-25: qty 90, 30d supply, fill #0

## 2020-12-19 MED ORDER — HYDROCODONE-ACETAMINOPHEN 7.5-325 MG PO TABS
1.0000 | ORAL_TABLET | Freq: Three times a day (TID) | ORAL | 0 refills | Status: DC | PRN
  Filled 2021-01-27: qty 90, 30d supply, fill #0

## 2020-12-19 MED ORDER — HYDROXYZINE HCL 25 MG PO TABS
25.0000 mg | ORAL_TABLET | Freq: Every day | ORAL | 1 refills | Status: DC
  Filled 2020-12-19: qty 30, 30d supply, fill #0

## 2020-12-19 MED ORDER — DULOXETINE HCL 60 MG PO CPEP
60.0000 mg | ORAL_CAPSULE | Freq: Every day | ORAL | 0 refills | Status: DC
  Filled 2020-12-19: qty 90, 90d supply, fill #0

## 2020-12-23 ENCOUNTER — Other Ambulatory Visit (HOSPITAL_COMMUNITY): Payer: Self-pay

## 2020-12-23 MED FILL — Gabapentin Cap 400 MG: ORAL | 90 days supply | Qty: 180 | Fill #2 | Status: AC

## 2020-12-25 ENCOUNTER — Other Ambulatory Visit (HOSPITAL_COMMUNITY): Payer: Self-pay

## 2020-12-26 ENCOUNTER — Other Ambulatory Visit (HOSPITAL_COMMUNITY): Payer: Self-pay

## 2021-01-02 ENCOUNTER — Other Ambulatory Visit: Payer: Self-pay

## 2021-01-02 ENCOUNTER — Other Ambulatory Visit (HOSPITAL_COMMUNITY): Payer: Self-pay

## 2021-01-02 ENCOUNTER — Encounter (INDEPENDENT_AMBULATORY_CARE_PROVIDER_SITE_OTHER): Payer: Self-pay | Admitting: Gastroenterology

## 2021-01-02 ENCOUNTER — Ambulatory Visit (INDEPENDENT_AMBULATORY_CARE_PROVIDER_SITE_OTHER): Payer: 59 | Admitting: Gastroenterology

## 2021-01-02 VITALS — BP 129/85 | HR 102 | Temp 98.9°F | Ht 62.0 in | Wt 178.0 lb

## 2021-01-02 DIAGNOSIS — K219 Gastro-esophageal reflux disease without esophagitis: Secondary | ICD-10-CM | POA: Diagnosis not present

## 2021-01-02 DIAGNOSIS — K921 Melena: Secondary | ICD-10-CM

## 2021-01-02 DIAGNOSIS — R1311 Dysphagia, oral phase: Secondary | ICD-10-CM | POA: Diagnosis not present

## 2021-01-02 MED ORDER — ESOMEPRAZOLE MAGNESIUM 40 MG PO CPDR
40.0000 mg | DELAYED_RELEASE_CAPSULE | Freq: Every day | ORAL | 3 refills | Status: DC
Start: 2021-01-02 — End: 2022-10-22
  Filled 2021-01-02 – 2021-03-31 (×2): qty 90, 90d supply, fill #0
  Filled 2021-07-08: qty 90, 90d supply, fill #1
  Filled 2021-11-04: qty 90, 90d supply, fill #2

## 2021-01-02 NOTE — Patient Instructions (Signed)
We will proceed with colonoscopy.  Please try to cut back/stop goody powder as these are very damaging to your GI tract, especially in the high doses you are taking.  I will send refill of your nexium to continue taking daily   Please let me know if you have worsening or more frequent abdominal pain or rectal bleeding.  Follow up in 1-2 months, after colonoscopy

## 2021-01-02 NOTE — Progress Notes (Signed)
Referring Provider: Fayrene Helper, MD Primary Care Physician:  Fayrene Helper, MD Primary GI Physician: Laural Golden  Chief Complaint  Patient presents with   Gastroesophageal Reflux    Follow up on gerd. Having some abdominal cramping after eating sometimes. Started about one month ago. States it is diarrhea and blood. Wonders if it is related to her IUD. Had put in at age 55.    HPI:   Zoe Bailey is a 55 y.o. female with past medical history of arthritis, migraines, depression, COPD, sleep apnea, HTN.   Patient presenting today for follow up of GERD as she has not been seen by [place] since Oct 2020. She is currently on nexium 40mg /day. Doing well on this. Having no issues with reflux, acid regurgitation, dysphagia, odynophagia, cough, sore throat or hoarse voice.   Patient states that about 1 month ago, she began having lower abdominal cramping that occurs 3-4x/week. She will feel the need to defecate, she will go to have a BM which will be normal at first, however, she will continue having cramping and next BMs will be diarrhea x3-4 episodes, at the end she will have a BM that is pure blood without stool. She states that she has kept track of her foods and her symptoms do not seem to change based on what she eats. Has not tried anything for her abdominal cramping.   She denies any melena, weight loss, early satiety. Denies nausea or vomiting.    She is on chronic pain meds. She takes hydrocodone 7.5mg  TID.  She reports she takes Goody powder 8-10/day for knee pain. She has been taking this amount for a while, maybe 3 years.   Social hx: no etoh, 1/2 PPD Fam hx: no CRC or tobacco  Last Colonoscopy:05/07/17- Diverticulosis in the sigmoid colon. - External hemorrhoids. No specimens collected Last Endoscopy:03/06/19- Normal esophagus. - Z-line regular, 36 cm from the incisors. - 2 cm hiatal hernia. - No endoscopic esophageal abnormality to explain patient's dysphagia. Esophagus  dilated. Dilated. - Erosive gastropathy with no stigmata of recent bleeding. Biopsied. - Normal duodenal bulb and second portion of the duodenum.  Recommendations:  Repeat colonoscopy March 2029  Past Medical History:  Diagnosis Date   Arthritis    Cancer [facility]) 2010 approx   skin cancer   Complication of anesthesia    Wakes up during all surgeries   COPD (chronic obstructive pulmonary disease) ([facility])    Depression    Dysphagia 12/22/2018   Foot pain, right 03/2014   Hemorrhoid 08/11/2017   Menometrorrhagia 07/14/2013   Migraine    USES BOTOX FOR TREATMENT    Oral phase dysphagia 01/24/2019   Added automatically from request for surgery [id number]   Polymenorrhea 07/11/2013   Started two cycles per month since 03/2013   Shortness of breath dyspnea    Sleep apnea    Toenail fungus     Past Surgical History:  Procedure Laterality Date   ANKLE FRACTURE SURGERY Right    BREAST ENHANCEMENT SURGERY     CARPAL TUNNEL RELEASE Right    x2   CHONDROPLASTY Right 02/17/2018   Procedure: CHONDROPLASTY;  Surgeon: Paralee Cancel, MD;  Location: [facility];  Service: Orthopedics;  Laterality: Right;   COLONOSCOPY WITH PROPOFOL N/A 05/07/2017   Procedure: COLONOSCOPY WITH PROPOFOL;  Surgeon: Rogene Houston, MD;  Location: [facility];  Service: Endoscopy;  Laterality: N/A;  8:30   ESOPHAGOGASTRODUODENOSCOPY (EGD) WITH PROPOFOL N/A 03/06/2019   Procedure: ESOPHAGOGASTRODUODENOSCOPY (EGD) WITH PROPOFOL;  Surgeon: Rogene Houston, MD;  Location: [facility];  Service: Endoscopy;  Laterality: N/A;   HERNIA REPAIR     umbilical   KNEE ARTHROSCOPY WITH MEDIAL MENISECTOMY Right 02/17/2018   Procedure: KNEE ARTHROSCOPY WITH MEDIAL MENISECTOMY;  Surgeon: Paralee Cancel, MD;  Location: [facility];  Service: Orthopedics;  Laterality: Right;   MALONEY DILATION  03/06/2019   Procedure: MALONEY DILATION;  Surgeon: Rogene Houston, MD;  Location: [facility];  Service: Endoscopy;;   NASAL POLYP SURGERY      POLYPECTOMY      Current Outpatient Medications  Medication Sig Dispense Refill   amLODipine (NORVASC) 5 MG tablet TAKE 1 TABLET (5 MG TOTAL) BY MOUTH DAILY. 90 tablet 3   botulinum toxin Type A (BOTOX) 100 units SOLR injection 200 units to be injected intramuscularly in the head,neck & trunk area every 12 weeks 2 each 3   busPIRone (BUSPAR) 10 MG tablet TAKE 1 TABLET BY MOUTH 3 TIMES DAILY. 90 tablet 3   cyclobenzaprine (FLEXERIL) 10 MG tablet Take 1 tablet (10 mg total) by mouth 3 (three) times daily as needed for muscle spasms. 30 tablet 2   diclofenac (VOLTAREN) 75 MG EC tablet Take 1 tablet (75 mg total) by mouth daily as needed for pain 30 tablet 2   DULoxetine (CYMBALTA) 60 MG capsule Take 1 capsule (60 mg total) by mouth at bedtime. 90 capsule 0   esomeprazole (NEXIUM) 40 MG capsule Take 1 capsule (40 mg total) by mouth daily before breakfast. 90 capsule 1   gabapentin (NEURONTIN) 400 MG capsule TAKE 2 CAPSULES BY MOUTH EVERY MORNING FOR MIGRAINE PREVENTION 180 capsule 2   HYDROcodone-acetaminophen (NORCO) 7.5-325 MG tablet Take 1 tablet by mouth 3 (three) times daily as needed. 90 tablet 0   HYDROcodone-acetaminophen (NORCO) 7.5-325 MG tablet Take 1 tablet by mouth 3 (three) times daily as needed. 90 tablet 0   HYDROcodone-acetaminophen (NORCO) 7.5-325 MG tablet Take 1 tablet by mouth 3 (three) times daily as needed. 90 tablet 0   [START ON 01/24/2021] HYDROcodone-acetaminophen (NORCO) 7.5-325 MG tablet Take 1 tablet by mouth 3 (three) times daily as needed. Do not fill before 01-24-21 90 tablet 0   hydrOXYzine (ATARAX/VISTARIL) 25 MG tablet Take 1 tablet (25 mg total) by mouth at bedtime. 30 tablet 1   tiotropium (SPIRIVA HANDIHALER) 18 MCG inhalation capsule Place 1 capsule (18 mcg total) into inhaler and inhale daily for 30 days. (Patient taking differently: Place 18 mcg into inhaler and inhale daily as needed (shortness of breath).) 30 capsule 3   triamterene-hydrochlorothiazide  (MAXZIDE) 75-50 MG tablet TAKE 1 TABLET BY MOUTH DAILY. 90 tablet 3   UNABLE TO FIND Blood pressure monitor x 1  DX I10 1 each 0   No current facility-administered medications for this visit.    Allergies as of 01/02/2021 - Review Complete 01/02/2021  Allergen Reaction Noted   Effexor [venlafaxine] Other (See Comments) 11/20/2013   Other Other (See Comments) 02/08/2018    Family History  Problem Relation Age of Onset   Arthritis Other    Cancer Other     Social History   Socioeconomic History   Marital status: Divorced    Spouse name: Not on file   Number of children: 1   Years of education: college   Highest education level: Not on file  Occupational History   Occupation: TEFL teacher for purchasing    Employer: [employer]  Tobacco Use   Smoking status: Every Day  Packs/day: 0.25    Years: 33.00    Pack years: 8.25    Types: Cigarettes   Smokeless tobacco: Never  Vaping Use   Vaping Use: Never used  Substance and Sexual Activity   Alcohol use: No    Alcohol/week: 0.0 standard drinks   Drug use: No   Sexual activity: Yes    Birth control/protection: None  Other Topics Concern   Not on file  Social History Narrative   Divorced. Lives alone. Has 2 dogs   Social Determinants of Radio broadcast assistant Strain: Not on file  Food Insecurity: Not on file  Transportation Needs: Not on file  Physical Activity: Not on file  Stress: Not on file  Social Connections: Not on file   Review of systems General: negative for malaise, night sweats, fever, chills, weight los Neck: Negative for lumps, goiter, pain and significant neck swelling Resp: Negative for cough, wheezing, dyspnea at rest CV: Negative for chest pain, leg swelling, palpitations, orthopnea GI: denies melena, nausea, vomiting, constipation, dysphagia, odyonophagia, early satiety or unintentional weight loss. +diarrhea, +hematochezia MSK: Negative for joint pain or swelling, back pain, and  muscle pain. Derm: Negative for itching or rash Psych: Denies depression, anxiety, memory loss, confusion. No homicidal or suicidal ideation.  Heme: Negative for prolonged bleeding, bruising easily, and swollen nodes. Endocrine: Negative for cold or heat intolerance, polyuria, polydipsia and goiter. Neuro: negative for tremor, gait imbalance, syncope and seizures. The remainder of the review of systems is noncontributory.  Physical Exam: BP 129/85 (BP Location: Right Arm, Patient Position: Sitting, Cuff Size: Large)   Pulse (!) 102   Temp 98.9 F (37.2 C) (Oral)   Ht 5\' 2"  (1.575 m)   Wt 178 lb (80.7 kg)   LMP 08/21/2017 (Approximate) Comment: preg. test negative  BMI 32.56 kg/m  General:   Alert and oriented. No distress noted. Pleasant and cooperative.  Head:  Normocephalic and atraumatic. Eyes:  Conjuctiva clear without scleral icterus. Mouth:  Oral mucosa pink and moist. Good dentition. No lesions. Heart: Normal rate and rhythm, s1 and s2 heart sounds present.  Lungs: Clear lung sounds in all lobes. Respirations equal and unlabored. Abdomen:  +BS, soft, non-tender and non-distended. No rebound or guarding. No HSM or masses noted. Derm: No palmar erythema or jaundice Msk:  Symmetrical without gross deformities. Normal posture. Extremities:  Without edema. Neurologic:  Alert and  oriented x4 Psych:  Alert and cooperative. Normal mood and affect.  Invalid input(s): 6 MONTHS   ASSESSMENT: Zoe Bailey is a 55 y.o. female presenting today for follow up of GERD, also having issues with lower abdominal pain and rectal bleeding.  Doing well on nexium 40mg  without breakthrough reflux, we will continue with current regimen.  For the past month, she has had lower diffuse abdominal cramping after eating that occurs 3-4x/week with initial BM normal then changing to diarrhea then final BM usually consists of only BRBPR without any stool. She denies melena, weight loss, early satiety. She  takes heavy doses of Goody powder daily, she was counseled on negative GI affects of heavy/frequent NSAID use and cautioned on side effects. We discussed the importance of stopping high dose/frequency NSAIDs. Abdominal exam was benign. We will proceed with colonoscopy for further evaluation of her rectal bleeding as we cannot rule out malignancy, and she is at a higher risk of colitis/bleeding AVMs. Will consider CT imagining if colonoscopy is unremarkable.    PLAN:  Continue nexium 40mg  daily 2. Proceed with  colonoscopy 3. Cessation of goody powder is imperative 4. Consider CT abdominal imagining if Colonoscopy unremarkable  Follow Up: 1-2 months, after TCS  Deliliah Spranger L. Alver Sorrow, MSN, APRN, AGNP-C Adult-Gerontology Nurse Practitioner [facility]

## 2021-01-07 DIAGNOSIS — M791 Myalgia, unspecified site: Secondary | ICD-10-CM | POA: Diagnosis not present

## 2021-01-07 DIAGNOSIS — M542 Cervicalgia: Secondary | ICD-10-CM | POA: Diagnosis not present

## 2021-01-07 DIAGNOSIS — G43719 Chronic migraine without aura, intractable, without status migrainosus: Secondary | ICD-10-CM | POA: Diagnosis not present

## 2021-01-08 ENCOUNTER — Other Ambulatory Visit (HOSPITAL_COMMUNITY): Payer: Self-pay

## 2021-01-08 ENCOUNTER — Telehealth (INDEPENDENT_AMBULATORY_CARE_PROVIDER_SITE_OTHER): Payer: Self-pay

## 2021-01-08 ENCOUNTER — Other Ambulatory Visit (INDEPENDENT_AMBULATORY_CARE_PROVIDER_SITE_OTHER): Payer: Self-pay

## 2021-01-08 DIAGNOSIS — K921 Melena: Secondary | ICD-10-CM

## 2021-01-08 DIAGNOSIS — R109 Unspecified abdominal pain: Secondary | ICD-10-CM

## 2021-01-08 MED ORDER — PEG 3350-KCL-NA BICARB-NACL 420 G PO SOLR
4000.0000 mL | ORAL | 0 refills | Status: DC
  Filled 2021-01-08: qty 4000, 1d supply, fill #0

## 2021-01-08 NOTE — Telephone Encounter (Signed)
LeighAnn Ainslee Sou, CMA  

## 2021-01-09 ENCOUNTER — Encounter (INDEPENDENT_AMBULATORY_CARE_PROVIDER_SITE_OTHER): Payer: Self-pay

## 2021-01-13 ENCOUNTER — Other Ambulatory Visit (HOSPITAL_COMMUNITY): Payer: Self-pay

## 2021-01-16 ENCOUNTER — Other Ambulatory Visit (HOSPITAL_COMMUNITY): Payer: Self-pay

## 2021-01-16 ENCOUNTER — Other Ambulatory Visit: Payer: Self-pay | Admitting: Family Medicine

## 2021-01-16 MED ORDER — BUSPIRONE HCL 10 MG PO TABS
10.0000 mg | ORAL_TABLET | Freq: Three times a day (TID) | ORAL | 3 refills | Status: DC
  Filled 2021-01-16: qty 90, 30d supply, fill #0
  Filled 2021-04-07: qty 90, 30d supply, fill #1
  Filled 2021-06-27: qty 90, 30d supply, fill #2

## 2021-01-16 NOTE — Patient Instructions (Signed)
   Your procedure is scheduled on: 01/22/2021  Report to [facility] at    8:30 AM.  Call this number if you have problems the morning of surgery: [phone]   Remember:              Follow Directions on the letter you received from Your Physician's office regarding the Bowel Prep              No Smoking the day of Procedure :   Take these medicines the morning of surgery with A SIP OF WATER: cymbalta, gabapentin, esomeprazole, amlodipine, buspar, flexeril and hydrocodone if needed   Do not wear jewelry, make-up or nail polish.    Do not bring valuables to the hospital.  Contacts, dentures or bridgework may not be worn into surgery.  .   Patients discharged the day of surgery will not be allowed to drive home.     Colonoscopy, Adult, Care After This sheet gives you information about how to care for yourself after your procedure. Your health care provider may also give you more specific instructions. If you have problems or questions, contact your health care provider. What can I expect after the procedure? After the procedure, it is common to have: A small amount of blood in your stool for 24 hours after the procedure. Some gas. Mild abdominal cramping or bloating.  Follow these instructions at home: General instructions  For the first 24 hours after the procedure: Do not drive or use machinery. Do not sign important documents. Do not drink alcohol. Do your regular daily activities at a slower pace than normal. Eat soft, easy-to-digest foods. Rest often. Take over-the-counter or prescription medicines only as told by your health care provider. It is up to you to get the results of your procedure. Ask your health care provider, or the department performing the procedure, when your results will be ready. Relieving cramping and bloating Try walking around when you have cramps or feel bloated. Apply heat to your abdomen as told by your health care provider. Use a heat source  that your health care provider recommends, such as a moist heat pack or a heating pad. Place a towel between your skin and the heat source. Leave the heat on for 20-30 minutes. Remove the heat if your skin turns bright red. This is especially important if you are unable to feel pain, heat, or cold. You may have a greater risk of getting burned. Eating and drinking Drink enough fluid to keep your urine clear or pale yellow. Resume your normal diet as instructed by your health care provider. Avoid heavy or fried foods that are hard to digest. Avoid drinking alcohol for as long as instructed by your health care provider. Contact a health care provider if: You have blood in your stool 2-3 days after the procedure. Get help right away if: You have more than a small spotting of blood in your stool. You pass large blood clots in your stool. Your abdomen is swollen. You have nausea or vomiting. You have a fever. You have increasing abdominal pain that is not relieved with medicine. This information is not intended to replace advice given to you by your health care provider. Make sure you discuss any questions you have with your health care provider. Document Released: 10/08/2003 Document Revised: 11/18/2015 Document Reviewed: 05/07/2015 Elsevier Interactive Patient Education  [employer].

## 2021-01-20 ENCOUNTER — Encounter (HOSPITAL_COMMUNITY)
Admission: RE | Admit: 2021-01-20 | Discharge: 2021-01-20 | Disposition: A | Discharge status: Home / Self Care | Payer: 59 | Source: Ambulatory Visit | Attending: Internal Medicine | Admitting: Internal Medicine

## 2021-01-20 DIAGNOSIS — Z79899 Other long term (current) drug therapy: Secondary | ICD-10-CM

## 2021-01-21 ENCOUNTER — Other Ambulatory Visit (INDEPENDENT_AMBULATORY_CARE_PROVIDER_SITE_OTHER): Payer: Self-pay

## 2021-01-21 ENCOUNTER — Encounter (INDEPENDENT_AMBULATORY_CARE_PROVIDER_SITE_OTHER): Payer: Self-pay

## 2021-01-22 ENCOUNTER — Other Ambulatory Visit (HOSPITAL_COMMUNITY): Payer: Self-pay

## 2021-01-27 ENCOUNTER — Other Ambulatory Visit: Payer: Self-pay | Admitting: Family Medicine

## 2021-01-27 ENCOUNTER — Other Ambulatory Visit (HOSPITAL_COMMUNITY): Payer: Self-pay

## 2021-01-27 MED ORDER — DICLOFENAC SODIUM 75 MG PO TBEC
75.0000 mg | DELAYED_RELEASE_TABLET | Freq: Every day | ORAL | 2 refills | Status: DC | PRN
  Filled 2021-01-27: qty 30, 30d supply, fill #0
  Filled 2021-03-17: qty 30, 30d supply, fill #1
  Filled 2021-04-24: qty 30, 30d supply, fill #2

## 2021-02-10 ENCOUNTER — Other Ambulatory Visit (HOSPITAL_COMMUNITY): Payer: Self-pay

## 2021-02-20 ENCOUNTER — Other Ambulatory Visit (HOSPITAL_COMMUNITY): Payer: Self-pay

## 2021-02-21 ENCOUNTER — Other Ambulatory Visit (HOSPITAL_COMMUNITY): Payer: Self-pay

## 2021-02-24 ENCOUNTER — Other Ambulatory Visit (HOSPITAL_COMMUNITY): Payer: Self-pay

## 2021-02-24 ENCOUNTER — Other Ambulatory Visit: Payer: Self-pay | Admitting: Family Medicine

## 2021-02-24 MED ORDER — CYCLOBENZAPRINE HCL 10 MG PO TABS
10.0000 mg | ORAL_TABLET | Freq: Three times a day (TID) | ORAL | 2 refills | Status: DC | PRN
  Filled 2021-02-24: qty 30, 10d supply, fill #0
  Filled 2021-03-17: qty 30, 10d supply, fill #1
  Filled 2021-04-07: qty 30, 10d supply, fill #2

## 2021-02-24 MED ORDER — AMLODIPINE BESYLATE 5 MG PO TABS
5.0000 mg | ORAL_TABLET | Freq: Every day | ORAL | 3 refills | Status: DC
  Filled 2021-02-24: qty 90, 90d supply, fill #0
  Filled 2021-06-09: qty 90, 90d supply, fill #1

## 2021-02-25 ENCOUNTER — Other Ambulatory Visit (HOSPITAL_COMMUNITY): Payer: Self-pay

## 2021-02-25 MED ORDER — HYDROCODONE-ACETAMINOPHEN 7.5-325 MG PO TABS
1.0000 | ORAL_TABLET | Freq: Three times a day (TID) | ORAL | 0 refills | Status: DC | PRN
  Filled 2021-02-25: qty 90, 30d supply, fill #0

## 2021-02-26 ENCOUNTER — Other Ambulatory Visit (HOSPITAL_COMMUNITY): Payer: Self-pay

## 2021-02-27 ENCOUNTER — Encounter (HOSPITAL_COMMUNITY): Payer: 59

## 2021-02-27 ENCOUNTER — Encounter (HOSPITAL_COMMUNITY): Payer: Self-pay

## 2021-02-27 DIAGNOSIS — M542 Cervicalgia: Secondary | ICD-10-CM | POA: Diagnosis not present

## 2021-02-27 DIAGNOSIS — G43719 Chronic migraine without aura, intractable, without status migrainosus: Secondary | ICD-10-CM | POA: Diagnosis not present

## 2021-02-27 DIAGNOSIS — M791 Myalgia, unspecified site: Secondary | ICD-10-CM | POA: Diagnosis not present

## 2021-03-05 ENCOUNTER — Encounter (HOSPITAL_COMMUNITY): Admission: RE | Payer: Self-pay | Source: Home / Self Care

## 2021-03-05 ENCOUNTER — Ambulatory Visit (HOSPITAL_COMMUNITY): Admission: RE | Admit: 2021-03-05 | Payer: 59 | Source: Home / Self Care | Admitting: Internal Medicine

## 2021-03-05 SURGERY — COLONOSCOPY WITH PROPOFOL
Anesthesia: Monitor Anesthesia Care

## 2021-03-06 ENCOUNTER — Telehealth: Payer: Self-pay | Admitting: Orthopedic Surgery

## 2021-03-06 DIAGNOSIS — M1711 Unilateral primary osteoarthritis, right knee: Secondary | ICD-10-CM

## 2021-03-06 NOTE — Telephone Encounter (Signed)
Voice message received from patient just prior to office closing - I returned call to notify patient that we received her message, and mail box is full. Patient's question is in reference to the Euflexxa injection medication for her upcoming appointment 03/17/21; states she was to call this week to check on. Please advise.

## 2021-03-07 NOTE — Telephone Encounter (Signed)
We will get meds, if they are not here / approved by appointment will let her know'

## 2021-03-13 ENCOUNTER — Ambulatory Visit: Payer: 59 | Admitting: Family Medicine

## 2021-03-17 ENCOUNTER — Other Ambulatory Visit (HOSPITAL_COMMUNITY): Payer: Self-pay

## 2021-03-17 ENCOUNTER — Ambulatory Visit: Payer: 59 | Admitting: Orthopedic Surgery

## 2021-03-17 ENCOUNTER — Telehealth: Payer: Self-pay | Admitting: Radiology

## 2021-03-17 NOTE — Telephone Encounter (Signed)
Meds not here yet for her injection I tried calling her cell but did not go through Tried her work left message.

## 2021-03-21 DIAGNOSIS — M25539 Pain in unspecified wrist: Secondary | ICD-10-CM | POA: Diagnosis not present

## 2021-03-21 DIAGNOSIS — G8929 Other chronic pain: Secondary | ICD-10-CM | POA: Diagnosis not present

## 2021-03-21 DIAGNOSIS — M25569 Pain in unspecified knee: Secondary | ICD-10-CM | POA: Diagnosis not present

## 2021-03-21 DIAGNOSIS — M545 Low back pain, unspecified: Secondary | ICD-10-CM | POA: Diagnosis not present

## 2021-03-21 DIAGNOSIS — M25579 Pain in unspecified ankle and joints of unspecified foot: Secondary | ICD-10-CM | POA: Diagnosis not present

## 2021-03-21 DIAGNOSIS — Z79891 Long term (current) use of opiate analgesic: Secondary | ICD-10-CM | POA: Diagnosis not present

## 2021-03-22 ENCOUNTER — Other Ambulatory Visit (HOSPITAL_COMMUNITY): Payer: Self-pay

## 2021-03-22 MED ORDER — HYDROXYZINE HCL 25 MG PO TABS
ORAL_TABLET | ORAL | 3 refills | Status: DC
  Filled 2021-03-22: qty 30, 30d supply, fill #0

## 2021-03-22 MED ORDER — DULOXETINE HCL 60 MG PO CPEP
60.0000 mg | ORAL_CAPSULE | Freq: Every day | ORAL | 3 refills | Status: DC
  Filled 2021-03-22: qty 90, 90d supply, fill #0

## 2021-03-22 MED ORDER — GABAPENTIN 400 MG PO CAPS
800.0000 mg | ORAL_CAPSULE | Freq: Every day | ORAL | 3 refills | Status: DC
  Filled 2021-03-22: qty 180, 90d supply, fill #0
  Filled 2021-07-08: qty 180, 90d supply, fill #1

## 2021-03-24 ENCOUNTER — Other Ambulatory Visit (HOSPITAL_COMMUNITY): Payer: Self-pay

## 2021-03-27 ENCOUNTER — Other Ambulatory Visit (HOSPITAL_COMMUNITY): Payer: Self-pay

## 2021-03-28 ENCOUNTER — Other Ambulatory Visit (HOSPITAL_COMMUNITY): Payer: Self-pay

## 2021-03-31 ENCOUNTER — Other Ambulatory Visit (HOSPITAL_COMMUNITY): Payer: Self-pay

## 2021-04-01 ENCOUNTER — Other Ambulatory Visit (HOSPITAL_COMMUNITY): Payer: Self-pay

## 2021-04-02 ENCOUNTER — Other Ambulatory Visit (HOSPITAL_COMMUNITY): Payer: Self-pay

## 2021-04-03 ENCOUNTER — Other Ambulatory Visit: Payer: Self-pay

## 2021-04-03 ENCOUNTER — Other Ambulatory Visit (HOSPITAL_COMMUNITY): Payer: Self-pay

## 2021-04-03 ENCOUNTER — Ambulatory Visit: Payer: 59 | Admitting: Orthopedic Surgery

## 2021-04-03 ENCOUNTER — Encounter: Payer: Self-pay | Admitting: Orthopedic Surgery

## 2021-04-03 DIAGNOSIS — M1711 Unilateral primary osteoarthritis, right knee: Secondary | ICD-10-CM | POA: Diagnosis not present

## 2021-04-03 NOTE — Progress Notes (Signed)
Chief Complaint  Patient presents with   Injections    Euflexxa 1 Right knee ABN signed sent to scan has lot number on ABN     Encounter Diagnosis  Name Primary?   Primary osteoarthritis of right knee Yes   Procedure note for injection of hyaluronic acid   Diagnosis osteoarthritis of the knee  Verbal consent was obtained to inject the knee with HYALURONIC ACID . Timeout was completed to confirm the injection site as the right    Knee  Ethyl chloride spray was used for anesthesia Alcohol was used to prep the skin. The infrapatellar lateral portal was used as an injection site and 1 vial of hyaluronic acid  was injected into the knee  Specific Co. Preparation: euflexa 1/3  No complications were noted

## 2021-04-04 ENCOUNTER — Other Ambulatory Visit (HOSPITAL_COMMUNITY): Payer: Self-pay

## 2021-04-04 MED ORDER — HYDROCODONE-ACETAMINOPHEN 7.5-325 MG PO TABS
1.0000 | ORAL_TABLET | Freq: Three times a day (TID) | ORAL | 0 refills | Status: DC | PRN
  Filled 2021-05-05: qty 90, 30d supply, fill #0

## 2021-04-04 MED ORDER — HYDROCODONE-ACETAMINOPHEN 7.5-325 MG PO TABS
1.0000 | ORAL_TABLET | Freq: Three times a day (TID) | ORAL | 0 refills | Status: DC | PRN
  Filled 2021-06-09: qty 90, 30d supply, fill #0

## 2021-04-04 MED ORDER — HYDROCODONE-ACETAMINOPHEN 7.5-325 MG PO TABS
1.0000 | ORAL_TABLET | Freq: Three times a day (TID) | ORAL | 0 refills | Status: DC | PRN
  Filled 2021-04-04: qty 90, 30d supply, fill #0

## 2021-04-04 MED ORDER — HYDROCODONE-ACETAMINOPHEN 10-325 MG PO TABS: 1.0000 | ORAL_TABLET | Freq: Three times a day (TID) | ORAL | 0 refills | Status: DC | PRN

## 2021-04-04 MED ORDER — HYDROXYZINE HCL 25 MG PO TABS
25.0000 mg | ORAL_TABLET | Freq: Every evening | ORAL | 0 refills | Status: DC | PRN
  Filled 2021-04-04: qty 90, 90d supply, fill #0

## 2021-04-04 MED ORDER — DULOXETINE HCL 60 MG PO CPEP
60.0000 mg | ORAL_CAPSULE | Freq: Every day | ORAL | 0 refills | Status: DC
  Filled 2021-04-04: qty 90, 90d supply, fill #0

## 2021-04-07 ENCOUNTER — Other Ambulatory Visit (HOSPITAL_COMMUNITY): Payer: Self-pay

## 2021-04-07 ENCOUNTER — Other Ambulatory Visit: Payer: Self-pay | Admitting: Family Medicine

## 2021-04-07 MED ORDER — TRIAMTERENE-HCTZ 75-50 MG PO TABS
1.0000 | ORAL_TABLET | Freq: Every day | ORAL | 3 refills | Status: DC
  Filled 2021-04-07: qty 90, 90d supply, fill #0
  Filled 2021-07-22: qty 90, 90d supply, fill #1

## 2021-04-10 ENCOUNTER — Encounter: Payer: Self-pay | Admitting: Orthopedic Surgery

## 2021-04-10 ENCOUNTER — Ambulatory Visit: Payer: 59 | Admitting: Orthopedic Surgery

## 2021-04-10 ENCOUNTER — Other Ambulatory Visit: Payer: Self-pay

## 2021-04-10 VITALS — Ht 62.0 in | Wt 178.0 lb

## 2021-04-10 DIAGNOSIS — G43719 Chronic migraine without aura, intractable, without status migrainosus: Secondary | ICD-10-CM | POA: Diagnosis not present

## 2021-04-10 DIAGNOSIS — M791 Myalgia, unspecified site: Secondary | ICD-10-CM | POA: Diagnosis not present

## 2021-04-10 DIAGNOSIS — M1711 Unilateral primary osteoarthritis, right knee: Secondary | ICD-10-CM | POA: Diagnosis not present

## 2021-04-10 DIAGNOSIS — M542 Cervicalgia: Secondary | ICD-10-CM | POA: Diagnosis not present

## 2021-04-10 NOTE — Progress Notes (Signed)
Chief Complaint  Patient presents with   Injections    Rt knee Euflexxa #2.   Procedure note for injection of hyaluronic acid   Diagnosis osteoarthritis of the knee  Verbal consent was obtained to inject the knee with HYALURONIC ACID . Timeout was completed to confirm the injection site as the right   Knee  Ethyl chloride spray was used for anesthesia Alcohol was used to prep the skin. The infrapatellar lateral portal was used as an injection site and 1 vial of hyaluronic acid  was injected into the knee  Specific Co. Preparation: euflexa 2 of 3   No complications were noted  Return 1 week

## 2021-04-17 ENCOUNTER — Other Ambulatory Visit: Payer: Self-pay

## 2021-04-17 ENCOUNTER — Ambulatory Visit: Payer: 59 | Admitting: Orthopedic Surgery

## 2021-04-17 DIAGNOSIS — G8929 Other chronic pain: Secondary | ICD-10-CM

## 2021-04-17 DIAGNOSIS — M1711 Unilateral primary osteoarthritis, right knee: Secondary | ICD-10-CM | POA: Diagnosis not present

## 2021-04-17 NOTE — Progress Notes (Signed)
Chief Complaint  Patient presents with   Knee Pain    RIGHT/ Euflexxa #3   Encounter Diagnoses  Name Primary?   Primary osteoarthritis of right knee Yes   Chronic pain of right knee     Euflexxa No. 3 right knee  Procedure note for injection of hyaluronic acid   Diagnosis osteoarthritis of the knee  Verbal consent was obtained to inject the knee with HYALURONIC ACID . Timeout was completed to confirm the injection site as the right   knee  Ethyl chloride spray was used for anesthesia Alcohol was used to prep the skin. The infrapatellar lateral portal was used as an injection site and 1 vial of hyaluronic acid  was injected into the knee  Specific Co. Preparation: Euflexxa  No complications were noted

## 2021-04-18 ENCOUNTER — Other Ambulatory Visit (HOSPITAL_COMMUNITY): Payer: Self-pay

## 2021-04-18 ENCOUNTER — Ambulatory Visit: Payer: 59 | Admitting: Family Medicine

## 2021-04-18 ENCOUNTER — Encounter: Payer: Self-pay | Admitting: Family Medicine

## 2021-04-18 VITALS — BP 140/80 | HR 94 | Ht 62.0 in | Wt 173.1 lb

## 2021-04-18 DIAGNOSIS — F418 Other specified anxiety disorders: Secondary | ICD-10-CM

## 2021-04-18 DIAGNOSIS — E559 Vitamin D deficiency, unspecified: Secondary | ICD-10-CM | POA: Diagnosis not present

## 2021-04-18 DIAGNOSIS — E669 Obesity, unspecified: Secondary | ICD-10-CM

## 2021-04-18 DIAGNOSIS — F411 Generalized anxiety disorder: Secondary | ICD-10-CM

## 2021-04-18 DIAGNOSIS — Z13220 Encounter for screening for lipoid disorders: Secondary | ICD-10-CM

## 2021-04-18 DIAGNOSIS — F17200 Nicotine dependence, unspecified, uncomplicated: Secondary | ICD-10-CM | POA: Diagnosis not present

## 2021-04-18 DIAGNOSIS — I10 Essential (primary) hypertension: Secondary | ICD-10-CM | POA: Diagnosis not present

## 2021-04-18 DIAGNOSIS — Z1231 Encounter for screening mammogram for malignant neoplasm of breast: Secondary | ICD-10-CM

## 2021-04-18 DIAGNOSIS — G43119 Migraine with aura, intractable, without status migrainosus: Secondary | ICD-10-CM | POA: Diagnosis not present

## 2021-04-18 DIAGNOSIS — R7303 Prediabetes: Secondary | ICD-10-CM | POA: Diagnosis not present

## 2021-04-18 MED ORDER — NICOTINE 7 MG/24HR TD PT24
7.0000 mg | MEDICATED_PATCH | Freq: Every day | TRANSDERMAL | 0 refills | Status: DC
  Filled 2021-04-18: qty 56, 56d supply, fill #0

## 2021-04-18 MED ORDER — BUPROPION HCL ER (SR) 150 MG PO TB12
150.0000 mg | ORAL_TABLET | Freq: Two times a day (BID) | ORAL | 1 refills | Status: DC
Start: 2021-04-18 — End: 2021-09-16
  Filled 2021-04-18: qty 180, 90d supply, fill #0

## 2021-04-18 MED ORDER — NICOTINE 14 MG/24HR TD PT24
14.0000 mg | MEDICATED_PATCH | Freq: Every day | TRANSDERMAL | 0 refills | Status: DC
Start: 2021-04-18 — End: 2021-09-16
  Filled 2021-04-18: qty 28, 28d supply, fill #0

## 2021-04-18 NOTE — Patient Instructions (Addendum)
F/U in 10 weeks re assess BP and nicotine use, call if you need me sooner  Annual with pap July 1 or shortly after  Please schedule mammogram and lung cancer chest scan screening at checkout.  Thankful your  overall health is much improved.  New for helping to stop smoking is Wellbutrin 1 twice daily.  NicoDerm patches also prescribed for the next 3 months.  Do not use a patch while still smoking cigarettes.  Plan to quit smoking in 2 weeks.    Fasting lipid CMP and EGFR TSH vitamin D, CBC in next 1 week  Nurse pls re check room air oxygen level  Call [facility] for help with smoking cessation  Thanks for choosing [facility], we consider it a privelige to serve you.

## 2021-04-18 NOTE — Assessment & Plan Note (Addendum)
Asked:confirms currently smokes cigarettes Assess: willing to set a quit date, and  is cutting back Advise: needs to QUIT to reduce risk of cancer, cardio and cerebrovascular disease Assist: counseled for 5 minutes and literature provided Arrange: follow up in 2 to 4 months  wellbutril and nicotine replacement is prescribed

## 2021-04-21 ENCOUNTER — Encounter: Payer: Self-pay | Admitting: Family Medicine

## 2021-04-21 NOTE — Assessment & Plan Note (Signed)
Sub  Optimal control re eval in 10 weeks DASH diet and commitment to daily physical activity for a minimum of 30 minutes discussed and encouraged, as a part of hypertension management. The importance of attaining a healthy weight is also discussed.  BP/Weight 04/18/2021 04/10/2021 01/02/2021 09/05/2020 09/04/2020 03/11/2020 40/98/1191  Systolic BP 478 - 295 621 308 657 846  Diastolic BP 80 - 85 76 82 92 103  Wt. (Lbs) 173.12 178 178 176 175.8 176 170  BMI 31.66 32.56 32.56 32.19 32.15 32.19 31.09

## 2021-04-21 NOTE — Progress Notes (Signed)
Zoe Bailey     MRN: [id number]      DOB: 1965/12/10   HPI Ms. Rettinger is here for follow up and re-evaluation of chronic medical conditions, medication management and review of any available recent lab and radiology data.  Preventive health is updated, specifically  Cancer screening and Immunization.   Reports 180 degress change I her life once she gained adequate pain control for her multiple joint problems , no longer depressed, motivated and feels able to quit smoking Feels better than she has in 20 years! The PT denies any adverse reactions to current medications since the last visit.  There are no specific complaints   ROS Denies recent fever or chills. Denies sinus pressure, nasal congestion, ear pain or sore throat. Denies chest congestion, productive cough or wheezing. Denies chest pains, palpitations and leg swelling Denies abdominal pain, nausea, vomiting,diarrhea or constipation.   Denies dysuria, frequency, hesitancy or incontinence. Denies  uncontrolled joint pain, swelling and limitation in mobility. Denies headaches, seizures, numbness, or tingling. Denies uncontrolled depression, anxiety or insomnia. Denies skin break down or rash.   PE  BP 140/80    Pulse 94    Ht 5\' 2"  (1.575 m)    Wt 173 lb 1.9 oz (78.5 kg)    LMP 08/21/2017 (Approximate) Comment: preg. test negative   SpO2 97%    BMI 31.66 kg/m   Patient alert and oriented and in no cardiopulmonary distress.  HEENT: No facial asymmetry, EOMI,     Neck supple .  Chest: Clear to auscultation bilaterally.Decreased air entry  CVS: S1, S2 no murmurs, no S3.Regular rate.  ABD: Soft non tender.   Ext: No edema  MS: Adequate ROM spine, shoulders, hips and knees.  Skin: Intact, no ulcerations or rash noted.  Psych: Good eye contact, normal affect. Memory intact not anxious or depressed appearing.  CNS: CN 2-12 intact, power,  normal throughout.no focal deficits noted.   Assessment & Plan  NICOTINE  ADDICTION Asked:confirms currently smokes cigarettes Assess: willing to set a quit date, and  is cutting back Advise: needs to QUIT to reduce risk of cancer, cardio and cerebrovascular disease Assist: counseled for 5 minutes and literature provided Arrange: follow up in 2 to 4 months  wellbutril and nicotine replacement is prescribed   Essential hypertension Sub  Optimal control re eval in 10 weeks DASH diet and commitment to daily physical activity for a minimum of 30 minutes discussed and encouraged, as a part of hypertension management. The importance of attaining a healthy weight is also discussed.  BP/Weight 04/18/2021 04/10/2021 01/02/2021 09/05/2020 09/04/2020 03/11/2020 47/42/5956  Systolic BP 387 - 564 332 951 884 166  Diastolic BP 80 - 85 76 82 92 103  Wt. (Lbs) 173.12 178 178 176 175.8 176 170  BMI 31.66 32.56 32.56 32.19 32.15 32.19 31.09       Depression with anxiety Marked improvement with pain control  Obesity (BMI 30.0-34.9)  Patient re-educated about  the importance of commitment to a  minimum of 150 minutes of exercise per week as able.  The importance of healthy food choices with portion control discussed, as well as eating regularly and within a 12 hour window most days. The need to choose "clean , green" food 50 to 75% of the time is discussed, as well as to make water the primary drink and set a goal of 64 ounces water daily.    Weight /BMI 04/18/2021 04/10/2021 01/02/2021  WEIGHT 173 lb 1.9 oz 178  lb 178 lb  HEIGHT 5\' 2"  5\' 2"  5\' 2"   BMI 31.66 kg/m2 32.56 kg/m2 32.56 kg/m2      Migraine Controlled and managed by Neurology  GAD (generalized anxiety disorder) Controlled, no change in medication

## 2021-04-21 NOTE — Assessment & Plan Note (Signed)
Controlled and managed by Neurology 

## 2021-04-21 NOTE — Assessment & Plan Note (Signed)
Controlled, no change in medication  

## 2021-04-21 NOTE — Assessment & Plan Note (Signed)
°  Patient re-educated about  the importance of commitment to a  minimum of 150 minutes of exercise per week as able.  The importance of healthy food choices with portion control discussed, as well as eating regularly and within a 12 hour window most days. The need to choose "clean , green" food 50 to 75% of the time is discussed, as well as to make water the primary drink and set a goal of 64 ounces water daily.    Weight /BMI 04/18/2021 04/10/2021 01/02/2021  WEIGHT 173 lb 1.9 oz 178 lb 178 lb  HEIGHT 5\' 2"  5\' 2"  5\' 2"   BMI 31.66 kg/m2 32.56 kg/m2 32.56 kg/m2

## 2021-04-21 NOTE — Assessment & Plan Note (Signed)
Marked improvement with pain control

## 2021-04-24 ENCOUNTER — Other Ambulatory Visit: Payer: Self-pay | Admitting: Family Medicine

## 2021-04-24 ENCOUNTER — Other Ambulatory Visit (HOSPITAL_COMMUNITY): Payer: Self-pay

## 2021-04-24 MED ORDER — CYCLOBENZAPRINE HCL 10 MG PO TABS
10.0000 mg | ORAL_TABLET | Freq: Three times a day (TID) | ORAL | 2 refills | Status: DC | PRN
  Filled 2021-04-24: qty 30, 10d supply, fill #0
  Filled 2021-05-19: qty 30, 10d supply, fill #1
  Filled 2021-06-09: qty 30, 10d supply, fill #2

## 2021-04-25 ENCOUNTER — Ambulatory Visit (HOSPITAL_COMMUNITY)
Admission: RE | Admit: 2021-04-25 | Discharge: 2021-04-25 | Disposition: A | Discharge status: Home / Self Care | Payer: 59 | Source: Ambulatory Visit | Attending: Family Medicine | Admitting: Family Medicine

## 2021-04-25 ENCOUNTER — Other Ambulatory Visit: Payer: Self-pay

## 2021-04-25 DIAGNOSIS — Z1231 Encounter for screening mammogram for malignant neoplasm of breast: Secondary | ICD-10-CM | POA: Diagnosis not present

## 2021-05-05 ENCOUNTER — Other Ambulatory Visit (HOSPITAL_COMMUNITY): Payer: Self-pay

## 2021-05-19 ENCOUNTER — Other Ambulatory Visit (HOSPITAL_COMMUNITY): Payer: Self-pay

## 2021-05-22 ENCOUNTER — Other Ambulatory Visit (HOSPITAL_COMMUNITY): Payer: Self-pay

## 2021-05-26 ENCOUNTER — Other Ambulatory Visit (HOSPITAL_COMMUNITY): Payer: Self-pay

## 2021-05-27 ENCOUNTER — Other Ambulatory Visit (HOSPITAL_COMMUNITY): Payer: Self-pay

## 2021-05-29 ENCOUNTER — Other Ambulatory Visit (HOSPITAL_COMMUNITY): Payer: Self-pay

## 2021-05-29 DIAGNOSIS — M791 Myalgia, unspecified site: Secondary | ICD-10-CM | POA: Diagnosis not present

## 2021-05-29 DIAGNOSIS — M542 Cervicalgia: Secondary | ICD-10-CM | POA: Diagnosis not present

## 2021-05-29 DIAGNOSIS — G43719 Chronic migraine without aura, intractable, without status migrainosus: Secondary | ICD-10-CM | POA: Diagnosis not present

## 2021-06-02 ENCOUNTER — Ambulatory Visit (HOSPITAL_COMMUNITY): Payer: 59

## 2021-06-06 ENCOUNTER — Other Ambulatory Visit (HOSPITAL_COMMUNITY): Payer: Self-pay

## 2021-06-06 DIAGNOSIS — Z79891 Long term (current) use of opiate analgesic: Secondary | ICD-10-CM | POA: Diagnosis not present

## 2021-06-06 DIAGNOSIS — G4700 Insomnia, unspecified: Secondary | ICD-10-CM | POA: Diagnosis not present

## 2021-06-06 DIAGNOSIS — M25569 Pain in unspecified knee: Secondary | ICD-10-CM | POA: Diagnosis not present

## 2021-06-06 DIAGNOSIS — M545 Low back pain, unspecified: Secondary | ICD-10-CM | POA: Diagnosis not present

## 2021-06-06 DIAGNOSIS — M79672 Pain in left foot: Secondary | ICD-10-CM | POA: Diagnosis not present

## 2021-06-06 DIAGNOSIS — M25579 Pain in unspecified ankle and joints of unspecified foot: Secondary | ICD-10-CM | POA: Diagnosis not present

## 2021-06-06 DIAGNOSIS — G8929 Other chronic pain: Secondary | ICD-10-CM | POA: Diagnosis not present

## 2021-06-06 DIAGNOSIS — Z79899 Other long term (current) drug therapy: Secondary | ICD-10-CM | POA: Diagnosis not present

## 2021-06-06 DIAGNOSIS — M25539 Pain in unspecified wrist: Secondary | ICD-10-CM | POA: Diagnosis not present

## 2021-06-09 ENCOUNTER — Other Ambulatory Visit (HOSPITAL_COMMUNITY): Payer: Self-pay

## 2021-06-09 MED ORDER — HYDROCODONE-ACETAMINOPHEN 7.5-325 MG PO TABS
1.0000 | ORAL_TABLET | Freq: Three times a day (TID) | ORAL | 0 refills | Status: DC | PRN
  Filled 2021-08-11: qty 90, 30d supply, fill #0

## 2021-06-09 MED ORDER — HYDROXYZINE HCL 25 MG PO TABS
25.0000 mg | ORAL_TABLET | Freq: Every day | ORAL | 0 refills | Status: DC
  Filled 2021-06-09: qty 90, 90d supply, fill #0

## 2021-06-09 MED ORDER — HYDROCODONE-ACETAMINOPHEN 7.5-325 MG PO TABS
1.0000 | ORAL_TABLET | Freq: Three times a day (TID) | ORAL | 0 refills | Status: DC | PRN
Start: 2021-09-03 — End: 2021-09-16
  Filled 2021-09-10: qty 90, 30d supply, fill #0

## 2021-06-09 MED ORDER — HYDROCODONE-ACETAMINOPHEN 7.5-325 MG PO TABS
1.0000 | ORAL_TABLET | Freq: Three times a day (TID) | ORAL | 0 refills | Status: DC | PRN
Start: 2021-07-05 — End: 2021-09-16
  Filled 2021-07-08: qty 90, 30d supply, fill #0

## 2021-06-09 MED ORDER — DULOXETINE HCL 60 MG PO CPEP
60.0000 mg | ORAL_CAPSULE | Freq: Every day | ORAL | 0 refills | Status: DC
  Filled 2021-06-09: qty 90, 90d supply, fill #0

## 2021-06-27 ENCOUNTER — Other Ambulatory Visit: Payer: Self-pay | Admitting: Family Medicine

## 2021-06-27 ENCOUNTER — Other Ambulatory Visit (HOSPITAL_COMMUNITY): Payer: Self-pay

## 2021-06-27 ENCOUNTER — Ambulatory Visit: Payer: 59 | Admitting: Family Medicine

## 2021-06-27 MED ORDER — CYCLOBENZAPRINE HCL 10 MG PO TABS
10.0000 mg | ORAL_TABLET | Freq: Three times a day (TID) | ORAL | 2 refills | Status: DC | PRN
Start: 2021-06-27 — End: 2021-09-10
  Filled 2021-06-27: qty 30, 10d supply, fill #0
  Filled 2021-07-22: qty 30, 10d supply, fill #1
  Filled 2021-08-12: qty 30, 10d supply, fill #2

## 2021-06-27 MED ORDER — DICLOFENAC SODIUM 75 MG PO TBEC
75.0000 mg | DELAYED_RELEASE_TABLET | Freq: Every day | ORAL | 2 refills | Status: DC | PRN
  Filled 2021-06-27: qty 30, 30d supply, fill #0
  Filled 2021-08-11: qty 30, 30d supply, fill #1
  Filled 2021-11-19: qty 30, 30d supply, fill #2

## 2021-07-07 ENCOUNTER — Other Ambulatory Visit (HOSPITAL_COMMUNITY): Payer: Self-pay

## 2021-07-08 ENCOUNTER — Other Ambulatory Visit (HOSPITAL_COMMUNITY): Payer: Self-pay

## 2021-07-08 DIAGNOSIS — M791 Myalgia, unspecified site: Secondary | ICD-10-CM | POA: Diagnosis not present

## 2021-07-08 DIAGNOSIS — M542 Cervicalgia: Secondary | ICD-10-CM | POA: Diagnosis not present

## 2021-07-08 DIAGNOSIS — G43719 Chronic migraine without aura, intractable, without status migrainosus: Secondary | ICD-10-CM | POA: Diagnosis not present

## 2021-07-22 ENCOUNTER — Other Ambulatory Visit (HOSPITAL_COMMUNITY): Payer: Self-pay

## 2021-07-28 DIAGNOSIS — X32XXXD Exposure to sunlight, subsequent encounter: Secondary | ICD-10-CM | POA: Diagnosis not present

## 2021-07-28 DIAGNOSIS — L570 Actinic keratosis: Secondary | ICD-10-CM | POA: Diagnosis not present

## 2021-07-28 DIAGNOSIS — D225 Melanocytic nevi of trunk: Secondary | ICD-10-CM | POA: Diagnosis not present

## 2021-07-28 DIAGNOSIS — Z1283 Encounter for screening for malignant neoplasm of skin: Secondary | ICD-10-CM | POA: Diagnosis not present

## 2021-07-30 ENCOUNTER — Other Ambulatory Visit (HOSPITAL_COMMUNITY): Payer: Self-pay

## 2021-07-30 MED ORDER — ONABOTULINUMTOXINA 100 UNITS IJ SOLR
INTRAMUSCULAR | 3 refills | Status: DC
  Filled 2021-08-14: qty 2, 84d supply, fill #0
  Filled 2021-11-13: qty 2, 84d supply, fill #1
  Filled 2022-02-10 – 2022-02-17 (×2): qty 2, 84d supply, fill #2
  Filled 2022-05-11: qty 2, 84d supply, fill #3

## 2021-08-05 ENCOUNTER — Telehealth: Payer: 59 | Admitting: Physician Assistant

## 2021-08-05 ENCOUNTER — Other Ambulatory Visit (HOSPITAL_COMMUNITY): Payer: Self-pay

## 2021-08-05 DIAGNOSIS — J039 Acute tonsillitis, unspecified: Secondary | ICD-10-CM

## 2021-08-05 DIAGNOSIS — J019 Acute sinusitis, unspecified: Secondary | ICD-10-CM

## 2021-08-05 DIAGNOSIS — B9689 Other specified bacterial agents as the cause of diseases classified elsewhere: Secondary | ICD-10-CM | POA: Diagnosis not present

## 2021-08-05 MED ORDER — PREDNISONE 10 MG (21) PO TBPK: ORAL_TABLET | ORAL | 0 refills | Status: DC

## 2021-08-05 MED ORDER — AMOXICILLIN-POT CLAVULANATE 875-125 MG PO TABS
1.0000 | ORAL_TABLET | Freq: Two times a day (BID) | ORAL | 0 refills | Status: DC
Start: 2021-08-05 — End: 2021-09-16

## 2021-08-05 NOTE — Progress Notes (Signed)
I have spent 5 minutes in review of e-visit questionnaire, review and updating patient chart, medical decision making and response to patient.   Kenichi Cassada Cody Laquandra Carrillo, PA-C    

## 2021-08-05 NOTE — Progress Notes (Signed)
E-Visit for Sinus Problems  We are sorry that you are not feeling well.  Here is how we plan to help!  Based on what you have shared with me it looks like you have sinusitis.  Sinusitis is inflammation and infection in the sinus cavities of the head.  Based on your presentation I believe you most likely have Acute Bacterial Sinusitis.  This is an infection caused by bacteria and is treated with antibiotics. I have prescribed Augmentin '875mg'$ /'125mg'$  one tablet twice daily with food, for 7 days. You may use an oral decongestant such as Mucinex D or if you have glaucoma or high blood pressure use plain Mucinex. Saline nasal spray help and can safely be used as often as needed for congestion.  I have also sent in a steroid pack to take as directed for throat pain and swelling and for sinus swelling. If you develop worsening sinus pain, fever or notice severe headache and vision changes, or if symptoms are not better after completion of antibiotic, please schedule an appointment with a health care provider.    Sinus infections are not as easily transmitted as other respiratory infection, however we still recommend that you avoid close contact with loved ones, especially the very young and elderly.  Remember to wash your hands thoroughly throughout the day as this is the number one way to prevent the spread of infection!  Home Care: Only take medications as instructed by your medical team. Complete the entire course of an antibiotic. Do not take these medications with alcohol. A steam or ultrasonic humidifier can help congestion.  You can place a towel over your head and breathe in the steam from hot water coming from a faucet. Avoid close contacts especially the very young and the elderly. Cover your mouth when you cough or sneeze. Always remember to wash your hands.  Get Help Right Away If: You develop worsening fever or sinus pain. You develop a severe head ache or visual changes. Your symptoms  persist after you have completed your treatment plan.  Make sure you Understand these instructions. Will watch your condition. Will get help right away if you are not doing well or get worse.  Thank you for choosing an e-visit.  Your e-visit answers were reviewed by a board certified advanced clinical practitioner to complete your personal care plan. Depending upon the condition, your plan could have included both over the counter or prescription medications.  Please review your pharmacy choice. Make sure the pharmacy is open so you can pick up prescription now. If there is a problem, you may contact your provider through [employer] and have the prescription routed to another pharmacy.  Your safety is important to [place]. If you have drug allergies check your prescription carefully.   For the next 24 hours you can use MyChart to ask questions about today's visit, request a non-urgent call back, or ask for a work or school excuse. You will get an email in the next two days asking about your experience. I hope that your e-visit has been valuable and will speed your recovery.

## 2021-08-11 ENCOUNTER — Other Ambulatory Visit (HOSPITAL_COMMUNITY): Payer: Self-pay

## 2021-08-12 ENCOUNTER — Other Ambulatory Visit (HOSPITAL_COMMUNITY): Payer: Self-pay

## 2021-08-14 ENCOUNTER — Other Ambulatory Visit (HOSPITAL_COMMUNITY): Payer: Self-pay

## 2021-08-15 ENCOUNTER — Other Ambulatory Visit (HOSPITAL_COMMUNITY): Payer: Self-pay

## 2021-08-18 ENCOUNTER — Telehealth: Payer: Self-pay | Admitting: Orthopedic Surgery

## 2021-08-18 ENCOUNTER — Other Ambulatory Visit (HOSPITAL_COMMUNITY): Payer: Self-pay

## 2021-08-18 DIAGNOSIS — M1711 Unilateral primary osteoarthritis, right knee: Secondary | ICD-10-CM

## 2021-08-18 NOTE — Telephone Encounter (Signed)
Patient asked about scheduling appointment with Dr Aline Brochure for her foot (she also sees him for knees). States it is the foot that Dr Aline Brochure had referred to Dr Sharol Given - states she would prefer not to go back. Relayed we would likely not schedule here for this issue since she was referred out. Please advise.

## 2021-08-18 NOTE — Telephone Encounter (Signed)
Patient called for her next series of Euflexxa injections, from last series completed in  February of this year - due again in August of this year. Please advise.

## 2021-08-18 NOTE — Telephone Encounter (Signed)
I am not seeing any special requests or any other requests before I leave my schedule aspect I have very few appointments have to be reserved for problems infections cast problems that will be until I come back

## 2021-08-19 NOTE — Telephone Encounter (Signed)
Called back to patient; relayed; voiced understanding.

## 2021-08-20 ENCOUNTER — Other Ambulatory Visit (HOSPITAL_COMMUNITY): Payer: Self-pay

## 2021-08-21 ENCOUNTER — Other Ambulatory Visit (HOSPITAL_COMMUNITY): Payer: Self-pay

## 2021-08-28 DIAGNOSIS — M791 Myalgia, unspecified site: Secondary | ICD-10-CM | POA: Diagnosis not present

## 2021-08-28 DIAGNOSIS — G43719 Chronic migraine without aura, intractable, without status migrainosus: Secondary | ICD-10-CM | POA: Diagnosis not present

## 2021-08-28 DIAGNOSIS — M542 Cervicalgia: Secondary | ICD-10-CM | POA: Diagnosis not present

## 2021-08-29 ENCOUNTER — Other Ambulatory Visit (HOSPITAL_COMMUNITY): Payer: Self-pay

## 2021-08-29 DIAGNOSIS — G8929 Other chronic pain: Secondary | ICD-10-CM | POA: Diagnosis not present

## 2021-08-29 DIAGNOSIS — G4700 Insomnia, unspecified: Secondary | ICD-10-CM | POA: Diagnosis not present

## 2021-08-29 DIAGNOSIS — M79672 Pain in left foot: Secondary | ICD-10-CM | POA: Diagnosis not present

## 2021-08-29 DIAGNOSIS — M25579 Pain in unspecified ankle and joints of unspecified foot: Secondary | ICD-10-CM | POA: Diagnosis not present

## 2021-08-29 DIAGNOSIS — M545 Low back pain, unspecified: Secondary | ICD-10-CM | POA: Diagnosis not present

## 2021-08-29 DIAGNOSIS — Z79891 Long term (current) use of opiate analgesic: Secondary | ICD-10-CM | POA: Diagnosis not present

## 2021-08-29 DIAGNOSIS — M25569 Pain in unspecified knee: Secondary | ICD-10-CM | POA: Diagnosis not present

## 2021-08-29 MED ORDER — HYDROXYZINE HCL 25 MG PO TABS
25.0000 mg | ORAL_TABLET | Freq: Every day | ORAL | 0 refills | Status: DC
  Filled 2021-08-29: qty 90, 90d supply, fill #0

## 2021-08-29 MED ORDER — DULOXETINE HCL 60 MG PO CPEP
60.0000 mg | ORAL_CAPSULE | Freq: Every day | ORAL | 0 refills | Status: DC
  Filled 2021-08-29: qty 90, 90d supply, fill #0

## 2021-08-29 MED ORDER — HYDROCODONE-ACETAMINOPHEN 7.5-325 MG PO TABS: 1.0000 | ORAL_TABLET | Freq: Three times a day (TID) | ORAL | 0 refills | Status: DC | PRN

## 2021-09-08 ENCOUNTER — Encounter: Payer: Self-pay | Admitting: Orthopedic Surgery

## 2021-09-08 ENCOUNTER — Ambulatory Visit (INDEPENDENT_AMBULATORY_CARE_PROVIDER_SITE_OTHER): Payer: 59

## 2021-09-08 ENCOUNTER — Ambulatory Visit (INDEPENDENT_AMBULATORY_CARE_PROVIDER_SITE_OTHER): Payer: 59 | Admitting: Orthopedic Surgery

## 2021-09-08 DIAGNOSIS — M76822 Posterior tibial tendinitis, left leg: Secondary | ICD-10-CM | POA: Diagnosis not present

## 2021-09-08 DIAGNOSIS — M25572 Pain in left ankle and joints of left foot: Secondary | ICD-10-CM

## 2021-09-08 NOTE — Progress Notes (Signed)
Office Visit Note   Patient: Zoe Bailey           Date of Birth: 01/17/1966           MRN: [id number] Visit Date: 09/08/2021              Requested by: Fayrene Helper, MD [address] [place],  [place] [address] PCP: Fayrene Helper, MD  Chief Complaint  Patient presents with   Left Foot - Pain   Left Ankle - Pain      HPI: Patient is a 56 year old woman who presents with progressive pain and deformity left foot.  Patient complains of pain over the lateral aspect of her foot as well as medially with progressive pronation and valgus of her foot.  She states she has pain with ambulation the brace no longer is helpful.  Assessment & Plan: Visit Diagnoses:  1. Pain in left ankle and joints of left foot   2. Posterior tibial tendon dysfunction (PTTD) of left lower extremity     Plan: With the patient's progressive deformity with posterior tibial tendon insufficiency recommended proceeding with a subtalar and talonavicular fusion.  Risks and benefits were discussed including the loss of the subtalar motion prolonged rehab as well as the potential to still have symptoms.  Patient states she understands she would like to proceed with surgery in September.  Follow-Up Instructions: No follow-ups on file.   Ortho Exam  Patient is alert, oriented, no adenopathy, well-dressed, normal affect, normal respiratory effort. Examination patient has a palpable pulse she has a pronated valgus hindfoot she cannot do a single limb heel raise she does not have tenderness to palpation over the peroneal tendons or posterior tibial tendon she has swelling over the sinus Tarsi and pain to palpation over the sinus Tarsi.  She also has pain to palpation over the talonavicular joint.  Imaging: XR Ankle 2 Views Left  Result Date: 09/08/2021 2 view radiographs of the left ankle shows a congruent tibiotalar joint.  XR Foot 2 Views Left  Result Date: 09/08/2021 2 view radiographs of the  left foot shows a pronated valgus foot with subluxed talonavicular joint and collapse across the subtalar joint.  No images are attached to the encounter.  Labs: Lab Results  Component Value Date   HGBA1C 5.5 09/05/2020   HGBA1C 5.5 10/05/2019   HGBA1C 5.5 10/05/2019   HGBA1C 5.5 (A) 10/05/2019   HGBA1C 5.5 10/05/2019   ESRSEDRATE 14 05/11/2014   CRP <0.5 (L) 05/11/2014   REPTSTATUS 09/20/2018 FINAL 09/19/2018   CULT (A) 09/19/2018    <10,000 COLONIES/mL INSIGNIFICANT GROWTH Performed at [facility], [place] [address] [place], [place] [address]    LABORGA ESCHERICHIA COLI 12/26/2015     Lab Results  Component Value Date   ALBUMIN 4.2 09/05/2020   ALBUMIN 4.0 09/16/2018   ALBUMIN 3.9 04/12/2017    No results found for: "MG" Lab Results  Component Value Date   VD25OH 25.1 (L) 09/16/2018   VD25OH 29.5 (L) 04/12/2017   VD25OH 25.4 (L) 04/10/2016    No results found for: "PREALBUMIN"    Latest Ref Rng & Units 09/05/2020   11:19 AM 09/16/2018    8:20 AM 02/10/2018    4:17 PM  CBC EXTENDED  WBC 3.4 - 10.8 x10E3/uL 9.8  10.2  11.2   RBC 3.77 - 5.28 x10E6/uL 4.29  4.58  4.31   Hemoglobin 11.1 - 15.9 g/dL 13.2  14.1  13.7  HCT 34.0 - 46.6 % 41.6  42.8  41.3   Platelets 150 - 450 x10E3/uL 320  219  206   NEUT# 1.4 - 7.0 x10E3/uL 6.3     Lymph# 0.7 - 3.1 x10E3/uL 2.8        There is no height or weight on file to calculate BMI.  Orders:  Orders Placed This Encounter  Procedures   XR Foot 2 Views Left   XR Ankle 2 Views Left   No orders of the defined types were placed in this encounter.    Procedures: No procedures performed  Clinical Data: No additional findings.  ROS:  All other systems negative, except as noted in the HPI. Review of Systems  Objective: Vital Signs: LMP 04/09/2017 (Approximate)   Specialty Comments:  No specialty comments available.  PMFS History: Patient Active Problem List   Diagnosis Date Noted   Prediabetes 10/07/2019    Flat feet, bilateral 07/05/2019   Other chronic pain 06/21/2019   Falls frequently 06/21/2019   Cyst of knee joint 06/21/2019   GERD (gastroesophageal reflux disease) 01/04/2019   Hoarseness, chronic 12/22/2018   Sigmoid diverticulosis 08/11/2017   Essential hypertension 02/28/2015   Joint pain 02/28/2015   Easy bruising 02/19/2014   GAD (generalized anxiety disorder) 01/04/2013   CMC arthritis, thumb, degenerative 04/02/2011   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/02/2011   Obesity (BMI 30.0-34.9) 10/22/2009   Depression with anxiety 10/22/2009   NICOTINE ADDICTION 10/09/2008   Migraine 02/24/2007   Past Medical History:  Diagnosis Date   Arthritis    Cancer ([place]) 2010 approx   skin cancer   Complication of anesthesia    Wakes up during all surgeries   COPD (chronic obstructive pulmonary disease) ([place])    Depression    Dysphagia 12/22/2018   Foot pain, right 03/2014   Hemorrhoid 08/11/2017   Menometrorrhagia 07/14/2013   Migraine    USES BOTOX FOR TREATMENT    Oral phase dysphagia 01/24/2019   Added automatically from request for surgery [id number]   Polymenorrhea 07/11/2013   Started two cycles per month since 03/2013   Shortness of breath dyspnea    Sleep apnea    Toenail fungus     Family History  Problem Relation Age of Onset   Arthritis Other    Cancer Other     Past Surgical History:  Procedure Laterality Date   ANKLE FRACTURE SURGERY Right    BREAST ENHANCEMENT SURGERY     CARPAL TUNNEL RELEASE Right    x2   CHONDROPLASTY Right 02/17/2018   Procedure: CHONDROPLASTY;  Surgeon: Paralee Cancel, MD;  Location: [facility];  Service: Orthopedics;  Laterality: Right;   COLONOSCOPY WITH PROPOFOL N/A 05/07/2017   Procedure: COLONOSCOPY WITH PROPOFOL;  Surgeon: Rogene Houston, MD;  Location: [facility];  Service: Endoscopy;  Laterality: N/A;  8:30   ESOPHAGOGASTRODUODENOSCOPY (EGD) WITH PROPOFOL N/A 03/06/2019   Procedure: ESOPHAGOGASTRODUODENOSCOPY (EGD)  WITH PROPOFOL;  Surgeon: Rogene Houston, MD;  Location: [facility];  Service: Endoscopy;  Laterality: N/A;   HERNIA REPAIR     umbilical   KNEE ARTHROSCOPY WITH MEDIAL MENISECTOMY Right 02/17/2018   Procedure: KNEE ARTHROSCOPY WITH MEDIAL MENISECTOMY;  Surgeon: Paralee Cancel, MD;  Location: [facility];  Service: Orthopedics;  Laterality: Right;   MALONEY DILATION  03/06/2019   Procedure: MALONEY DILATION;  Surgeon: Rogene Houston, MD;  Location: [facility];  Service: Endoscopy;;   NASAL POLYP SURGERY     POLYPECTOMY  WRIST FUSION Right    Social History   Occupational History   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

## 2021-09-10 ENCOUNTER — Other Ambulatory Visit: Payer: Self-pay | Admitting: Family Medicine

## 2021-09-10 ENCOUNTER — Other Ambulatory Visit (HOSPITAL_COMMUNITY): Payer: Self-pay

## 2021-09-12 ENCOUNTER — Other Ambulatory Visit (HOSPITAL_COMMUNITY)
Admission: RE | Admit: 2021-09-12 | Discharge: 2021-09-12 | Disposition: A | Discharge status: Home / Self Care | Payer: 59 | Source: Ambulatory Visit | Attending: Interventional Cardiology | Admitting: Interventional Cardiology

## 2021-09-12 ENCOUNTER — Other Ambulatory Visit (HOSPITAL_COMMUNITY): Payer: Self-pay

## 2021-09-12 DIAGNOSIS — Z13220 Encounter for screening for lipoid disorders: Secondary | ICD-10-CM | POA: Diagnosis not present

## 2021-09-12 LAB — CBC
HCT: 41.5 % (ref 36.0–46.0)
Hemoglobin: 13.9 g/dL (ref 12.0–15.0)
MCH: 30.9 pg (ref 26.0–34.0)
MCHC: 33.5 g/dL (ref 30.0–36.0)
MCV: 92.2 fL (ref 80.0–100.0)
Platelets: 273 10*3/uL (ref 150–400)
RBC: 4.5 MIL/uL (ref 3.87–5.11)
RDW: 14.6 % (ref 11.5–15.5)
WBC: 10 10*3/uL (ref 4.0–10.5)
nRBC: 0 % (ref 0.0–0.2)

## 2021-09-12 LAB — COMPREHENSIVE METABOLIC PANEL
ALT: 14 U/L (ref 0–44)
AST: 16 U/L (ref 15–41)
Albumin: 3.8 g/dL (ref 3.5–5.0)
Alkaline Phosphatase: 129 U/L — ABNORMAL HIGH (ref 38–126)
Anion gap: 7 (ref 5–15)
BUN: 23 mg/dL — ABNORMAL HIGH (ref 6–20)
CO2: 24 mmol/L (ref 22–32)
Calcium: 8.7 mg/dL — ABNORMAL LOW (ref 8.9–10.3)
Chloride: 107 mmol/L (ref 98–111)
Creatinine, Ser: 0.66 mg/dL (ref 0.44–1.00)
GFR, Estimated: >60 mL/min (ref >60)
Glucose, Bld: 104 mg/dL — ABNORMAL HIGH (ref 70–99)
Potassium: 4.3 mmol/L (ref 3.5–5.1)
Sodium: 138 mmol/L (ref 135–145)
Total Bilirubin: 0.4 mg/dL (ref 0.3–1.2)
Total Protein: 7.4 g/dL (ref 6.5–8.1)

## 2021-09-12 LAB — LIPID PANEL
Cholesterol: 188 mg/dL (ref 0–200)
HDL: 51 mg/dL (ref >40)
LDL Cholesterol: 113 mg/dL — ABNORMAL HIGH (ref 0–99)
Total CHOL/HDL Ratio: 3.7 RATIO
Triglycerides: 121 mg/dL (ref <150)
VLDL: 24 mg/dL (ref 0–40)

## 2021-09-12 LAB — VITAMIN D 25 HYDROXY (VIT D DEFICIENCY, FRACTURES): Vit D, 25-Hydroxy: 21.32 ng/mL — ABNORMAL LOW (ref 30–100)

## 2021-09-12 LAB — TSH: TSH: 2.172 u[IU]/mL (ref 0.350–4.500)

## 2021-09-15 ENCOUNTER — Telehealth: Payer: Self-pay | Admitting: *Deleted

## 2021-09-15 ENCOUNTER — Other Ambulatory Visit: Payer: Self-pay | Admitting: Family Medicine

## 2021-09-15 ENCOUNTER — Other Ambulatory Visit (HOSPITAL_COMMUNITY): Payer: Self-pay

## 2021-09-15 MED ORDER — CYCLOBENZAPRINE HCL 10 MG PO TABS
10.0000 mg | ORAL_TABLET | Freq: Three times a day (TID) | ORAL | 2 refills | Status: DC | PRN
  Filled 2021-09-15: qty 30, 10d supply, fill #0

## 2021-09-15 NOTE — Telephone Encounter (Signed)
Flexeril sent

## 2021-09-15 NOTE — Telephone Encounter (Signed)
Pt needs refill on flexerill

## 2021-09-16 ENCOUNTER — Other Ambulatory Visit (HOSPITAL_COMMUNITY): Payer: Self-pay

## 2021-09-16 ENCOUNTER — Encounter: Payer: Self-pay | Admitting: Family Medicine

## 2021-09-16 ENCOUNTER — Other Ambulatory Visit (HOSPITAL_COMMUNITY)
Admission: RE | Admit: 2021-09-16 | Discharge: 2021-09-16 | Disposition: A | Discharge status: Home / Self Care | Payer: 59 | Source: Ambulatory Visit | Attending: Family Medicine | Admitting: Family Medicine

## 2021-09-16 ENCOUNTER — Ambulatory Visit (INDEPENDENT_AMBULATORY_CARE_PROVIDER_SITE_OTHER): Payer: 59 | Admitting: Family Medicine

## 2021-09-16 VITALS — BP 152/92 | HR 84 | Ht 62.0 in | Wt 177.1 lb

## 2021-09-16 DIAGNOSIS — E669 Obesity, unspecified: Secondary | ICD-10-CM | POA: Diagnosis not present

## 2021-09-16 DIAGNOSIS — M5440 Lumbago with sciatica, unspecified side: Secondary | ICD-10-CM | POA: Diagnosis not present

## 2021-09-16 DIAGNOSIS — I10 Essential (primary) hypertension: Secondary | ICD-10-CM

## 2021-09-16 DIAGNOSIS — Z124 Encounter for screening for malignant neoplasm of cervix: Secondary | ICD-10-CM

## 2021-09-16 DIAGNOSIS — Z0001 Encounter for general adult medical examination with abnormal findings: Secondary | ICD-10-CM

## 2021-09-16 DIAGNOSIS — E66811 Obesity, class 1: Secondary | ICD-10-CM

## 2021-09-16 DIAGNOSIS — M545 Low back pain, unspecified: Secondary | ICD-10-CM

## 2021-09-16 DIAGNOSIS — J309 Allergic rhinitis, unspecified: Secondary | ICD-10-CM

## 2021-09-16 DIAGNOSIS — F17200 Nicotine dependence, unspecified, uncomplicated: Secondary | ICD-10-CM

## 2021-09-16 DIAGNOSIS — M549 Dorsalgia, unspecified: Secondary | ICD-10-CM | POA: Insufficient documentation

## 2021-09-16 DIAGNOSIS — G8929 Other chronic pain: Secondary | ICD-10-CM | POA: Diagnosis not present

## 2021-09-16 MED ORDER — AMLODIPINE BESYLATE 10 MG PO TABS
10.0000 mg | ORAL_TABLET | Freq: Every day | ORAL | 0 refills | Status: DC
  Filled 2021-09-16: qty 60, 60d supply, fill #0

## 2021-09-16 MED ORDER — SEMAGLUTIDE-WEIGHT MANAGEMENT 0.25 MG/0.5ML ~~LOC~~ SOAJ
0.2500 mg | SUBCUTANEOUS | 0 refills | Status: DC
  Filled 2021-09-16 – 2021-10-17 (×2): qty 2, 28d supply, fill #0

## 2021-09-16 MED ORDER — CYCLOBENZAPRINE HCL 10 MG PO TABS
10.0000 mg | ORAL_TABLET | Freq: Three times a day (TID) | ORAL | 5 refills | Status: DC
  Filled 2021-09-16 – 2021-10-06 (×2): qty 90, 30d supply, fill #0
  Filled 2021-11-11: qty 90, 30d supply, fill #1
  Filled 2021-12-24: qty 90, 30d supply, fill #2
  Filled 2022-02-12: qty 90, 30d supply, fill #3
  Filled 2022-05-04 – 2022-05-21 (×2): qty 90, 30d supply, fill #4
  Filled 2022-08-18: qty 90, 30d supply, fill #5

## 2021-09-16 MED ORDER — KETOROLAC TROMETHAMINE 60 MG/2ML IM SOLN
60.0000 mg | Freq: Once | INTRAMUSCULAR | Status: AC
  Administered 2021-09-16: 60 mg via INTRAMUSCULAR

## 2021-09-16 MED ORDER — SEMAGLUTIDE-WEIGHT MANAGEMENT 0.5 MG/0.5ML ~~LOC~~ SOAJ
0.5000 mg | SUBCUTANEOUS | 0 refills | Status: DC
  Filled 2021-09-16: qty 2, 28d supply, fill #0

## 2021-09-16 MED ORDER — NICOTINE 21 MG/24HR TD PT24
21.0000 mg | MEDICATED_PATCH | Freq: Every day | TRANSDERMAL | 0 refills | Status: DC
  Filled 2021-09-16: qty 28, 28d supply, fill #0

## 2021-09-16 MED ORDER — NICOTINE 14 MG/24HR TD PT24
14.0000 mg | MEDICATED_PATCH | Freq: Every day | TRANSDERMAL | 0 refills | Status: DC
  Filled 2021-09-16: qty 28, 28d supply, fill #0

## 2021-09-16 MED ORDER — PHENTERMINE HCL 37.5 MG PO TABS
37.5000 mg | ORAL_TABLET | Freq: Every day | ORAL | 1 refills | Status: DC
  Filled 2021-09-16: qty 30, 30d supply, fill #0
  Filled 2021-10-15: qty 30, 30d supply, fill #1

## 2021-09-16 MED ORDER — XHANCE 93 MCG/ACT NA EXHU
1.0000 | INHALANT_SUSPENSION | Freq: Two times a day (BID) | NASAL | 2 refills | Status: DC
  Filled 2021-09-16: qty 16, 30d supply, fill #0

## 2021-09-16 MED ORDER — SPIRIVA HANDIHALER 18 MCG IN CAPS
18.0000 ug | ORAL_CAPSULE | Freq: Every day | RESPIRATORY_TRACT | 3 refills | Status: DC
  Filled 2021-09-16: qty 30, 30d supply, fill #0

## 2021-09-16 NOTE — Assessment & Plan Note (Signed)
Current 1 PPD Start nicoderm patches Asked:confirms currently smokes cigarettes Assess: Unwilling to set a quit date, but is cutting back Advise: needs to QUIT to reduce risk of cancer, cardio and cerebrovascular disease Assist: counseled for 5 minutes and literature provided Arrange: follow up in 2 to 4 months

## 2021-09-16 NOTE — Patient Instructions (Addendum)
F/u in 6 to 7 weeks , re evaluate weight and blood pressure, call if you need me sooner  Toradol 60 mg iM in office for back pain   New higher dose of amlodipine 10 mg one daily  You will be referred to pain clinic for sooner appt due to uncontrolled pain  New is phentermine one daily to help with weight loss uNTIL wegovy becomes avail;able, once avail;able , stop the phentermine  New are nicoderm patches once you start them sTOP smoking  Think about what you will eat, plan ahead. Choose " clean, green, fresh or frozen" over canned, processed or packaged foods which are more sugary, salty and fatty. 70 to 75% of food eaten should be vegetables and fruit. Three meals at set times with snacks allowed between meals, but they must be fruit or vegetables. Aim to eat over a 12 hour period , example 7 am to 7 pm, and STOP after  your last meal of the day. Drink water,generally about 64 ounces per day, no other drink is as healthy. Fruit juice is best enjoyed in a healthy way, by EATING the fruit. Carefgul, no more falls!  Thanks for choosing [facility], we consider it a privelige to serve you.  Marland Kitchen

## 2021-09-16 NOTE — Assessment & Plan Note (Signed)
  Patient re-educated about  the importance of commitment to a  minimum of 150 minutes of exercise per week as able.  The importance of healthy food choices with portion control discussed, as well as eating regularly and within a 12 hour window most days. The need to choose "clean , green" food 50 to 75% of the time is discussed, as well as to make water the primary drink and set a goal of 64 ounces water daily.       09/16/2021    8:46 AM 04/18/2021    8:39 AM 04/10/2021   10:44 AM  Weight /BMI  Weight 177 lb 1.3 oz 173 lb 1.9 oz 178 lb  Height '5\' 2"'$  (1.575 m) '5\' 2"'$  (1.575 m) '5\' 2"'$  (1.575 m)  BMI 32.39 kg/m2 31.66 kg/m2 32.56 kg/m2   Start phentermine until wegovy becomes available

## 2021-09-16 NOTE — Assessment & Plan Note (Signed)
toradol 60 mg IM in office, fell today and has increased low back pain

## 2021-09-16 NOTE — Assessment & Plan Note (Signed)
Uncontrolled, increase to amlodipine 10 mg daily DASH diet and commitment to daily physical activity for a minimum of 30 minutes discussed and encouraged, as a part of hypertension management. The importance of attaining a healthy weight is also discussed.     09/16/2021    9:31 AM 09/16/2021    9:30 AM 09/16/2021    8:46 AM 04/18/2021    9:11 AM 04/18/2021    8:39 AM 04/10/2021   10:44 AM 01/02/2021   11:33 AM  BP/Weight  Systolic BP 634 949 447 395 844  171  Diastolic BP 92 278 82 80 83  85  Wt. (Lbs)   177.08  173.12 178 178  BMI   32.39 kg/m2  31.66 kg/m2 32.56 kg/m2 32.56 kg/m2

## 2021-09-16 NOTE — Assessment & Plan Note (Signed)
Uncontrolled , start daily flonase/ xhance

## 2021-09-16 NOTE — Assessment & Plan Note (Signed)

## 2021-09-16 NOTE — Progress Notes (Signed)
Zoe Bailey     MRN: [id number]      DOB: [birth year]  HPI: Patient is in for annual physical exam. Generalized pain, worse in ankles , needs surgery , gait affected , recurrent fall. Needs sooner appt at pain clini as in pain all the time Back pain ciurrently between 8 to 10, slipped on water in her home today and reports 6  falls since last visit Intolerant of wellbutrin, wants the patch to help her stop smoking , back up to 1 PPD Wants wegovy for weight loss , esp as needs ankle surgeryu , will start with phentermine. Uncontrolled allergy symptoms will commit to daily nasal spray Needs sooner appt at pain clinic as reports excess/ overuse / incorrect use of meds for her pain Uncontrolled blood pressure is addressed Immunization is reviewed , and  updated if needed.   PE: BP (!) 152/92   Pulse 84   Ht '5\' 2"'$  (1.575 m)   Wt 177 lb 1.3 oz (80.3 kg)   LMP 04/09/2017 (Approximate)   SpO2 93%   BMI 32.39 kg/m   Pleasant  female, alert and oriented x 3, in no cardio-pulmonary distress. Afebrile. HEENT No facial trauma or asymetry. Sinuses non tender.  Extra occullar muscles intact.. External ears normal, . Neck: supple, no adenopathy,JVD or thyromegaly.No bruits.  Chest: Clear to ascultation bilaterally.No crackles or wheezes. Non tender to palpation  Breast: No asymetry,no masses or lumps. No tenderness. No nipple discharge or inversion. No axillary or supraclavicular adenopathy  Cardiovascular system; Heart sounds normal,  S1 and  S2 ,no S3.  No murmur, or thrill. Apical beat not displaced Peripheral pulses normal.  Abdomen: Soft, non tender, no organomegaly or masses. No bruits. Bowel sounds normal. No guarding, tenderness or rebound.   GU: External genitalia normal female genitalia , normal female distribution of hair. No lesions. Urethral meatus normal in size, no  Prolapse, no lesions visibly  Present. Bladder non tender. Vagina pink and moist , with  no visible lesions , discharge present . Adequate pelvic support no  cystocele or rectocele noted Cervix pink and appears healthy, no lesions or ulcerations noted, no discharge noted from os Uterus normal size, no adnexal masses, no cervical motion or adnexal tenderness.   Musculoskeletal exam: dEcreased  ROM of spine, hips , ankles  and knees. deformity ,swelling or crepitus noted. No muscle wasting or atrophy.   Neurologic: Cranial nerves 2 to 12 intact. Power, tone ,sensation and reflexes normal throughout. disturbance in gait. No tremor.  Skin: Intact, no ulceration, erythema , scaling or rash noted. Pigmentation normal throughout  Psych; Normal mood and affect. Judgement and concentration normal   Assessment & Plan:  Annual visit for general adult medical examination with abnormal findings Annual exam as documented. Counseling done  re healthy lifestyle involving commitment to 150 minutes exercise per week, heart healthy diet, and attaining healthy weight.The importance of adequate sleep also discussed. Regular seat belt use and home safety, is also discussed. Changes in health habits are decided on by the patient with goals and time frames  set for achieving them. Immunization and cancer screening needs are specifically addressed at this visit.   Essential hypertension Uncontrolled, increase to amlodipine 10 mg daily DASH diet and commitment to daily physical activity for a minimum of 30 minutes discussed and encouraged, as a part of hypertension management. The importance of attaining a healthy weight is also discussed.     09/16/2021    9:31  AM 09/16/2021    9:30 AM 09/16/2021    8:46 AM 04/18/2021    9:11 AM 04/18/2021    8:39 AM 04/10/2021   10:44 AM 01/02/2021   11:33 AM  BP/Weight  Systolic BP 662 947 654 650 354  656  Diastolic BP 92 812 82 80 83  85  Wt. (Lbs)   177.08  173.12 178 178  BMI   32.39 kg/m2  31.66 kg/m2 32.56 kg/m2 32.56 kg/m2        NICOTINE ADDICTION Current 1 PPD Start nicoderm patches Asked:confirms currently smokes cigarettes Assess: Unwilling to set a quit date, but is cutting back Advise: needs to QUIT to reduce risk of cancer, cardio and cerebrovascular disease Assist: counseled for 5 minutes and literature provided Arrange: follow up in 2 to 4 months   Obesity (BMI 30.0-34.9)  Patient re-educated about  the importance of commitment to a  minimum of 150 minutes of exercise per week as able.  The importance of healthy food choices with portion control discussed, as well as eating regularly and within a 12 hour window most days. The need to choose "clean , green" food 50 to 75% of the time is discussed, as well as to make water the primary drink and set a goal of 64 ounces water daily.       09/16/2021    8:46 AM 04/18/2021    8:39 AM 04/10/2021   10:44 AM  Weight /BMI  Weight 177 lb 1.3 oz 173 lb 1.9 oz 178 lb  Height '5\' 2"'$  (1.575 m) '5\' 2"'$  (1.575 m) '5\' 2"'$  (1.575 m)  BMI 32.39 kg/m2 31.66 kg/m2 32.56 kg/m2   Start phentermine until wegovy becomes available  Back pain toradol 60 mg IM in office, fell today and has increased low back pain  Allergic rhinitis Uncontrolled , start daily flonase/ xhance

## 2021-09-22 ENCOUNTER — Other Ambulatory Visit: Payer: Self-pay | Admitting: Family Medicine

## 2021-09-22 DIAGNOSIS — R87610 Atypical squamous cells of undetermined significance on cytologic smear of cervix (ASC-US): Secondary | ICD-10-CM

## 2021-09-22 LAB — CYTOLOGY - PAP
Comment: NEGATIVE
Comment: NEGATIVE
Comment: NEGATIVE
Diagnosis: UNDETERMINED — AB
HPV 16: NEGATIVE
HPV 18 / 45: NEGATIVE
High risk HPV: POSITIVE — AB

## 2021-09-25 ENCOUNTER — Other Ambulatory Visit (HOSPITAL_COMMUNITY)
Admission: RE | Admit: 2021-09-25 | Discharge: 2021-09-25 | Disposition: A | Discharge status: Home / Self Care | Payer: 59 | Source: Ambulatory Visit | Attending: Obstetrics & Gynecology | Admitting: Obstetrics & Gynecology

## 2021-09-25 ENCOUNTER — Ambulatory Visit: Payer: 59 | Admitting: Obstetrics & Gynecology

## 2021-09-25 ENCOUNTER — Encounter: Payer: Self-pay | Admitting: Obstetrics & Gynecology

## 2021-09-25 VITALS — BP 137/89 | HR 103 | Ht 62.0 in | Wt 177.8 lb

## 2021-09-25 DIAGNOSIS — R87810 Cervical high risk human papillomavirus (HPV) DNA test positive: Secondary | ICD-10-CM | POA: Diagnosis not present

## 2021-09-25 DIAGNOSIS — N888 Other specified noninflammatory disorders of cervix uteri: Secondary | ICD-10-CM | POA: Diagnosis not present

## 2021-09-25 DIAGNOSIS — R87610 Atypical squamous cells of undetermined significance on cytologic smear of cervix (ASC-US): Secondary | ICD-10-CM | POA: Insufficient documentation

## 2021-09-25 DIAGNOSIS — N72 Inflammatory disease of cervix uteri: Secondary | ICD-10-CM | POA: Diagnosis not present

## 2021-09-25 DIAGNOSIS — M79672 Pain in left foot: Secondary | ICD-10-CM | POA: Diagnosis not present

## 2021-09-25 DIAGNOSIS — G4700 Insomnia, unspecified: Secondary | ICD-10-CM | POA: Diagnosis not present

## 2021-09-25 DIAGNOSIS — M25569 Pain in unspecified knee: Secondary | ICD-10-CM | POA: Diagnosis not present

## 2021-09-25 DIAGNOSIS — M545 Low back pain, unspecified: Secondary | ICD-10-CM | POA: Diagnosis not present

## 2021-09-25 DIAGNOSIS — Z30432 Encounter for removal of intrauterine contraceptive device: Secondary | ICD-10-CM

## 2021-09-25 DIAGNOSIS — N870 Mild cervical dysplasia: Secondary | ICD-10-CM | POA: Diagnosis not present

## 2021-09-25 DIAGNOSIS — G8929 Other chronic pain: Secondary | ICD-10-CM | POA: Diagnosis not present

## 2021-09-25 DIAGNOSIS — M25539 Pain in unspecified wrist: Secondary | ICD-10-CM | POA: Diagnosis not present

## 2021-09-25 DIAGNOSIS — Z79891 Long term (current) use of opiate analgesic: Secondary | ICD-10-CM | POA: Diagnosis not present

## 2021-09-25 NOTE — Progress Notes (Signed)
Patient name: Zoe Bailey MRN [id number]  Date of birth: 1965-03-27 Chief Complaint:   Colposcopy  History of Present Illness:   Zoe Bailey is a 56 y.o. G1P1 PM female being seen today for consultation from PCP-Dr. Moshe Cipro due to cervical dysplasia management.  Pap records reviewd 2023- ASCUS, HPV pos (16/18 neg) 2022- Pap neg, HPV pos  Pt is postmenopausal denies vaginal bleeding.  Not sexually active.  Denies pelvic or abdominal pain.  Denies vaginal discharge, itching or irritation.  Smoker:  Yes.   Working to quit smoking, currently using patch New sexual partner:  No.   History of abnormal Pap: yes  Patient's last menstrual period was 04/09/2017 (approximate).     09/25/2021    2:17 PM 09/16/2021    8:52 AM 04/18/2021    8:39 AM 09/05/2020   10:33 AM 10/05/2019    8:09 AM  Depression screen PHQ 2/9  Decreased Interest 2 0 0 0 0  Down, Depressed, Hopeless 1 0 0 0 0  PHQ - 2 Score 3 0 0 0 0  Altered sleeping 2      Tired, decreased energy 2      Change in appetite 1      Feeling bad or failure about yourself  0      Trouble concentrating 0      Moving slowly or fidgety/restless 0      Suicidal thoughts 0      PHQ-9 Score 8         Review of Systems:   Pertinent items are noted in HPI Denies fever/chills, dizziness, headaches, visual disturbances, fatigue, shortness of breath, chest pain, abdominal pain, vomiting, no problems with bowel movements, urination, or intercourse unless otherwise stated above.  Pertinent History Reviewed:  Reviewed past medical,surgical, social, obstetrical and family history.  Reviewed problem list, medications and allergies. Physical Assessment:   Vitals:   09/25/21 1414  BP: 137/89  Pulse: (!) 103  Weight: 177 lb 12.8 oz (80.6 kg)  Height: '5\' 2"'$  (1.575 m)  Body mass index is 32.52 kg/m.       Physical Examination:   General appearance: alert, well appearing, and in no distress  Psych: mood appropriate, normal affect  Skin:  warm & dry   Cardiovascular: normal heart rate noted  Respiratory: normal respiratory effort, no distress  Abdomen: soft, non-tender   Pelvic: normal external genitalia, vulva, vagina, cervix- IUD visualized, uterus and adnexa  Extremities: 1+ edema, bilateral braces  Chaperone: Celene Squibb     Colposcopy and IUD Removal Procedure Note  Indications: IUD in place, ASCUS, HPV+    Procedure Details  The risks and benefits of the procedure and Written informed consent obtained.  Ring forceps were used to grasp and remove the copper IUD without complications.  Minimal bleeding noted.  Speculum placed in vagina and excellent visualization of cervix achieved, cervix swabbed x 3 with acetic acid solution.  Findings: Adequate colposcopy is noted today.  TMZ zone present  Cervix: acetowhite changes noted mostly from 3 to 9 o'oclock; ECC and cervical biopsies obtained.    Monsel's applied.  Adequate hemostasis noted  Specimens: ECC and cervical biopsies  Complications: none.  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

## 2021-09-29 LAB — SURGICAL PATHOLOGY

## 2021-09-30 ENCOUNTER — Other Ambulatory Visit (HOSPITAL_COMMUNITY): Payer: Self-pay

## 2021-09-30 MED ORDER — DULOXETINE HCL 60 MG PO CPEP
60.0000 mg | ORAL_CAPSULE | Freq: Two times a day (BID) | ORAL | 0 refills | Status: DC
  Filled 2021-09-30 – 2021-11-19 (×2): qty 180, 90d supply, fill #0

## 2021-09-30 MED ORDER — HYDROXYZINE HCL 25 MG PO TABS
25.0000 mg | ORAL_TABLET | Freq: Every day | ORAL | 0 refills | Status: DC
  Filled 2021-09-30: qty 90, 90d supply, fill #0

## 2021-09-30 MED ORDER — HYDROCODONE-ACETAMINOPHEN 10-325 MG PO TABS
1.0000 | ORAL_TABLET | Freq: Three times a day (TID) | ORAL | 0 refills | Status: DC | PRN
  Filled 2021-09-30 (×2): qty 90, 30d supply, fill #0

## 2021-09-30 MED ORDER — GABAPENTIN 400 MG PO CAPS
400.0000 mg | ORAL_CAPSULE | Freq: Four times a day (QID) | ORAL | 2 refills | Status: DC | PRN
  Filled 2021-09-30: qty 120, 30d supply, fill #0
  Filled 2021-11-04: qty 120, 30d supply, fill #1
  Filled 2022-02-12: qty 120, 30d supply, fill #2

## 2021-10-06 ENCOUNTER — Other Ambulatory Visit (HOSPITAL_COMMUNITY): Payer: Self-pay

## 2021-10-06 ENCOUNTER — Encounter: Payer: Self-pay | Admitting: Family Medicine

## 2021-10-07 ENCOUNTER — Telehealth: Payer: Self-pay | Admitting: Orthopedic Surgery

## 2021-10-07 NOTE — Telephone Encounter (Signed)
I called and left a message asking Zoe Bailey to return my call to discuss scheduling her subtalar fusion for September.

## 2021-10-09 DIAGNOSIS — M542 Cervicalgia: Secondary | ICD-10-CM | POA: Diagnosis not present

## 2021-10-09 DIAGNOSIS — M791 Myalgia, unspecified site: Secondary | ICD-10-CM | POA: Diagnosis not present

## 2021-10-09 DIAGNOSIS — G43719 Chronic migraine without aura, intractable, without status migrainosus: Secondary | ICD-10-CM | POA: Diagnosis not present

## 2021-10-14 ENCOUNTER — Other Ambulatory Visit (HOSPITAL_COMMUNITY): Payer: Self-pay

## 2021-10-15 ENCOUNTER — Other Ambulatory Visit (HOSPITAL_COMMUNITY): Payer: Self-pay

## 2021-10-16 ENCOUNTER — Ambulatory Visit (INDEPENDENT_AMBULATORY_CARE_PROVIDER_SITE_OTHER): Payer: 59 | Admitting: Orthopedic Surgery

## 2021-10-16 ENCOUNTER — Encounter: Payer: Self-pay | Admitting: Orthopedic Surgery

## 2021-10-16 DIAGNOSIS — G8929 Other chronic pain: Secondary | ICD-10-CM

## 2021-10-16 DIAGNOSIS — M1712 Unilateral primary osteoarthritis, left knee: Secondary | ICD-10-CM

## 2021-10-16 DIAGNOSIS — M1711 Unilateral primary osteoarthritis, right knee: Secondary | ICD-10-CM

## 2021-10-16 MED ORDER — SODIUM HYALURONATE (VISCOSUP) 20 MG/2ML IX SOSY
20.0000 mg | PREFILLED_SYRINGE | Freq: Once | INTRA_ARTICULAR | Status: AC
  Administered 2021-10-16: 20 mg via INTRA_ARTICULAR

## 2021-10-16 NOTE — Progress Notes (Addendum)
FOLLOW UP   Encounter Diagnoses  Name Primary?   Chronic pain of right knee    Arthritis of left knee Yes     Chief Complaint  Patient presents with   Injections    Right Euflexxa #1     Euflexxa No. 1 right knee  Skin prepped with alcohol and ethyl chloride patient gave consent for right knee injection  Lateral approach was used to inject Euflexxa No. 1 of 3 into the right knee no complications noted  Zoe Bailey will follow-up in 1 week

## 2021-10-17 ENCOUNTER — Other Ambulatory Visit (HOSPITAL_COMMUNITY): Payer: Self-pay

## 2021-10-20 ENCOUNTER — Other Ambulatory Visit (HOSPITAL_COMMUNITY): Payer: Self-pay

## 2021-10-20 MED ORDER — HYDROCODONE-ACETAMINOPHEN 10-325 MG PO TABS
1.0000 | ORAL_TABLET | Freq: Three times a day (TID) | ORAL | 0 refills | Status: DC | PRN
  Filled 2021-10-20: qty 90, 30d supply, fill #0

## 2021-10-23 ENCOUNTER — Encounter: Payer: Self-pay | Admitting: Orthopedic Surgery

## 2021-10-23 ENCOUNTER — Ambulatory Visit (INDEPENDENT_AMBULATORY_CARE_PROVIDER_SITE_OTHER): Payer: 59 | Admitting: Orthopedic Surgery

## 2021-10-23 DIAGNOSIS — M1711 Unilateral primary osteoarthritis, right knee: Secondary | ICD-10-CM | POA: Diagnosis not present

## 2021-10-23 DIAGNOSIS — G8929 Other chronic pain: Secondary | ICD-10-CM

## 2021-10-23 MED ORDER — SODIUM HYALURONATE (VISCOSUP) 20 MG/2ML IX SOSY
20.0000 mg | PREFILLED_SYRINGE | Freq: Once | INTRA_ARTICULAR | Status: AC
  Administered 2021-10-23: 20 mg via INTRA_ARTICULAR

## 2021-10-23 NOTE — Progress Notes (Signed)
Chief Complaint  Patient presents with   Injections    Euflexxa #2 Right knee    Encounter Diagnoses  Name Primary?   Chronic pain of right knee    Arthritis of right knee Yes    Injection HA # 2/3  KNEE RIGHT   The patient has osteoarthritis of the knee. She has been on anti-inflammatories and had cortisone injection and has failed that conservative treatment.  The patient has been approved and wishes to try hyaluronic acid injection in the form EUFLEXA The RIGHT  knee is prepped with alcohol and ethyl chloride  The injection is performed with a 21-gauge needle, via inferolateral approach  No complications were noted  Appropriate instructions post injection were given  NEXT WK

## 2021-10-28 ENCOUNTER — Encounter: Payer: Self-pay | Admitting: Family Medicine

## 2021-10-28 ENCOUNTER — Ambulatory Visit: Payer: 59 | Admitting: Family Medicine

## 2021-10-28 ENCOUNTER — Other Ambulatory Visit (HOSPITAL_COMMUNITY): Payer: Self-pay

## 2021-10-28 VITALS — BP 136/86 | HR 88 | Ht 61.0 in | Wt 171.0 lb

## 2021-10-28 DIAGNOSIS — E559 Vitamin D deficiency, unspecified: Secondary | ICD-10-CM | POA: Diagnosis not present

## 2021-10-28 DIAGNOSIS — I10 Essential (primary) hypertension: Secondary | ICD-10-CM | POA: Diagnosis not present

## 2021-10-28 DIAGNOSIS — E669 Obesity, unspecified: Secondary | ICD-10-CM

## 2021-10-28 DIAGNOSIS — F17200 Nicotine dependence, unspecified, uncomplicated: Secondary | ICD-10-CM | POA: Diagnosis not present

## 2021-10-28 DIAGNOSIS — F418 Other specified anxiety disorders: Secondary | ICD-10-CM

## 2021-10-28 MED ORDER — ERGOCALCIFEROL 1.25 MG (50000 UT) PO CAPS
50000.0000 [IU] | ORAL_CAPSULE | ORAL | 2 refills | Status: DC
  Filled 2021-10-28: qty 12, 84d supply, fill #0
  Filled 2022-01-13: qty 12, 84d supply, fill #1
  Filled 2022-04-03: qty 12, 84d supply, fill #2

## 2021-10-28 NOTE — Progress Notes (Signed)
Zoe Bailey     MRN: [id number]      DOB: 10-03-1965   HPI Zoe Bailey is here for follow up and re-evaluation of chronic medical conditions, medication management and review of any available recent lab and radiology data.  Preventive health is updated, specifically  Cancer screening and Immunization.   Plans foot surgery around Novemeber. Good weight loss and thinks it will be even better with wegovy, which she states also takes her nicotine craving away   ROS Denies recent fever or chills. Denies sinus pressure, nasal congestion, ear pain or sore throat. Denies chest congestion, productive cough or wheezing. Denies chest pains, palpitations and leg swelling Denies abdominal pain, nausea, vomiting,diarrhea or constipation.   Denies dysuria, frequency, hesitancy or incontinence. Denies joint pain, swelling and limitation in mobility. Denies headaches, seizures, numbness, or tingling. Denies depression, anxiety or insomnia. Denies skin break down or rash.   PE  BP 136/86   Pulse 88   Ht '5\' 1"'$  (1.549 m)   Wt 171 lb (77.6 kg)   LMP 04/09/2017 (Approximate)   SpO2 95%   BMI 32.31 kg/m   Patient alert and oriented and in no cardiopulmonary distress.  HEENT: No facial asymmetry, EOMI,     Neck supple .  Chest: Clear to auscultation bilaterally.  CVS: S1, S2 no murmurs, no S3.Regular rate.  ABD: Soft non tender.   Ext: No edema  MS: Adequate ROM spine, shoulders, hips and knees.  Skin: Intact, no ulcerations or rash noted.  Psych: Good eye contact, normal affect. Memory intact not anxious or depressed appearing.  CNS: CN 2-12 intact, power,  normal throughout.no focal deficits noted.   Assessment & Plan  Essential hypertension Improved, no med change DASH diet and commitment to daily physical activity for a minimum of 30 minutes discussed and encouraged, as a part of hypertension management. The importance of attaining a healthy weight is also discussed.      10/28/2021    9:55 AM 10/28/2021    9:25 AM 09/25/2021    2:14 PM 09/16/2021    9:31 AM 09/16/2021    9:30 AM 09/16/2021    8:46 AM 04/18/2021    9:11 AM  BP/Weight  Systolic BP 254 270 623 762 [phone]  Diastolic BP 86 86 89 92 073 82 80  Wt. (Lbs)  171 177.8   177.08   BMI  32.31 kg/m2 32.52 kg/m2   32.39 kg/m2      '  NICOTINE ADDICTION Asked:confirms currently smokes cigarettes on avg 1/day Assess: Unwilling to set a quit date, but is cutting back, may quit Sept 1 Advise: needs to QUIT to reduce risk of cancer, cardio and cerebrovascular disease Assist: counseled for 5 minutes and literature provided Arrange: follow up in 2 to 4 months   Obesity (BMI 30.0-34.9) Improved, continue wegovy  Patient re-educated about  the importance of commitment to a  minimum of 150 minutes of exercise per week as able.  The importance of healthy food choices with portion control discussed, as well as eating regularly and within a 12 hour window most days. The need to choose "clean , green" food 50 to 75% of the time is discussed, as well as to make water the primary drink and set a goal of 64 ounces water daily.       10/28/2021    9:25 AM 09/25/2021    2:14 PM 09/16/2021    8:46 AM  Weight /BMI  Weight 171 lb  177 lb 12.8 oz 177 lb 1.3 oz  Height '5\' 1"'$  (1.549 m) '5\' 2"'$  (1.575 m) '5\' 2"'$  (1.575 m)  BMI 32.31 kg/m2 32.52 kg/m2 32.39 kg/m2      Depression with anxiety Controlled, no change in medication   Vitamin D deficiency Start once weekly vit D

## 2021-10-28 NOTE — Assessment & Plan Note (Signed)
Improved, continue wegovy  Patient re-educated about  the importance of commitment to a  minimum of 150 minutes of exercise per week as able.  The importance of healthy food choices with portion control discussed, as well as eating regularly and within a 12 hour window most days. The need to choose "clean , green" food 50 to 75% of the time is discussed, as well as to make water the primary drink and set a goal of 64 ounces water daily.       10/28/2021    9:25 AM 09/25/2021    2:14 PM 09/16/2021    8:46 AM  Weight /BMI  Weight 171 lb 177 lb 12.8 oz 177 lb 1.3 oz  Height '5\' 1"'$  (1.549 m) '5\' 2"'$  (1.575 m) '5\' 2"'$  (1.575 m)  BMI 32.31 kg/m2 32.52 kg/m2 32.39 kg/m2

## 2021-10-28 NOTE — Assessment & Plan Note (Signed)
Improved, no med change DASH diet and commitment to daily physical activity for a minimum of 30 minutes discussed and encouraged, as a part of hypertension management. The importance of attaining a healthy weight is also discussed.     10/28/2021    9:55 AM 10/28/2021    9:25 AM 09/25/2021    2:14 PM 09/16/2021    9:31 AM 09/16/2021    9:30 AM 09/16/2021    8:46 AM 04/18/2021    9:11 AM  BP/Weight  Systolic BP 263 785 885 [phone] 867  Diastolic BP 86 86 89 92 672 82 80  Wt. (Lbs)  171 177.8   177.08   BMI  32.31 kg/m2 32.52 kg/m2   32.39 kg/m2      '

## 2021-10-28 NOTE — Addendum Note (Signed)
Addended by: Fayrene Helper on: 10/28/2021 05:55 PM   Modules accepted: Level of Service

## 2021-10-28 NOTE — Assessment & Plan Note (Signed)
Controlled, no change in medication  

## 2021-10-28 NOTE — Patient Instructions (Addendum)
F/u end October call if you need me sooner, re eval  BOP and weight   CONGRATS on weight loss and smoking much less,keep both up!  New is once weekly vit D  Thankful that you feel better overall  Thanks for choosing [facility], we consider it a privelige to serve you.

## 2021-10-28 NOTE — Assessment & Plan Note (Signed)
Asked:confirms currently smokes cigarettes on avg 1/day Assess: Unwilling to set a quit date, but is cutting back, may quit Sept 1 Advise: needs to QUIT to reduce risk of cancer, cardio and cerebrovascular disease Assist: counseled for 5 minutes and literature provided Arrange: follow up in 2 to 4 months

## 2021-10-28 NOTE — Assessment & Plan Note (Signed)
Start once weekly vit D

## 2021-10-30 ENCOUNTER — Encounter: Payer: Self-pay | Admitting: Orthopedic Surgery

## 2021-10-30 ENCOUNTER — Ambulatory Visit (INDEPENDENT_AMBULATORY_CARE_PROVIDER_SITE_OTHER): Payer: 59 | Admitting: Orthopedic Surgery

## 2021-10-30 DIAGNOSIS — M1711 Unilateral primary osteoarthritis, right knee: Secondary | ICD-10-CM

## 2021-10-30 DIAGNOSIS — G8929 Other chronic pain: Secondary | ICD-10-CM

## 2021-10-30 MED ORDER — SODIUM HYALURONATE (VISCOSUP) 20 MG/2ML IX SOSY
20.0000 mg | PREFILLED_SYRINGE | Freq: Once | INTRA_ARTICULAR | Status: AC
  Administered 2021-10-30: 20 mg via INTRA_ARTICULAR

## 2021-10-30 NOTE — Progress Notes (Signed)
Injection visit  Euflexxa injection right knee #3  Osteoarthritis right knee  The patient consented for injection.  Site was confirmed.  Medication confirmed.  Knee placed in flexion.  Knee cleaned with alcohol sprayed with ethyl chloride lateral approach  20-gauge needle used to inject through the lateral parapatellar region  No complications  Patient will call us when she needs her next injection

## 2021-11-04 ENCOUNTER — Other Ambulatory Visit (HOSPITAL_COMMUNITY): Payer: Self-pay

## 2021-11-11 ENCOUNTER — Other Ambulatory Visit (HOSPITAL_COMMUNITY): Payer: Self-pay

## 2021-11-11 ENCOUNTER — Other Ambulatory Visit: Payer: Self-pay | Admitting: Family Medicine

## 2021-11-11 MED ORDER — WEGOVY 0.25 MG/0.5ML ~~LOC~~ SOAJ
0.2500 mg | SUBCUTANEOUS | 0 refills | Status: DC
  Filled 2021-11-11: qty 2, 28d supply, fill #0

## 2021-11-13 ENCOUNTER — Other Ambulatory Visit (HOSPITAL_COMMUNITY): Payer: Self-pay

## 2021-11-19 ENCOUNTER — Other Ambulatory Visit (HOSPITAL_COMMUNITY): Payer: Self-pay

## 2021-11-20 ENCOUNTER — Other Ambulatory Visit (HOSPITAL_COMMUNITY): Payer: Self-pay

## 2021-11-20 DIAGNOSIS — M25569 Pain in unspecified knee: Secondary | ICD-10-CM | POA: Diagnosis not present

## 2021-11-20 DIAGNOSIS — G4700 Insomnia, unspecified: Secondary | ICD-10-CM | POA: Diagnosis not present

## 2021-11-20 DIAGNOSIS — Z79891 Long term (current) use of opiate analgesic: Secondary | ICD-10-CM | POA: Diagnosis not present

## 2021-11-20 DIAGNOSIS — M545 Low back pain, unspecified: Secondary | ICD-10-CM | POA: Diagnosis not present

## 2021-11-20 DIAGNOSIS — M25539 Pain in unspecified wrist: Secondary | ICD-10-CM | POA: Diagnosis not present

## 2021-11-20 DIAGNOSIS — G8929 Other chronic pain: Secondary | ICD-10-CM | POA: Diagnosis not present

## 2021-11-20 DIAGNOSIS — M79672 Pain in left foot: Secondary | ICD-10-CM | POA: Diagnosis not present

## 2021-11-20 DIAGNOSIS — M25579 Pain in unspecified ankle and joints of unspecified foot: Secondary | ICD-10-CM | POA: Diagnosis not present

## 2021-11-20 MED ORDER — DULOXETINE HCL 60 MG PO CPEP
60.0000 mg | ORAL_CAPSULE | Freq: Two times a day (BID) | ORAL | 4 refills | Status: DC
  Filled 2021-11-20: qty 180, 90d supply, fill #0

## 2021-11-20 MED ORDER — HYDROCODONE-ACETAMINOPHEN 10-325 MG PO TABS
1.0000 | ORAL_TABLET | Freq: Three times a day (TID) | ORAL | 0 refills | Status: DC
  Filled 2021-12-29: qty 90, 30d supply, fill #0

## 2021-11-20 MED ORDER — HYDROCODONE-ACETAMINOPHEN 10-325 MG PO TABS: 1.0000 | ORAL_TABLET | Freq: Three times a day (TID) | ORAL | 0 refills | Status: DC | PRN

## 2021-11-20 MED ORDER — HYDROCODONE-ACETAMINOPHEN 10-325 MG PO TABS
1.0000 | ORAL_TABLET | Freq: Three times a day (TID) | ORAL | 0 refills | Status: DC | PRN
  Filled 2021-11-26 – 2021-11-27 (×2): qty 90, 30d supply, fill #0

## 2021-11-20 MED ORDER — GABAPENTIN 400 MG PO CAPS
400.0000 mg | ORAL_CAPSULE | Freq: Four times a day (QID) | ORAL | 11 refills | Status: DC | PRN
  Filled 2021-12-15: qty 120, 30d supply, fill #0

## 2021-11-20 MED ORDER — HYDROXYZINE HCL 25 MG PO TABS
25.0000 mg | ORAL_TABLET | Freq: Every day | ORAL | 4 refills | Status: AC
  Filled 2021-11-20: qty 90, 90d supply, fill #0

## 2021-11-21 ENCOUNTER — Encounter: Payer: Self-pay | Admitting: Family Medicine

## 2021-11-21 ENCOUNTER — Other Ambulatory Visit (HOSPITAL_COMMUNITY): Payer: Self-pay

## 2021-11-21 ENCOUNTER — Other Ambulatory Visit: Payer: Self-pay | Admitting: Family Medicine

## 2021-11-21 MED ORDER — SEMAGLUTIDE-WEIGHT MANAGEMENT 1 MG/0.5ML ~~LOC~~ SOAJ
1.0000 mg | SUBCUTANEOUS | 0 refills | Status: DC
  Filled 2021-11-21: qty 2, 28d supply, fill #0
  Filled 2021-12-15: qty 2, 28d supply, fill #1

## 2021-11-21 NOTE — Progress Notes (Signed)
[place] 1

## 2021-11-24 ENCOUNTER — Other Ambulatory Visit (HOSPITAL_COMMUNITY): Payer: Self-pay

## 2021-11-24 ENCOUNTER — Other Ambulatory Visit: Payer: Self-pay | Admitting: Family Medicine

## 2021-11-24 MED ORDER — DICLOFENAC SODIUM 75 MG PO TBEC
75.0000 mg | DELAYED_RELEASE_TABLET | Freq: Every day | ORAL | 2 refills | Status: DC | PRN
  Filled 2021-11-24 – 2021-12-15 (×2): qty 30, 30d supply, fill #0
  Filled 2022-01-13: qty 30, 30d supply, fill #1
  Filled 2022-03-11: qty 30, 30d supply, fill #2

## 2021-11-25 ENCOUNTER — Telehealth: Payer: Self-pay | Admitting: Orthopedic Surgery

## 2021-11-25 NOTE — Telephone Encounter (Signed)
[employer] forms receivede. To [employer].

## 2021-11-26 ENCOUNTER — Other Ambulatory Visit (HOSPITAL_COMMUNITY): Payer: Self-pay

## 2021-11-27 ENCOUNTER — Other Ambulatory Visit (HOSPITAL_COMMUNITY): Payer: Self-pay

## 2021-11-27 DIAGNOSIS — M791 Myalgia, unspecified site: Secondary | ICD-10-CM | POA: Diagnosis not present

## 2021-11-27 DIAGNOSIS — G43719 Chronic migraine without aura, intractable, without status migrainosus: Secondary | ICD-10-CM | POA: Diagnosis not present

## 2021-11-27 DIAGNOSIS — M542 Cervicalgia: Secondary | ICD-10-CM | POA: Diagnosis not present

## 2021-12-02 ENCOUNTER — Other Ambulatory Visit (HOSPITAL_COMMUNITY): Payer: Self-pay

## 2021-12-15 ENCOUNTER — Other Ambulatory Visit (HOSPITAL_COMMUNITY): Payer: Self-pay

## 2021-12-24 ENCOUNTER — Other Ambulatory Visit (HOSPITAL_COMMUNITY): Payer: Self-pay

## 2021-12-26 ENCOUNTER — Other Ambulatory Visit (HOSPITAL_COMMUNITY): Payer: Self-pay

## 2021-12-29 ENCOUNTER — Other Ambulatory Visit (HOSPITAL_COMMUNITY): Payer: Self-pay

## 2021-12-30 ENCOUNTER — Other Ambulatory Visit (HOSPITAL_COMMUNITY): Payer: Self-pay

## 2021-12-31 ENCOUNTER — Telehealth: Payer: Self-pay | Admitting: Orthopedic Surgery

## 2021-12-31 NOTE — Telephone Encounter (Signed)
[employer] forms received. To [employer]. ?

## 2022-01-06 DIAGNOSIS — G43719 Chronic migraine without aura, intractable, without status migrainosus: Secondary | ICD-10-CM | POA: Diagnosis not present

## 2022-01-06 DIAGNOSIS — M791 Myalgia, unspecified site: Secondary | ICD-10-CM | POA: Diagnosis not present

## 2022-01-06 DIAGNOSIS — M542 Cervicalgia: Secondary | ICD-10-CM | POA: Diagnosis not present

## 2022-01-09 ENCOUNTER — Other Ambulatory Visit (HOSPITAL_COMMUNITY): Payer: Self-pay

## 2022-01-09 ENCOUNTER — Encounter: Payer: Self-pay | Admitting: Pharmacist

## 2022-01-09 ENCOUNTER — Encounter: Payer: Self-pay | Admitting: Family Medicine

## 2022-01-09 ENCOUNTER — Ambulatory Visit: Payer: 59 | Admitting: Family Medicine

## 2022-01-09 VITALS — BP 119/81 | HR 103 | Resp 16 | Ht 61.0 in | Wt 160.1 lb

## 2022-01-09 DIAGNOSIS — F418 Other specified anxiety disorders: Secondary | ICD-10-CM

## 2022-01-09 DIAGNOSIS — G43119 Migraine with aura, intractable, without status migrainosus: Secondary | ICD-10-CM | POA: Diagnosis not present

## 2022-01-09 DIAGNOSIS — I10 Essential (primary) hypertension: Secondary | ICD-10-CM | POA: Diagnosis not present

## 2022-01-09 DIAGNOSIS — E66811 Obesity, class 1: Secondary | ICD-10-CM

## 2022-01-09 DIAGNOSIS — E669 Obesity, unspecified: Secondary | ICD-10-CM

## 2022-01-09 DIAGNOSIS — G894 Chronic pain syndrome: Secondary | ICD-10-CM

## 2022-01-09 DIAGNOSIS — G8929 Other chronic pain: Secondary | ICD-10-CM | POA: Insufficient documentation

## 2022-01-09 MED ORDER — SEMAGLUTIDE-WEIGHT MANAGEMENT 1 MG/0.5ML ~~LOC~~ SOAJ
1.0000 mg | SUBCUTANEOUS | 0 refills | Status: DC
  Filled 2022-01-09: qty 2, 28d supply, fill #0

## 2022-01-09 NOTE — Patient Instructions (Addendum)
Follow-up in 3 months, call if you need me sooner.  All the best with upcoming surgery.  Blood pressure is excellent and you have done extremely well with weight loss.  No changes in current medication.  I will refer you to a new pain clinic.  The doctor operating on  your foot will determine and complete disability or work excuse forms, so please discuss with physician.  Thanks for choosing [facility], we consider it a privelige to serve you.

## 2022-01-09 NOTE — Assessment & Plan Note (Signed)
Currently uncontrolled due to poor health, upcoming surgery with no help at home and recent very poor job performance write up Does not want counseling currently, no med change at this time

## 2022-01-09 NOTE — Assessment & Plan Note (Signed)
Improved, continue weekly wegovy  Patient re-educated about  the importance of commitment to a  minimum of 150 minutes of exercise per week as able.  The importance of healthy food choices with portion control discussed, as well as eating regularly and within a 12 hour window most days. The need to choose "clean , green" food 50 to 75% of the time is discussed, as well as to make water the primary drink and set a goal of 64 ounces water daily.       01/09/2022    4:03 PM 10/28/2021    9:25 AM 09/25/2021    2:14 PM  Weight /BMI  Weight 160 lb 1.9 oz 171 lb 177 lb 12.8 oz  Height '5\' 1"'$  (1.549 m) '5\' 1"'$  (1.549 m) '5\' 2"'$  (1.575 m)  BMI 30.25 kg/m2 32.31 kg/m2 32.52 kg/m2

## 2022-01-09 NOTE — Assessment & Plan Note (Signed)
Controlled, no change in medication  

## 2022-01-09 NOTE — Assessment & Plan Note (Signed)
Needs new Provider as current office isclosing, most immediate pain needs will be handled by Ortho ppost op, but needs long term pain clinic

## 2022-01-09 NOTE — Progress Notes (Signed)
   Zoe Bailey     MRN: [id number]      DOB: May 03, 1965   HPI Zoe Bailey is here for follow up and re-evaluation of chronic medical conditions, medication management and review of any available recent lab and radiology data.  Preventive health is updated, specifically  Cancer screening and Immunization.   Has left foot surgery next week which will take her out of work for approx 6 months and also has other joint surgery upcoming. She is depressed and devastated today as she has received her 3rd write up for poor job performance today When asked, state she has no ne to help her at home post surgery, overall bleak outlook currnetly bu denies suicidal o homicidal ideation  Needs to find new pain clnic as current is closing  ROS Denies recent fever or chills. Denies sinus pressure, nasal congestion, ear pain or sore throat. Denies chest congestion, productive cough or wheezing. Denies chest pains, palpitations and leg swelling Denies abdominal pain, nausea, vomiting,diarrhea or constipation.   Denies dysuria, frequency, hesitancy or incontinence. C/o  joint pain, swelling and limitation in mobility. Denies headaches, seizures, numbness, or tingling. C/o  depression, anxiety or insomnia. Denies skin break down or rash.   PE  BP 119/81   Pulse (!) 103   Resp 16   Ht '5\' 1"'$  (1.549 m)   Wt 160 lb 1.9 oz (72.6 kg)   LMP 04/09/2017 (Approximate)   SpO2 94%   BMI 30.25 kg/m   Patient alert and oriented and in no cardiopulmonary distress.  HEENT: No facial asymmetry, EOMI,     Neck supple .  Chest: Clear to auscultation bilaterally.  CVS: S1, S2 no murmurs, no S3.Regular rate.  ABD: Soft non tender.   Ext: No edema  MS: Adequate thogh reduced  ROM spine, decreased in ankles and wrists ,  Skin: Intact, no ulcerations or rash noted.  Psych: Good eye contact, tearful  affect. Memory intact  anxious and  depressed appearing.  CNS: CN 2-12 intact, power,  normal throughout.no focal  deficits noted.   Assessment & Plan  Essential hypertension Controlled, no change in medication   Depression with anxiety Currently uncontrolled due to poor health, upcoming surgery with no help at home and recent very poor job performance write up Does not want counseling currently, no med change at this time  Obesity (BMI 30.0-34.9) Improved, continue weekly wegovy  Patient re-educated about  the importance of commitment to a  minimum of 150 minutes of exercise per week as able.  The importance of healthy food choices with portion control discussed, as well as eating regularly and within a 12 hour window most days. The need to choose "clean , green" food 50 to 75% of the time is discussed, as well as to make water the primary drink and set a goal of 64 ounces water daily.       01/09/2022    4:03 PM 10/28/2021    9:25 AM 09/25/2021    2:14 PM  Weight /BMI  Weight 160 lb 1.9 oz 171 lb 177 lb 12.8 oz  Height '5\' 1"'$  (1.549 m) '5\' 1"'$  (1.549 m) '5\' 2"'$  (1.575 m)  BMI 30.25 kg/m2 32.31 kg/m2 32.52 kg/m2      Migraine Controlled, no change in medication   Chronic pain Needs new Provider as current office isclosing, most immediate pain needs will be handled by Ortho ppost op, but needs long term pain clinic

## 2022-01-12 ENCOUNTER — Other Ambulatory Visit (HOSPITAL_COMMUNITY): Payer: Self-pay

## 2022-01-12 ENCOUNTER — Encounter: Payer: Self-pay | Admitting: Family Medicine

## 2022-01-12 MED ORDER — SEMAGLUTIDE-WEIGHT MANAGEMENT 1.7 MG/0.75ML ~~LOC~~ SOAJ
1.7000 mg | SUBCUTANEOUS | 1 refills | Status: DC
  Filled 2022-01-12: qty 3, 28d supply, fill #0
  Filled 2022-02-12: qty 3, 28d supply, fill #1

## 2022-01-13 ENCOUNTER — Other Ambulatory Visit (HOSPITAL_COMMUNITY): Payer: Self-pay

## 2022-01-15 ENCOUNTER — Other Ambulatory Visit: Payer: Self-pay

## 2022-01-15 ENCOUNTER — Encounter (HOSPITAL_COMMUNITY): Payer: Self-pay | Admitting: Orthopedic Surgery

## 2022-01-15 NOTE — Progress Notes (Signed)
Zoe Bailey denies chest pain or shortness of breath. Patient denies having any s/s of Covid in her household, also denies any known exposure to Covid.   Zoe Bailey's PCP is Dr. Tula Nakayama. Sr. Moshe Cipro has Zoe Bailey on [place] for weight loss.  Patient has lost 24 lbs and blood pressure medications are on hold.

## 2022-01-15 NOTE — Anesthesia Preprocedure Evaluation (Addendum)
Anesthesia Evaluation  Patient identified by MRN, date of birth, ID band Patient awake    Reviewed: Allergy & Precautions, NPO status , Patient's Chart, lab work & pertinent test results  History of Anesthesia Complications (+) history of anesthetic complications (reports waking up during surgeries)  Airway Mallampati: II  TM Distance: >3 FB Neck ROM: Full    Dental   Pulmonary neg shortness of breath, neg sleep apnea, COPD,  COPD inhaler, neg recent URI, Current Smoker and Patient abstained from smoking.   Pulmonary exam normal breath sounds clear to auscultation       Cardiovascular hypertension (amlodipine), Pt. on medications (-) angina (-) Past MI, (-) Cardiac Stents and (-) CABG  Rhythm:Regular Rate:Normal     Neuro/Psych  Headaches PSYCHIATRIC DISORDERS Anxiety Depression       GI/Hepatic Neg liver ROS,GERD  ,,  Endo/Other  negative endocrine ROS    Renal/GU negative Renal ROS     Musculoskeletal  (+) Arthritis ,    Abdominal  (+) - obese  Peds  Hematology prediabetes   Anesthesia Other Findings On semiglutide  Reproductive/Obstetrics                             Anesthesia Physical Anesthesia Plan  ASA: 3  Anesthesia Plan: Regional and General   Post-op Pain Management:    Induction: Intravenous  PONV Risk Score and Plan: 3 and Ondansetron, Dexamethasone and Treatment may vary due to age or medical condition  Airway Management Planned: LMA  Additional Equipment:   Intra-op Plan:   Post-operative Plan: Extubation in OR  Informed Consent: I have reviewed the patients History and Physical, chart, labs and discussed the procedure including the risks, benefits and alternatives for the proposed anesthesia with the patient or authorized representative who has indicated his/her understanding and acceptance.     Dental advisory given  Plan Discussed with: CRNA and  Anesthesiologist  Anesthesia Plan Comments: (Discussed potential risks of nerve blocks including, but not limited to, infection, bleeding, nerve damage, seizures, pneumothorax, respiratory depression, and potential failure of the block. Alternatives to nerve blocks discussed. All questions answered.  Risks of general anesthesia discussed including, but not limited to, sore throat, hoarse voice, chipped/damaged teeth, injury to vocal cords, nausea and vomiting, allergic reactions, lung infection, heart attack, stroke, and death. All questions answered. )       Anesthesia Quick Evaluation

## 2022-01-16 ENCOUNTER — Other Ambulatory Visit: Payer: Self-pay

## 2022-01-16 ENCOUNTER — Encounter (HOSPITAL_COMMUNITY)
Admission: RE | Disposition: A | Discharge status: Home / Self Care | Payer: Self-pay | Source: Home / Self Care | Attending: Orthopedic Surgery

## 2022-01-16 ENCOUNTER — Ambulatory Visit (HOSPITAL_COMMUNITY): Payer: 59 | Admitting: Anesthesiology

## 2022-01-16 ENCOUNTER — Other Ambulatory Visit (HOSPITAL_COMMUNITY): Payer: Self-pay

## 2022-01-16 ENCOUNTER — Ambulatory Visit (HOSPITAL_COMMUNITY)
Admission: RE | Admit: 2022-01-16 | Discharge: 2022-01-16 | Disposition: A | Discharge status: Home / Self Care | Payer: 59 | Attending: Orthopedic Surgery | Admitting: Orthopedic Surgery

## 2022-01-16 ENCOUNTER — Encounter (HOSPITAL_COMMUNITY): Payer: Self-pay | Admitting: Orthopedic Surgery

## 2022-01-16 ENCOUNTER — Ambulatory Visit (HOSPITAL_BASED_OUTPATIENT_CLINIC_OR_DEPARTMENT_OTHER): Payer: 59 | Admitting: Anesthesiology

## 2022-01-16 ENCOUNTER — Ambulatory Visit (HOSPITAL_COMMUNITY): Payer: 59

## 2022-01-16 DIAGNOSIS — J449 Chronic obstructive pulmonary disease, unspecified: Secondary | ICD-10-CM

## 2022-01-16 DIAGNOSIS — K219 Gastro-esophageal reflux disease without esophagitis: Secondary | ICD-10-CM | POA: Diagnosis not present

## 2022-01-16 DIAGNOSIS — M67972 Unspecified disorder of synovium and tendon, left ankle and foot: Secondary | ICD-10-CM | POA: Diagnosis not present

## 2022-01-16 DIAGNOSIS — F17200 Nicotine dependence, unspecified, uncomplicated: Secondary | ICD-10-CM | POA: Diagnosis not present

## 2022-01-16 DIAGNOSIS — F17210 Nicotine dependence, cigarettes, uncomplicated: Secondary | ICD-10-CM | POA: Diagnosis not present

## 2022-01-16 DIAGNOSIS — I10 Essential (primary) hypertension: Secondary | ICD-10-CM | POA: Diagnosis not present

## 2022-01-16 DIAGNOSIS — M76822 Posterior tibial tendinitis, left leg: Secondary | ICD-10-CM | POA: Diagnosis not present

## 2022-01-16 DIAGNOSIS — M67874 Other specified disorders of tendon, left ankle and foot: Secondary | ICD-10-CM | POA: Insufficient documentation

## 2022-01-16 DIAGNOSIS — F418 Other specified anxiety disorders: Secondary | ICD-10-CM | POA: Diagnosis not present

## 2022-01-16 DIAGNOSIS — G8929 Other chronic pain: Secondary | ICD-10-CM

## 2022-01-16 HISTORY — DX: Essential (primary) hypertension: I10

## 2022-01-16 HISTORY — PX: FOOT ARTHRODESIS: SHX1655

## 2022-01-16 HISTORY — DX: Anxiety disorder, unspecified: F41.9

## 2022-01-16 LAB — BASIC METABOLIC PANEL
Anion gap: 9 (ref 5–15)
BUN: 17 mg/dL (ref 6–20)
CO2: 25 mmol/L (ref 22–32)
Calcium: 9.1 mg/dL (ref 8.9–10.3)
Chloride: 104 mmol/L (ref 98–111)
Creatinine, Ser: 0.74 mg/dL (ref 0.44–1.00)
GFR, Estimated: >60 mL/min (ref >60)
Glucose, Bld: 94 mg/dL (ref 70–99)
Potassium: 3.8 mmol/L (ref 3.5–5.1)
Sodium: 138 mmol/L (ref 135–145)

## 2022-01-16 LAB — CBC
HCT: 43.4 % (ref 36.0–46.0)
Hemoglobin: 14 g/dL (ref 12.0–15.0)
MCH: 30.5 pg (ref 26.0–34.0)
MCHC: 32.3 g/dL (ref 30.0–36.0)
MCV: 94.6 fL (ref 80.0–100.0)
Platelets: 263 10*3/uL (ref 150–400)
RBC: 4.59 MIL/uL (ref 3.87–5.11)
RDW: 14.7 % (ref 11.5–15.5)
WBC: 12.5 10*3/uL — ABNORMAL HIGH (ref 4.0–10.5)
nRBC: 0 % (ref 0.0–0.2)

## 2022-01-16 SURGERY — FUSION, JOINT, FOOT
Anesthesia: Monitor Anesthesia Care | Site: Foot | Laterality: Left

## 2022-01-16 MED ORDER — OXYCODONE HCL 5 MG/5ML PO SOLN: 5.0000 mg | Freq: Once | ORAL | Status: DC | PRN

## 2022-01-16 MED ORDER — ONDANSETRON HCL 4 MG/2ML IJ SOLN
INTRAMUSCULAR | Status: DC | PRN
  Administered 2022-01-16: 4 mg via INTRAVENOUS

## 2022-01-16 MED ORDER — MIDAZOLAM HCL 2 MG/2ML IJ SOLN
INTRAMUSCULAR | Status: AC
  Filled 2022-01-16: qty 2

## 2022-01-16 MED ORDER — ORAL CARE MOUTH RINSE: 15.0000 mL | Freq: Once | OROMUCOSAL | Status: AC

## 2022-01-16 MED ORDER — OXYCODONE HCL 5 MG PO TABS: 5.0000 mg | ORAL_TABLET | Freq: Once | ORAL | Status: DC | PRN

## 2022-01-16 MED ORDER — CEFAZOLIN SODIUM-DEXTROSE 2-3 GM-%(50ML) IV SOLR
INTRAVENOUS | Status: DC | PRN
  Administered 2022-01-16: 2 g via INTRAVENOUS

## 2022-01-16 MED ORDER — FENTANYL CITRATE (PF) 250 MCG/5ML IJ SOLN
INTRAMUSCULAR | Status: DC | PRN
  Administered 2022-01-16: 50 ug via INTRAVENOUS

## 2022-01-16 MED ORDER — DEXAMETHASONE SODIUM PHOSPHATE 10 MG/ML IJ SOLN
INTRAMUSCULAR | Status: DC | PRN
  Administered 2022-01-16: 4 mg via INTRAVENOUS

## 2022-01-16 MED ORDER — STERILE WATER FOR IRRIGATION IR SOLN
Status: DC | PRN
  Administered 2022-01-16: 1000 mL

## 2022-01-16 MED ORDER — PROPOFOL 500 MG/50ML IV EMUL
INTRAVENOUS | Status: DC | PRN
  Administered 2022-01-16: 200 ug/kg/min via INTRAVENOUS

## 2022-01-16 MED ORDER — PHENYLEPHRINE HCL (PRESSORS) 10 MG/ML IV SOLN
INTRAVENOUS | Status: DC | PRN
  Administered 2022-01-16: 80 ug via INTRAVENOUS

## 2022-01-16 MED ORDER — PROMETHAZINE HCL 25 MG/ML IJ SOLN: 6.2500 mg | INTRAMUSCULAR | Status: DC | PRN

## 2022-01-16 MED ORDER — LACTATED RINGERS IV SOLN: INTRAVENOUS | Status: DC

## 2022-01-16 MED ORDER — FENTANYL CITRATE (PF) 100 MCG/2ML IJ SOLN: 25.0000 ug | INTRAMUSCULAR | Status: DC | PRN

## 2022-01-16 MED ORDER — ROPIVACAINE HCL 5 MG/ML IJ SOLN
INTRAMUSCULAR | Status: DC | PRN
  Administered 2022-01-16: 15 mL via PERINEURAL
  Administered 2022-01-16: 30 mL via PERINEURAL

## 2022-01-16 MED ORDER — PROPOFOL 1000 MG/100ML IV EMUL
INTRAVENOUS | Status: AC
  Filled 2022-01-16: qty 500

## 2022-01-16 MED ORDER — CEFAZOLIN SODIUM-DEXTROSE 2-4 GM/100ML-% IV SOLN
INTRAVENOUS | Status: AC
  Filled 2022-01-16: qty 100

## 2022-01-16 MED ORDER — CHLORHEXIDINE GLUCONATE 0.12 % MT SOLN
15.0000 mL | Freq: Once | OROMUCOSAL | Status: AC
  Administered 2022-01-16: 15 mL via OROMUCOSAL
  Filled 2022-01-16: qty 15

## 2022-01-16 MED ORDER — ROPIVACAINE HCL 5 MG/ML IJ SOLN: INTRAMUSCULAR | Status: DC | PRN

## 2022-01-16 MED ORDER — MIDAZOLAM HCL 5 MG/5ML IJ SOLN
INTRAMUSCULAR | Status: DC | PRN
  Administered 2022-01-16 (×2): 2 mg via INTRAVENOUS

## 2022-01-16 MED ORDER — FENTANYL CITRATE (PF) 250 MCG/5ML IJ SOLN
INTRAMUSCULAR | Status: AC
  Filled 2022-01-16: qty 5

## 2022-01-16 MED ORDER — HYDROCODONE-ACETAMINOPHEN 5-325 MG PO TABS
1.0000 | ORAL_TABLET | ORAL | 0 refills | Status: DC | PRN
  Filled 2022-01-16: qty 30, 5d supply, fill #0

## 2022-01-16 SURGICAL SUPPLY — 47 items
BAG COUNTER SPONGE SURGICOUNT (BAG) ×1 IMPLANT
BANDAGE ESMARK 6X9 LF (GAUZE/BANDAGES/DRESSINGS) IMPLANT
BLADE AVERAGE 25X9 (BLADE) IMPLANT
BLADE SAW SGTL HD 18.5X60.5X1. (BLADE) ×1 IMPLANT
BLADE SURG 10 STRL SS (BLADE) IMPLANT
BNDG COHESIVE 4X5 TAN STRL (GAUZE/BANDAGES/DRESSINGS) ×1 IMPLANT
BNDG COHESIVE 6X5 TAN NS LF (GAUZE/BANDAGES/DRESSINGS) IMPLANT
BNDG ESMARK 6X9 LF (GAUZE/BANDAGES/DRESSINGS) ×1
BNDG GAUZE DERMACEA FLUFF 4 (GAUZE/BANDAGES/DRESSINGS) ×2 IMPLANT
BUR EGG ELITE 4.0 (BURR) IMPLANT
COTTON STERILE ROLL (GAUZE/BANDAGES/DRESSINGS) ×1 IMPLANT
COVER MAYO STAND STRL (DRAPES) IMPLANT
COVER SURGICAL LIGHT HANDLE (MISCELLANEOUS) ×2 IMPLANT
DRAPE INCISE IOBAN 66X45 STRL (DRAPES) ×1 IMPLANT
DRAPE OEC MINIVIEW 54X84 (DRAPES) IMPLANT
DRAPE U-SHAPE 47X51 STRL (DRAPES) ×1 IMPLANT
DRSG ADAPTIC 3X8 NADH LF (GAUZE/BANDAGES/DRESSINGS) ×1 IMPLANT
DURAPREP 26ML APPLICATOR (WOUND CARE) ×1 IMPLANT
ELECT REM PT RETURN 9FT ADLT (ELECTROSURGICAL) ×1
ELECTRODE REM PT RTRN 9FT ADLT (ELECTROSURGICAL) ×1 IMPLANT
GAUZE PAD ABD 8X10 STRL (GAUZE/BANDAGES/DRESSINGS) IMPLANT
GAUZE SPONGE 4X4 12PLY STRL (GAUZE/BANDAGES/DRESSINGS) ×1 IMPLANT
GLOVE BIOGEL PI IND STRL 9 (GLOVE) ×1 IMPLANT
GLOVE SURG ORTHO 9.0 STRL STRW (GLOVE) ×1 IMPLANT
GOWN STRL REUS W/ TWL XL LVL3 (GOWN DISPOSABLE) ×3 IMPLANT
GOWN STRL REUS W/TWL XL LVL3 (GOWN DISPOSABLE) ×1
GUIDEWIRE TYB 2X9 (WIRE) IMPLANT
KIT BASIN OR (CUSTOM PROCEDURE TRAY) ×1 IMPLANT
KIT STAPLE ARCUS 20X17 STRL (Staple) IMPLANT
KIT TURNOVER KIT B (KITS) ×1 IMPLANT
MANIFOLD NEPTUNE II (INSTRUMENTS) ×1 IMPLANT
NS IRRIG 1000ML POUR BTL (IV SOLUTION) ×1 IMPLANT
PACK ORTHO EXTREMITY (CUSTOM PROCEDURE TRAY) ×1 IMPLANT
PAD ARMBOARD 7.5X6 YLW CONV (MISCELLANEOUS) ×2 IMPLANT
PAD CAST 4YDX4 CTTN HI CHSV (CAST SUPPLIES) ×1 IMPLANT
PADDING CAST COTTON 4X4 STRL (CAST SUPPLIES)
PUTTY DBM STAGRAFT PLUS 5CC (Putty) IMPLANT
SCREW CANN HDLS SHT 6.5X70 (Screw) IMPLANT
SPONGE T-LAP 18X18 ~~LOC~~+RFID (SPONGE) ×1 IMPLANT
SUCTION FRAZIER HANDLE 10FR (MISCELLANEOUS) ×1
SUCTION TUBE FRAZIER 10FR DISP (MISCELLANEOUS) ×1 IMPLANT
SUT ETHILON 2 0 PSLX (SUTURE) ×3 IMPLANT
TOWEL GREEN STERILE (TOWEL DISPOSABLE) ×1 IMPLANT
TOWEL GREEN STERILE FF (TOWEL DISPOSABLE) ×1 IMPLANT
TUBE CONNECTING 12X1/4 (SUCTIONS) ×1 IMPLANT
WATER STERILE IRR 1000ML POUR (IV SOLUTION) ×1 IMPLANT
YANKAUER SUCT BULB TIP NO VENT (SUCTIONS) IMPLANT

## 2022-01-16 NOTE — Progress Notes (Signed)
Orthopedic Tech Progress Note Patient Details:  Zoe Bailey 1965/12/01 [id number]  Ortho Devices Type of Ortho Device: CAM walker Ortho Device/Splint Location: LLE Ortho Device/Splint Interventions: Ordered, Application, Adjustment   Post Interventions Patient Tolerated: Well Instructions Provided: Adjustment of device, Care of device  [name] 01/16/2022, 10:27 AM

## 2022-01-16 NOTE — H&P (Signed)
Zoe Bailey is an 56 y.o. female.   Chief Complaint: Pain and deformity left hindfoot HPI: Patient is a 56 year old woman who presents with progressive pain and deformity left foot.  Patient complains of pain over the lateral aspect of her foot as well as medially with progressive pronation and valgus of her foot.  She states she has pain with ambulation the brace no longer is helpful.   Past Medical History:  Diagnosis Date   Anxiety    Arthritis    Cancer ([place]) 2010 approx   skin cancer   Complication of anesthesia    Wakes up during all surgeries   COPD (chronic obstructive pulmonary disease) ([place])    Depression    Dysphagia 12/22/2018   Foot pain, right 03/2014   Hemorrhoid 08/11/2017   Hypertension    Menometrorrhagia 07/14/2013   Migraine    USES BOTOX FOR TREATMENT    Oral phase dysphagia 01/24/2019   Added automatically from request for surgery [id number]   Polymenorrhea 07/11/2013   Started two cycles per month since 03/2013   Shortness of breath dyspnea    Toenail fungus     Past Surgical History:  Procedure Laterality Date   ANKLE FRACTURE SURGERY Right    BREAST ENHANCEMENT SURGERY     CARPAL TUNNEL RELEASE Right    x2   CHONDROPLASTY Right 02/17/2018   Procedure: CHONDROPLASTY;  Surgeon: Paralee Cancel, MD;  Location: [facility];  Service: Orthopedics;  Laterality: Right;   COLONOSCOPY WITH PROPOFOL N/A 05/07/2017   Procedure: COLONOSCOPY WITH PROPOFOL;  Surgeon: Rogene Houston, MD;  Location: [facility];  Service: Endoscopy;  Laterality: N/A;  8:30   ESOPHAGOGASTRODUODENOSCOPY (EGD) WITH PROPOFOL N/A 03/06/2019   Procedure: ESOPHAGOGASTRODUODENOSCOPY (EGD) WITH PROPOFOL;  Surgeon: Rogene Houston, MD;  [facility]: [facility];  Service: Endoscopy;  Laterality: N/A;   HERNIA REPAIR     umbilical   KNEE ARTHROSCOPY WITH MEDIAL MENISECTOMY Right 02/17/2018   Procedure: KNEE ARTHROSCOPY WITH MEDIAL MENISECTOMY;  Surgeon: Paralee Cancel, MD;  Location: [facility];   Service: Orthopedics;  Laterality: Right;   MALONEY DILATION  03/06/2019   Procedure: MALONEY DILATION;  Surgeon: Rogene Houston, MD;  [facility];  Service: Endoscopy;;   NASAL POLYP SURGERY     POLYPECTOMY     WRIST FUSION Right     Family History  Problem Relation Age of Onset   Arthritis Other    Cancer Other    Social History:  reports that she has been smoking cigarettes. She has a 12.54 pack-year smoking history. She has never used smokeless tobacco. She reports that she does not drink alcohol and does not use drugs.  Allergies:  Allergies  Allergen Reactions   Effexor [Venlafaxine] Other (See Comments)    Decreased libido   Other Other (See Comments)    Pt has a fear of tourniquets    Medications Prior to Admission  Medication Sig Dispense Refill   amLODipine (NORVASC) 10 MG tablet Take 1 tablet (10 mg total) by mouth daily. 60 tablet 0   botulinum toxin Type A (BOTOX) 100 units SOLR injection 200 units to be injected intramuscularly in the head, neck and trunk areas every 12 weeks (84 days). 2 each 3   cyclobenzaprine (FLEXERIL) 10 MG tablet Take 1 tablet (10 mg total) by mouth 3 (three) times daily. 90 tablet 5   diclofenac (VOLTAREN) 75 MG EC tablet Take 1 tablet (75 mg total) by mouth daily as needed for  pain (Patient taking differently: Take 75 mg by mouth daily.) 30 tablet 2   DULoxetine (CYMBALTA) 60 MG capsule Take 1 capsule (60 mg total) by mouth 2 (two) times daily. 180 capsule 4   ergocalciferol (VITAMIN D2) 1.25 MG (50000 UT) capsule Take 1 capsule (50,000 Units total) by mouth once a week. 12 capsule 2   esomeprazole (NEXIUM) 40 MG capsule Take 1 capsule (40 mg total) by mouth daily before breakfast. 90 capsule 3   gabapentin (NEURONTIN) 400 MG capsule Take 1 capsule (400 mg total) by mouth 4 (four) times daily as needed. (Patient taking differently: Take 400 mg by mouth 4 (four) times daily.) 120 capsule 2   HYDROcodone-acetaminophen (NORCO)  10-325 MG tablet Take 1 tablet by mouth 3 (three) times daily as needed. (01-26-22) (Patient taking differently: Take 1 tablet by mouth 3 (three) times daily.) 90 tablet 0   hydrOXYzine (ATARAX) 25 MG tablet Take 1 tablet (25 mg total) by mouth at bedtime. 90 tablet 4   Semaglutide-Weight Management 1.7 MG/0.75ML SOAJ Inject 1.7 mg into the skin once a week. 3 mL 1   triamterene-hydrochlorothiazide (MAXZIDE) 75-50 MG tablet TAKE 1 TABLET BY MOUTH DAILY. 90 tablet 3   busPIRone (BUSPAR) 10 MG tablet Take 1 tablet (10 mg total) by mouth 3 (three) times daily. 90 tablet 3   Fluticasone Propionate (XHANCE) 93 MCG/ACT EXHU Place 1 spray into the nose 2 (two) times daily. (Patient not taking: Reported on 01/15/2022) 32 mL 2   tiotropium (SPIRIVA HANDIHALER) 18 MCG inhalation capsule Place 1 capsule (18 mcg total) into inhaler and inhale daily. (Patient taking differently: Place 18 mcg into inhaler and inhale daily as needed (COPD).) 30 capsule 3   UNABLE TO FIND Blood pressure monitor x 1  DX I10 1 each 0    Results for orders placed or performed during the hospital encounter of 01/16/22 (from the past 48 hour(s))  CBC per protocol     Status: Abnormal   Collection Time: 01/16/22  5:52 AM  Result Value Ref Range   WBC 12.5 (H) 4.0 - 10.5 K/uL   RBC 4.59 3.87 - 5.11 MIL/uL   Hemoglobin 14.0 12.0 - 15.0 g/dL   HCT 43.4 36.0 - 46.0 %   MCV 94.6 80.0 - 100.0 fL   MCH 30.5 26.0 - 34.0 pg   MCHC 32.3 30.0 - 36.0 g/dL   RDW 14.7 11.5 - 15.5 %   Platelets 263 150 - 400 K/uL   nRBC 0.0 0.0 - 0.2 %    Comment: Performed at [facility], [place] [address] [place], [place] [address]  Basic metabolic panel per protocol     Status: None   Collection Time: 01/16/22  5:52 AM  Result Value Ref Range   Sodium 138 135 - 145 mmol/L   Potassium 3.8 3.5 - 5.1 mmol/L   Chloride 104 98 - 111 mmol/L   CO2 25 22 - 32 mmol/L   Glucose, Bld 94 70 - 99 mg/dL    Comment: Glucose reference range applies only to  samples taken after fasting for at least 8 hours.   BUN 17 6 - 20 mg/dL   Creatinine, Ser 0.74 0.44 - 1.00 mg/dL   Calcium 9.1 8.9 - 10.3 mg/dL   GFR, Estimated >60 >60 mL/min    Comment: (NOTE) Calculated using the CKD-EPI Creatinine Equation (2021)    Anion gap 9 5 - 15    Comment: Performed at [place]  [address], [place], [place] [address]   No results found.  Review of Systems  All other systems reviewed and are negative.   Blood pressure 114/65, pulse 73, temperature 98 F (36.7 C), temperature source Oral, resp. rate 16, height '5\' 1"'$  (1.549 m), weight 71.7 kg, last menstrual period 04/09/2017, SpO2 96 %. Physical Exam  Patient is alert, oriented, no adenopathy, well-dressed, normal affect, normal respiratory effort. Examination patient has a palpable pulse she has a pronated valgus hindfoot she cannot do a single limb heel raise she does not have tenderness to palpation over the peroneal tendons or posterior tibial tendon she has swelling over the sinus Tarsi and pain to palpation over the sinus Tarsi.  She also has pain to palpation over the talonavicular joint. Assessment/Plan 1. Pain in left ankle and joints of left foot   2. Posterior tibial tendon dysfunction (PTTD) of left lower extremity       Plan: With the patient's progressive deformity with posterior tibial tendon insufficiency recommended proceeding with a subtalar and talonavicular fusion.  Risks and benefits were discussed including the loss of the subtalar motion prolonged rehab as well as the potential to still have symptoms.   Newt Minion, MD 01/16/2022, 6:49 AM

## 2022-01-16 NOTE — Anesthesia Procedure Notes (Signed)
Anesthesia Regional Block: Popliteal block   Pre-Anesthetic Checklist: , timeout performed,  Correct Patient, Correct Site, Correct Laterality,  Correct Procedure, Correct Position, site marked,  Risks and benefits discussed,  Surgical consent,  Pre-op evaluation,  At surgeon's request and post-op pain management  Laterality: Left  Prep: chloraprep       Needles:  Injection technique: Single-shot  Needle Type: Echogenic Stimulator Needle     Needle Length: 9cm  Needle Gauge: 21     Additional Needles:   Procedures:,,,, ultrasound used (permanent image in chart),,    Narrative:  Start time: 01/16/2022 7:22 AM End time: 01/16/2022 7:27 AM Injection made incrementally with aspirations every 5 mL.  Performed by: Personally  Anesthesiologist: Nilda Simmer, MD  Additional Notes: Discussed risks and benefits of nerve block including, but not limited to, prolonged and/or permanent nerve injury involving sensory and/or motor function. Monitors were applied and a time-out was performed. The nerve and associated structures were visualized under ultrasound guidance. After negative aspiration, local anesthetic was slowly injected around the nerve. There was no evidence of high pressure during the procedure. There were no paresthesias. VSS remained stable and the patient tolerated the procedure well.

## 2022-01-16 NOTE — Anesthesia Postprocedure Evaluation (Signed)
Anesthesia Post Note  Patient: Zoe Bailey  Procedure(s) Performed: FUSION OF TALONAVICULAR AND SUBTALAR JOINT LEFT FOOT (Left: Foot)     Patient location during evaluation: PACU Anesthesia Type: Regional and MAC Level of consciousness: awake Pain management: pain level controlled Vital Signs Assessment: post-procedure vital signs reviewed and stable Respiratory status: spontaneous breathing, nonlabored ventilation and respiratory function stable Cardiovascular status: stable and blood pressure returned to baseline Postop Assessment: no apparent nausea or vomiting Anesthetic complications: no   No notable events documented.  Last Vitals:  Vitals:   01/16/22 0945 01/16/22 0950  BP: (!) 158/77   Pulse: 66 71  Resp: 14 18  Temp:  (!) 36.4 C  SpO2: 95% 96%    Last Pain:  Vitals:   01/16/22 0945  TempSrc:   PainSc: 0-No pain                 Nilda Simmer

## 2022-01-16 NOTE — Transfer of Care (Signed)
Immediate Anesthesia Transfer of Care Note  Patient: Zoe Bailey  Procedure(s) Performed: FUSION OF TALONAVICULAR AND SUBTALAR JOINT LEFT FOOT (Left: Foot)  Patient Location: PACU  Anesthesia Type:MAC and Regional  Level of Consciousness: sedated  Airway & Oxygen Therapy: Patient Spontanous Breathing and Patient connected to face mask oxygen  Post-op Assessment: Report given to RN, Post -op Vital signs reviewed and stable, and Patient moving all extremities  Post vital signs: Reviewed and stable  Last Vitals:  Vitals Value Taken Time  BP 87/64 01/16/22 0837  Temp    Pulse 74 01/16/22 0840  Resp 14 01/16/22 0840  SpO2 99 % 01/16/22 0840  Vitals shown include unvalidated device data.  Last Pain:  Vitals:   01/16/22 0611  TempSrc: Oral  PainSc: 5       Patients Stated Pain Goal: 0 (70/48/88 9169)  Complications: No notable events documented.

## 2022-01-16 NOTE — Op Note (Signed)
01/16/2022  8:29 AM  PATIENT:  Zoe Bailey    PRE-OPERATIVE DIAGNOSIS:  Left Posterior Tibial Tendon Insufficiency  POST-OPERATIVE DIAGNOSIS:  Same  PROCEDURE:  FUSION OF TALONAVICULAR AND SUBTALAR JOINT LEFT FOOT  SURGEON:  Newt Minion, MD  PHYSICIAN ASSISTANT:None ANESTHESIA:   General  PREOPERATIVE INDICATIONS:  Zoe Bailey is a  56 y.o. female with a diagnosis of Left Posterior Tibial Tendon Insufficiency who failed conservative measures and elected for surgical management.    The risks benefits and alternatives were discussed with the patient preoperatively including but not limited to the risks of infection, bleeding, nerve injury, cardiopulmonary complications, the need for revision surgery, among others, and the patient was willing to proceed.  OPERATIVE IMPLANTS: Staple and headless cannulated screw  '@ENCIMAGES'$ @  OPERATIVE FINDINGS: See arthroscopy verified reduction of the talonavicular and subtalar joint.  OPERATIVE PROCEDURE: Patient was brought the operating room after undergoing a regional anesthetic.  After adequate levels anesthesia obtained patient's left lower extremity was prepped using DuraPrep draped into a sterile field a timeout was called.  A oblique incision was made over the sinus Tarsi this was carried down to the subtalar joint.  Retractors were placed to protect the superficial nerves and the peroneal tendons.  A rim the bur was used to debride the articular cartilage this was finally debrided with a curette.  Visualization showed cancellous bone subtalar joint.  This was irrigated and attention was then focused to the talonavicular joint.  A dorsal incision was made over the talonavicular joint and the incision was carried down through the anterior tibial tendon interval and the EHL tendon interval.  This was carried down to the talonavicular joint and a bur and curette were used to debride the articular cartilage from the talonavicular joint.  This was  irrigated.  The subtalar and talonavicular joint were then packed with a total of 5 cc demineralized bone matrix.  A staple was then used 20 mm to secure the talonavicular joint and a screw from the calcaneus to the talus was used to secure the subtalar joint.  See arthroscopy verified reduction.  The incisions were closed using 2-0 nylon a sterile dressing was applied patient was taken the PACU in stable condition.   DISCHARGE PLANNING:  Antibiotic duration: Preoperative antibiotics  Weightbearing: Nonweightbearing on the left  Pain medication: Prescription for Vicodin  Dressing care/ Wound VAC: Dry dressing  Ambulatory devices: Walker  Discharge to: Home.  Follow-up: In the office 1 week post operative.

## 2022-01-16 NOTE — Anesthesia Procedure Notes (Signed)
Anesthesia Regional Block: Adductor canal block   Pre-Anesthetic Checklist: , timeout performed,  Correct Patient, Correct Site, Correct Laterality,  Correct Procedure, Correct Position, site marked,  Risks and benefits discussed,  Surgical consent,  Pre-op evaluation,  At surgeon's request and post-op pain management  Laterality: Left  Prep: chloraprep       Needles:  Injection technique: Single-shot  Needle Type: Echogenic Stimulator Needle     Needle Length: 9cm  Needle Gauge: 21     Additional Needles:   Procedures:,,,, ultrasound used (permanent image in chart),,    Narrative:  Start time: 01/16/2022 7:27 AM End time: 01/16/2022 7:30 AM Injection made incrementally with aspirations every 5 mL.  Performed by: Personally  Anesthesiologist: Nilda Simmer, MD  Additional Notes: Discussed risks and benefits of nerve block including, but not limited to, prolonged and/or permanent nerve injury involving sensory and/or motor function. Monitors were applied and a time-out was performed. The nerve and associated structures were visualized under ultrasound guidance. After negative aspiration, local anesthetic was slowly injected around the nerve. There was no evidence of high pressure during the procedure. There were no paresthesias. VSS remained stable and the patient tolerated the procedure well.

## 2022-01-19 ENCOUNTER — Encounter (HOSPITAL_COMMUNITY): Payer: Self-pay | Admitting: Orthopedic Surgery

## 2022-01-19 ENCOUNTER — Telehealth: Payer: Self-pay | Admitting: Orthopedic Surgery

## 2022-01-19 NOTE — Telephone Encounter (Signed)
[employer] forms received. To [employer]. ?

## 2022-01-19 NOTE — Addendum Note (Signed)
Addendum  created 01/19/22 1532 by Nilda Simmer, MD   Clinical Note Signed, Intraprocedure Blocks edited

## 2022-01-30 ENCOUNTER — Other Ambulatory Visit: Payer: Self-pay | Admitting: Orthopedic Surgery

## 2022-01-30 ENCOUNTER — Other Ambulatory Visit (HOSPITAL_COMMUNITY): Payer: Self-pay

## 2022-02-02 ENCOUNTER — Ambulatory Visit (INDEPENDENT_AMBULATORY_CARE_PROVIDER_SITE_OTHER): Payer: 59

## 2022-02-02 ENCOUNTER — Other Ambulatory Visit (INDEPENDENT_AMBULATORY_CARE_PROVIDER_SITE_OTHER): Payer: Self-pay | Admitting: Gastroenterology

## 2022-02-02 ENCOUNTER — Ambulatory Visit (INDEPENDENT_AMBULATORY_CARE_PROVIDER_SITE_OTHER): Payer: 59 | Admitting: Orthopedic Surgery

## 2022-02-02 ENCOUNTER — Other Ambulatory Visit (HOSPITAL_COMMUNITY): Payer: Self-pay

## 2022-02-02 DIAGNOSIS — M25572 Pain in left ankle and joints of left foot: Secondary | ICD-10-CM

## 2022-02-02 DIAGNOSIS — K219 Gastro-esophageal reflux disease without esophagitis: Secondary | ICD-10-CM

## 2022-02-02 DIAGNOSIS — R1311 Dysphagia, oral phase: Secondary | ICD-10-CM

## 2022-02-02 MED ORDER — HYDROCODONE-ACETAMINOPHEN 5-325 MG PO TABS
1.0000 | ORAL_TABLET | ORAL | 0 refills | Status: DC | PRN
  Filled 2022-02-02: qty 30, 5d supply, fill #0

## 2022-02-03 ENCOUNTER — Encounter: Payer: Self-pay | Admitting: Orthopedic Surgery

## 2022-02-03 NOTE — Progress Notes (Signed)
Office Visit Note   Patient: Zoe Bailey           Date of Birth: 06-Jul-1965           MRN: [id number] Visit Date: 02/02/2022              Requested by: Fayrene Helper, MD [address] [place],  [place] [address] PCP: Fayrene Helper, MD  Chief Complaint  Patient presents with   Left Foot - Routine Post Op    01/16/22 fusion of talonavicular and subtalar joint      HPI: Patient is a 56 year old woman who is 2 weeks status post talonavicular and subtalar fusion.  She states that she fell on her foot once.  Otherwise denies any other problems.  Assessment & Plan: Visit Diagnoses:  1. Pain in left ankle and joints of left foot     Plan: Patient has loosened the staple from her fall.  Will have her stay in the fracture boot and not work on range of motion of the ankle yet.  Reevaluate in 2 weeks with repeat three-view radiographs of the left foot at which time we will begin range of motion.  Follow-Up Instructions: No follow-ups on file.   Ortho Exam  Patient is alert, oriented, no adenopathy, well-dressed, normal affect, normal respiratory effort. Examination there is some swelling there is good healing of the incisions and sutures are harvested today.  There is no redness no cellulitis the loose staple is not prominent.  Imaging: XR Foot Complete Left  Result Date: 02/03/2022 Three-view radiographs of the left foot shows good bony apposition across the talonavicular and subtalar joint.  These stable across the talonavicular joint has pulled out and loosened.  XR Ankle Complete Left  Result Date: 02/03/2022 2 view radiographs of the left ankle shows a congruent joint.  Patient has had loosening of the staple across the talonavicular joint.  No images are attached to the encounter.  Labs: Lab Results  Component Value Date   HGBA1C 5.5 09/05/2020   HGBA1C 5.5 10/05/2019   HGBA1C 5.5 10/05/2019   HGBA1C 5.5 (A) 10/05/2019   HGBA1C 5.5 10/05/2019    ESRSEDRATE 14 05/11/2014   CRP <0.5 (L) 05/11/2014   REPTSTATUS 09/20/2018 FINAL 09/19/2018   CULT (A) 09/19/2018    <10,000 COLONIES/mL INSIGNIFICANT GROWTH Performed at [facility], [place] [address], [place],  [address]    LABORGA ESCHERICHIA COLI 12/26/2015     Lab Results  Component Value Date   ALBUMIN 3.8 09/12/2021   ALBUMIN 4.2 09/05/2020   ALBUMIN 4.0 09/16/2018    No results found for: "MG" Lab Results  Component Value Date   VD25OH 21.32 (L) 09/12/2021   VD25OH 25.1 (L) 09/16/2018   VD25OH 29.5 (L) 04/12/2017    No results found for: "PREALBUMIN"    Latest Ref Rng & Units 01/16/2022    5:52 AM 09/12/2021    9:34 AM 09/05/2020   11:19 AM  CBC EXTENDED  WBC 4.0 - 10.5 K/uL 12.5  10.0  9.8   RBC 3.87 - 5.11 MIL/uL 4.59  4.50  4.29   Hemoglobin 12.0 - 15.0 g/dL 14.0  13.9  13.2   HCT 36.0 - 46.0 % 43.4  41.5  41.6   Platelets 150 - 400 K/uL 263  273  320   NEUT# 1.4 - 7.0 x10E3/uL   6.3   Lymph# 0.7 - 3.1 x10E3/uL   2.8  There is no height or weight on file to calculate BMI.  Orders:  Orders Placed This Encounter  Procedures   XR Ankle Complete Left   XR Foot Complete Left   No orders of the defined types were placed in this encounter.    Procedures: No procedures performed  Clinical Data: No additional findings.  ROS:  All other systems negative, except as noted in the HPI. Review of Systems  Objective: Vital Signs: LMP 04/09/2017 (Approximate)   Specialty Comments:  No specialty comments available.  PMFS History: Patient Active Problem List   Diagnosis Date Noted   Posterior tibial tendinitis, left leg 01/16/2022   Chronic pain 01/09/2022   Vitamin D deficiency 10/28/2021   Annual visit for general adult medical examination with abnormal findings 09/16/2021   Back pain 09/16/2021   Allergic rhinitis 09/16/2021   Prediabetes 10/07/2019   Flat feet, bilateral 07/05/2019   Other chronic pain 06/21/2019   Falls  frequently 06/21/2019   Cyst of knee joint 06/21/2019   GERD (gastroesophageal reflux disease) 01/04/2019   Hoarseness, chronic 12/22/2018   Sigmoid diverticulosis 08/11/2017   Essential hypertension 02/28/2015   Joint pain 02/28/2015   Easy bruising 02/19/2014   GAD (generalized anxiety disorder) 01/04/2013   CMC arthritis, thumb, degenerative 04/02/2011   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/02/2011   Obesity (BMI 30.0-34.9) 10/22/2009   Depression with anxiety 10/22/2009   NICOTINE ADDICTION 10/09/2008   Migraine 02/24/2007   Past Medical History:  Diagnosis Date   Anxiety    Arthritis    Cancer ([place]) 2010 approx   skin cancer   Complication of anesthesia    Wakes up during all surgeries   COPD (chronic obstructive pulmonary disease) ([place])    Depression    Dysphagia 12/22/2018   Foot pain, right 03/2014   Hemorrhoid 08/11/2017   Hypertension    Menometrorrhagia 07/14/2013   Migraine    USES BOTOX FOR TREATMENT    Oral phase dysphagia 01/24/2019   Added automatically from request for surgery [id number]   Polymenorrhea 07/11/2013   Started two cycles per month since 03/2013   Shortness of breath dyspnea    Toenail fungus     Family History  Problem Relation Age of Onset   Arthritis Other    Cancer Other     Past Surgical History:  Procedure Laterality Date   ANKLE FRACTURE SURGERY Right    BREAST ENHANCEMENT SURGERY     CARPAL TUNNEL RELEASE Right    x2   CHONDROPLASTY Right 02/17/2018   Procedure: CHONDROPLASTY;  Surgeon: Paralee Cancel, MD;  [facility];  Service: Orthopedics;  Laterality: Right;   COLONOSCOPY WITH PROPOFOL N/A 05/07/2017   Procedure: COLONOSCOPY WITH PROPOFOL;  Surgeon: Rogene Houston, MD;  [facility];  Service: Endoscopy;  Laterality: N/A;  8:30   ESOPHAGOGASTRODUODENOSCOPY (EGD) WITH PROPOFOL N/A 03/06/2019   Procedure: ESOPHAGOGASTRODUODENOSCOPY (EGD) WITH PROPOFOL;  Surgeon: Rogene Houston, MD;  [facility];  Service: Endoscopy;  Laterality: N/A;   FOOT ARTHRODESIS Left 01/16/2022   Procedure: FUSION OF TALONAVICULAR AND SUBTALAR JOINT LEFT FOOT;  Surgeon: Newt Minion, MD;  Location: [facility];  Service: Orthopedics;  Laterality: Left;   HERNIA REPAIR     umbilical   KNEE ARTHROSCOPY WITH MEDIAL MENISECTOMY Right 02/17/2018   Procedure: KNEE ARTHROSCOPY WITH MEDIAL MENISECTOMY;  Surgeon: Paralee Cancel, MD;  [facility];  Service: Orthopedics;  Laterality: Right;   MALONEY DILATION  03/06/2019   Procedure:  MALONEY DILATION;  Surgeon: Rogene Houston, MD;  [facility];  Service: Endoscopy;;   NASAL POLYP SURGERY     POLYPECTOMY     WRIST FUSION Right    Social History   Occupational History   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

## 2022-02-06 ENCOUNTER — Other Ambulatory Visit (HOSPITAL_COMMUNITY): Payer: Self-pay

## 2022-02-10 ENCOUNTER — Other Ambulatory Visit (HOSPITAL_COMMUNITY): Payer: Self-pay

## 2022-02-12 ENCOUNTER — Other Ambulatory Visit (HOSPITAL_COMMUNITY): Payer: Self-pay

## 2022-02-12 DIAGNOSIS — M79672 Pain in left foot: Secondary | ICD-10-CM | POA: Diagnosis not present

## 2022-02-12 DIAGNOSIS — M545 Low back pain, unspecified: Secondary | ICD-10-CM | POA: Diagnosis not present

## 2022-02-12 DIAGNOSIS — Z79891 Long term (current) use of opiate analgesic: Secondary | ICD-10-CM | POA: Diagnosis not present

## 2022-02-12 DIAGNOSIS — G4700 Insomnia, unspecified: Secondary | ICD-10-CM | POA: Diagnosis not present

## 2022-02-12 DIAGNOSIS — G8929 Other chronic pain: Secondary | ICD-10-CM | POA: Diagnosis not present

## 2022-02-12 DIAGNOSIS — M25579 Pain in unspecified ankle and joints of unspecified foot: Secondary | ICD-10-CM | POA: Diagnosis not present

## 2022-02-12 DIAGNOSIS — M25569 Pain in unspecified knee: Secondary | ICD-10-CM | POA: Diagnosis not present

## 2022-02-12 DIAGNOSIS — M25539 Pain in unspecified wrist: Secondary | ICD-10-CM | POA: Diagnosis not present

## 2022-02-12 MED ORDER — HYDROCODONE-ACETAMINOPHEN 10-325 MG PO TABS
1.0000 | ORAL_TABLET | Freq: Three times a day (TID) | ORAL | 0 refills | Status: DC | PRN
  Filled 2022-03-16: qty 90, 30d supply, fill #0

## 2022-02-12 MED ORDER — HYDROCODONE-ACETAMINOPHEN 10-325 MG PO TABS
1.0000 | ORAL_TABLET | Freq: Three times a day (TID) | ORAL | 0 refills | Status: DC | PRN
  Filled 2022-02-12: qty 90, 30d supply, fill #0

## 2022-02-12 MED ORDER — HYDROCODONE-ACETAMINOPHEN 10-325 MG PO TABS
1.0000 | ORAL_TABLET | Freq: Three times a day (TID) | ORAL | 0 refills | Status: DC | PRN
  Filled 2022-04-16: qty 90, 30d supply, fill #0

## 2022-02-16 ENCOUNTER — Encounter: Payer: Self-pay | Admitting: Orthopedic Surgery

## 2022-02-16 ENCOUNTER — Ambulatory Visit (INDEPENDENT_AMBULATORY_CARE_PROVIDER_SITE_OTHER): Payer: 59 | Admitting: Orthopedic Surgery

## 2022-02-16 ENCOUNTER — Ambulatory Visit (INDEPENDENT_AMBULATORY_CARE_PROVIDER_SITE_OTHER): Payer: 59

## 2022-02-16 DIAGNOSIS — M76822 Posterior tibial tendinitis, left leg: Secondary | ICD-10-CM | POA: Diagnosis not present

## 2022-02-16 NOTE — Progress Notes (Signed)
Office Visit Note   Patient: Zoe Bailey           Date of Birth: 11-18-1965           MRN: [id number] Visit Date: 02/16/2022              Requested by: Fayrene Helper, MD [address], [place],  [place] [address] PCP: Fayrene Helper, MD  Chief Complaint  Patient presents with   Left Foot - Routine Post Op    01/16/22 fusion of talonavicular and subtalar joint      HPI: Patient is a 56 year old woman who presents 4 weeks status post left foot talonavicular and subtalar fusion for posterior tibial tendon insufficiency.  Patient has been in a fracture boot.  Assessment & Plan: Visit Diagnoses:  1. Posterior tibial tendon dysfunction (PTTD) of left lower extremity     Plan: Patient is given instructions to start working on range of motion of her toes and ankle as well as elevation to help decrease the swelling.  Three-view radiographs of the left foot and follow-up at which time anticipate we can advance her to full weightbearing.  A note was written for the patient that anticipate that her total out of work would be 3 months after surgery.  Follow-Up Instructions: Return in about 4 weeks (around 03/16/2022).   Ortho Exam  Patient is alert, oriented, no adenopathy, well-dressed, normal affect, normal respiratory effort. Examination patient does have swelling in her foot but there is no ulcers no drainage no cellulitis.  There is been no change in the alignment of the staple that backed out after a fall after surgery.  Imaging: XR Foot Complete Left  Result Date: 02/16/2022 Three-view radiographs of the left foot shows stable internal fixation there has been no interval displacement of the talonavicular staple.  No images are attached to the encounter.  Labs: Lab Results  Component Value Date   HGBA1C 5.5 09/05/2020   HGBA1C 5.5 10/05/2019   HGBA1C 5.5 10/05/2019   HGBA1C 5.5 (A) 10/05/2019   HGBA1C 5.5 10/05/2019   ESRSEDRATE 14 05/11/2014   CRP  <0.5 (L) 05/11/2014   REPTSTATUS 09/20/2018 FINAL 09/19/2018   CULT (A) 09/19/2018    <10,000 COLONIES/mL INSIGNIFICANT GROWTH Performed at [facility], [place] [address], [place], [place] [address]    LABORGA ESCHERICHIA COLI 12/26/2015     Lab Results  Component Value Date   ALBUMIN 3.8 09/12/2021   ALBUMIN 4.2 09/05/2020   ALBUMIN 4.0 09/16/2018    No results found for: "MG" Lab Results  Component Value Date   VD25OH 21.32 (L) 09/12/2021   VD25OH 25.1 (L) 09/16/2018   VD25OH 29.5 (L) 04/12/2017    No results found for: "PREALBUMIN"    Latest Ref Rng & Units 01/16/2022    5:52 AM 09/12/2021    9:34 AM 09/05/2020   11:19 AM  CBC EXTENDED  WBC 4.0 - 10.5 K/uL 12.5  10.0  9.8   RBC 3.87 - 5.11 MIL/uL 4.59  4.50  4.29   Hemoglobin 12.0 - 15.0 g/dL 14.0  13.9  13.2   HCT 36.0 - 46.0 % 43.4  41.5  41.6   Platelets 150 - 400 K/uL 263  273  320   NEUT# 1.4 - 7.0 x10E3/uL   6.3   Lymph# 0.7 - 3.1 x10E3/uL   2.8      There is no height or weight on file to calculate BMI.  Orders:  Orders Placed This Encounter  Procedures   XR Foot Complete Left   No orders of the defined types were placed in this encounter.    Procedures: No procedures performed  Clinical Data: No additional findings.  ROS:  All other systems negative, except as noted in the HPI. Review of Systems  Objective: Vital Signs: LMP 04/09/2017 (Approximate)   Specialty Comments:  No specialty comments available.  PMFS History: Patient Active Problem List   Diagnosis Date Noted   Posterior tibial tendinitis, left leg 01/16/2022   Chronic pain 01/09/2022   Vitamin D deficiency 10/28/2021   Annual visit for general adult medical examination with abnormal findings 09/16/2021   Back pain 09/16/2021   Allergic rhinitis 09/16/2021   Prediabetes 10/07/2019   Flat feet, bilateral 07/05/2019   Other chronic pain 06/21/2019   Falls frequently 06/21/2019   Cyst of knee joint 06/21/2019   GERD  (gastroesophageal reflux disease) 01/04/2019   Hoarseness, chronic 12/22/2018   Sigmoid diverticulosis 08/11/2017   Essential hypertension 02/28/2015   Joint pain 02/28/2015   Easy bruising 02/19/2014   GAD (generalized anxiety disorder) 01/04/2013   CMC arthritis, thumb, degenerative 04/02/2011   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/02/2011   Obesity (BMI 30.0-34.9) 10/22/2009   Depression with anxiety 10/22/2009   NICOTINE ADDICTION 10/09/2008   Migraine 02/24/2007   Past Medical History:  Diagnosis Date   Anxiety    Arthritis    Cancer ([place]) 2010 approx   skin cancer   Complication of anesthesia    Wakes up during all surgeries   COPD (chronic obstructive pulmonary disease) ([place])    Depression    Dysphagia 12/22/2018   Foot pain, right 03/2014   Hemorrhoid 08/11/2017   Hypertension    Menometrorrhagia 07/14/2013   Migraine    USES BOTOX FOR TREATMENT    Oral phase dysphagia 01/24/2019   Added automatically from request for surgery [id number]   Polymenorrhea 07/11/2013   Started two cycles per month since 03/2013   Shortness of breath dyspnea    Toenail fungus     Family History  Problem Relation Age of Onset   Arthritis Other    Cancer Other     Past Surgical History:  Procedure Laterality Date   ANKLE FRACTURE SURGERY Right    BREAST ENHANCEMENT SURGERY     CARPAL TUNNEL RELEASE Right    x2   CHONDROPLASTY Right 02/17/2018   Procedure: CHONDROPLASTY;  Surgeon: Paralee Cancel, MD;  [facility];  Service: Orthopedics;  Laterality: Right;   COLONOSCOPY WITH PROPOFOL N/A 05/07/2017   Procedure: COLONOSCOPY WITH PROPOFOL;  Surgeon: Rogene Houston, MD;  [facility];  Service: Endoscopy;  Laterality: N/A;  8:30   ESOPHAGOGASTRODUODENOSCOPY (EGD) WITH PROPOFOL N/A 03/06/2019   Procedure: ESOPHAGOGASTRODUODENOSCOPY (EGD) WITH PROPOFOL;  Surgeon: Rogene Houston, MD;  [facility];  Service: Endoscopy;  Laterality: N/A;   FOOT  ARTHRODESIS Left 01/16/2022   Procedure: FUSION OF TALONAVICULAR AND SUBTALAR JOINT LEFT FOOT;  Surgeon: Newt Minion, MD;  Location: [facility];  Service: Orthopedics;  Laterality: Left;   HERNIA REPAIR     umbilical   KNEE ARTHROSCOPY WITH MEDIAL MENISECTOMY Right 02/17/2018   Procedure: KNEE ARTHROSCOPY WITH MEDIAL MENISECTOMY;  Surgeon: Paralee Cancel, MD;  [facility];  Service: Orthopedics;  Laterality: Right;   MALONEY DILATION  03/06/2019   Procedure: MALONEY DILATION;  Surgeon: Rogene Houston, MD;  [facility];  Service: Endoscopy;;   NASAL POLYP  SURGERY     POLYPECTOMY     WRIST FUSION Right    Social History   Occupational History   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

## 2022-02-17 ENCOUNTER — Other Ambulatory Visit (HOSPITAL_COMMUNITY): Payer: Self-pay

## 2022-02-18 ENCOUNTER — Other Ambulatory Visit: Payer: Self-pay

## 2022-02-19 ENCOUNTER — Telehealth: Payer: Self-pay | Admitting: Orthopedic Surgery

## 2022-02-19 NOTE — Telephone Encounter (Signed)
Pt called stating that her FMLA rep is asking for an extending out of work 3 months after surgery date. Pt states Dr Sharol Given want her to stay out of work an additional 3 months after surgery. Pt asked for Autumn F to fax an update out of work note to pt's job. Please fax to attention Darlin Drop. Autumn F please fax to number from first set of FMLA forms sent.Pt phone number is [phone].

## 2022-02-19 NOTE — Telephone Encounter (Signed)
[employer] forms received. To [employer]. ?

## 2022-02-20 NOTE — Telephone Encounter (Signed)
Yes, [place] forms were received and sent to [employer]. Let me know if I can do anything. Thanks

## 2022-02-20 NOTE — Telephone Encounter (Signed)
[employer] received something yesterday on this pt can they address?

## 2022-02-23 ENCOUNTER — Telehealth: Payer: Self-pay | Admitting: Family Medicine

## 2022-02-23 NOTE — Telephone Encounter (Signed)
Pls let her know we hope that she is recovering well from recent surgery Also the pain clinic she was referred to will not be able to accept her... please refer to Dr Nicholaus Bloom , I will sign, thanks

## 2022-02-24 NOTE — Telephone Encounter (Signed)
Called patient and left message for them to return call at the office   

## 2022-02-25 DIAGNOSIS — G43719 Chronic migraine without aura, intractable, without status migrainosus: Secondary | ICD-10-CM | POA: Diagnosis not present

## 2022-02-25 DIAGNOSIS — M791 Myalgia, unspecified site: Secondary | ICD-10-CM | POA: Diagnosis not present

## 2022-02-25 DIAGNOSIS — M542 Cervicalgia: Secondary | ICD-10-CM | POA: Diagnosis not present

## 2022-03-11 ENCOUNTER — Other Ambulatory Visit (HOSPITAL_COMMUNITY): Payer: Self-pay

## 2022-03-11 ENCOUNTER — Other Ambulatory Visit: Payer: Self-pay | Admitting: Family Medicine

## 2022-03-12 ENCOUNTER — Encounter: Payer: Self-pay | Admitting: Family Medicine

## 2022-03-12 ENCOUNTER — Other Ambulatory Visit (HOSPITAL_COMMUNITY): Payer: Self-pay

## 2022-03-12 MED ORDER — WEGOVY 1.7 MG/0.75ML ~~LOC~~ SOAJ
1.7000 mg | SUBCUTANEOUS | 1 refills | Status: DC
  Filled 2022-03-12: qty 3, 28d supply, fill #0

## 2022-03-12 MED ORDER — SEMAGLUTIDE-WEIGHT MANAGEMENT 2.4 MG/0.75ML ~~LOC~~ SOAJ
2.4000 mg | SUBCUTANEOUS | 0 refills | Status: DC
  Filled 2022-03-12: qty 3, 28d supply, fill #0

## 2022-03-16 ENCOUNTER — Ambulatory Visit (INDEPENDENT_AMBULATORY_CARE_PROVIDER_SITE_OTHER): Payer: Commercial Managed Care - PPO | Admitting: Orthopedic Surgery

## 2022-03-16 ENCOUNTER — Ambulatory Visit (INDEPENDENT_AMBULATORY_CARE_PROVIDER_SITE_OTHER): Payer: Commercial Managed Care - PPO

## 2022-03-16 ENCOUNTER — Other Ambulatory Visit (HOSPITAL_COMMUNITY): Payer: Self-pay

## 2022-03-16 DIAGNOSIS — M76822 Posterior tibial tendinitis, left leg: Secondary | ICD-10-CM

## 2022-03-17 ENCOUNTER — Encounter: Payer: Self-pay | Admitting: Orthopedic Surgery

## 2022-03-17 NOTE — Progress Notes (Signed)
Office Visit Note   Patient: Zoe Bailey           Date of Birth: [birth year]           MRN: [id number] Visit Date: 03/16/2022              Requested by: Fayrene Helper, MD [address] [place],  [place] [address] PCP: Fayrene Helper, MD  Chief Complaint  Patient presents with   Left Foot - Routine Post Op    01/16/22 fusion of talonavicular and subtalar joint      HPI: Patient is 2 months status post talonavicular and subtalar fusion.  Patient states she feels good.  Assessment & Plan: Visit Diagnoses:  1. Posterior tibial tendon dysfunction (PTTD) of left lower extremity     Plan: Patient will advance to weightbearing as tolerated in a stiff soled sneaker.  Anticipate return to work February 2.  Repeat radiographs of the left foot at follow-up.  Anticipate we will need set up for removal of the staple.  Follow-Up Instructions: No follow-ups on file.   Ortho Exam  Patient is alert, oriented, no adenopathy, well-dressed, normal affect, normal respiratory effort. Examination there is no tenting of the skin and no skin changes the staple is palpable.  Patient has good range of motion of her ankle  Imaging: XR Foot Complete Left  Result Date: 03/17/2022 Three-view radiographs of the left foot shows stable talonavicular and subtalar fusion.  The staple has not backed out any further.  No images are attached to the encounter.  Labs: Lab Results  Component Value Date   HGBA1C 5.5 09/05/2020   HGBA1C 5.5 10/05/2019   HGBA1C 5.5 10/05/2019   HGBA1C 5.5 (A) 10/05/2019   HGBA1C 5.5 10/05/2019   ESRSEDRATE 14 05/11/2014   CRP <0.5 (L) 05/11/2014   REPTSTATUS 09/20/2018 FINAL 09/19/2018   CULT (A) 09/19/2018    <10,000 COLONIES/mL INSIGNIFICANT GROWTH Performed at [facility],  [address] [place], [place] [address]    LABORGA ESCHERICHIA COLI 12/26/2015     Lab Results  Component Value Date   ALBUMIN 3.8 09/12/2021   ALBUMIN 4.2  09/05/2020   ALBUMIN 4.0 09/16/2018    No results found for: "MG" Lab Results  Component Value Date   VD25OH 21.32 (L) 09/12/2021   VD25OH 25.1 (L) 09/16/2018   VD25OH 29.5 (L) 04/12/2017    No results found for: "PREALBUMIN"    Latest Ref Rng & Units 01/16/2022    5:52 AM 09/12/2021    9:34 AM 09/05/2020   11:19 AM  CBC EXTENDED  WBC 4.0 - 10.5 K/uL 12.5  10.0  9.8   RBC 3.87 - 5.11 MIL/uL 4.59  4.50  4.29   Hemoglobin 12.0 - 15.0 g/dL 14.0  13.9  13.2   HCT 36.0 - 46.0 % 43.4  41.5  41.6   Platelets 150 - 400 K/uL 263  273  320   NEUT# 1.4 - 7.0 x10E3/uL   6.3   Lymph# 0.7 - 3.1 x10E3/uL   2.8      There is no height or weight on file to calculate BMI.  Orders:  Orders Placed This Encounter  Procedures   XR Foot Complete Left   No orders of the defined types were placed in this encounter.    Procedures: No procedures performed  Clinical Data: No additional findings.  ROS:  All other systems negative, except as noted in the HPI. Review of Systems  Objective: Vital Signs: LMP 04/09/2017 (Approximate)   Specialty Comments:  No specialty comments available.  PMFS History: Patient Active Problem List   Diagnosis Date Noted   Posterior tibial tendinitis, left leg 01/16/2022   Chronic pain 01/09/2022   Vitamin D deficiency 10/28/2021   Annual visit for general adult medical examination with abnormal findings 09/16/2021   Back pain 09/16/2021   Allergic rhinitis 09/16/2021   Prediabetes 10/07/2019   Flat feet, bilateral 07/05/2019   Other chronic pain 06/21/2019   Falls frequently 06/21/2019   Cyst of knee joint 06/21/2019   GERD (gastroesophageal reflux disease) 01/04/2019   Hoarseness, chronic 12/22/2018   Sigmoid diverticulosis 08/11/2017   Essential hypertension 02/28/2015   Joint pain 02/28/2015   Easy bruising 02/19/2014   GAD (generalized anxiety disorder) 01/04/2013   CMC arthritis, thumb, degenerative 04/02/2011   TFCC (triangular  fibrocartilage complex) injury 04/02/2011   Obesity (BMI 30.0-34.9) 10/22/2009   Depression with anxiety 10/22/2009   NICOTINE ADDICTION 10/09/2008   Migraine 02/24/2007   Past Medical History:  Diagnosis Date   Anxiety    Arthritis    Cancer ([place]) 2010 approx   skin cancer   Complication of anesthesia    Wakes up during all surgeries   COPD (chronic obstructive pulmonary disease) ([place])    Depression    Dysphagia 12/22/2018   Foot pain, right 03/2014   Hemorrhoid 08/11/2017   Hypertension    Menometrorrhagia 07/14/2013   Migraine    USES BOTOX FOR TREATMENT    Oral phase dysphagia 01/24/2019   Added automatically from request for surgery [id number]   Polymenorrhea 07/11/2013   Started two cycles per month since 03/2013   Shortness of breath dyspnea    Toenail fungus     Family History  Problem Relation Age of Onset   Arthritis Other    Cancer Other     Past Surgical History:  Procedure Laterality Date   ANKLE FRACTURE SURGERY Right    BREAST ENHANCEMENT SURGERY     CARPAL TUNNEL RELEASE Right    x2   CHONDROPLASTY Right 02/17/2018   Procedure: CHONDROPLASTY;  Surgeon: Paralee Cancel, MD;  [facility]: [facility];  Service: Orthopedics;  Laterality: Right;   COLONOSCOPY WITH PROPOFOL N/A 05/07/2017   Procedure: COLONOSCOPY WITH PROPOFOL;  Surgeon: Rogene Houston, MD;  [facility];  Service: Endoscopy;  Laterality: N/A;  8:30   ESOPHAGOGASTRODUODENOSCOPY (EGD) WITH PROPOFOL N/A 03/06/2019   Procedure: ESOPHAGOGASTRODUODENOSCOPY (EGD) WITH PROPOFOL;  Surgeon: Rogene Houston, MD;  [facility];  Service: Endoscopy;  Laterality: N/A;   FOOT ARTHRODESIS Left 01/16/2022   Procedure: FUSION OF TALONAVICULAR AND SUBTALAR JOINT LEFT FOOT;  Surgeon: Newt Minion, MD;  Location: [place];  Service: Orthopedics;  Laterality: Left;   HERNIA REPAIR     umbilical   KNEE ARTHROSCOPY WITH MEDIAL MENISECTOMY Right 02/17/2018   Procedure: KNEE ARTHROSCOPY WITH MEDIAL  MENISECTOMY;  Surgeon: Paralee Cancel, MD;  [facility];  Service: Orthopedics;  Laterality: Right;   MALONEY DILATION  03/06/2019   Procedure: MALONEY DILATION;  Surgeon: Rogene Houston, MD;  [facility];  Service: Endoscopy;;   NASAL POLYP SURGERY     POLYPECTOMY     WRIST FUSION Right    Social History   Occupational History   Occupation: TEFL teacher for purchasing    Employer: [employer]  Tobacco Use   Smoking status: Every Day    Packs/day: 0.38    Years: 33.00  Total pack years: 12.54    Types: Cigarettes   Smokeless tobacco: Never  Vaping Use   Vaping Use: Never used  Substance and Sexual Activity   Alcohol use: No    Alcohol/week: 0.0 standard drinks of alcohol   Drug use: No   Sexual activity: Yes    Birth control/protection: None

## 2022-03-30 ENCOUNTER — Telehealth: Payer: Self-pay | Admitting: Orthopedic Surgery

## 2022-03-30 NOTE — Telephone Encounter (Signed)
Called patient back and reschedule with Erin on the 31st at 8:30am per patient's request.

## 2022-03-30 NOTE — Telephone Encounter (Signed)
Patient states she missed a call..please advise.Marland Kitchen

## 2022-03-30 NOTE — Telephone Encounter (Signed)
Patient states she has an appt on the 5th of February and she actually goes back to work on the 5th that morning patient states she needs a earlier appt to get released to return to work. Please advise

## 2022-03-30 NOTE — Telephone Encounter (Signed)
Tried to call patient. No answer. LMOM for patient to call back for rescheduling.

## 2022-04-03 ENCOUNTER — Other Ambulatory Visit: Payer: Self-pay

## 2022-04-03 ENCOUNTER — Other Ambulatory Visit (HOSPITAL_COMMUNITY): Payer: Self-pay

## 2022-04-07 ENCOUNTER — Encounter: Payer: Self-pay | Admitting: Family Medicine

## 2022-04-07 ENCOUNTER — Other Ambulatory Visit (HOSPITAL_COMMUNITY): Payer: Self-pay

## 2022-04-08 ENCOUNTER — Encounter: Payer: Self-pay | Admitting: Family

## 2022-04-08 ENCOUNTER — Other Ambulatory Visit (HOSPITAL_COMMUNITY): Payer: Self-pay

## 2022-04-08 ENCOUNTER — Encounter: Payer: Self-pay | Admitting: Family Medicine

## 2022-04-08 ENCOUNTER — Ambulatory Visit: Payer: Commercial Managed Care - PPO | Admitting: Family

## 2022-04-08 ENCOUNTER — Ambulatory Visit (INDEPENDENT_AMBULATORY_CARE_PROVIDER_SITE_OTHER): Payer: Commercial Managed Care - PPO

## 2022-04-08 DIAGNOSIS — M76822 Posterior tibial tendinitis, left leg: Secondary | ICD-10-CM

## 2022-04-08 DIAGNOSIS — M25572 Pain in left ankle and joints of left foot: Secondary | ICD-10-CM | POA: Diagnosis not present

## 2022-04-08 MED ORDER — GABAPENTIN 400 MG PO CAPS
400.0000 mg | ORAL_CAPSULE | Freq: Four times a day (QID) | ORAL | 1 refills | Status: DC
  Filled 2022-04-08: qty 120, 30d supply, fill #0

## 2022-04-08 NOTE — Progress Notes (Signed)
Post-Op Visit Note   Patient: Zoe Bailey           Date of Birth: 1966/03/04           MRN: [id number] Visit Date: 04/08/2022 PCP: Fayrene Helper, MD  Chief Complaint: No chief complaint on file.   HPI:  HPI The patient is a 57 year old woman who presents in follow-up she is about 3 months status post subtalar and talonavicular fusion on the left she continues to have significant pain she has remained out of work she feels she is not ready to return to work unfortunately she is unable to bear weight in regular shoewear to make it more than 5 steps.  She is currently in her cam walker for support but has pain with weightbearing in her cam as well  Has not had any further injuries or falls  Ortho Exam On examination of the left lower extremity there is no tenting of the skin no significant edema.  The stable however is palpable.  There is good range of motion of the ankle.  Palpable dorsalis pedis pulse.  Visit Diagnoses:  1. 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 Pain in left ankle and joints of left foot     Plan: The patient would like to apply for long-term disability.  Advised we will work on the paperwork for her when received  Will plan for harvesting the staple.  Dr. Sharol Given is planning for this in about 3 more months time.  In the interim feel that she would benefit from a custom orthotic and an AFO for support with her activities of daily living  Follow-Up Instructions: No follow-ups on file.   Imaging: No results found.  Orders:  Orders Placed This Encounter  Procedures   XR Foot Complete Left   No orders of the defined types were placed in this encounter.    PMFS History: Patient Active Problem List   Diagnosis Date Noted   Posterior tibial tendinitis, left leg 01/16/2022   Chronic pain 01/09/2022   Vitamin D deficiency 10/28/2021   Annual visit for general adult medical examination with abnormal findings 09/16/2021    Back pain 09/16/2021   Allergic rhinitis 09/16/2021   Prediabetes 10/07/2019   Flat feet, bilateral 07/05/2019   Other chronic pain 06/21/2019   Falls frequently 06/21/2019   Cyst of knee joint 06/21/2019   GERD (gastroesophageal reflux disease) 01/04/2019   Hoarseness, chronic 12/22/2018   Sigmoid diverticulosis 08/11/2017   Essential hypertension 02/28/2015   Joint pain 02/28/2015   Easy bruising 02/19/2014   GAD (generalized anxiety disorder) 01/04/2013   CMC arthritis, thumb, degenerative 04/02/2011   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/02/2011   Obesity (BMI 30.0-34.9) 10/22/2009   Depression with anxiety 10/22/2009   NICOTINE ADDICTION 10/09/2008   Migraine 02/24/2007   Past Medical History:  Diagnosis Date   Anxiety    Arthritis    Cancer ([place]) 2010 approx   skin cancer   Complication of anesthesia    Wakes up during all surgeries   COPD (chronic obstructive pulmonary disease) ([place])    Depression    Dysphagia 12/22/2018   Foot pain, right 03/2014   Hemorrhoid 08/11/2017   Hypertension    Menometrorrhagia 07/14/2013   Migraine    USES BOTOX FOR TREATMENT    Oral phase dysphagia 01/24/2019   Added automatically from request for surgery [id number]   Polymenorrhea 07/11/2013   Started two cycles per month since 03/2013  Shortness of breath dyspnea    Toenail fungus     Family History  Problem Relation Age of Onset   Arthritis Other    Cancer Other     Past Surgical History:  Procedure Laterality Date   ANKLE FRACTURE SURGERY Right    BREAST ENHANCEMENT SURGERY     CARPAL TUNNEL RELEASE Right    x2   CHONDROPLASTY Right 02/17/2018   Procedure: CHONDROPLASTY;  Surgeon: Paralee Cancel, MD;  [facility];  Service: Orthopedics;  Laterality: Right;   COLONOSCOPY WITH PROPOFOL N/A 05/07/2017   Procedure: COLONOSCOPY WITH PROPOFOL;  Surgeon: Rogene Houston, MD;  [facility];  Service: Endoscopy;  Laterality: N/A;  8:30    ESOPHAGOGASTRODUODENOSCOPY (EGD) WITH PROPOFOL N/A 03/06/2019   Procedure: ESOPHAGOGASTRODUODENOSCOPY (EGD) WITH PROPOFOL;  Surgeon: Rogene Houston, MD;  [facility];  Service: Endoscopy;  Laterality: N/A;   FOOT ARTHRODESIS Left 01/16/2022   Procedure: FUSION OF TALONAVICULAR AND SUBTALAR JOINT LEFT FOOT;  Surgeon: Newt Minion, MD;  Location: [place];  Service: Orthopedics;  Laterality: Left;   HERNIA REPAIR     umbilical   KNEE ARTHROSCOPY WITH MEDIAL MENISECTOMY Right 02/17/2018   Procedure: KNEE ARTHROSCOPY WITH MEDIAL MENISECTOMY;  Surgeon: Paralee Cancel, MD;  [facility];  Service: Orthopedics;  Laterality: Right;   MALONEY DILATION  03/06/2019   Procedure: MALONEY DILATION;  Surgeon: Rogene Houston, MD;  [facility];  Service: Endoscopy;;   NASAL POLYP SURGERY     POLYPECTOMY     WRIST FUSION Right    Social History   Occupational History   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

## 2022-04-08 NOTE — Telephone Encounter (Signed)
See other mychart msg

## 2022-04-10 ENCOUNTER — Ambulatory Visit: Payer: 59 | Admitting: Family Medicine

## 2022-04-13 ENCOUNTER — Ambulatory Visit: Payer: Commercial Managed Care - PPO | Admitting: Orthopedic Surgery

## 2022-04-16 ENCOUNTER — Other Ambulatory Visit (HOSPITAL_COMMUNITY): Payer: Self-pay

## 2022-04-16 ENCOUNTER — Other Ambulatory Visit: Payer: Self-pay | Admitting: Family Medicine

## 2022-04-16 MED ORDER — DICLOFENAC SODIUM 75 MG PO TBEC
75.0000 mg | DELAYED_RELEASE_TABLET | Freq: Every day | ORAL | 2 refills | Status: DC | PRN
  Filled 2022-04-16: qty 30, 30d supply, fill #0
  Filled 2022-05-13 – 2022-05-21 (×2): qty 30, 30d supply, fill #1
  Filled 2022-06-16: qty 30, 30d supply, fill #2

## 2022-04-17 ENCOUNTER — Other Ambulatory Visit (HOSPITAL_COMMUNITY): Payer: Self-pay

## 2022-04-17 ENCOUNTER — Ambulatory Visit: Payer: Commercial Managed Care - PPO | Admitting: Family Medicine

## 2022-04-17 ENCOUNTER — Telehealth: Payer: Self-pay | Admitting: Family

## 2022-04-17 ENCOUNTER — Encounter: Payer: Self-pay | Admitting: Family Medicine

## 2022-04-17 VITALS — BP 142/92 | HR 109 | Ht 61.0 in | Wt 145.0 lb

## 2022-04-17 DIAGNOSIS — I10 Essential (primary) hypertension: Secondary | ICD-10-CM | POA: Diagnosis not present

## 2022-04-17 DIAGNOSIS — E669 Obesity, unspecified: Secondary | ICD-10-CM

## 2022-04-17 DIAGNOSIS — E66811 Obesity, class 1: Secondary | ICD-10-CM

## 2022-04-17 DIAGNOSIS — F411 Generalized anxiety disorder: Secondary | ICD-10-CM

## 2022-04-17 DIAGNOSIS — F17200 Nicotine dependence, unspecified, uncomplicated: Secondary | ICD-10-CM

## 2022-04-17 DIAGNOSIS — F418 Other specified anxiety disorders: Secondary | ICD-10-CM | POA: Diagnosis not present

## 2022-04-17 MED ORDER — NICOTINE 7 MG/24HR TD PT24
7.0000 mg | MEDICATED_PATCH | Freq: Every day | TRANSDERMAL | 0 refills | Status: DC
  Filled 2022-04-17: qty 28, 28d supply, fill #0

## 2022-04-17 MED ORDER — SEMAGLUTIDE-WEIGHT MANAGEMENT 2.4 MG/0.75ML ~~LOC~~ SOAJ
2.4000 mg | SUBCUTANEOUS | 3 refills | Status: DC
  Filled 2022-04-17: qty 3, 28d supply, fill #0
  Filled 2022-05-04 – 2022-05-21 (×4): qty 3, 28d supply, fill #1

## 2022-04-17 NOTE — Telephone Encounter (Signed)
01/16/22 fusion of talonavicular and subtalar joint   pt last saw you in office. Can you give a time frame of how long the pt will be out of work?

## 2022-04-17 NOTE — Telephone Encounter (Signed)
Patient seen 04/08/22. OOW is mentioned but need duration. [employer] is completing forms. Please advise how long patient will be out of work and provide note. Thank you!

## 2022-04-17 NOTE — Telephone Encounter (Signed)
6 months Planning for more surgery in 3 months time

## 2022-04-17 NOTE — Patient Instructions (Signed)
F/U in 7 to 8 weeks, re evaluate blood pressure, call if you need me sooner  PLEASE send a list or pictures of the meds you are taking, blood pressure is high and I need to know what you take so I can address this  Quit date for smoking latest is the day next week when you start the patches  Keep working on moving forward, you have done really VERY well, coping with the challenges , so take the kudos and keep going!  Thanks for choosing [facility], we consider it a privelige to serve you.

## 2022-04-17 NOTE — Telephone Encounter (Signed)
Documented for [employer].

## 2022-04-20 ENCOUNTER — Encounter: Payer: Self-pay | Admitting: Family Medicine

## 2022-04-20 NOTE — Assessment & Plan Note (Signed)
Controlled, no change in medication  

## 2022-04-20 NOTE — Assessment & Plan Note (Signed)
  Patient re-educated about  the importance of commitment to a  minimum of 150 minutes of exercise per week as able.  The importance of healthy food choices with portion control discussed, as well as eating regularly and within a 12 hour window most days. The need to choose "clean , green" food 50 to 75% of the time is discussed, as well as to make water the primary drink and set a goal of 64 ounces water daily.       04/17/2022    1:38 PM 01/16/2022    6:11 AM 01/15/2022    5:52 PM  Weight /BMI  Weight 145 lb 158 lb 158 lb  Height '5\' 1"'$  (1.549 m) '5\' 1"'$  (1.549 m) '5\' 1"'$  (1.549 m)  BMI 27.4 kg/m2 29.85 kg/m2 29.85 kg/m2    Doing very well on medication nearing ideal body weight continue med

## 2022-04-20 NOTE — Assessment & Plan Note (Addendum)
Asked:confirms currently smokes cigarettes Assess: Unwilling to set a quit date, but is cutting back, plans to get patches and will quit n that date Advise: needs to QUIT to reduce risk of cancer, cardio and cerebrovascular disease Assist: counseled for 5 minutes and literature provided Arrange: follow up in 2 to 4 months

## 2022-04-20 NOTE — Assessment & Plan Note (Signed)
Elevated at visit , pt to send photos / bring meds she is taking so this can be clarified DASH diet and commitment to daily physical activity for a minimum of 30 minutes discussed and encouraged, as a part of hypertension management. The importance of attaining a healthy weight is also discussed.     04/17/2022    2:10 PM 04/17/2022    1:41 PM 04/17/2022    1:38 PM 01/16/2022    9:45 AM 01/16/2022    9:30 AM 01/16/2022    9:00 AM 01/16/2022    8:55 AM  BP/Weight  Systolic BP [id number] [id number] [id number]  Diastolic BP 92 82 93 77 76 70 69  Wt. (Lbs)   145      BMI   27.4 kg/m2

## 2022-04-20 NOTE — Assessment & Plan Note (Signed)
Controlled and coping well despite health challenges, no indication for therapy now, anxious in the event that she is unable to return to work

## 2022-04-20 NOTE — Progress Notes (Signed)
Zoe Bailey     MRN: [id number]      DOB: Feb 03, 1966   HPI Zoe Bailey is here for follow up and re-evaluation of chronic medical conditions, medication management and review of any available recent lab and radiology data.  Preventive health is updated, specifically  Cancer screening and Immunization.   Was recovering well fron left foot surgery but unfortunately fell 2 weeks after and her healing has been set back to the extent that she has been taken out of work indefinitely and is seeking long term disability She is dealing with this as best able but it has been a struggle with her chronic depression , she has increased smoking to cope but is highly motivated to quitting to help healing process Happy with weight loss and has had no adverse s/e from medication wants to continue till within ideal body weight range ROS Denies recent fever or chills. Denies sinus pressure, nasal congestion, ear pain or sore throat. Denies chest congestion, productive cough or wheezing. Denies chest pains, palpitations and leg swelling Denies abdominal pain, nausea, vomiting,diarrhea or constipation.   Denies dysuria, frequency, hesitancy or incontinence.  Denies skin break down or rash.   PE  BP (!) 142/92   Pulse (!) 109   Ht 5' 1"$  (1.549 m)   Wt 145 lb (65.8 kg)   LMP 04/09/2017 (Approximate)   SpO2 94%   BMI 27.40 kg/m   Patient alert and oriented and in no cardiopulmonary distress.  HEENT: No facial asymmetry, EOMI,     Neck supple .  Chest: Clear to auscultation bilaterally.  CVS: S1, S2 no murmurs, no S3.Regular rate.  ABD: Soft non tender.   Ext: No edema  MS: Adequate ROM spine, shoulders, hips and knees.Reduced left ankle  Skin: Intact, no ulcerations or rash noted.  Psych: Good eye contact, normal affect. Memory intact not anxious mildly  depressed appearing.  CNS: CN 2-12 intact, power,  normal throughout.no focal deficits noted.   Assessment & Plan  Essential  hypertension Elevated at visit , pt to send photos / bring meds she is taking so this can be clarified DASH diet and commitment to daily physical activity for a minimum of 30 minutes discussed and encouraged, as a part of hypertension management. The importance of attaining a healthy weight is also discussed.     04/17/2022    2:10 PM 04/17/2022    1:41 PM 04/17/2022    1:38 PM 01/16/2022    9:45 AM 01/16/2022    9:30 AM 01/16/2022    9:00 AM 01/16/2022    8:55 AM  BP/Weight  Systolic BP [id number] A999333 [id number]  Diastolic BP 92 82 93 77 76 70 69  Wt. (Lbs)   145      BMI   27.4 kg/m2           Obesity (BMI 30.0-34.9)  Patient re-educated about  the importance of commitment to a  minimum of 150 minutes of exercise per week as able.  The importance of healthy food choices with portion control discussed, as well as eating regularly and within a 12 hour window most days. The need to choose "clean , green" food 50 to 75% of the time is discussed, as well as to make water the primary drink and set a goal of 64 ounces water daily.       04/17/2022    1:38 PM 01/16/2022    6:11 AM 01/15/2022    5:52  PM  Weight /BMI  Weight 145 lb 158 lb 158 lb  Height 5' 1"$  (1.549 m) 5' 1"$  (1.549 m) 5' 1"$  (1.549 m)  BMI 27.4 kg/m2 29.85 kg/m2 29.85 kg/m2    Doing very well on medication nearing ideal body weight continue med  NICOTINE ADDICTION Asked:confirms currently smokes cigarettes Assess: Unwilling to set a quit date, but is cutting back, plans to get patches and will quit n that date Advise: needs to QUIT to reduce risk of cancer, cardio and cerebrovascular disease Assist: counseled for 5 minutes and literature provided Arrange: follow up in 2 to 4 months   GAD (generalized anxiety disorder) Controlled, no change in medication   Depression with anxiety Controlled and coping well despite health challenges, no indication for therapy now, anxious in the event that she is unable to  return to work

## 2022-04-21 ENCOUNTER — Telehealth: Payer: Self-pay | Admitting: Orthopedic Surgery

## 2022-04-21 ENCOUNTER — Other Ambulatory Visit: Payer: Self-pay | Admitting: Family Medicine

## 2022-04-21 ENCOUNTER — Other Ambulatory Visit (HOSPITAL_COMMUNITY): Payer: Self-pay

## 2022-04-21 DIAGNOSIS — M1711 Unilateral primary osteoarthritis, right knee: Secondary | ICD-10-CM

## 2022-04-21 MED ORDER — GABAPENTIN 400 MG PO CAPS
400.0000 mg | ORAL_CAPSULE | Freq: Four times a day (QID) | ORAL | 2 refills | Status: DC | PRN
  Filled 2022-04-21 – 2022-05-21 (×3): qty 120, 30d supply, fill #0
  Filled 2022-06-29: qty 120, 30d supply, fill #1
  Filled 2022-06-29: qty 120, 30d supply, fill #0
  Filled 2022-08-07: qty 120, 30d supply, fill #1

## 2022-04-21 NOTE — Telephone Encounter (Signed)
I called the patient, lvm explaining the process for [employer].

## 2022-04-21 NOTE — Telephone Encounter (Signed)
Patient presented to the office stating it was time for her to have her injection and she is stating a prior authorization is typically needed.  [phone]

## 2022-04-22 NOTE — Telephone Encounter (Signed)
Put in order will call her when we have approval and meds here

## 2022-04-23 ENCOUNTER — Other Ambulatory Visit (HOSPITAL_COMMUNITY): Payer: Self-pay

## 2022-04-23 MED ORDER — TRIAMTERENE-HCTZ 75-50 MG PO TABS
1.0000 | ORAL_TABLET | Freq: Every day | ORAL | 3 refills | Status: DC
  Filled 2022-04-23: qty 90, 90d supply, fill #0
  Filled 2022-07-23: qty 90, 90d supply, fill #1
  Filled 2023-02-08: qty 60, 60d supply, fill #2
  Filled 2023-04-21: qty 60, 60d supply, fill #3

## 2022-04-23 MED ORDER — CARVEDILOL 3.125 MG PO TABS
3.1250 mg | ORAL_TABLET | Freq: Two times a day (BID) | ORAL | 3 refills | Status: DC
  Filled 2022-04-23: qty 60, 30d supply, fill #0

## 2022-04-23 MED ORDER — AMLODIPINE BESYLATE 10 MG PO TABS
10.0000 mg | ORAL_TABLET | Freq: Every day | ORAL | 3 refills | Status: DC
  Filled 2022-04-23: qty 90, 90d supply, fill #0
  Filled 2022-07-23: qty 90, 90d supply, fill #1
  Filled 2023-01-13: qty 90, 90d supply, fill #2
  Filled 2023-04-21: qty 90, 90d supply, fill #3

## 2022-04-23 NOTE — Telephone Encounter (Signed)
Patient aware.

## 2022-04-23 NOTE — Addendum Note (Signed)
Addended by: Tula Nakayama E on: 04/23/2022 12:55 PM   Modules accepted: Orders

## 2022-05-01 ENCOUNTER — Telehealth: Payer: Self-pay | Admitting: Family Medicine

## 2022-05-01 ENCOUNTER — Encounter: Payer: Self-pay | Admitting: Family Medicine

## 2022-05-01 NOTE — Telephone Encounter (Signed)
Disability Forms    Noted  Copied Sleeved   Original in provider box. Copy in brown folder front desk

## 2022-05-01 NOTE — Telephone Encounter (Signed)
I tried calling to speak with patient re paperwork for long term disability which I received today,. And had to leave her a message to call back to Nurse. In summary, she is out because of foot surgery,  I am unable to determine and am not  responsible for management of her care for this Specifics are being requested re treatment plan, limitations in function etc which the Orthopedic Surgeon is able to address, and I am not I recommend that she ask Dr Sharol Given to complete the form I have left it in your work area. Thanks

## 2022-05-04 ENCOUNTER — Other Ambulatory Visit (HOSPITAL_COMMUNITY): Payer: Self-pay

## 2022-05-04 ENCOUNTER — Telehealth: Payer: Self-pay | Admitting: Family Medicine

## 2022-05-04 NOTE — Telephone Encounter (Signed)
Spoke with patient she is aware Dr Jess Barters office would need to complete her FMLA paperwork put back at the front desk for patient to pick up

## 2022-05-04 NOTE — Telephone Encounter (Signed)
Patient wants a call back in regard to disability forms. States that' \\because'$  PCP was referring provider she may need a letter stating such. Patient wants a call back to future explain.

## 2022-05-04 NOTE — Telephone Encounter (Signed)
Patient aware see other tele msg

## 2022-05-04 NOTE — Telephone Encounter (Signed)
See other tele msg

## 2022-05-07 ENCOUNTER — Encounter: Payer: Self-pay | Admitting: Radiology

## 2022-05-08 ENCOUNTER — Other Ambulatory Visit (HOSPITAL_COMMUNITY): Payer: Self-pay

## 2022-05-11 ENCOUNTER — Other Ambulatory Visit: Payer: Self-pay

## 2022-05-11 ENCOUNTER — Other Ambulatory Visit (HOSPITAL_COMMUNITY): Payer: Self-pay

## 2022-05-13 ENCOUNTER — Other Ambulatory Visit (HOSPITAL_COMMUNITY): Payer: Self-pay

## 2022-05-14 ENCOUNTER — Telehealth: Payer: Self-pay | Admitting: Orthopedic Surgery

## 2022-05-14 NOTE — Telephone Encounter (Signed)
Kahlani, Georgi called and left a voicemail she was returning your call about knee injections,  and you can call her back at [phone]  Thanks

## 2022-05-15 NOTE — Telephone Encounter (Signed)
Spoke with patient regarding insurance. Due to recent health conditions, she is unable to return to work. There was a few days lapse in her insurance coverage, which is the reason [employer] sent message stating patient did not have insurance. Patient will call or send a mychart message when insurance is back in effect.

## 2022-05-19 ENCOUNTER — Other Ambulatory Visit (HOSPITAL_COMMUNITY): Payer: Self-pay

## 2022-05-19 ENCOUNTER — Other Ambulatory Visit: Payer: Self-pay

## 2022-05-20 ENCOUNTER — Other Ambulatory Visit (HOSPITAL_COMMUNITY): Payer: Self-pay

## 2022-05-21 ENCOUNTER — Other Ambulatory Visit: Payer: Self-pay

## 2022-05-21 ENCOUNTER — Telehealth: Payer: Self-pay

## 2022-05-21 ENCOUNTER — Telehealth: Payer: Self-pay | Admitting: Orthopaedic Surgery

## 2022-05-21 ENCOUNTER — Other Ambulatory Visit (HOSPITAL_COMMUNITY): Payer: Self-pay

## 2022-05-21 MED ORDER — SEMAGLUTIDE-WEIGHT MANAGEMENT 2.4 MG/0.75ML ~~LOC~~ SOAJ
2.4000 mg | SUBCUTANEOUS | 3 refills | Status: DC
  Filled 2022-05-21: qty 3, 28d supply, fill #0
  Filled 2022-06-16 – 2022-06-17 (×2): qty 3, 28d supply, fill #1

## 2022-05-21 NOTE — Telephone Encounter (Signed)
VOB submitted for Euflexxa, right knee  

## 2022-05-21 NOTE — Telephone Encounter (Signed)
Patient called stating that she is no longer w/[employer] and now has [employer], she would like Abby to run her insurance for her injections again.  Pt's # [phone]

## 2022-05-21 NOTE — Telephone Encounter (Signed)
Previously submitted  

## 2022-05-21 NOTE — Telephone Encounter (Signed)
Will hold for Pa

## 2022-05-21 NOTE — Telephone Encounter (Signed)
Euflexxa RT knee. Previously [employer] employee. Had to stop working. Now has CO[employer]RA. Please run. Thank you

## 2022-05-22 ENCOUNTER — Other Ambulatory Visit (HOSPITAL_COMMUNITY): Payer: Self-pay

## 2022-05-22 ENCOUNTER — Other Ambulatory Visit: Payer: Self-pay

## 2022-05-25 ENCOUNTER — Other Ambulatory Visit (HOSPITAL_COMMUNITY): Payer: Self-pay

## 2022-05-25 ENCOUNTER — Other Ambulatory Visit: Payer: Self-pay

## 2022-05-26 ENCOUNTER — Telehealth: Payer: Self-pay

## 2022-05-26 NOTE — Telephone Encounter (Signed)
PA has been started through [employer]uflexxa portal for right knee gel injection. PA pending.

## 2022-05-29 ENCOUNTER — Encounter: Payer: Self-pay | Admitting: Family

## 2022-05-29 ENCOUNTER — Other Ambulatory Visit (INDEPENDENT_AMBULATORY_CARE_PROVIDER_SITE_OTHER): Payer: Commercial Managed Care - PPO

## 2022-05-29 ENCOUNTER — Ambulatory Visit (INDEPENDENT_AMBULATORY_CARE_PROVIDER_SITE_OTHER): Payer: Commercial Managed Care - PPO | Admitting: Family

## 2022-05-29 DIAGNOSIS — M25572 Pain in left ankle and joints of left foot: Secondary | ICD-10-CM

## 2022-05-29 DIAGNOSIS — M76822 Posterior tibial tendinitis, left leg: Secondary | ICD-10-CM

## 2022-05-29 NOTE — Progress Notes (Signed)
Office Visit Note   Patient: Zoe Bailey           Date of Birth: 03-29-1965           MRN: [id number] Visit Date: 05/29/2022              Requested by: Fayrene Helper, MD [address] [place],  [place] [address] PCP: Fayrene Helper, MD  Chief Complaint  Patient presents with   Left Foot - Follow-up    01/16/2022 left subtalar and talonavicular joint fusion       HPI: The patient is a 57 year old woman who presents today in follow-up.  She is status post subtalar and talonavicular fusion on the left she has continued to have significant pain at last visit she was referred to [facility] clinic.  She has obtained her [place] which she states she has not been able to use except for at rest she feels that this does not fit inside her shoe wear.  Today her main concern is some numbness and tingling she has been having in all 5 toes and pain over the medial malleolus.  She feels that the medial ankle pain is from the pressure of her new brace however she is uncertain why the numbness has been coming and going in the forefoot Assessment & Plan: Visit Diagnoses:  1. 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 Pain in left ankle and joints of left foot     Plan: Will discuss case with Dr. Sharol Given.  Concern for nonunion.  With loosening of the hardware as well.  She will continue the [place] we will call her with the plan.  Follow-Up Instructions: No follow-ups on file.   Ortho Exam  Patient is alert, oriented, no adenopathy, well-dressed, normal affect, normal respiratory effort. On examination of the left foot there is no tenting of the skin no edema or erythema.  The incision is well-healed the staple is stable this is palpable through the skin.  She does have a palpable dorsalis pedis pulse.  Toes are normothermic.  Distal neurovascular intact.  Imaging: No results found. No images are attached to the encounter.  Labs: Lab  Results  Component Value Date   HGBA1C 5.5 09/05/2020   HGBA1C 5.5 10/05/2019   HGBA1C 5.5 10/05/2019   HGBA1C 5.5 (A) 10/05/2019   HGBA1C 5.5 10/05/2019   ESRSEDRATE 14 05/11/2014   CRP <0.5 (L) 05/11/2014   REPTSTATUS 09/20/2018 FINAL 09/19/2018   CULT (A) 09/19/2018    <10,000 COLONIES/mL INSIGNIFICANT GROWTH Performed at [facility], [place] [address], [place], [place] [address]    LABORGA ESCHERICHIA COLI 12/26/2015     Lab Results  Component Value Date   ALBUMIN 3.8 09/12/2021   ALBUMIN 4.2 09/05/2020   ALBUMIN 4.0 09/16/2018    No results found for: "MG" Lab Results  Component Value Date   VD25OH 21.32 (L) 09/12/2021   VD25OH 25.1 (L) 09/16/2018   VD25OH 29.5 (L) 04/12/2017    No results found for: "PREALBUMIN"    Latest Ref Rng & Units 01/16/2022    5:52 AM 09/12/2021    9:34 AM 09/05/2020   11:19 AM  CBC EXTENDED  WBC 4.0 - 10.5 K/uL 12.5  10.0  9.8   RBC 3.87 - 5.11 MIL/uL 4.59  4.50  4.29   Hemoglobin 12.0 - 15.0 g/dL 14.0  13.9  13.2   HCT 36.0 - 46.0 % 43.4  41.5  41.6   Platelets 150 - 400 K/uL 263  273  320   NEUT# 1.4 - 7.0 x10E3/uL   6.3   Lymph# 0.7 - 3.1 x10E3/uL   2.8      There is no height or weight on file to calculate BMI.  Orders:  Orders Placed This Encounter  Procedures   XR Foot Complete Left   No orders of the defined types were placed in this encounter.    Procedures: No procedures performed  Clinical Data: No additional findings.  ROS:  All other systems negative, except as noted in the HPI. Review of Systems  Objective: Vital Signs: LMP 04/09/2017 (Approximate)   Specialty Comments:  No specialty comments available.  PMFS History: Patient Active Problem List   Diagnosis Date Noted   Posterior tibial tendinitis, left leg 01/16/2022   Chronic pain 01/09/2022   Vitamin D deficiency 10/28/2021   Annual visit for general adult medical examination with abnormal findings 09/16/2021   Back pain 09/16/2021    Allergic rhinitis 09/16/2021   Prediabetes 10/07/2019   Flat feet, bilateral 07/05/2019   Other chronic pain 06/21/2019   Falls frequently 06/21/2019   Cyst of knee joint 06/21/2019   GERD (gastroesophageal reflux disease) 01/04/2019   Hoarseness, chronic 12/22/2018   Sigmoid diverticulosis 08/11/2017   Essential hypertension 02/28/2015   Joint pain 02/28/2015   Easy bruising 02/19/2014   GAD (generalized anxiety disorder) 01/04/2013   CMC arthritis, thumb, degenerative 04/02/2011   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/02/2011   Obesity (BMI 30.0-34.9) 10/22/2009   Depression with anxiety 10/22/2009   NICOTINE ADDICTION 10/09/2008   Migraine 02/24/2007   Past Medical History:  Diagnosis Date   Anxiety    Arthritis    Cancer ([place]) 2010 approx   skin cancer   Complication of anesthesia    Wakes up during all surgeries   COPD (chronic obstructive pulmonary disease) ([place])    Depression    Dysphagia 12/22/2018   Foot pain, right 03/2014   Hemorrhoid 08/11/2017   Hypertension    Menometrorrhagia 07/14/2013   Migraine    USES BOTOX FOR TREATMENT    Oral phase dysphagia 01/24/2019   Added automatically from request for surgery [id number]   Polymenorrhea 07/11/2013   Started two cycles per month since 03/2013   Shortness of breath dyspnea    Toenail fungus     Family History  Problem Relation Age of Onset   Arthritis Other    Cancer Other     Past Surgical History:  Procedure Laterality Date   ANKLE FRACTURE SURGERY Right    BREAST ENHANCEMENT SURGERY     CARPAL TUNNEL RELEASE Right    x2   CHONDROPLASTY Right 02/17/2018   Procedure: CHONDROPLASTY;  Surgeon: Paralee Cancel, MD;  [facility];  Service: Orthopedics;  Laterality: Right;   COLONOSCOPY WITH PROPOFOL N/A 05/07/2017   Procedure: COLONOSCOPY WITH PROPOFOL;  Surgeon: Rogene Houston, MD;  [facility];  Service: Endoscopy;  Laterality: N/A;  8:30   ESOPHAGOGASTRODUODENOSCOPY (EGD)  WITH PROPOFOL N/A 03/06/2019   Procedure: ESOPHAGOGASTRODUODENOSCOPY (EGD) WITH PROPOFOL;  Surgeon: Rogene Houston, MD;  [facility];  Service: Endoscopy;  Laterality: N/A;   FOOT ARTHRODESIS Left 01/16/2022   Procedure: FUSION OF TALONAVICULAR AND SUBTALAR JOINT LEFT FOOT;  Surgeon: Newt Minion, MD;  Location: [place];  Service: Orthopedics;  Laterality: Left;   HERNIA REPAIR     umbilical   KNEE ARTHROSCOPY WITH MEDIAL MENISECTOMY  Right 02/17/2018   Procedure: KNEE ARTHROSCOPY WITH MEDIAL MENISECTOMY;  Surgeon: Paralee Cancel, MD;  [facility];  Service: Orthopedics;  Laterality: Right;   MALONEY DILATION  03/06/2019   Procedure: MALONEY DILATION;  Surgeon: Rogene Houston, MD;  [facility];  Service: Endoscopy;;   NASAL POLYP SURGERY     POLYPECTOMY     WRIST FUSION Right    Social History   Occupational History   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

## 2022-06-08 DIAGNOSIS — M25572 Pain in left ankle and joints of left foot: Secondary | ICD-10-CM | POA: Diagnosis not present

## 2022-06-08 DIAGNOSIS — M79641 Pain in right hand: Secondary | ICD-10-CM | POA: Diagnosis not present

## 2022-06-08 DIAGNOSIS — G8929 Other chronic pain: Secondary | ICD-10-CM | POA: Diagnosis not present

## 2022-06-08 DIAGNOSIS — M79642 Pain in left hand: Secondary | ICD-10-CM | POA: Diagnosis not present

## 2022-06-08 DIAGNOSIS — M170 Bilateral primary osteoarthritis of knee: Secondary | ICD-10-CM | POA: Diagnosis not present

## 2022-06-10 ENCOUNTER — Telehealth: Payer: Self-pay

## 2022-06-10 NOTE — Telephone Encounter (Signed)
Talked with patient and advised her that an authorization was submitted to [facility] over a week ago and that I will call [employer] to check the status.  Patient voiced that she understands.

## 2022-06-11 ENCOUNTER — Telehealth: Payer: Self-pay

## 2022-06-11 NOTE — Telephone Encounter (Signed)
Talked with [employer] to check status of PA and per Zola Button is not a preferred product and there was an issue with patient's insurance.  The preferred products are Orthovisc/Monovisc.  Switched to [employer] and PA could take up to 7-15 calendar days per [place]. Pending PA# [id number]

## 2022-06-12 ENCOUNTER — Ambulatory Visit: Payer: Commercial Managed Care - PPO | Admitting: Family Medicine

## 2022-06-12 ENCOUNTER — Other Ambulatory Visit (HOSPITAL_COMMUNITY): Payer: Self-pay

## 2022-06-12 ENCOUNTER — Encounter: Payer: Self-pay | Admitting: Family Medicine

## 2022-06-12 ENCOUNTER — Other Ambulatory Visit (HOSPITAL_COMMUNITY): Payer: Self-pay | Admitting: Family Medicine

## 2022-06-12 VITALS — BP 124/84 | HR 94 | Ht 61.0 in | Wt 139.0 lb

## 2022-06-12 DIAGNOSIS — G894 Chronic pain syndrome: Secondary | ICD-10-CM | POA: Diagnosis not present

## 2022-06-12 DIAGNOSIS — E559 Vitamin D deficiency, unspecified: Secondary | ICD-10-CM | POA: Diagnosis not present

## 2022-06-12 DIAGNOSIS — G43119 Migraine with aura, intractable, without status migrainosus: Secondary | ICD-10-CM

## 2022-06-12 DIAGNOSIS — Z13220 Encounter for screening for lipoid disorders: Secondary | ICD-10-CM | POA: Diagnosis not present

## 2022-06-12 DIAGNOSIS — E663 Overweight: Secondary | ICD-10-CM | POA: Diagnosis not present

## 2022-06-12 DIAGNOSIS — Z1231 Encounter for screening mammogram for malignant neoplasm of breast: Secondary | ICD-10-CM

## 2022-06-12 DIAGNOSIS — I10 Essential (primary) hypertension: Secondary | ICD-10-CM | POA: Diagnosis not present

## 2022-06-12 DIAGNOSIS — F17211 Nicotine dependence, cigarettes, in remission: Secondary | ICD-10-CM | POA: Diagnosis not present

## 2022-06-12 DIAGNOSIS — F418 Other specified anxiety disorders: Secondary | ICD-10-CM | POA: Diagnosis not present

## 2022-06-12 DIAGNOSIS — R87810 Cervical high risk human papillomavirus (HPV) DNA test positive: Secondary | ICD-10-CM

## 2022-06-12 DIAGNOSIS — R87610 Atypical squamous cells of undetermined significance on cytologic smear of cervix (ASC-US): Secondary | ICD-10-CM

## 2022-06-12 DIAGNOSIS — F17201 Nicotine dependence, unspecified, in remission: Secondary | ICD-10-CM | POA: Insufficient documentation

## 2022-06-12 DIAGNOSIS — F17200 Nicotine dependence, unspecified, uncomplicated: Secondary | ICD-10-CM

## 2022-06-12 DIAGNOSIS — R7303 Prediabetes: Secondary | ICD-10-CM

## 2022-06-12 MED ORDER — DULOXETINE HCL 60 MG PO CPEP
60.0000 mg | ORAL_CAPSULE | Freq: Two times a day (BID) | ORAL | 5 refills | Status: DC
  Filled 2022-06-12: qty 60, 30d supply, fill #0
  Filled 2022-10-30: qty 60, 30d supply, fill #1

## 2022-06-12 NOTE — Patient Instructions (Addendum)
Annual exam in September, call if you need me sooner  You are referred to [facility] for pap in July  Nurse Please refer for lung cancer screening has over 20 pack year history  Please schedule mammogram at checkout  All the best with Surgery  Fasting  CBC, lipid, cmp and EGFr , TSH and Vit D 3 to 7 days before September appointment  Thankful your health is improving  Thanks for choosing [facility], we consider it a privelige to serve you.

## 2022-06-12 NOTE — Progress Notes (Unsigned)
   Zoe Bailey     MRN: [id number]      DOB: [birth year]   HPI Zoe Bailey is here for follow up and re-evaluation of chronic medical conditions, medication management and review of any available recent lab and radiology data.  Preventive health is updated, specifically  Cancer screening and Immunization.   Questions or concerns regarding consultations or procedures which the PT has had in the interim are  addressed. The PT denies any adverse reactions to current medications since the last visit.  4 month h/o sinus pressure over forehead and cheeks and ears are stopped up, no fever or chills  ROS Denies recent fever or chills. Denies sinus pressure, nasal congestion, ear pain or sore throat. Denies chest congestion, productive cough or wheezing. Denies chest pains, palpitations and leg swelling Denies abdominal pain, nausea, vomiting,diarrhea or constipation.   Denies dysuria, frequency, hesitancy or incontinence. Denies joint pain, swelling and limitation in mobility. Denies headaches, seizures, numbness, or tingling. Denies depression, anxiety or insomnia. Denies skin break down or rash.   PE  BP 124/84   Pulse 94   Ht 5\' 1"  (1.549 m)   Wt 139 lb (63 kg)   LMP 04/09/2017 (Approximate)   SpO2 96%   BMI 26.26 kg/m   Patient alert and oriented and in no cardiopulmonary distress.  HEENT: No facial asymmetry, EOMI,     Neck supple .  Chest: Clear to auscultation bilaterally.  CVS: S1, S2 no murmurs, no S3.Regular rate.  ABD: Soft non tender.   Ext: No edema  MS: Adequate ROM spine, shoulders, hips and knees.  Skin: Intact, no ulcerations or rash noted.  Psych: Good eye contact, normal affect. Memory intact not anxious or depressed appearing.  CNS: CN 2-12 intact, power,  normal throughout.no focal deficits noted.   Assessment & Plan  No problem-specific Assessment & Plan notes found for this encounter.

## 2022-06-14 ENCOUNTER — Encounter: Payer: Self-pay | Admitting: Family Medicine

## 2022-06-14 NOTE — Assessment & Plan Note (Signed)
Updated lab needed at/ before next visit.   

## 2022-06-14 NOTE — Assessment & Plan Note (Signed)
Apointment with new pain management pending, Cymbalta 60 mg twice daily prescribed in office by ne at visit

## 2022-06-14 NOTE — Assessment & Plan Note (Signed)
Quit in 2024, nicotine free in 06/2022, still need annual lung cancer screening

## 2022-06-14 NOTE — Addendum Note (Signed)
Addended by: Kerri Perches on: 06/14/2022 02:51 AM   Modules accepted: Orders

## 2022-06-14 NOTE — Assessment & Plan Note (Signed)
Controlled, no change in medication  

## 2022-06-14 NOTE — Assessment & Plan Note (Signed)
Excellent reponse to med which will end this moth, she is applauded and encouraged to contue healthy food choice and portion control  Patient re-educated about  the importance of commitment to a  minimum of 150 minutes of exercise per week as able.  The importance of healthy food choices with portion control discussed, as well as eating regularly and within a 12 hour window most days. The need to choose "clean , green" food 50 to 75% of the time is discussed, as well as to make water the primary drink and set a goal of 64 ounces water daily.       06/12/2022    1:52 PM 04/17/2022    1:38 PM 01/16/2022    6:11 AM  Weight /BMI  Weight 139 lb 145 lb 158 lb  Height 5\' 1"  (1.549 m) 5\' 1"  (1.549 m) 5\' 1"  (1.549 m)  BMI 26.26 kg/m2 27.4 kg/m2 29.85 kg/m2

## 2022-06-14 NOTE — Assessment & Plan Note (Signed)
Controlled  on meds, Zoe Bailey has accepted the fact that her current physical health makes it impossible to work at this time an she is using this time away for physical an mental healing with sucess

## 2022-06-14 NOTE — Assessment & Plan Note (Signed)
Controlled, no change in medication DASH diet and commitment to daily physical activity for a minimum of 30 minutes discussed and encouraged, as a part of hypertension management. The importance of attaining a healthy weight is also discussed.     06/12/2022    2:40 PM 06/12/2022    1:52 PM 04/17/2022    2:10 PM 04/17/2022    1:41 PM 04/17/2022    1:38 PM 01/16/2022    9:45 AM 01/16/2022    9:30 AM  BP/Weight  Systolic BP 124 125 142 142 144 158 158  Diastolic BP 84 88 92 82 93 77 76  Wt. (Lbs)  139   145    BMI  26.26 kg/m2   27.4 kg/m2

## 2022-06-16 ENCOUNTER — Other Ambulatory Visit (HOSPITAL_COMMUNITY): Payer: Self-pay

## 2022-06-18 ENCOUNTER — Telehealth: Payer: Self-pay | Admitting: Family Medicine

## 2022-06-18 ENCOUNTER — Other Ambulatory Visit (HOSPITAL_COMMUNITY): Payer: Self-pay

## 2022-06-18 ENCOUNTER — Encounter: Payer: Self-pay | Admitting: Family Medicine

## 2022-06-18 DIAGNOSIS — Z122 Encounter for screening for malignant neoplasm of respiratory organs: Secondary | ICD-10-CM

## 2022-06-18 MED ORDER — MONTELUKAST SODIUM 10 MG PO TABS
10.0000 mg | ORAL_TABLET | Freq: Every day | ORAL | 1 refills | Status: DC
  Filled 2022-06-18 – 2022-06-29 (×2): qty 90, 90d supply, fill #0
  Filled 2023-01-13: qty 90, 90d supply, fill #1

## 2022-06-18 NOTE — Addendum Note (Signed)
Addended by: Syliva Overman E on: 06/18/2022 11:05 PM   Modules accepted: Orders

## 2022-06-18 NOTE — Telephone Encounter (Signed)
Patient called said she was just seen on 04.05.2024 and provider was suppose to send in some singular and and antibiotic for head congestion to [facility]. Patient went to pharmacy and nothing has been called in.  Also asking about the referral to the lung doctor. Asked for nurse to return her call back ASAP [phone].

## 2022-06-19 ENCOUNTER — Encounter: Payer: Self-pay | Admitting: Family Medicine

## 2022-06-19 ENCOUNTER — Other Ambulatory Visit (HOSPITAL_COMMUNITY): Payer: Self-pay

## 2022-06-22 ENCOUNTER — Ambulatory Visit (HOSPITAL_COMMUNITY)
Admission: RE | Admit: 2022-06-22 | Discharge: 2022-06-22 | Disposition: A | Discharge status: Home / Self Care | Payer: Commercial Managed Care - PPO | Source: Ambulatory Visit | Attending: Family Medicine | Admitting: Family Medicine

## 2022-06-22 ENCOUNTER — Encounter (HOSPITAL_COMMUNITY): Payer: Self-pay

## 2022-06-22 DIAGNOSIS — Z1231 Encounter for screening mammogram for malignant neoplasm of breast: Secondary | ICD-10-CM | POA: Diagnosis not present

## 2022-06-24 ENCOUNTER — Other Ambulatory Visit: Payer: Self-pay

## 2022-06-24 ENCOUNTER — Telehealth: Payer: Self-pay | Admitting: Orthopedic Surgery

## 2022-06-24 DIAGNOSIS — M1711 Unilateral primary osteoarthritis, right knee: Secondary | ICD-10-CM

## 2022-06-24 NOTE — Telephone Encounter (Signed)
She has been approved. All information is in her chart under referrals tab. Can you schedule her please? Thank you

## 2022-06-24 NOTE — Telephone Encounter (Signed)
Spoke w/the patient, she stated that she has Cobra and that she has received a letter stating her injections have been approved.  Unless I missed it, I'm not seeing anything, please advise.  Pt's # [id number]

## 2022-06-29 ENCOUNTER — Other Ambulatory Visit: Payer: Self-pay | Admitting: Family Medicine

## 2022-06-29 ENCOUNTER — Other Ambulatory Visit (HOSPITAL_COMMUNITY): Payer: Self-pay

## 2022-06-29 ENCOUNTER — Other Ambulatory Visit: Payer: Self-pay

## 2022-06-29 MED ORDER — ERGOCALCIFEROL 1.25 MG (50000 UT) PO CAPS
50000.0000 [IU] | ORAL_CAPSULE | ORAL | 2 refills | Status: DC
  Filled 2022-06-29: qty 12, 84d supply, fill #0
  Filled 2022-09-21: qty 12, 84d supply, fill #1
  Filled 2022-12-23: qty 12, 84d supply, fill #2

## 2022-07-02 ENCOUNTER — Encounter: Payer: Self-pay | Admitting: Orthopedic Surgery

## 2022-07-02 ENCOUNTER — Ambulatory Visit: Payer: Commercial Managed Care - PPO | Admitting: Orthopedic Surgery

## 2022-07-02 DIAGNOSIS — M1711 Unilateral primary osteoarthritis, right knee: Secondary | ICD-10-CM

## 2022-07-02 MED ORDER — HYALURONAN 30 MG/2ML IX SOSY
30.0000 mg | PREFILLED_SYRINGE | Freq: Once | INTRA_ARTICULAR | Status: AC
  Administered 2022-07-02: 30 mg via INTRA_ARTICULAR

## 2022-07-02 NOTE — Progress Notes (Signed)
Procedure note for injection of hyaluronic acid   Diagnosis osteoarthritis of the knee  Chief Complaint  Patient presents with   Injections    Right knee #1 Orthovisc      Verbal consent was obtained to inject the knee with HYALURONIC ACID . Timeout was completed to confirm the injection site as the right  Knee  Ethyl chloride spray was used for anesthesia Alcohol was used to prep the skin. The infrapatellar lateral portal was used as an injection site and 1 vial of hyaluronic acid  was injected into the knee  1/3 injx  No complications were noted

## 2022-07-09 ENCOUNTER — Ambulatory Visit: Payer: Commercial Managed Care - PPO | Admitting: Orthopedic Surgery

## 2022-07-09 ENCOUNTER — Encounter: Payer: Self-pay | Admitting: Orthopedic Surgery

## 2022-07-09 DIAGNOSIS — G8929 Other chronic pain: Secondary | ICD-10-CM

## 2022-07-09 DIAGNOSIS — M1711 Unilateral primary osteoarthritis, right knee: Secondary | ICD-10-CM

## 2022-07-09 MED ORDER — HYALURONAN 30 MG/2ML IX SOSY
30.0000 mg | PREFILLED_SYRINGE | Freq: Once | INTRA_ARTICULAR | Status: AC
  Administered 2022-07-09: 30 mg via INTRA_ARTICULAR

## 2022-07-09 NOTE — Progress Notes (Signed)
The patient has consented to and requested hyaluronic acid injection in the form of  Orthovisc  Second of 3 injections   The right knee is prepped with alcohol and ethyl chloride  The injection is performed with a 21-gauge needle, via inferolateral approach  No complications were noted  Appropriate instructions post injection were given  Return in 1 week for third injection

## 2022-07-16 ENCOUNTER — Ambulatory Visit: Payer: Commercial Managed Care - PPO | Admitting: Orthopedic Surgery

## 2022-07-16 ENCOUNTER — Encounter: Payer: Self-pay | Admitting: Orthopedic Surgery

## 2022-07-16 DIAGNOSIS — M1711 Unilateral primary osteoarthritis, right knee: Secondary | ICD-10-CM | POA: Diagnosis not present

## 2022-07-16 DIAGNOSIS — G8929 Other chronic pain: Secondary | ICD-10-CM

## 2022-07-16 MED ORDER — HYALURONAN 30 MG/2ML IX SOSY
30.0000 mg | PREFILLED_SYRINGE | Freq: Once | INTRA_ARTICULAR | Status: AC
  Administered 2022-07-16: 30 mg via INTRA_ARTICULAR

## 2022-07-16 NOTE — Progress Notes (Signed)
Chief Complaint  Patient presents with   Injections    Right knee #3 orthovisc   Encounter Diagnoses  Name Primary?   Primary osteoarthritis of right knee Yes   Chronic pain of right knee      The patient has consented to and requested hyaluronic acid injection in the form of  Right knee Orthovisc 3 of 3   The right knee is prepped with alcohol and ethyl chloride  The injection is performed with a 21-gauge needle, via inferolateral approach  No complications were noted  Appropriate instructions post injection were given  Return as needed

## 2022-07-20 ENCOUNTER — Other Ambulatory Visit: Payer: Self-pay | Admitting: Family Medicine

## 2022-07-20 ENCOUNTER — Other Ambulatory Visit (HOSPITAL_COMMUNITY): Payer: Self-pay

## 2022-07-20 MED ORDER — DICLOFENAC SODIUM 75 MG PO TBEC
75.0000 mg | DELAYED_RELEASE_TABLET | Freq: Every day | ORAL | 2 refills | Status: DC | PRN
  Filled 2022-07-20: qty 30, 30d supply, fill #0
  Filled 2022-08-18: qty 30, 30d supply, fill #1
  Filled 2022-09-21: qty 30, 30d supply, fill #2

## 2022-07-21 ENCOUNTER — Other Ambulatory Visit (HOSPITAL_COMMUNITY): Payer: Self-pay

## 2022-07-21 ENCOUNTER — Ambulatory Visit: Payer: Commercial Managed Care - PPO | Admitting: Orthopedic Surgery

## 2022-07-21 ENCOUNTER — Encounter: Payer: Self-pay | Admitting: Orthopedic Surgery

## 2022-07-21 DIAGNOSIS — M25872 Other specified joint disorders, left ankle and foot: Secondary | ICD-10-CM | POA: Diagnosis not present

## 2022-07-21 MED ORDER — LIDOCAINE HCL 1 % IJ SOLN
2.0000 mL | INTRAMUSCULAR | Status: AC | PRN
Start: 2022-07-21 — End: 2022-07-21
  Administered 2022-07-21: 2 mL

## 2022-07-21 MED ORDER — METHYLPREDNISOLONE ACETATE 40 MG/ML IJ SUSP
40.0000 mg | INTRAMUSCULAR | Status: AC | PRN
Start: 2022-07-21 — End: 2022-07-21
  Administered 2022-07-21: 40 mg via INTRA_ARTICULAR

## 2022-07-21 NOTE — Progress Notes (Signed)
Office Visit Note   Patient: Zoe Bailey           Date of Birth: [birth year]           MRN: [id number] Visit Date: 07/21/2022              Requested by: Kerri Perches, MD [address] [place],  [place] [address] PCP: Kerri Perches, MD  Chief Complaint  Patient presents with   Left Foot - Pain      HPI: Patient is a 57 year old woman who was seen with global pain around the left ankle.  Patient has recently undergone hyaluronic acid injections for her knee.  She has been wearing the [place] brace which is caused some pain and discomfort.  She complains of pain over the medial malleolus and lateral malleolus.  Assessment & Plan: Visit Diagnoses:  1. Impingement syndrome of left ankle     Plan: Left ankle was injected she tolerated this well she had immediate relief of her pain.  She will continue with the [place] brace we will reevaluate in 4 weeks.  Follow-Up Instructions: Return in about 4 weeks (around 08/18/2022).   Ortho Exam  Patient is alert, oriented, no adenopathy, well-dressed, normal affect, normal respiratory effort. Examination patient does not have pain over the posterior tibial tendon peroneal tendon or Achilles.  She is tender maximally over the anterior ankle there is swelling over the anterior lateral aspect of the ankle.  There is no swelling over the talonavicular staple.  The staple is minimally tender to palpation.  The sinus Tarsi is not tender to palpation.  Radiographs show stable fusion with backing out of the staple.  Imaging: No results found. No images are attached to the encounter.  Labs: Lab Results  Component Value Date   HGBA1C 5.5 09/05/2020   HGBA1C 5.5 10/05/2019   HGBA1C 5.5 10/05/2019   HGBA1C 5.5 (A) 10/05/2019   HGBA1C 5.5 10/05/2019   ESRSEDRATE 14 05/11/2014   CRP <0.5 (L) 05/11/2014   REPTSTATUS 09/20/2018 FINAL 09/19/2018   CULT (A) 09/19/2018    <10,000 COLONIES/mL INSIGNIFICANT GROWTH Performed at  [facility], [address] [place], [place] [address]    [facility] ESCHERICHIA COLI 12/26/2015     Lab Results  Component Value Date   ALBUMIN 3.8 09/12/2021   ALBUMIN 4.2 09/05/2020   ALBUMIN 4.0 09/16/2018    No results found for: "MG" Lab Results  Component Value Date   VD25OH 21.32 (L) 09/12/2021   VD25OH 25.1 (L) 09/16/2018   VD25OH 29.5 (L) 04/12/2017    No results found for: "PREALBUMIN"    Latest Ref Rng & Units 01/16/2022    5:52 AM 09/12/2021    9:34 AM 09/05/2020   11:19 AM  CBC EXTENDED  WBC 4.0 - 10.5 K/uL 12.5  10.0  9.8   RBC 3.87 - 5.11 MIL/uL 4.59  4.50  4.29   Hemoglobin 12.0 - 15.0 g/dL 82.9  56.2  13.0   HCT 36.0 - 46.0 % 43.4  41.5  41.6   Platelets 150 - 400 K/uL 263  273  320   NEUT# 1.4 - 7.0 x10E3/uL   6.3   Lymph# 0.7 - 3.1 x10E3/uL   2.8      There is no height or weight on file to calculate BMI.  Orders:  No orders of the defined types were placed in this encounter.  No orders of the defined types were placed in this  encounter.    Procedures: Medium Joint Inj: L ankle on 07/21/2022 4:52 PM Indications: pain and diagnostic evaluation Details: 22 G 1.5 in needle, anteromedial approach Medications: 2 mL lidocaine 1 %; 40 mg methylPREDNISolone acetate 40 MG/ML Outcome: tolerated well, no immediate complications Procedure, treatment alternatives, risks and benefits explained, specific risks discussed. Consent was given by the patient. Immediately prior to procedure a time out was called to verify the correct patient, procedure, equipment, support staff and site/side marked as required. Patient was prepped and draped in the usual sterile fashion.      Clinical Data: No additional findings.  ROS:  All other systems negative, except as noted in the HPI. Review of Systems  Objective: Vital Signs: LMP 04/09/2017 (Approximate)   Specialty Comments:  No specialty comments available.  PMFS History: Patient Active Problem List    Diagnosis Date Noted   Nicotine dependence in remission 06/12/2022   Posterior tibial tendinitis, left leg 01/16/2022   Chronic pain 01/09/2022   Vitamin D deficiency 10/28/2021   Annual visit for general adult medical examination with abnormal findings 09/16/2021   Back pain 09/16/2021   Allergic rhinitis 09/16/2021   Prediabetes 10/07/2019   Flat feet, bilateral 07/05/2019   Other chronic pain 06/21/2019   Falls frequently 06/21/2019   Cyst of knee joint 06/21/2019   GERD (gastroesophageal reflux disease) 01/04/2019   Hoarseness, chronic 12/22/2018   Sigmoid diverticulosis 08/11/2017   Essential hypertension 02/28/2015   Joint pain 02/28/2015   Easy bruising 02/19/2014   GAD (generalized anxiety disorder) 01/04/2013   CMC arthritis, thumb, degenerative 04/02/2011   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/02/2011   Overweight (BMI 25.0-29.9) 10/22/2009   Depression with anxiety 10/22/2009   NICOTINE ADDICTION 10/09/2008   Migraine 02/24/2007   Past Medical History:  Diagnosis Date   Anxiety    Arthritis    Cancer ([facility]) 2010 approx   skin cancer   Complication of anesthesia    Wakes up during all surgeries   COPD (chronic obstructive pulmonary disease) ([facility])    Depression    Dysphagia 12/22/2018   Foot pain, right 03/2014   Hemorrhoid 08/11/2017   Hypertension    Menometrorrhagia 07/14/2013   Migraine    USES BOTOX FOR TREATMENT    Oral phase dysphagia 01/24/2019   Added automatically from request for surgery [id number]   Polymenorrhea 07/11/2013   Started two cycles per month since 03/2013   Shortness of breath dyspnea    Toenail fungus     Family History  Problem Relation Age of Onset   Arthritis Other    Cancer Other     Past Surgical History:  Procedure Laterality Date   ANKLE FRACTURE SURGERY Right    BREAST ENHANCEMENT SURGERY     CARPAL TUNNEL RELEASE Right    x2   CHONDROPLASTY Right 02/17/2018   Procedure: CHONDROPLASTY;  Surgeon:  Durene Romans, MD;  [facility];  Service: Orthopedics;  Laterality: Right;   COLONOSCOPY WITH PROPOFOL N/A 05/07/2017   Procedure: COLONOSCOPY WITH PROPOFOL;  Surgeon: Malissa Hippo, MD;  [facility];  Service: Endoscopy;  Laterality: N/A;  8:30   ESOPHAGOGASTRODUODENOSCOPY (EGD) WITH PROPOFOL N/A 03/06/2019   Procedure: ESOPHAGOGASTRODUODENOSCOPY (EGD) WITH PROPOFOL;  Surgeon: Malissa Hippo, MD;  [facility];  Service: Endoscopy;  Laterality: N/A;   FOOT ARTHRODESIS Left 01/16/2022   Procedure: FUSION OF TALONAVICULAR AND SUBTALAR JOINT LEFT FOOT;  Surgeon: Nadara Mustard, MD;  Location: [facility];  Service: Orthopedics;  Laterality: Left;   HERNIA REPAIR     umbilical   KNEE ARTHROSCOPY WITH MEDIAL MENISECTOMY Right 02/17/2018   Procedure: KNEE ARTHROSCOPY WITH MEDIAL MENISECTOMY;  Surgeon: Durene Romans, MD;  [facility];  Service: Orthopedics;  Laterality: Right;   MALONEY DILATION  03/06/2019   Procedure: MALONEY DILATION;  Surgeon: Malissa Hippo, MD;  [facility];  Service: Endoscopy;;   NASAL POLYP SURGERY     POLYPECTOMY     WRIST FUSION Right    Social History   Occupational History   Occupation: Estate agent for Chief Operating Officer: [employer]  Tobacco Use   Smoking status: Former    Packs/day: 0.38    Years: 33.00    Additional pack years: 0.00    Total pack years: 12.54    Types: Cigarettes   Smokeless tobacco: Never  Vaping Use   Vaping Use: Never used  Substance and Sexual Activity   Alcohol use: No    Alcohol/week: 0.0 standard drinks of alcohol   Drug use: No   Sexual activity: Yes    Birth control/protection: None

## 2022-07-23 ENCOUNTER — Other Ambulatory Visit (HOSPITAL_COMMUNITY): Payer: Self-pay

## 2022-08-07 ENCOUNTER — Other Ambulatory Visit (HOSPITAL_COMMUNITY): Payer: Self-pay

## 2022-08-11 ENCOUNTER — Other Ambulatory Visit (HOSPITAL_COMMUNITY): Payer: Self-pay

## 2022-08-14 ENCOUNTER — Other Ambulatory Visit (HOSPITAL_COMMUNITY): Payer: Self-pay

## 2022-08-17 ENCOUNTER — Other Ambulatory Visit (HOSPITAL_COMMUNITY): Payer: Self-pay

## 2022-08-17 ENCOUNTER — Other Ambulatory Visit: Payer: Self-pay

## 2022-08-17 MED ORDER — ONABOTULINUMTOXINA 100 UNITS IJ SOLR
INTRAMUSCULAR | 3 refills | Status: DC
  Filled 2022-08-17: qty 2, 84d supply, fill #0
  Filled 2022-10-30: qty 2, 84d supply, fill #1

## 2022-08-18 ENCOUNTER — Other Ambulatory Visit (HOSPITAL_COMMUNITY): Payer: Self-pay

## 2022-08-24 ENCOUNTER — Ambulatory Visit: Payer: Commercial Managed Care - PPO | Admitting: Orthopedic Surgery

## 2022-08-24 ENCOUNTER — Encounter: Payer: Self-pay | Admitting: Orthopedic Surgery

## 2022-08-24 DIAGNOSIS — M25572 Pain in left ankle and joints of left foot: Secondary | ICD-10-CM | POA: Diagnosis not present

## 2022-08-24 NOTE — Progress Notes (Signed)
Office Visit Note   Patient: Zoe Bailey           Date of Birth: June 18, 1965           MRN: [id number] Visit Date: 08/24/2022              Requested by: Kerri Perches, MD [address], [address] [place],  [place] [address] PCP: Kerri Perches, MD  Chief Complaint  Patient presents with   Left Ankle - Follow-up      HPI: Patient is a 57 year old woman who is 7 months status post left ankle subtalar and talonavicular fusion for posterior tibial tendon insufficiency.  She is currently using an [place] brace but is having increasing pain and swelling over the lateral aspect of her ankle.  Patient states that with the intra-articular ankle injection she had about a weeks worth of pain relief.  Patient states she is having increasing pain and swelling anterior laterally.  Assessment & Plan: Visit Diagnoses:  1. Pain in left ankle and joints of left foot     Plan: Will obtain a CT scan to evaluate for fibrous nonunion of the subtalar fusion.  Discussed that if there is a fibrous nonunion we would need to remove the hardware and proceed with revision fusion.  Follow-Up Instructions: Return in about 1 week (around 08/31/2022).   Ortho Exam  Patient is alert, oriented, no adenopathy, well-dressed, normal affect, normal respiratory effort. Examination patient has swelling over the anterior lateral ankle joint.  The talonavicular fusion staple is palpable with no skin breakdown.  The sinus Tarsi incision is well-healed no signs of infection.  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  Imaging: No results found. No images are attached to the encounter.  Labs: Lab Results  Component Value Date   HGBA1C 5.5 09/05/2020   HGBA1C 5.5 10/05/2019   HGBA1C 5.5 10/05/2019   HGBA1C 5.5 (A) 10/05/2019   HGBA1C 5.5 10/05/2019   ESRSEDRATE 14 05/11/2014   CRP <0.5 (L) 05/11/2014   REPTSTATUS 09/20/2018  FINAL 09/19/2018   CULT (A) 09/19/2018    <10,000 COLONIES/mL INSIGNIFICANT GROWTH Performed at [facility], [address], [place], [place] [address]    [facility] ESCHERICHIA COLI 12/26/2015     Lab Results  Component Value Date   ALBUMIN 3.8 09/12/2021   ALBUMIN 4.2 09/05/2020   ALBUMIN 4.0 09/16/2018    No results found for: "MG" Lab Results  Component Value Date   VD25OH 21.32 (L) 09/12/2021   VD25OH 25.1 (L) 09/16/2018   VD25OH 29.5 (L) 04/12/2017    No results found for: "PREALBUMIN"    Latest Ref Rng & Units 01/16/2022    5:52 AM 09/12/2021    9:34 AM 09/05/2020   11:19 AM  CBC EXTENDED  WBC 4.0 - 10.5 K/uL 12.5  10.0  9.8   RBC 3.87 - 5.11 MIL/uL 4.59  4.50  4.29   Hemoglobin 12.0 - 15.0 g/dL 13.0  86.5  78.4   HCT 36.0 - 46.0 % 43.4  41.5  41.6   Platelets 150 - 400 K/uL 263  273  320   NEUT# 1.4 - 7.0 x10E3/uL   6.3   Lymph# 0.7 - 3.1 x10E3/uL   2.8      There is no height or weight on file to calculate BMI.  Orders:  Orders Placed This Encounter  Procedures   CT ANKLE LEFT WO  CONTRAST   No orders of the defined types were placed in this encounter.    Procedures: No procedures performed  Clinical Data: No additional findings.  ROS:  All other systems negative, except as noted in the HPI. Review of Systems  Objective: Vital Signs: LMP 04/09/2017 (Approximate)   Specialty Comments:  No specialty comments available.  PMFS History: Patient Active Problem List   Diagnosis Date Noted   Nicotine dependence in remission 06/12/2022   Posterior tibial tendinitis, left leg 01/16/2022   Chronic pain 01/09/2022   Vitamin D deficiency 10/28/2021   Annual visit for general adult medical examination with abnormal findings 09/16/2021   Back pain 09/16/2021   Allergic rhinitis 09/16/2021   Prediabetes 10/07/2019   Flat feet, bilateral 07/05/2019   Other chronic pain 06/21/2019   Falls frequently 06/21/2019   Cyst of knee joint 06/21/2019    GERD (gastroesophageal reflux disease) 01/04/2019   Hoarseness, chronic 12/22/2018   Sigmoid diverticulosis 08/11/2017   Essential hypertension 02/28/2015   Joint pain 02/28/2015   Easy bruising 02/19/2014   GAD (generalized anxiety disorder) 01/04/2013   CMC arthritis, thumb, degenerative 04/02/2011   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/02/2011   Overweight (BMI 25.0-29.9) 10/22/2009   Depression with anxiety 10/22/2009   NICOTINE ADDICTION 10/09/2008   Migraine 02/24/2007   Past Medical History:  Diagnosis Date   Anxiety    Arthritis    Cancer (HCC) 2010 approx   skin cancer   Complication of anesthesia    Wakes up during all surgeries   COPD (chronic obstructive pulmonary disease) (HCC)    Depression    Dysphagia 12/22/2018   Foot pain, right 03/2014   Hemorrhoid 08/11/2017   Hypertension    Menometrorrhagia 07/14/2013   Migraine    USES BOTOX FOR TREATMENT    Oral phase dysphagia 01/24/2019   Added automatically from request for surgery [id number]   Polymenorrhea 07/11/2013   Started two cycles per month since 03/2013   Shortness of breath dyspnea    Toenail fungus     Family History  Problem Relation Age of Onset   Arthritis Other    Cancer Other     Past Surgical History:  Procedure Laterality Date   ANKLE FRACTURE SURGERY Right    BREAST ENHANCEMENT SURGERY     CARPAL TUNNEL RELEASE Right    x2   CHONDROPLASTY Right 02/17/2018   Procedure: CHONDROPLASTY;  Surgeon: Durene Romans, MD;  Location: [facility];  Service: Orthopedics;  Laterality: Right;   COLONOSCOPY WITH PROPOFOL N/A 05/07/2017   Procedure: COLONOSCOPY WITH PROPOFOL;  Surgeon: Malissa Hippo, MD;  [facility];  Service: Endoscopy;  Laterality: N/A;  8:30   ESOPHAGOGASTRODUODENOSCOPY (EGD) WITH PROPOFOL N/A 03/06/2019   Procedure: ESOPHAGOGASTRODUODENOSCOPY (EGD) WITH PROPOFOL;  Surgeon: Malissa Hippo, MD;  [facility];  Service: Endoscopy;  Laterality: N/A;    FOOT ARTHRODESIS Left 01/16/2022   Procedure: FUSION OF TALONAVICULAR AND SUBTALAR JOINT LEFT FOOT;  Surgeon: Nadara Mustard, MD;  Location: [facility] OR;  Service: Orthopedics;  Laterality: Left;   HERNIA REPAIR     umbilical   KNEE ARTHROSCOPY WITH MEDIAL MENISECTOMY Right 02/17/2018   Procedure: KNEE ARTHROSCOPY WITH MEDIAL MENISECTOMY;  Surgeon: Durene Romans, MD;  Location: [facility];  Service: Orthopedics;  Laterality: Right;   MALONEY DILATION  03/06/2019   Procedure: MALONEY DILATION;  Surgeon: Malissa Hippo, MD;  [facility];  Service: Endoscopy;;   NASAL POLYP SURGERY  POLYPECTOMY     WRIST FUSION Right    Social History   Occupational History   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

## 2022-09-07 ENCOUNTER — Ambulatory Visit (HOSPITAL_COMMUNITY)
Admission: RE | Admit: 2022-09-07 | Discharge: 2022-09-07 | Disposition: A | Discharge status: Home / Self Care | Payer: Commercial Managed Care - PPO | Source: Ambulatory Visit | Attending: Orthopedic Surgery | Admitting: Orthopedic Surgery

## 2022-09-07 DIAGNOSIS — M25472 Effusion, left ankle: Secondary | ICD-10-CM | POA: Diagnosis not present

## 2022-09-07 DIAGNOSIS — M19072 Primary osteoarthritis, left ankle and foot: Secondary | ICD-10-CM | POA: Diagnosis not present

## 2022-09-07 DIAGNOSIS — M25572 Pain in left ankle and joints of left foot: Secondary | ICD-10-CM | POA: Diagnosis not present

## 2022-09-07 DIAGNOSIS — M7732 Calcaneal spur, left foot: Secondary | ICD-10-CM | POA: Diagnosis not present

## 2022-09-14 ENCOUNTER — Ambulatory Visit: Payer: Commercial Managed Care - PPO | Admitting: Orthopedic Surgery

## 2022-09-21 ENCOUNTER — Other Ambulatory Visit (HOSPITAL_COMMUNITY): Payer: Self-pay

## 2022-09-21 ENCOUNTER — Ambulatory Visit: Payer: Commercial Managed Care - PPO | Admitting: Orthopedic Surgery

## 2022-09-21 ENCOUNTER — Other Ambulatory Visit: Payer: Self-pay | Admitting: Family Medicine

## 2022-09-21 ENCOUNTER — Other Ambulatory Visit: Payer: Self-pay

## 2022-09-21 DIAGNOSIS — M76822 Posterior tibial tendinitis, left leg: Secondary | ICD-10-CM | POA: Diagnosis not present

## 2022-09-21 DIAGNOSIS — M25572 Pain in left ankle and joints of left foot: Secondary | ICD-10-CM | POA: Diagnosis not present

## 2022-09-21 MED ORDER — GABAPENTIN 400 MG PO CAPS
400.0000 mg | ORAL_CAPSULE | Freq: Four times a day (QID) | ORAL | 2 refills | Status: DC | PRN
  Filled 2022-09-21: qty 120, 30d supply, fill #0
  Filled 2022-10-30: qty 120, 30d supply, fill #1

## 2022-09-21 MED ORDER — GABAPENTIN 400 MG PO CAPS
400.0000 mg | ORAL_CAPSULE | Freq: Four times a day (QID) | ORAL | 2 refills | Status: DC | PRN
  Filled 2022-09-21: qty 120, 30d supply, fill #0

## 2022-09-22 ENCOUNTER — Encounter: Payer: Self-pay | Admitting: Orthopedic Surgery

## 2022-09-22 NOTE — Progress Notes (Signed)
Office Visit Note   Patient: Zoe Bailey           Date of Birth: 07-26-1965           MRN: [id number] Visit Date: 09/21/2022              Requested by: Kerri Perches, MD [address] [place],  [place] [address] PCP: Kerri Perches, MD  Chief Complaint  Patient presents with   Left Ankle - Follow-up    CT scan review       HPI: Patient is a 57 year old woman who presents in follow-up status post CT scan left hindfoot.  Patient states she recently had a fall with a laceration above her right eye secondary to slipping and a ground-level fall.  Assessment & Plan: Visit Diagnoses:  1. 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  Plan for outpatient surgery.  Follow-Up Instructions: Return in about 2 weeks (around 10/05/2022).   Ortho Exam  Patient is alert, oriented, no adenopathy, well-dressed, normal affect, normal respiratory effort. Examination patient Has pain to palpation over the sinus Tarsi and pain to palpation over the talonavicular joint.  Review of the CT scan shows a fibrous nonunion with loosening around the hardware. Imaging: No results found. No images are attached to the encounter.  Labs: Lab Results  Component Value Date   HGBA1C 5.5 09/05/2020   HGBA1C 5.5 10/05/2019   HGBA1C 5.5 10/05/2019   HGBA1C 5.5 (A) 10/05/2019   HGBA1C 5.5 10/05/2019   ESRSEDRATE 14 05/11/2014   CRP <0.5 (L) 05/11/2014   REPTSTATUS 09/20/2018 FINAL 09/19/2018   CULT (A) 09/19/2018    <10,000 COLONIES/mL INSIGNIFICANT GROWTH Performed at [facility], [address], [place], [place] [address]    [facility] ESCHERICHIA COLI 12/26/2015     Lab  Results  Component Value Date   ALBUMIN 3.8 09/12/2021   ALBUMIN 4.2 09/05/2020   ALBUMIN 4.0 09/16/2018    No results found for: "MG" Lab Results  Component Value Date   VD25OH 21.32 (L) 09/12/2021   VD25OH 25.1 (L) 09/16/2018   VD25OH 29.5 (L) 04/12/2017    No results found for: "PREALBUMIN"    Latest Ref Rng & Units 01/16/2022    5:52 AM 09/12/2021    9:34 AM 09/05/2020   11:19 AM  CBC EXTENDED  WBC 4.0 - 10.5 K/uL 12.5  10.0  9.8   RBC 3.87 - 5.11 MIL/uL 4.59  4.50  4.29   Hemoglobin 12.0 - 15.0 g/dL 65.7  84.6  96.2   HCT 36.0 - 46.0 % 43.4  41.5  41.6   Platelets 150 - 400 K/uL 263  273  320   NEUT# 1.4 - 7.0 x10E3/uL   6.3   Lymph# 0.7 - 3.1 x10E3/uL   2.8      There is no height or weight on file to calculate BMI.  Orders:  No orders of the defined types were placed in this encounter.  No orders of the defined types were placed in this encounter.    Procedures: No procedures performed  Clinical Data: No additional findings.  ROS:  All other systems negative, except as  noted in the HPI. Review of Systems  Objective: Vital Signs: LMP 04/09/2017 (Approximate)   Specialty Comments:  No specialty comments available.  PMFS History: Patient Active Problem List   Diagnosis Date Noted   Nicotine dependence in remission 06/12/2022   Posterior tibial tendinitis, left leg 01/16/2022   Chronic pain 01/09/2022   Vitamin D deficiency 10/28/2021   Annual visit for general adult medical examination with abnormal findings 09/16/2021   Back pain 09/16/2021   Allergic rhinitis 09/16/2021   Prediabetes 10/07/2019   Flat feet, bilateral 07/05/2019   Other chronic pain 06/21/2019   Falls frequently 06/21/2019   Cyst of knee joint 06/21/2019   GERD (gastroesophageal reflux disease) 01/04/2019   Hoarseness, chronic 12/22/2018   Sigmoid diverticulosis 08/11/2017   Essential hypertension 02/28/2015   Joint pain 02/28/2015   Easy bruising 02/19/2014   GAD  (generalized anxiety disorder) 01/04/2013   CMC arthritis, thumb, degenerative 04/02/2011   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/02/2011   Overweight (BMI 25.0-29.9) 10/22/2009   Depression with anxiety 10/22/2009   NICOTINE ADDICTION 10/09/2008   Migraine 02/24/2007   Past Medical History:  Diagnosis Date   Anxiety    Arthritis    Cancer (HCC) 2010 approx   skin cancer   Complication of anesthesia    Wakes up during all surgeries   COPD (chronic obstructive pulmonary disease) (HCC)    Depression    Dysphagia 12/22/2018   Foot pain, right 03/2014   Hemorrhoid 08/11/2017   Hypertension    Menometrorrhagia 07/14/2013   Migraine    USES BOTOX FOR TREATMENT    Oral phase dysphagia 01/24/2019   Added automatically from request for surgery [id number]   Polymenorrhea 07/11/2013   Started two cycles per month since 03/2013   Shortness of breath dyspnea    Toenail fungus     Family History  Problem Relation Age of Onset   Arthritis Other    Cancer Other     Past Surgical History:  Procedure Laterality Date   ANKLE FRACTURE SURGERY Right    BREAST ENHANCEMENT SURGERY     CARPAL TUNNEL RELEASE Right    x2   CHONDROPLASTY Right 02/17/2018   Procedure: CHONDROPLASTY;  Surgeon: Durene Romans, MD;  [facility];  Service: Orthopedics;  Laterality: Right;   COLONOSCOPY WITH PROPOFOL N/A 05/07/2017   Procedure: COLONOSCOPY WITH PROPOFOL;  Surgeon: Malissa Hippo, MD;  [facility];  Service: Endoscopy;  Laterality: N/A;  8:30   ESOPHAGOGASTRODUODENOSCOPY (EGD) WITH PROPOFOL N/A 03/06/2019   Procedure: ESOPHAGOGASTRODUODENOSCOPY (EGD) WITH PROPOFOL;  Surgeon: Malissa Hippo, MD;  [facility];  Service: Endoscopy;  Laterality: N/A;   FOOT ARTHRODESIS Left 01/16/2022   Procedure: FUSION OF TALONAVICULAR AND SUBTALAR JOINT LEFT FOOT;  Surgeon: Nadara Mustard, MD;  Location: [facility] OR;  Service: Orthopedics;  Laterality: Left;   HERNIA REPAIR      umbilical   KNEE ARTHROSCOPY WITH MEDIAL MENISECTOMY Right 02/17/2018   Procedure: KNEE ARTHROSCOPY WITH MEDIAL MENISECTOMY;  Surgeon: Durene Romans, MD;  [facility]: [facility];  Service: Orthopedics;  Laterality: Right;   MALONEY DILATION  03/06/2019   Procedure: MALONEY DILATION;  Surgeon: Malissa Hippo, MD;  [facility];  Service: Endoscopy;;   NASAL POLYP SURGERY     POLYPECTOMY     WRIST FUSION Right    Social History   Occupational History   Occupation: Estate agent for purchasing    Employer: [employer]  Tobacco Use  Smoking status: Former    Current packs/day: 0.38    Average packs/day: 0.4 packs/day for 33.0 years (12.5 ttl pk-yrs)    Types: Cigarettes   Smokeless tobacco: Never  Vaping Use   Vaping status: Never Used  Substance and Sexual Activity   Alcohol use: No    Alcohol/week: 0.0 standard drinks of alcohol   Drug use: No   Sexual activity: Yes    Birth control/protection: None

## 2022-09-23 ENCOUNTER — Other Ambulatory Visit (HOSPITAL_COMMUNITY): Payer: Self-pay

## 2022-10-08 ENCOUNTER — Other Ambulatory Visit (HOSPITAL_COMMUNITY): Payer: Self-pay

## 2022-10-08 DIAGNOSIS — H00011 Hordeolum externum right upper eyelid: Secondary | ICD-10-CM | POA: Diagnosis not present

## 2022-10-08 MED ORDER — NEOMYCIN-POLYMYXIN-DEXAMETH 3.5-10000-0.1 OP OINT
TOPICAL_OINTMENT | OPHTHALMIC | 0 refills | Status: DC
  Filled 2022-10-08: qty 3.5, 7d supply, fill #0

## 2022-10-08 MED ORDER — DOXYCYCLINE HYCLATE 100 MG PO CAPS
100.0000 mg | ORAL_CAPSULE | Freq: Every day | ORAL | 0 refills | Status: DC
  Filled 2022-10-08: qty 14, 14d supply, fill #0

## 2022-10-13 ENCOUNTER — Encounter: Payer: Self-pay | Admitting: *Deleted

## 2022-10-20 ENCOUNTER — Other Ambulatory Visit (HOSPITAL_COMMUNITY)
Admission: RE | Admit: 2022-10-20 | Payer: Commercial Managed Care - PPO | Source: Ambulatory Visit | Admitting: Obstetrics & Gynecology

## 2022-10-20 ENCOUNTER — Encounter: Payer: Self-pay | Admitting: Obstetrics & Gynecology

## 2022-10-20 ENCOUNTER — Ambulatory Visit (INDEPENDENT_AMBULATORY_CARE_PROVIDER_SITE_OTHER): Payer: Commercial Managed Care - PPO | Admitting: Obstetrics & Gynecology

## 2022-10-20 VITALS — BP 137/82 | HR 81 | Ht 61.0 in | Wt 152.0 lb

## 2022-10-20 DIAGNOSIS — Z01419 Encounter for gynecological examination (general) (routine) without abnormal findings: Secondary | ICD-10-CM

## 2022-10-20 NOTE — Progress Notes (Signed)
WELL-WOMAN EXAMINATION Patient name: Zoe Bailey MRN [id number]  Date of birth: 23-Sep-1965 Chief Complaint:   Gynecologic Exam  History of Present Illness:   Zoe Bailey is a 57 y.o. G1P1 PM female being seen today for a routine well-woman exam.   Reports no acute GYN concerns.  Denies vaginal bleeding, discharge, itching or irritation.  Denies pelvic or abdominal pain.  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  Last pap see above.  Last mammogram: 06/2022. Last colonoscopy: 2019     10/20/2022    2:12 PM 06/12/2022    1:55 PM 04/17/2022    1:40 PM 01/09/2022    4:47 PM 01/09/2022    4:30 PM  Depression screen PHQ 2/9  Decreased Interest 1 0 1 3 3   Down, Depressed, Hopeless 1 1 1 2 2   PHQ - 2 Score 2 1 2 5 5   Altered sleeping 2 1 1 3 3   Tired, decreased energy 2 1 1 2 2   Change in appetite 2 0 1 2 2   Feeling bad or failure about yourself  1 0 0 2 2  Trouble concentrating 0 0 0 0 0  Moving slowly or fidgety/restless 0 0 0 1 1  Suicidal thoughts 0 0 0 0 0  PHQ-9 Score 9 3 5 15 15   Difficult doing work/chores  Not difficult at all Somewhat difficult Extremely dIfficult Extremely dIfficult     Review of Systems:   Pertinent items are noted in HPI Denies any headaches, blurred vision, fatigue, shortness of breath, chest pain, abdominal pain, bowel movements, urination, or intercourse unless otherwise stated above.  Pertinent History Reviewed:  Reviewed past medical,surgical, social and family history.  Reviewed problem list, medications and allergies. Physical Assessment:   Vitals:   10/20/22 1413  BP: 137/82  Pulse: 81  Weight: 152 lb (68.9 kg)  Height: 5\' 1"  (1.549 m)  Body mass index is 28.72 kg/m.        Physical Examination:   General appearance - well appearing, and in no distress  Mental status - alert, oriented to  person, place, and time  Psych:  She has a normal mood and affect  Skin - warm and dry, normal color, no suspicious lesions noted  Chest - effort normal, all lung fields clear to auscultation bilaterally  Heart - normal rate and regular rhythm  Neck:  midline trachea, no thyromegaly or nodules  Breasts - breasts appear normal, no suspicious masses, no skin or nipple changes or  axillary nodes.  Bilateral implants noted- no acute abnormalities  Abdomen - soft, nontender, nondistended, no masses or organomegaly  Pelvic - VULVA: normal appearing vulva with no masses, tenderness or lesions  VAGINA: normal appearing vagina with normal color and discharge, no lesions  CERVIX: normal appearing cervix without discharge or lesions, no CMT  Thin prep pap is done with HR HPV cotesting  UTERUS: uterus is felt to be normal size, shape, consistency and nontender   ADNEXA: No adnexal masses or tenderness noted.  Extremities:  No swelling or varicosities noted  Chaperone:  Dr. Wyn Forster      Assessment & Plan:  1) Well-Woman Exam -pap collected due to prior dysplasia, further management pending results -Mammogram and colonoscopy up-to-date  No orders of the defined types were placed in this encounter.   Meds: No orders of the defined types were placed in  this encounter.   Follow-up: Return in about 1 year (around 10/20/2023) for Annual.   Myna Hidalgo, DO Attending Obstetrician & Gynecologist, [facility] for [facility], [facility]

## 2022-10-26 ENCOUNTER — Encounter (HOSPITAL_COMMUNITY): Payer: Self-pay | Admitting: Orthopedic Surgery

## 2022-10-26 ENCOUNTER — Other Ambulatory Visit: Payer: Self-pay

## 2022-10-26 NOTE — Pre-Procedure Instructions (Signed)
PCP - Dr. Syliva Overman Cardiologist - Denies  PPM/ICD - Denies  Chest x-ray - N/A EKG - 01/16/22 Stress Test - N/A ECHO - N/A Cardiac Cath - N/A  Sleep Study - Denies  Diabetes: Denies  Blood Thinner Instructions: N/A Aspirin Instructions: N/A  ERAS Protcol - Yes PRE-SURGERY Ensure or G2- No  COVID TEST- N/A   Anesthesia review: No  Patient denies shortness of breath, fever, cough and chest pain at PAT appointment   All instructions explained to the patient, with a verbal understanding of the material. Patient agrees to go over the instructions while at home for a better understanding. Patient also instructed to self quarantine after being tested for COVID-19. The opportunity to ask questions was provided.   Surgical Instructions    Your procedure is scheduled on Wednesday, October 28, 2022 at 10:07 AM.  Report to [facility] Main Entrance "A" at 7:30 A.M., then check in with the Admitting office.  Call this number if you have problems the morning of surgery:  [phone]   If you have any questions prior to your surgery date call [phone]: Open Monday-Friday 8am-4pm If you experience any cold or flu symptoms such as cough, fever, chills, shortness of breath, etc. between now and your scheduled surgery, please notify us at the above number     Remember:  Do not eat after midnight the night before your surgery  You may drink clear liquids until 7:00 AM the morning of your surgery.   Clear liquids allowed are: Water, Non-Citrus Juices (without pulp), Carbonated Beverages, Clear Tea, Black Coffee ONLY (NO MILK, CREAM OR POWDERED CREAMER of any kind), and Gatorade    Take these medicines the morning of surgery with A SIP OF WATER:   amLODipine (NORVASC)  cyclobenzaprine (FLEXERIL)  DULoxetine (CYMBALTA)  gabapentin (NEURONTIN)   IF NEEDED: busPIRone (BUSPAR)  carvedilol (COREG) tiotropium (SPIRIVA HANDIHALER) - Bring with you day of surgery   As of  today, STOP taking any Aspirin (unless otherwise instructed by your surgeon) Aleve, Naproxen, Ibuprofen, Motrin, diclofenac (VOLTAREN), Advil, Goody's, BC's, all herbal medications, fish oil, and all vitamins.  Do not wear jewelry or makeup. Do not wear lotions, powders, perfumes/cologne or deodorant. Do not shave 48 hours prior to surgery. Do not bring valuables to the hospital. Do not wear nail polish, gel polish, artificial nails, or any other type of covering on natural nails (fingers and toes) If you have artificial nails or gel coating that need to be removed by a nail salon, please have this removed prior to surgery. Artificial nails or gel coating may interfere with anesthesia's ability to adequately monitor your vital signs.  [facility] is not responsible for any belongings or valuables.    Do NOT Smoke (Tobacco/Vaping)  24 hours prior to your procedure  If you use a CPAP at night, you may bring your mask for your overnight stay.   Contacts, glasses, hearing aids, dentures or partials may not be worn into surgery, please bring cases for these belongings   For patients admitted to the hospital, discharge time will be determined by your treatment team.   Patients discharged the day of surgery will not be allowed to drive home, and someone needs to stay with them for 24 hours.   SURGICAL WAITING ROOM VISITATION Patients having surgery or a procedure may have no more than 2 support people in the waiting area - these visitors may rotate.   Children under the age of 30 must have an  adult with them who is not the patient. If the patient needs to stay at the hospital during part of their recovery, the visitor guidelines for inpatient rooms apply. Pre-op nurse will coordinate an appropriate time for 1 support person to accompany patient in pre-op.  This support person may not rotate.   Please refer to [email or web address] for the visitor  guidelines for Inpatients (after your surgery is over and you are in a regular room).    Special instructions:    Oral Hygiene is also important to reduce your risk of infection.  Remember - BRUSH YOUR TEETH THE MORNING OF SURGERY WITH YOUR REGULAR TOOTHPASTE   [facility]- Preparing For Surgery  Please follow these instructions carefully.     Shower the NIGHT BEFORE SURGERY and the MORNING OF SURGERY with Mild Soap.   Wear CLEAN PAJAMAS to bed the night before surgery  Place CLEAN SHEETS on your bed the night before your surgery  DO NOT SLEEP WITH PETS.   Day of Surgery:  Wear Clean/Comfortable clothing the morning of surgery Do not apply any deodorants/lotions.   Remember to brush your teeth WITH YOUR REGULAR TOOTHPASTE.    If you received a COVID test during your pre-op visit, it is requested that you wear a mask when out in public, stay away from anyone that may not be feeling well, and notify your surgeon if you develop symptoms. If you have been in contact with anyone that has tested positive in the last 10 days, please notify your surgeon.    Please read over the following fact sheets that you were given.

## 2022-10-27 LAB — CYTOLOGY - PAP
Comment: NEGATIVE
HPV 18 / 45: NEGATIVE
High risk HPV: POSITIVE — AB

## 2022-10-28 ENCOUNTER — Ambulatory Visit (HOSPITAL_BASED_OUTPATIENT_CLINIC_OR_DEPARTMENT_OTHER): Payer: Commercial Managed Care - PPO | Admitting: Anesthesiology

## 2022-10-28 ENCOUNTER — Ambulatory Visit (HOSPITAL_COMMUNITY): Payer: Commercial Managed Care - PPO | Admitting: Anesthesiology

## 2022-10-28 ENCOUNTER — Ambulatory Visit (HOSPITAL_COMMUNITY)
Admission: RE | Admit: 2022-10-28 | Discharge: 2022-10-28 | Disposition: A | Discharge status: Home / Self Care | Payer: Commercial Managed Care - PPO | Attending: Orthopedic Surgery | Admitting: Orthopedic Surgery

## 2022-10-28 ENCOUNTER — Other Ambulatory Visit: Payer: Self-pay

## 2022-10-28 ENCOUNTER — Encounter (HOSPITAL_COMMUNITY): Payer: Self-pay | Admitting: Orthopedic Surgery

## 2022-10-28 ENCOUNTER — Other Ambulatory Visit (HOSPITAL_COMMUNITY): Payer: Self-pay

## 2022-10-28 ENCOUNTER — Encounter (HOSPITAL_COMMUNITY)
Admission: RE | Disposition: A | Discharge status: Home / Self Care | Payer: Self-pay | Source: Home / Self Care | Attending: Orthopedic Surgery

## 2022-10-28 ENCOUNTER — Ambulatory Visit (HOSPITAL_COMMUNITY): Payer: Commercial Managed Care - PPO

## 2022-10-28 DIAGNOSIS — J449 Chronic obstructive pulmonary disease, unspecified: Secondary | ICD-10-CM

## 2022-10-28 DIAGNOSIS — I10 Essential (primary) hypertension: Secondary | ICD-10-CM

## 2022-10-28 DIAGNOSIS — M79672 Pain in left foot: Secondary | ICD-10-CM | POA: Insufficient documentation

## 2022-10-28 DIAGNOSIS — M24675 Ankylosis, left foot: Secondary | ICD-10-CM

## 2022-10-28 DIAGNOSIS — F17210 Nicotine dependence, cigarettes, uncomplicated: Secondary | ICD-10-CM

## 2022-10-28 DIAGNOSIS — M9689 Other intraoperative and postprocedural complications and disorders of the musculoskeletal system: Secondary | ICD-10-CM | POA: Diagnosis not present

## 2022-10-28 DIAGNOSIS — M76822 Posterior tibial tendinitis, left leg: Secondary | ICD-10-CM

## 2022-10-28 DIAGNOSIS — F17200 Nicotine dependence, unspecified, uncomplicated: Secondary | ICD-10-CM | POA: Insufficient documentation

## 2022-10-28 DIAGNOSIS — Z981 Arthrodesis status: Secondary | ICD-10-CM | POA: Diagnosis not present

## 2022-10-28 DIAGNOSIS — M960 Pseudarthrosis after fusion or arthrodesis: Secondary | ICD-10-CM | POA: Insufficient documentation

## 2022-10-28 HISTORY — PX: FOOT ARTHRODESIS: SHX1655

## 2022-10-28 LAB — BASIC METABOLIC PANEL
Anion gap: 10 (ref 5–15)
BUN: 15 mg/dL (ref 6–20)
CO2: 22 mmol/L (ref 22–32)
Calcium: 8.5 mg/dL — ABNORMAL LOW (ref 8.9–10.3)
Chloride: 105 mmol/L (ref 98–111)
Creatinine, Ser: 0.66 mg/dL (ref 0.44–1.00)
GFR, Estimated: >60 mL/min (ref >60)
Glucose, Bld: 92 mg/dL (ref 70–99)
Potassium: 3.8 mmol/L (ref 3.5–5.1)
Sodium: 137 mmol/L (ref 135–145)

## 2022-10-28 LAB — CBC
HCT: 42.1 % (ref 36.0–46.0)
Hemoglobin: 13.5 g/dL (ref 12.0–15.0)
MCH: 30.6 pg (ref 26.0–34.0)
MCHC: 32.1 g/dL (ref 30.0–36.0)
MCV: 95.5 fL (ref 80.0–100.0)
Platelets: 213 10*3/uL (ref 150–400)
RBC: 4.41 MIL/uL (ref 3.87–5.11)
RDW: 13.3 % (ref 11.5–15.5)
WBC: 12.6 10*3/uL — ABNORMAL HIGH (ref 4.0–10.5)
nRBC: 0 % (ref 0.0–0.2)

## 2022-10-28 LAB — TYPE AND SCREEN
ABO/RH(D): A POS
Antibody Screen: NEGATIVE

## 2022-10-28 SURGERY — FUSION, JOINT, FOOT
Anesthesia: Monitor Anesthesia Care | Site: Foot | Laterality: Left

## 2022-10-28 MED ORDER — HYDROCODONE-ACETAMINOPHEN 5-325 MG PO TABS
1.0000 | ORAL_TABLET | ORAL | 0 refills | Status: DC | PRN
  Filled 2022-10-28: qty 30, 5d supply, fill #0

## 2022-10-28 MED ORDER — PROPOFOL 1000 MG/100ML IV EMUL
INTRAVENOUS | Status: AC
  Filled 2022-10-28: qty 100

## 2022-10-28 MED ORDER — ACETAMINOPHEN 500 MG PO TABS: 1000.0000 mg | ORAL_TABLET | Freq: Once | ORAL | Status: DC | PRN

## 2022-10-28 MED ORDER — PROPOFOL 500 MG/50ML IV EMUL
INTRAVENOUS | Status: DC | PRN
  Administered 2022-10-28: 200 ug/kg/min via INTRAVENOUS

## 2022-10-28 MED ORDER — CEFAZOLIN SODIUM-DEXTROSE 2-4 GM/100ML-% IV SOLN
2.0000 g | INTRAVENOUS | Status: AC
  Administered 2022-10-28: 2 g via INTRAVENOUS
  Filled 2022-10-28: qty 100

## 2022-10-28 MED ORDER — KETOROLAC TROMETHAMINE 30 MG/ML IJ SOLN
INTRAMUSCULAR | Status: AC
  Filled 2022-10-28: qty 1

## 2022-10-28 MED ORDER — KETOROLAC TROMETHAMINE 30 MG/ML IJ SOLN
INTRAMUSCULAR | Status: DC | PRN
  Administered 2022-10-28: 30 mg via INTRAVENOUS

## 2022-10-28 MED ORDER — PROPOFOL 10 MG/ML IV BOLUS
INTRAVENOUS | Status: DC | PRN
  Administered 2022-10-28: 20 mg via INTRAVENOUS
  Administered 2022-10-28: 50 mg via INTRAVENOUS
  Administered 2022-10-28 (×2): 10 mg via INTRAVENOUS

## 2022-10-28 MED ORDER — LIDOCAINE 2% (20 MG/ML) 5 ML SYRINGE
INTRAMUSCULAR | Status: AC
  Filled 2022-10-28: qty 5

## 2022-10-28 MED ORDER — ONDANSETRON HCL 4 MG/2ML IJ SOLN
INTRAMUSCULAR | Status: AC
  Filled 2022-10-28: qty 2

## 2022-10-28 MED ORDER — ORAL CARE MOUTH RINSE: 15.0000 mL | Freq: Once | OROMUCOSAL | Status: DC

## 2022-10-28 MED ORDER — OXYCODONE HCL 5 MG PO TABS: 5.0000 mg | ORAL_TABLET | Freq: Once | ORAL | Status: DC | PRN

## 2022-10-28 MED ORDER — OXYCODONE HCL 5 MG/5ML PO SOLN: 5.0000 mg | Freq: Once | ORAL | Status: DC | PRN

## 2022-10-28 MED ORDER — LIDOCAINE 2% (20 MG/ML) 5 ML SYRINGE
INTRAMUSCULAR | Status: DC | PRN
  Administered 2022-10-28: 50 mg via INTRAVENOUS

## 2022-10-28 MED ORDER — FENTANYL CITRATE (PF) 100 MCG/2ML IJ SOLN: 25.0000 ug | INTRAMUSCULAR | Status: DC | PRN

## 2022-10-28 MED ORDER — MIDAZOLAM HCL 2 MG/2ML IJ SOLN
INTRAMUSCULAR | Status: AC
  Administered 2022-10-28: 2 mg via INTRAVENOUS
  Filled 2022-10-28: qty 2

## 2022-10-28 MED ORDER — ACETAMINOPHEN 10 MG/ML IV SOLN: 1000.0000 mg | Freq: Once | INTRAVENOUS | Status: DC | PRN

## 2022-10-28 MED ORDER — DEXAMETHASONE SODIUM PHOSPHATE 10 MG/ML IJ SOLN
INTRAMUSCULAR | Status: AC
  Filled 2022-10-28: qty 1

## 2022-10-28 MED ORDER — 0.9 % SODIUM CHLORIDE (POUR BTL) OPTIME
TOPICAL | Status: DC | PRN
  Administered 2022-10-28: 1000 mL

## 2022-10-28 MED ORDER — PHENYLEPHRINE 80 MCG/ML (10ML) SYRINGE FOR IV PUSH (FOR BLOOD PRESSURE SUPPORT)
PREFILLED_SYRINGE | INTRAVENOUS | Status: AC
  Filled 2022-10-28: qty 10

## 2022-10-28 MED ORDER — ONDANSETRON HCL 4 MG/2ML IJ SOLN
INTRAMUSCULAR | Status: DC | PRN
Start: 2022-10-28 — End: 2022-10-28
  Administered 2022-10-28: 4 mg via INTRAVENOUS

## 2022-10-28 MED ORDER — CHLORHEXIDINE GLUCONATE 0.12 % MT SOLN
15.0000 mL | Freq: Once | OROMUCOSAL | Status: DC
  Filled 2022-10-28: qty 15

## 2022-10-28 MED ORDER — ACETAMINOPHEN 160 MG/5ML PO SOLN: 1000.0000 mg | Freq: Once | ORAL | Status: DC | PRN

## 2022-10-28 MED ORDER — FENTANYL CITRATE (PF) 250 MCG/5ML IJ SOLN
INTRAMUSCULAR | Status: AC
  Filled 2022-10-28: qty 5

## 2022-10-28 MED ORDER — LACTATED RINGERS IV SOLN: INTRAVENOUS | Status: DC

## 2022-10-28 MED ORDER — MIDAZOLAM HCL 2 MG/2ML IJ SOLN: 2.0000 mg | Freq: Once | INTRAMUSCULAR | Status: AC

## 2022-10-28 MED ORDER — FENTANYL CITRATE (PF) 100 MCG/2ML IJ SOLN
INTRAMUSCULAR | Status: AC
  Administered 2022-10-28: 50 ug via INTRAVENOUS
  Filled 2022-10-28: qty 2

## 2022-10-28 MED ORDER — DEXAMETHASONE SODIUM PHOSPHATE 10 MG/ML IJ SOLN
INTRAMUSCULAR | Status: DC | PRN
  Administered 2022-10-28: 4 mg via INTRAVENOUS

## 2022-10-28 MED ORDER — PHENYLEPHRINE 80 MCG/ML (10ML) SYRINGE FOR IV PUSH (FOR BLOOD PRESSURE SUPPORT)
PREFILLED_SYRINGE | INTRAVENOUS | Status: DC | PRN
  Administered 2022-10-28 (×10): 80 ug via INTRAVENOUS

## 2022-10-28 MED ORDER — FENTANYL CITRATE (PF) 100 MCG/2ML IJ SOLN: 50.0000 ug | Freq: Once | INTRAMUSCULAR | Status: AC

## 2022-10-28 SURGICAL SUPPLY — 56 items
BAG COUNTER SPONGE SURGICOUNT (BAG) ×1 IMPLANT
BAG SPNG CNTER NS LX DISP (BAG) ×1
BANDAGE ESMARK 6X9 LF (GAUZE/BANDAGES/DRESSINGS) IMPLANT
BIT DRILL 4.5 TYB 9 (BIT) IMPLANT
BIT DRILL CAL 2.5 ST W/SLV (BIT) IMPLANT
BLADE AVERAGE 25X9 (BLADE) IMPLANT
BLADE SAW SGTL HD 18.5X60.5X1. (BLADE) ×1 IMPLANT
BLADE SURG 10 STRL SS (BLADE) IMPLANT
BLADE SURG 21 STRL SS (BLADE) IMPLANT
BNDG CMPR 5X4 KNIT ELC UNQ LF (GAUZE/BANDAGES/DRESSINGS) ×1
BNDG CMPR 9X6 STRL LF SNTH (GAUZE/BANDAGES/DRESSINGS)
BNDG COHESIVE 4X5 TAN STRL (GAUZE/BANDAGES/DRESSINGS) ×1 IMPLANT
BNDG ELASTIC 4INX 5YD STR LF (GAUZE/BANDAGES/DRESSINGS) IMPLANT
BNDG ESMARK 6X9 LF (GAUZE/BANDAGES/DRESSINGS)
BNDG GAUZE DERMACEA FLUFF 4 (GAUZE/BANDAGES/DRESSINGS) ×2 IMPLANT
BNDG GZE DERMACEA 4 6PLY (GAUZE/BANDAGES/DRESSINGS) ×1
BUR EGG ELITE 4.0 (BURR) IMPLANT
COTTON STERILE ROLL (GAUZE/BANDAGES/DRESSINGS) ×1 IMPLANT
COVER MAYO STAND STRL (DRAPES) IMPLANT
COVER SURGICAL LIGHT HANDLE (MISCELLANEOUS) ×2 IMPLANT
DRAPE INCISE IOBAN 66X45 STRL (DRAPES) ×1 IMPLANT
DRAPE OEC MINIVIEW 54X84 (DRAPES) IMPLANT
DRAPE U-SHAPE 47X51 STRL (DRAPES) ×1 IMPLANT
DRSG ADAPTIC 3X8 NADH LF (GAUZE/BANDAGES/DRESSINGS) ×1 IMPLANT
DURAPREP 26ML APPLICATOR (WOUND CARE) ×1 IMPLANT
ELECT REM PT RETURN 9FT ADLT (ELECTROSURGICAL) ×1
ELECTRODE REM PT RTRN 9FT ADLT (ELECTROSURGICAL) ×1 IMPLANT
GAUZE PAD ABD 8X10 STRL (GAUZE/BANDAGES/DRESSINGS) IMPLANT
GAUZE SPONGE 4X4 12PLY STRL (GAUZE/BANDAGES/DRESSINGS) ×1 IMPLANT
GLOVE BIOGEL PI IND STRL 9 (GLOVE) ×1 IMPLANT
GLOVE SURG ORTHO 9.0 STRL STRW (GLOVE) ×1 IMPLANT
GOWN STRL REUS W/ TWL XL LVL3 (GOWN DISPOSABLE) ×3 IMPLANT
GOWN STRL REUS W/TWL XL LVL3 (GOWN DISPOSABLE) ×3
GUIDEWIRE TYB 2X9 (WIRE) IMPLANT
KIT BASIN OR (CUSTOM PROCEDURE TRAY) ×1 IMPLANT
KIT STRATUM INSTRUMENT STD (KITS) IMPLANT
KIT TURNOVER KIT B (KITS) ×1 IMPLANT
MANIFOLD NEPTUNE II (INSTRUMENTS) ×1 IMPLANT
NS IRRIG 1000ML POUR BTL (IV SOLUTION) ×1 IMPLANT
PACK ORTHO EXTREMITY (CUSTOM PROCEDURE TRAY) ×1 IMPLANT
PAD ARMBOARD 7.5X6 YLW CONV (MISCELLANEOUS) ×2 IMPLANT
PAD CAST 4YDX4 CTTN HI CHSV (CAST SUPPLIES) ×1 IMPLANT
PADDING CAST COTTON 4X4 STRL (CAST SUPPLIES)
PLATE TN STRM SM LT (Plate) IMPLANT
PLATE TN UNIV STRM STD (Plate) IMPLANT
PUTTY DBM STAGRAFT PLUS 5CC (Putty) IMPLANT
SCREW CANN HDLS SHRT 6.5X75 (Screw) IMPLANT
SCREW NL LP STRATUM 3.5X30 (Screw) IMPLANT
SCREW NONLOCK ST 3.5X28 (Screw) IMPLANT
SPONGE T-LAP 18X18 ~~LOC~~+RFID (SPONGE) ×1 IMPLANT
SUCTION TUBE FRAZIER 10FR DISP (SUCTIONS) ×1 IMPLANT
SUT ETHILON 2 0 PSLX (SUTURE) ×3 IMPLANT
TOWEL GREEN STERILE (TOWEL DISPOSABLE) ×1 IMPLANT
TOWEL GREEN STERILE FF (TOWEL DISPOSABLE) ×1 IMPLANT
TUBE CONNECTING 12X1/4 (SUCTIONS) ×1 IMPLANT
WATER STERILE IRR 1000ML POUR (IV SOLUTION) ×1 IMPLANT

## 2022-10-28 NOTE — Progress Notes (Signed)
Orthopedic Tech Progress Note Patient Details:  Zoe Bailey 1965-07-01 [id number] Small CAM walker delivered to bedside in PACU.  Ortho Devices Type of Ortho Device: CAM walker Ortho Device/Splint Location: LLE Ortho Device/Splint Interventions: Ordered      Chanse Kagel E Chitara Clonch 10/28/2022, 11:58 AM

## 2022-10-28 NOTE — H&P (Signed)
Zoe Bailey is an 57 y.o. female.   Chief Complaint: Left foot pain with fibrous union. HPI: Patient is 9 months status post talonavicular and subtalar fusion.  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  Past Medical History:  Diagnosis Date   Anxiety    Arthritis    Cancer (HCC) 2010 approx   skin cancer   Complication of anesthesia    Wakes up during all surgeries   COPD (chronic obstructive pulmonary disease) (HCC)    Depression    Dysphagia 12/22/2018   Foot pain, right 03/2014   Hemorrhoid 08/11/2017   Hypertension    Menometrorrhagia 07/14/2013   Migraine    USES BOTOX FOR TREATMENT    Oral phase dysphagia 01/24/2019   Added automatically from request for surgery [id number]   Polymenorrhea 07/11/2013   Started two cycles per month since 03/2013   Shortness of breath dyspnea    Toenail fungus     Past Surgical History:  Procedure Laterality Date   ANKLE FRACTURE SURGERY Right    BREAST ENHANCEMENT SURGERY     CARPAL TUNNEL RELEASE Right    x2   CHONDROPLASTY Right 02/17/2018   Procedure: CHONDROPLASTY;  Surgeon: Durene Romans, MD;  Location: [facility];  Service: Orthopedics;  Laterality: Right;   COLONOSCOPY WITH PROPOFOL N/A 05/07/2017   Procedure: COLONOSCOPY WITH PROPOFOL;  Surgeon: Malissa Hippo, MD;  [facility];  Service: Endoscopy;  Laterality: N/A;  8:30   ESOPHAGOGASTRODUODENOSCOPY (EGD) WITH PROPOFOL N/A 03/06/2019   Procedure: ESOPHAGOGASTRODUODENOSCOPY (EGD) WITH PROPOFOL;  Surgeon: Malissa Hippo, MD;  [facility];  Service: Endoscopy;  Laterality: N/A;   FOOT ARTHRODESIS Left 01/16/2022   Procedure: FUSION OF TALONAVICULAR AND SUBTALAR JOINT LEFT FOOT;  Surgeon: Nadara Mustard, MD;  Location: [facility] OR;  Service: Orthopedics;  Laterality: Left;   HERNIA REPAIR     umbilical   KNEE ARTHROSCOPY WITH MEDIAL MENISECTOMY Right 02/17/2018   Procedure: KNEE ARTHROSCOPY  WITH MEDIAL MENISECTOMY;  Surgeon: Durene Romans, MD;  Location: [facility];  Service: Orthopedics;  Laterality: Right;   MALONEY DILATION  03/06/2019   Procedure: MALONEY DILATION;  Surgeon: Malissa Hippo, MD;  [facility];  Service: Endoscopy;;   NASAL POLYP SURGERY     POLYPECTOMY     WRIST FUSION Right     Family History  Problem Relation Age of Onset   Arthritis Other    Cancer Other    Social History:  reports that she has been smoking cigarettes. She has a 12.5 pack-year smoking history. She has never used smokeless tobacco. She reports that she does not drink alcohol and does not use drugs.  Allergies:  Allergies  Allergen Reactions   Effexor [Venlafaxine] Other (See Comments)    Decreased libido   Other Other (See Comments)    Pt has a fear of tourniquets    No medications prior to admission.    No results found for this or any previous visit (from the past 48 hour(s)). No results found.  Review of Systems  All other systems reviewed and are negative.   Height 5\' 1"  (1.549 m), weight 68.5 kg, last menstrual period 04/09/2017. Physical Exam  Patient is alert, oriented, no adenopathy, well-dressed, normal affect, normal respiratory effort. Examination patient Has pain to palpation over the sinus Tarsi and pain to palpation over the talonavicular joint.  Review of the CT scan shows  a fibrous nonunion with loosening around the hardware. Assessment/Plan 1. 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

## 2022-10-28 NOTE — Transfer of Care (Signed)
Immediate Anesthesia Transfer of Care Note  Patient: Zoe Bailey  Procedure(s) Performed: REVISION TALONAVICULAR FUSION AND SUBTALAR JOINT LEFT FOOT (Left: Foot)  Patient Location: PACU  Anesthesia Type:MAC  Level of Consciousness: awake, alert , oriented, and patient cooperative  Airway & Oxygen Therapy: Patient Spontanous Breathing  Post-op Assessment: Report given to RN and Post -op Vital signs reviewed and stable  Post vital signs: Reviewed and stable  Last Vitals:  Vitals Value Taken Time  BP 122/98 10/28/22 1126  Temp    Pulse 79 10/28/22 1128  Resp 30 10/28/22 1128  SpO2 90 % 10/28/22 1128  Vitals shown include unfiled device data.  Last Pain:  Vitals:   10/28/22 0823  TempSrc:   PainSc: 7       Patients Stated Pain Goal: 0 (10/28/22 0823)  Complications: No notable events documented.

## 2022-10-28 NOTE — Op Note (Signed)
10/28/2022  11:21 AM  PATIENT:  Zoe Bailey    PRE-OPERATIVE DIAGNOSIS:  Fibrous Non-Union Left Foot Fusion  POST-OPERATIVE DIAGNOSIS:  Same  PROCEDURE:  REVISION TALONAVICULAR FUSION AND SUBTALAR JOINT LEFT FOOT Application of bone graft putty 5 cc. C-arm fluoroscopy to verify reduction. Removal of deep retained staple and intramedullary screw  SURGEON:  Nadara Mustard, MD  PHYSICIAN ASSISTANT:None ANESTHESIA:   General  PREOPERATIVE INDICATIONS:  Zoe Bailey is a  57 y.o. female with a diagnosis of Fibrous Non-Union Left Foot Fusion who failed conservative measures and elected for surgical management.    The risks benefits and alternatives were discussed with the patient preoperatively including but not limited to the risks of infection, bleeding, nerve injury, cardiopulmonary complications, the need for revision surgery, among others, and the patient was willing to proceed.  OPERATIVE IMPLANTS:   Implant Name Type Inv. Item Serial No. Manufacturer Lot No. LRB No. Used Action  PUTTY DBM STAGRAFT PLUS 5CC - [id number] Putty PUTTY DBM STAGRAFT PLUS 5CC  [employer](ORTH,TRAU,BIO,SG) [id number] Left 1 Implanted  SCREW NONLOCK ST 3.5X28 - [id number] Screw SCREW NONLOCK ST 3.5X28  [employer](ORTH,TRAU,BIO,SG) [id number] Left 1 Implanted  SCREW NL LP STRATUM 3.5X30 - [id number] Screw SCREW NL LP STRATUM 3.5X30  [employer](ORTH,TRAU,BIO,SG) [id number] Left 1 Implanted  SCREW NL LP STRATUM 3.5X30 - [id number] Screw SCREW NL LP STRATUM 3.5X30  [employer](ORTH,TRAU,BIO,SG) [id number] Left 1 Implanted  SCREW NONLOCK ST 3.5X28 - [id number] Screw SCREW NONLOCK ST 3.5X28  [employer](ORTH,TRAU,BIO,SG) [id number] Left 1 Implanted  PLATE TN UNIV STRM STD - [id number] Plate PLATE TN UNIV STRM STD  [employer](ORTH,TRAU,BIO,SG) [id number] Left 1 Implanted  SCREW CANN HDLS SHRT 6.5X75 - [id number] Screw SCREW CANN HDLS SHRT 6.5X75  [employer](ORTH,TRAU,BIO,SG)  Left 2 Implanted    @ENCIMAGES @  OPERATIVE  FINDINGS: C-arm possibly verified reduction of the talonavicular joint and subtalar joint.  OPERATIVE PROCEDURE: Patient was brought the operating room after undergoing a regional anesthetic.  After adequate levels anesthesia were obtained patient's left lower extremity was prepped using DuraPrep draped into a sterile field a timeout was called.  The previous sinus Tarsi incision was used this was carried down to the subtalar joint.  There was a fibrous nonunion of the subtalar joint.  Retractors were placed to protect the peroneal tendons which were visualized.  The Rampey bur and curette were used to debride the subtalar joint back to bleeding viable subchondral bone.  The incision was made over the heel guidewire was inserted into the subtalar fusion screw and the screw was removed.  Attention was then focused on the talonavicular joint.  The dorsal talonavicular joint was used and this was carried down to the interval of the anterior tibial tendon and EHL tendon.  The tendons were protected and visualized.  The retained staple was removed.  The talonavicular joint was debrided of fibrous nonunion bone this was irrigated this was debrided back to healthy viable subchondral bone.  The bone graft was placed in the talonavicular joint.  The joint was reduced and compressed.  A dorsal fusion plate was applied with 4 points of fixation proximally and 2 points of fixation distally.  C-arm possibly verified reduction.  The wounds were irrigated with normal saline.  The skin incisions were closed using 2-0 nylon a sterile dressing was applied patient was taken the PACU in stable condition.   DISCHARGE PLANNING:  Antibiotic duration: Preoperative antibiotics  Weightbearing: Touchdown weightbearing on the left  Pain medication: Prescription for  Vicodin requested  Dressing care/ Wound VAC: Dry dressing reinforce as needed  Ambulatory devices: Crutches  Discharge to: Home.  Follow-up: In the office 1 week  post operative.

## 2022-10-28 NOTE — Anesthesia Preprocedure Evaluation (Addendum)
Anesthesia Evaluation  Patient identified by MRN, date of birth, ID band Patient awake    Reviewed: Allergy & Precautions, NPO status , Patient's Chart, lab work & pertinent test results, reviewed documented beta blocker date and time   History of Anesthesia Complications Negative for: history of anesthetic complications  Airway Mallampati: II  TM Distance: >3 FB Neck ROM: Full    Dental  (+) Teeth Intact, Dental Advisory Given   Pulmonary shortness of breath, COPD, Current Smoker   breath sounds clear to auscultation       Cardiovascular hypertension, Pt. on medications and Pt. on home beta blockers (-) angina (-) Past MI and (-) CHF  Rhythm:Regular     Neuro/Psych  Headaches PSYCHIATRIC DISORDERS Anxiety Depression       GI/Hepatic Neg liver ROS,GERD  ,,  Endo/Other  negative endocrine ROS    Renal/GU negative Renal ROS     Musculoskeletal  (+) Arthritis ,   Fibrous Non-Union Left Foot Fusion   Abdominal   Peds  Hematology negative hematology ROS (+) Lab Results      Component                Value               Date                      WBC                      12.5 (H)            01/16/2022                HGB                      14.0                01/16/2022                HCT                      43.4                01/16/2022                MCV                      94.6                01/16/2022                PLT                      263                 01/16/2022              Anesthesia Other Findings   Reproductive/Obstetrics                             Anesthesia Physical Anesthesia Plan  ASA: 2  Anesthesia Plan: MAC and Regional   Post-op Pain Management: Regional block* and Toradol IV (intra-op)*   Induction: Intravenous  PONV Risk Score and Plan: 1 and Ondansetron and Propofol infusion  Airway Management Planned: Nasal Cannula, Natural Airway and Simple Face  Mask  Additional Equipment: None  Intra-op  Plan:   Post-operative Plan:   Informed Consent: I have reviewed the patients History and Physical, chart, labs and discussed the procedure including the risks, benefits and alternatives for the proposed anesthesia with the patient or authorized representative who has indicated his/her understanding and acceptance.     Dental advisory given  Plan Discussed with: CRNA  Anesthesia Plan Comments:         Anesthesia Quick Evaluation

## 2022-10-29 ENCOUNTER — Encounter (HOSPITAL_COMMUNITY): Payer: Self-pay | Admitting: Orthopedic Surgery

## 2022-10-30 ENCOUNTER — Other Ambulatory Visit: Payer: Self-pay | Admitting: Family Medicine

## 2022-10-30 ENCOUNTER — Other Ambulatory Visit (HOSPITAL_COMMUNITY): Payer: Self-pay

## 2022-10-30 MED ORDER — DICLOFENAC SODIUM 75 MG PO TBEC
75.0000 mg | DELAYED_RELEASE_TABLET | Freq: Every day | ORAL | 2 refills | Status: DC | PRN
  Filled 2022-10-30: qty 30, 30d supply, fill #0
  Filled 2022-12-02: qty 30, 30d supply, fill #1
  Filled 2023-01-13: qty 30, 30d supply, fill #2

## 2022-11-03 ENCOUNTER — Encounter (HOSPITAL_COMMUNITY): Payer: Self-pay | Admitting: Orthopedic Surgery

## 2022-11-03 MED ORDER — BUPIVACAINE-EPINEPHRINE (PF) 0.5% -1:200000 IJ SOLN
INTRAMUSCULAR | Status: DC | PRN
  Administered 2022-10-28: 20 mL via PERINEURAL
  Administered 2022-10-28: 5 mL via PERINEURAL

## 2022-11-03 MED ORDER — LIDOCAINE-EPINEPHRINE (PF) 1.5 %-1:200000 IJ SOLN
INTRAMUSCULAR | Status: DC | PRN
  Administered 2022-10-28: 10 mL via PERINEURAL
  Administered 2022-10-28: 5 mL via PERINEURAL

## 2022-11-03 NOTE — Anesthesia Procedure Notes (Signed)
Anesthesia Regional Block: Popliteal block   Pre-Anesthetic Checklist: , timeout performed,  Correct Patient, Correct Site, Correct Laterality,  Correct Procedure, Correct Position, site marked,  Risks and benefits discussed,  Surgical consent,  Pre-op evaluation,  At surgeon's request and post-op pain management  Laterality: Left and Lower  Prep: chloraprep       Needles:  Injection technique: Single-shot      Needle Length: 9cm  Needle Gauge: 22     Additional Needles: Arrow StimuQuik ECHO Echogenic Stimulating PNB Needle  Procedures:,,,, ultrasound used (permanent image in chart),,    Narrative:  Start time: 10/28/2022 9:01 AM End time: 10/28/2022 9:05 AM Injection made incrementally with aspirations every 5 mL.  Performed by: Personally  Anesthesiologist: Val Eagle, MD

## 2022-11-03 NOTE — Anesthesia Procedure Notes (Signed)
Anesthesia Regional Block: Adductor canal block   Pre-Anesthetic Checklist: , timeout performed,  Correct Patient, Correct Site, Correct Laterality,  Correct Procedure, Correct Position, site marked,  Risks and benefits discussed,  Surgical consent,  Pre-op evaluation,  At surgeon's request and post-op pain management  Laterality: Left and Lower  Prep: chloraprep       Needles:  Injection technique: Single-shot      Needle Length: 9cm  Needle Gauge: 22     Additional Needles: Arrow StimuQuik ECHO Echogenic Stimulating PNB Needle  Procedures:,,,, ultrasound used (permanent image in chart),,    Narrative:  Start time: 10/28/2022 9:05 AM End time: 10/28/2022 9:08 AM Injection made incrementally with aspirations every 5 mL.  Performed by: Personally  Anesthesiologist: Val Eagle, MD

## 2022-11-03 NOTE — Anesthesia Postprocedure Evaluation (Signed)
Anesthesia Post Note  Patient: LISLE DINWIDDIE  Procedure(s) Performed: REVISION TALONAVICULAR FUSION AND SUBTALAR JOINT LEFT FOOT (Left: Foot)     Patient location during evaluation: PACU Anesthesia Type: Regional Level of consciousness: awake and alert Pain management: pain level controlled Vital Signs Assessment: post-procedure vital signs reviewed and stable Respiratory status: spontaneous breathing, nonlabored ventilation and respiratory function stable Cardiovascular status: stable and blood pressure returned to baseline Postop Assessment: no apparent nausea or vomiting Anesthetic complications: no   No notable events documented.  Last Vitals:  Vitals:   10/28/22 1130 10/28/22 1145  BP: (!) 118/99 117/70  Pulse: 80 72  Resp: 16 17  Temp:  36.6 C  SpO2: 90% 93%    Last Pain:  Vitals:   10/28/22 1145  TempSrc:   PainSc: 0-No pain                 Marylan Glore

## 2022-11-04 ENCOUNTER — Ambulatory Visit (INDEPENDENT_AMBULATORY_CARE_PROVIDER_SITE_OTHER): Payer: Commercial Managed Care - PPO | Admitting: Family

## 2022-11-04 ENCOUNTER — Encounter: Payer: Self-pay | Admitting: Family

## 2022-11-04 ENCOUNTER — Other Ambulatory Visit (HOSPITAL_COMMUNITY): Payer: Self-pay

## 2022-11-04 DIAGNOSIS — M76822 Posterior tibial tendinitis, left leg: Secondary | ICD-10-CM

## 2022-11-04 DIAGNOSIS — M25572 Pain in left ankle and joints of left foot: Secondary | ICD-10-CM

## 2022-11-04 MED ORDER — HYDROCODONE-ACETAMINOPHEN 5-325 MG PO TABS
1.0000 | ORAL_TABLET | Freq: Four times a day (QID) | ORAL | 0 refills | Status: DC | PRN
Start: 2022-11-04 — End: 2022-12-23
  Filled 2022-11-04: qty 30, 8d supply, fill #0

## 2022-11-04 NOTE — Progress Notes (Signed)
Post-Op Visit Note   Patient: Zoe Bailey           Date of Birth: [birth year]           MRN: [id number] Visit Date: 11/04/2022 PCP: Kerri Perches, MD  Chief Complaint:  Chief Complaint  Patient presents with   Left Foot - Routine Post Op    10/28/22 revision talonavicular fusion and subtalar joint     HPI:  HPI The patient is a 57 year old woman who is seen status post revision talonavicular and subtalar fusion left foot  Ortho Exam On examination of the left foot the incisions are well-approximated with sutures there is no gaping no drainage no erythema minimal edema  Visit Diagnoses: No diagnosis found.  Plan: Begin daily Dial soap cleansing.  Dry dressings.  Remain nonweightbearing.  Follow-up in 2 weeks with radiographs of the foot.  Plan to harvest sutures.  Follow-Up Instructions: No follow-ups on file.   Imaging: No results found.  Orders:  No orders of the defined types were placed in this encounter.  No orders of the defined types were placed in this encounter.    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   Nicotine dependence in remission 06/12/2022   Posterior tibial tendinitis, left leg 01/16/2022   Chronic pain 01/09/2022   Vitamin D deficiency 10/28/2021   Annual visit for general adult medical examination with abnormal findings 09/16/2021   Back pain 09/16/2021   Allergic rhinitis 09/16/2021   Prediabetes 10/07/2019   Flat feet, bilateral 07/05/2019   Other chronic pain 06/21/2019   Falls frequently 06/21/2019   Cyst of knee joint 06/21/2019   GERD (gastroesophageal reflux disease) 01/04/2019   Hoarseness, chronic 12/22/2018   Sigmoid diverticulosis 08/11/2017   Essential hypertension 02/28/2015   Joint pain 02/28/2015   Easy bruising 02/19/2014   GAD (generalized anxiety disorder) 01/04/2013   CMC arthritis, thumb, degenerative 04/02/2011   TFCC (triangular  fibrocartilage complex) injury 04/02/2011   Overweight (BMI 25.0-29.9) 10/22/2009   Depression with anxiety 10/22/2009   NICOTINE ADDICTION 10/09/2008   Migraine 02/24/2007   Past Medical History:  Diagnosis Date   Anxiety    Arthritis    Cancer (HCC) 2010 approx   skin cancer   Complication of anesthesia    Wakes up during all surgeries   COPD (chronic obstructive pulmonary disease) (HCC)    Depression    Dysphagia 12/22/2018   Foot pain, right 03/2014   Hemorrhoid 08/11/2017   Hypertension    Menometrorrhagia 07/14/2013   Migraine    USES BOTOX FOR TREATMENT    Oral phase dysphagia 01/24/2019   Added automatically from request for surgery [id number]   Polymenorrhea 07/11/2013   Started two cycles per month since 03/2013   Shortness of breath dyspnea    Toenail fungus     Family History  Problem Relation Age of Onset   Arthritis Other    Cancer Other     Past Surgical History:  Procedure Laterality Date   ANKLE FRACTURE SURGERY Right    BREAST ENHANCEMENT SURGERY     CARPAL TUNNEL RELEASE Right    x2   CHONDROPLASTY Right 02/17/2018   Procedure: CHONDROPLASTY;  Surgeon: Durene Romans, MD;  Location: [facility];  Service: Orthopedics;  Laterality: Right;   COLONOSCOPY WITH PROPOFOL N/A 05/07/2017   Procedure: COLONOSCOPY WITH PROPOFOL;  Surgeon: Malissa Hippo, MD;  [facility];  Service:  Endoscopy;  Laterality: N/A;  8:30   ESOPHAGOGASTRODUODENOSCOPY (EGD) WITH PROPOFOL N/A 03/06/2019   Procedure: ESOPHAGOGASTRODUODENOSCOPY (EGD) WITH PROPOFOL;  Surgeon: Malissa Hippo, MD;  [facility];  Service: Endoscopy;  Laterality: N/A;   FOOT ARTHRODESIS Left 01/16/2022   Procedure: FUSION OF TALONAVICULAR AND SUBTALAR JOINT LEFT FOOT;  Surgeon: Nadara Mustard, MD;  Location: [facility] OR;  Service: Orthopedics;  Laterality: Left;   FOOT ARTHRODESIS Left 10/28/2022   Procedure: REVISION TALONAVICULAR FUSION AND SUBTALAR JOINT LEFT FOOT;  Surgeon: Nadara Mustard,  MD;  Location: [facility] OR;  Service: Orthopedics;  Laterality: Left;   HERNIA REPAIR     umbilical   KNEE ARTHROSCOPY WITH MEDIAL MENISECTOMY Right 02/17/2018   Procedure: KNEE ARTHROSCOPY WITH MEDIAL MENISECTOMY;  Surgeon: Durene Romans, MD;  Location: [facility];  Service: Orthopedics;  Laterality: Right;   MALONEY DILATION  03/06/2019   Procedure: MALONEY DILATION;  Surgeon: Malissa Hippo, MD;  [facility];  Service: Endoscopy;;   NASAL POLYP SURGERY     POLYPECTOMY     WRIST FUSION Right    Social History   Occupational History   Occupation: Estate agent for Chief Operating Officer: [employer]  Tobacco Use   Smoking status: Every Day    Current packs/day: 0.38    Average packs/day: 0.4 packs/day for 33.0 years (12.5 ttl pk-yrs)    Types: Cigarettes   Smokeless tobacco: Never  Vaping Use   Vaping status: Never Used  Substance and Sexual Activity   Alcohol use: No    Alcohol/week: 0.0 standard drinks of alcohol   Drug use: No   Sexual activity: Yes    Birth control/protection: None

## 2022-11-12 ENCOUNTER — Other Ambulatory Visit: Payer: Self-pay

## 2022-11-12 ENCOUNTER — Ambulatory Visit (INDEPENDENT_AMBULATORY_CARE_PROVIDER_SITE_OTHER): Payer: Commercial Managed Care - PPO | Admitting: Obstetrics & Gynecology

## 2022-11-12 ENCOUNTER — Encounter: Payer: Self-pay | Admitting: Pharmacist

## 2022-11-12 ENCOUNTER — Other Ambulatory Visit (HOSPITAL_COMMUNITY)
Admission: RE | Admit: 2022-11-12 | Discharge: 2022-11-12 | Disposition: A | Discharge status: Home / Self Care | Payer: Commercial Managed Care - PPO | Source: Ambulatory Visit | Attending: Obstetrics & Gynecology | Admitting: Obstetrics & Gynecology

## 2022-11-12 ENCOUNTER — Encounter: Payer: Self-pay | Admitting: Obstetrics & Gynecology

## 2022-11-12 ENCOUNTER — Other Ambulatory Visit (HOSPITAL_COMMUNITY): Payer: Self-pay

## 2022-11-12 VITALS — BP 124/76 | HR 99

## 2022-11-12 DIAGNOSIS — R87610 Atypical squamous cells of undetermined significance on cytologic smear of cervix (ASC-US): Secondary | ICD-10-CM

## 2022-11-12 DIAGNOSIS — N760 Acute vaginitis: Secondary | ICD-10-CM

## 2022-11-12 DIAGNOSIS — Z720 Tobacco use: Secondary | ICD-10-CM

## 2022-11-12 DIAGNOSIS — R87810 Cervical high risk human papillomavirus (HPV) DNA test positive: Secondary | ICD-10-CM | POA: Diagnosis not present

## 2022-11-12 DIAGNOSIS — C539 Malignant neoplasm of cervix uteri, unspecified: Secondary | ICD-10-CM

## 2022-11-12 HISTORY — DX: Malignant neoplasm of cervix uteri, unspecified: C53.9

## 2022-11-12 MED ORDER — FLUCONAZOLE 150 MG PO TABS
150.0000 mg | ORAL_TABLET | Freq: Once | ORAL | 0 refills | Status: DC
  Filled 2022-11-12: qty 1, 1d supply, fill #0

## 2022-11-12 MED ORDER — FLUCONAZOLE 150 MG PO TABS
150.0000 mg | ORAL_TABLET | Freq: Once | ORAL | 2 refills | Status: AC
Start: 2022-11-12 — End: 2023-01-15
  Filled 2022-11-12: qty 1, 1d supply, fill #0
  Filled 2023-01-13: qty 1, 1d supply, fill #1

## 2022-11-12 NOTE — Progress Notes (Signed)
Patient name: Zoe Bailey MRN [id number]  Date of birth: 03-Oct-1965 Chief Complaint:   Colposcopy  History of Present Illness:   Zoe Bailey is a 57 y.o. G1P1 female being seen today for cervical dysplasia management.  Smoker:  Yes.   New sexual partner:  No.   Prior cytology:  Date Result Procedure  2024 ASCUS w/ HRHPV positive: other (not 16, 18/45) Colposcopy: to be completed today  2023 ASCUS w/ HRHPV positive: other (not 16, 18/45) Colposcopy: CIN 1   Patient's last menstrual period was 04/09/2017 (approximate).     10/20/2022    2:12 PM 06/12/2022    1:55 PM 04/17/2022    1:40 PM 01/09/2022    4:47 PM 01/09/2022    4:30 PM  Depression screen PHQ 2/9  Decreased Interest 1 0 1 3 3   Down, Depressed, Hopeless 1 1 1 2 2   PHQ - 2 Score 2 1 2 5 5   Altered sleeping 2 1 1 3 3   Tired, decreased energy 2 1 1 2 2   Change in appetite 2 0 1 2 2   Feeling bad or failure about yourself  1 0 0 2 2  Trouble concentrating 0 0 0 0 0  Moving slowly or fidgety/restless 0 0 0 1 1  Suicidal thoughts 0 0 0 0 0  PHQ-9 Score 9 3 5 15 15   Difficult doing work/chores  Not difficult at all Somewhat difficult Extremely dIfficult Extremely dIfficult     Review of Systems:   Pertinent items are noted in HPI Denies fever/chills, dizziness, headaches, visual disturbances, fatigue, shortness of breath, chest pain, abdominal pain, vomiting, no problems with bowel movements, urination, or intercourse unless otherwise stated above.  Pertinent History Reviewed:  Reviewed past medical,surgical, social, obstetrical and family history.  Reviewed problem list, medications and allergies. Physical Assessment:   Vitals:   11/12/22 0835  BP: 124/76  Pulse: 99  There is no height or weight on file to calculate BMI.       Physical Examination:   General appearance: alert, well appearing, and in no distress  Psych: mood appropriate, normal affect  Skin: warm & dry   Cardiovascular: normal heart rate  noted  Respiratory: normal respiratory effort, no distress  Abdomen: soft, non-tender   Pelvic: VULVA: normal appearing vulva with no masses, tenderness or lesions, VAGINA: normal appearing vagina with normal color and discharge, no lesions, CERVIX: see colposcopy section  Extremities: recent surgery on left foot-in ACE bandage and boot  Chaperone: Faith Rogue     Colposcopy Procedure Note  Indications: ASCUS, HPV+    Procedure Details  The risks and benefits of the procedure and Written informed consent obtained.  Speculum placed in vagina and excellent visualization of cervix achieved, cervix swabbed x 3 with acetic acid solution.  Findings: TMZ zone visualized though concern for possible inadequate colposcopy as suspect TMZ may be within endocervical canal.  Cervix: no visible lesions; ECC and cervical biopsies obtained.    Adequate hemostasis noted  Specimens: ECC and cervical biopsy  Complications: none.  Colposcopic Impression: CIN 1   Plan(Based on 2019 ASCCP recommendations) -Cervical dysplasia- ASCUS,HPV+ pt previously had colposcopy and well aware of indication and procedure -Discussed ASCCP guidelines and current recommendations for colposcopy -As above, inform consent obtained and procedure completed -biopsies obtained, further management pending results -Discussed with patient that showed low-grade dysplasia continue or concern for inadequate colposcopy, neck step would be to proceed with excisional procedure -Questions and concerns were addressed  -  Acute vaginitis Rx sent in for Diflucan Would encourage patient to return for nurse visit should symptoms return  Myna Hidalgo, DO Attending Obstetrician & Gynecologist, [facility] for [facility], [facility]

## 2022-11-13 ENCOUNTER — Other Ambulatory Visit: Payer: Self-pay

## 2022-11-13 ENCOUNTER — Other Ambulatory Visit (HOSPITAL_COMMUNITY): Payer: Self-pay

## 2022-11-13 ENCOUNTER — Encounter: Payer: Self-pay | Admitting: Family Medicine

## 2022-11-13 ENCOUNTER — Ambulatory Visit (INDEPENDENT_AMBULATORY_CARE_PROVIDER_SITE_OTHER): Payer: Commercial Managed Care - PPO | Admitting: Family Medicine

## 2022-11-13 VITALS — BP 118/81 | HR 93 | Ht 61.0 in | Wt 154.0 lb

## 2022-11-13 DIAGNOSIS — E559 Vitamin D deficiency, unspecified: Secondary | ICD-10-CM | POA: Diagnosis not present

## 2022-11-13 DIAGNOSIS — F17200 Nicotine dependence, unspecified, uncomplicated: Secondary | ICD-10-CM | POA: Diagnosis not present

## 2022-11-13 DIAGNOSIS — Z13220 Encounter for screening for lipoid disorders: Secondary | ICD-10-CM

## 2022-11-13 DIAGNOSIS — E663 Overweight: Secondary | ICD-10-CM | POA: Diagnosis not present

## 2022-11-13 DIAGNOSIS — Z0001 Encounter for general adult medical examination with abnormal findings: Secondary | ICD-10-CM

## 2022-11-13 DIAGNOSIS — Z23 Encounter for immunization: Secondary | ICD-10-CM | POA: Diagnosis not present

## 2022-11-13 DIAGNOSIS — I10 Essential (primary) hypertension: Secondary | ICD-10-CM

## 2022-11-13 LAB — SURGICAL PATHOLOGY

## 2022-11-13 MED ORDER — BUSPIRONE HCL 10 MG PO TABS
10.0000 mg | ORAL_TABLET | Freq: Every day | ORAL | 1 refills | Status: AC | PRN
  Filled 2022-11-13: qty 90, 90d supply, fill #0
  Filled 2023-04-21: qty 90, 90d supply, fill #1

## 2022-11-13 MED ORDER — DULOXETINE HCL 60 MG PO CPEP
60.0000 mg | ORAL_CAPSULE | Freq: Every day | ORAL | 1 refills | Status: DC
  Filled 2022-11-13 – 2023-02-08 (×2): qty 90, 90d supply, fill #0
  Filled 2023-04-21: qty 90, 90d supply, fill #1

## 2022-11-13 MED ORDER — GABAPENTIN 400 MG PO CAPS
400.0000 mg | ORAL_CAPSULE | Freq: Four times a day (QID) | ORAL | 5 refills | Status: DC
  Filled 2022-11-13 – 2022-12-23 (×2): qty 120, 30d supply, fill #0
  Filled 2023-02-08: qty 120, 30d supply, fill #1
  Filled 2023-03-15: qty 120, 30d supply, fill #0
  Filled 2023-04-21: qty 120, 30d supply, fill #1
  Filled 2023-07-06: qty 120, 30d supply, fill #2

## 2022-11-13 MED ORDER — PHENTERMINE HCL 37.5 MG PO TABS
37.5000 mg | ORAL_TABLET | Freq: Every day | ORAL | 2 refills | Status: DC
Start: 2022-11-13 — End: 2023-08-13
  Filled 2022-11-13: qty 30, 30d supply, fill #0
  Filled 2023-01-05: qty 30, 30d supply, fill #1
  Filled 2023-03-15: qty 30, 30d supply, fill #0

## 2022-11-13 NOTE — Patient Instructions (Addendum)
F/U in 12 weeks, call if you need me sooner  Flu vaccine today   Please get covid vaccine in next 2 to 3 weeks  Continue to work on smoking cessation   Thankful you have had surgery, and look forward to continued healing  New for weight loss is phentermine, bUT mUST remove the sugar and sweets, ice cream `cake, cookies etc have fun making great home made smoothies  Fasting lipid, cmp and EGFR,tSH, Vit d next week  Thanks for choosing [facility], we consider it a privelige to serve you.

## 2022-11-15 ENCOUNTER — Encounter: Payer: Self-pay | Admitting: Family Medicine

## 2022-11-15 NOTE — Assessment & Plan Note (Signed)

## 2022-11-15 NOTE — Assessment & Plan Note (Signed)
  Patient re-educated about  the importance of commitment to a  minimum of 150 minutes of exercise per week as able.  The importance of healthy food choices with portion control discussed, as well as eating regularly and within a 12 hour window most days. The need to choose "clean , green" food 50 to 75% of the time is discussed, as well as to make water the primary drink and set a goal of 64 ounces water daily.       11/13/2022    1:31 PM 10/28/2022    7:49 AM 10/26/2022    1:49 PM  Weight /BMI  Weight 154 lb 150 lb 151 lb  Height 5\' 1"  (1.549 m) 5\' 1"  (1.549 m) 5\' 1"  (1.549 m)  BMI 29.1 kg/m2 28.34 kg/m2 28.53 kg/m2   Deteriorated, resume phentermine and reduce sweet and carb intake

## 2022-11-15 NOTE — Assessment & Plan Note (Signed)
Asked:confirms currently smokes cigarettes °Assess: Unwilling to set a quit date, but is cutting back °Advise: needs to QUIT to reduce risk of cancer, cardio and cerebrovascular disease °Assist: counseled for 5 minutes and literature provided °Arrange: follow up in 2 to 4 months ° °

## 2022-11-15 NOTE — Progress Notes (Signed)
Zoe Bailey     MRN: [id number]      DOB: Dec 20, 1965  Chief Complaint  Patient presents with   Annual Exam    R-100 L-25 B-25 Pain in Rt shoulder and left hand from fall  Pt. Request phentermine   Immunizations    Flu given 11/13/22 Pt. Request shingles as well as pneumonia vaccination today.     HPI: Patient is in for annual physical exam.c/o weight gain off wegovy, does say she has been eating a lot of ice cream,wants to resume phentermine Trying to quit smoking Excellent mental health Immunization is reviewed , and  updated if needed. Labs to be updated   PE: BP 118/81 (BP Location: Right Arm, Patient Position: Sitting, Cuff Size: Large)   Pulse 93   Ht 5\' 1"  (1.549 m)   Wt 154 lb (69.9 kg)   LMP 04/09/2017 (Approximate)   SpO2 92%   BMI 29.10 kg/m   Pleasant  female, alert and oriented x 3, in no cardio-pulmonary distress. Afebrile. HEENT No facial trauma or asymetry. Sinuses non tender.  Extra occullar muscles intact.. External ears normal, . Neck: supple, no adenopathy,JVD or thyromegaly.No bruits.  Chest: Clear to ascultation bilaterally.Decreased air entry. No crackles or wheezes.   Cardiovascular system; Heart sounds normal,  S1 and  S2 ,no S3.  No murmur, or thrill.  Peripheral pulses normal.  Abdomen: Soft, non tender     Musculoskeletal exam: Adequate though reduced  ROM of spine, hips , shoulders and knees. Left foot in boot due to recent ankle surgery  Neurologic: Cranial nerves 2 to 12 intact. Power, tone ,sensation and reflexes normal throughout. No disturbance in gait. No tremor.  Skin: Intact, no ulceration, erythema , scaling or rash noted. Pigmentation normal throughout  Psych; Normal mood and affect. Judgement and concentration normal   Assessment & Plan:  Annual visit for general adult medical examination with abnormal findings Annual exam as documented. Counseling done  re healthy lifestyle involving commitment to  150 minutes exercise per week, heart healthy diet, and attaining healthy weight.The importance of adequate sleep also discussed. Regular seat belt use and home safety, is also discussed. Changes in health habits are decided on by the patient with goals and time frames  set for achieving them. Immunization and cancer screening needs are specifically addressed at this visit.   NICOTINE ADDICTION Asked:confirms currently smokes cigarettes Assess: Unwilling to set a quit date, but is cutting back Advise: needs to QUIT to reduce risk of cancer, cardio and cerebrovascular disease Assist: counseled for 5 minutes and literature provided Arrange: follow up in 2 to 4 months   Overweight (BMI 25.0-29.9)  Patient re-educated about  the importance of commitment to a  minimum of 150 minutes of exercise per week as able.  The importance of healthy food choices with portion control discussed, as well as eating regularly and within a 12 hour window most days. The need to choose "clean , green" food 50 to 75% of the time is discussed, as well as to make water the primary drink and set a goal of 64 ounces water daily.       11/13/2022    1:31 PM 10/28/2022    7:49 AM 10/26/2022    1:49 PM  Weight /BMI  Weight 154 lb 150 lb 151 lb  Height 5\' 1"  (1.549 m) 5\' 1"  (1.549 m) 5\' 1"  (1.549 m)  BMI 29.1 kg/m2 28.34 kg/m2 28.53 kg/m2   Deteriorated, resume phentermine and  reduce sweet and carb intake

## 2022-11-16 ENCOUNTER — Other Ambulatory Visit: Payer: Self-pay

## 2022-11-16 ENCOUNTER — Other Ambulatory Visit (HOSPITAL_COMMUNITY): Payer: Self-pay

## 2022-11-18 ENCOUNTER — Ambulatory Visit: Payer: Commercial Managed Care - PPO | Admitting: Family

## 2022-11-18 ENCOUNTER — Encounter: Payer: Self-pay | Admitting: Family

## 2022-11-18 ENCOUNTER — Other Ambulatory Visit (INDEPENDENT_AMBULATORY_CARE_PROVIDER_SITE_OTHER): Payer: Commercial Managed Care - PPO

## 2022-11-18 DIAGNOSIS — M76822 Posterior tibial tendinitis, left leg: Secondary | ICD-10-CM

## 2022-11-18 DIAGNOSIS — M25572 Pain in left ankle and joints of left foot: Secondary | ICD-10-CM | POA: Diagnosis not present

## 2022-11-18 NOTE — Progress Notes (Signed)
Post-Op Visit Note   Patient: Zoe Bailey           Date of Birth: [birth year]           MRN: [id number] Visit Date: 11/18/2022 PCP: Kerri Perches, MD  Chief Complaint:  Chief Complaint  Patient presents with   Left Foot - Routine Post Op    10/28/22 revision talonavicular fusion and subtalar joint     HPI:  HPI The patient is a 57 year old woman seen status post revision talonavicular and subtalar fusion August 21 she has been nonweightbearing with a cam walker Ortho Exam On examination the left foot and ankle there is no edema no erythema her sutures are in place her incisions are well-healed sutures harvested today without incident  Visit Diagnoses:  1. Pain in left ankle and joints of left foot     Plan: Continue daily dose of cleansing dry dressings nonweightbearing.  Continue cam walker.  Radiographs left foot at follow-up  Follow-Up Instructions: No follow-ups on file.   Imaging: No results found.  Orders:  Orders Placed This Encounter  Procedures   XR Foot Complete Left   No orders of the defined types were placed in this encounter.    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   Nicotine dependence in remission 06/12/2022   Posterior tibial tendinitis, left leg 01/16/2022   Chronic pain 01/09/2022   Vitamin D deficiency 10/28/2021   Annual visit for general adult medical examination with abnormal findings 09/16/2021   Back pain 09/16/2021   Allergic rhinitis 09/16/2021   Prediabetes 10/07/2019   Flat feet, bilateral 07/05/2019   Other chronic pain 06/21/2019   Falls frequently 06/21/2019   Cyst of knee joint 06/21/2019   GERD (gastroesophageal reflux disease) 01/04/2019   Hoarseness, chronic 12/22/2018   Sigmoid diverticulosis 08/11/2017   Essential hypertension 02/28/2015   Joint pain 02/28/2015   Easy bruising 02/19/2014   GAD (generalized anxiety disorder) 01/04/2013    CMC arthritis, thumb, degenerative 04/02/2011   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/02/2011   Overweight (BMI 25.0-29.9) 10/22/2009   Depression with anxiety 10/22/2009   NICOTINE ADDICTION 10/09/2008   Migraine 02/24/2007   Past Medical History:  Diagnosis Date   Anxiety    Arthritis    Cancer (HCC) 2010 approx   skin cancer   Cervical cancer (HCC) 11/12/2022   Complication of anesthesia    Wakes up during all surgeries   COPD (chronic obstructive pulmonary disease) (HCC)    Depression    Dysphagia 12/22/2018   Foot pain, right 03/2014   Hemorrhoid 08/11/2017   Hypertension    Menometrorrhagia 07/14/2013   Migraine    USES BOTOX FOR TREATMENT    Oral phase dysphagia 01/24/2019   Added automatically from request for surgery [id number]   Polymenorrhea 07/11/2013   Started two cycles per month since 03/2013   Shortness of breath dyspnea    Toenail fungus     Family History  Problem Relation Age of Onset   Arthritis Other    Cancer Other     Past Surgical History:  Procedure Laterality Date   ANKLE FRACTURE SURGERY Right    BREAST ENHANCEMENT SURGERY     CARPAL TUNNEL RELEASE Right    x2   CHONDROPLASTY Right 02/17/2018   Procedure: CHONDROPLASTY;  Surgeon: Durene Romans, MD;  Location: [facility];  Service: Orthopedics;  Laterality: Right;   COLONOSCOPY WITH PROPOFOL  N/A 05/07/2017   Procedure: COLONOSCOPY WITH PROPOFOL;  Surgeon: Malissa Hippo, MD;  [facility];  Service: Endoscopy;  Laterality: N/A;  8:30   ESOPHAGOGASTRODUODENOSCOPY (EGD) WITH PROPOFOL N/A 03/06/2019   Procedure: ESOPHAGOGASTRODUODENOSCOPY (EGD) WITH PROPOFOL;  Surgeon: Malissa Hippo, MD;  [facility];  Service: Endoscopy;  Laterality: N/A;   FOOT ARTHRODESIS Left 01/16/2022   Procedure: FUSION OF TALONAVICULAR AND SUBTALAR JOINT LEFT FOOT;  Surgeon: Nadara Mustard, MD;  Location: [facility] OR;  Service: Orthopedics;  Laterality: Left;   FOOT ARTHRODESIS Left  10/28/2022   Procedure: REVISION TALONAVICULAR FUSION AND SUBTALAR JOINT LEFT FOOT;  Surgeon: Nadara Mustard, MD;  Location: [facility];  Service: Orthopedics;  Laterality: Left;   HERNIA REPAIR     umbilical   KNEE ARTHROSCOPY WITH MEDIAL MENISECTOMY Right 02/17/2018   Procedure: KNEE ARTHROSCOPY WITH MEDIAL MENISECTOMY;  Surgeon: Durene Romans, MD;  [facility]: [facility];  Service: Orthopedics;  Laterality: Right;   MALONEY DILATION  03/06/2019   Procedure: MALONEY DILATION;  Surgeon: Malissa Hippo, MD;  [facility];  Service: Endoscopy;;   NASAL POLYP SURGERY     POLYPECTOMY     WRIST FUSION Right    Social History   Occupational History   Occupation: Estate agent for Chief Operating Officer: [employer]  Tobacco Use   Smoking status: Every Day    Current packs/day: 0.38    Average packs/day: 0.4 packs/day for 33.0 years (12.5 ttl pk-yrs)    Types: Cigarettes   Smokeless tobacco: Never  Vaping Use   Vaping status: Never Used  Substance and Sexual Activity   Alcohol use: No    Alcohol/week: 0.0 standard drinks of alcohol   Drug use: No   Sexual activity: Not Currently    Birth control/protection: None

## 2022-11-19 DIAGNOSIS — H35363 Drusen (degenerative) of macula, bilateral: Secondary | ICD-10-CM | POA: Diagnosis not present

## 2022-11-19 DIAGNOSIS — H524 Presbyopia: Secondary | ICD-10-CM | POA: Diagnosis not present

## 2022-11-28 ENCOUNTER — Encounter (HOSPITAL_COMMUNITY): Payer: Self-pay

## 2022-12-02 ENCOUNTER — Encounter: Payer: Self-pay | Admitting: Family

## 2022-12-02 ENCOUNTER — Other Ambulatory Visit: Payer: Self-pay

## 2022-12-02 ENCOUNTER — Other Ambulatory Visit (HOSPITAL_COMMUNITY): Payer: Self-pay

## 2022-12-02 ENCOUNTER — Ambulatory Visit (INDEPENDENT_AMBULATORY_CARE_PROVIDER_SITE_OTHER): Payer: Commercial Managed Care - PPO | Admitting: Family

## 2022-12-02 ENCOUNTER — Other Ambulatory Visit (INDEPENDENT_AMBULATORY_CARE_PROVIDER_SITE_OTHER): Payer: Self-pay

## 2022-12-02 ENCOUNTER — Other Ambulatory Visit: Payer: Self-pay | Admitting: Family Medicine

## 2022-12-02 DIAGNOSIS — M25572 Pain in left ankle and joints of left foot: Secondary | ICD-10-CM

## 2022-12-02 MED ORDER — CYCLOBENZAPRINE HCL 10 MG PO TABS
10.0000 mg | ORAL_TABLET | Freq: Three times a day (TID) | ORAL | 2 refills | Status: DC
  Filled 2022-12-02: qty 90, 30d supply, fill #0
  Filled 2023-01-13: qty 90, 30d supply, fill #1
  Filled 2023-04-21: qty 90, 30d supply, fill #2

## 2022-12-02 NOTE — Progress Notes (Signed)
Post-Op Visit Note   Patient: Zoe Bailey           Date of Birth: 09/07/1965           MRN: [id number] Visit Date: 12/02/2022 PCP: Kerri Perches, MD  Chief Complaint:  Chief Complaint  Patient presents with   Left Foot - Routine Post Op    10/28/22 revision talonavicular fusion and subtalar joint     HPI:  HPI The patient is a 57 year old woman seen status post revision talonavicular and subtalar fusion August 21 she has been attempting ambulation with her cam walker she complains of some posterior heel Achilles area pain which waxes and wanes with weightbearing. Ortho Exam On examination the left foot and ankle there is no edema no erythema her sutures are in place her incisions are well-healed.  Visit Diagnoses:  1. Pain in left ankle and joints of left foot     Plan:  Recommended she resume nonweightbearing in her cam walker she may use her kneeling scooter or crutches she will follow-up in the office in 2 weeks we will reevaluate her weightbearing at that time Radiographs left foot at follow-up  Follow-Up Instructions: Return in about 15 days (around 12/17/2022).   Imaging: No results found.  Orders:  Orders Placed This Encounter  Procedures   XR Foot Complete Left   No orders of the defined types were placed in this encounter.    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   Nicotine dependence in remission 06/12/2022   Posterior tibial tendinitis, left leg 01/16/2022   Chronic pain 01/09/2022   Vitamin D deficiency 10/28/2021   Annual visit for general adult medical examination with abnormal findings 09/16/2021   Back pain 09/16/2021   Allergic rhinitis 09/16/2021   Prediabetes 10/07/2019   Flat feet, bilateral 07/05/2019   Other chronic pain 06/21/2019   Falls frequently 06/21/2019   Cyst of knee joint 06/21/2019   GERD (gastroesophageal reflux disease) 01/04/2019   Hoarseness,  chronic 12/22/2018   Sigmoid diverticulosis 08/11/2017   Essential hypertension 02/28/2015   Joint pain 02/28/2015   Easy bruising 02/19/2014   GAD (generalized anxiety disorder) 01/04/2013   CMC arthritis, thumb, degenerative 04/02/2011   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/02/2011   Overweight (BMI 25.0-29.9) 10/22/2009   Depression with anxiety 10/22/2009   NICOTINE ADDICTION 10/09/2008   Migraine 02/24/2007   Past Medical History:  Diagnosis Date   Anxiety    Arthritis    Cancer (HCC) 2010 approx   skin cancer   Cervical cancer (HCC) 11/12/2022   Complication of anesthesia    Wakes up during all surgeries   COPD (chronic obstructive pulmonary disease) (HCC)    Depression    Dysphagia 12/22/2018   Foot pain, right 03/2014   Hemorrhoid 08/11/2017   Hypertension    Menometrorrhagia 07/14/2013   Migraine    USES BOTOX FOR TREATMENT    Oral phase dysphagia 01/24/2019   Added automatically from request for surgery [id number]   Polymenorrhea 07/11/2013   Started two cycles per month since 03/2013   Shortness of breath dyspnea    Toenail fungus     Family History  Problem Relation Age of Onset   Arthritis Other    Cancer Other     Past Surgical History:  Procedure Laterality Date   ANKLE FRACTURE SURGERY Right    BREAST ENHANCEMENT SURGERY     CARPAL TUNNEL  RELEASE Right    x2   CHONDROPLASTY Right 02/17/2018   Procedure: CHONDROPLASTY;  Surgeon: Durene Romans, MD;  Location: [facility];  Service: Orthopedics;  Laterality: Right;   COLONOSCOPY WITH PROPOFOL N/A 05/07/2017   Procedure: COLONOSCOPY WITH PROPOFOL;  Surgeon: Malissa Hippo, MD;  [facility];  Service: Endoscopy;  Laterality: N/A;  8:30   ESOPHAGOGASTRODUODENOSCOPY (EGD) WITH PROPOFOL N/A 03/06/2019   Procedure: ESOPHAGOGASTRODUODENOSCOPY (EGD) WITH PROPOFOL;  Surgeon: Malissa Hippo, MD;  [facility];  Service: Endoscopy;  Laterality: N/A;   FOOT ARTHRODESIS Left  01/16/2022   Procedure: FUSION OF TALONAVICULAR AND SUBTALAR JOINT LEFT FOOT;  Surgeon: Nadara Mustard, MD;  Location: [facility] OR;  Service: Orthopedics;  Laterality: Left;   FOOT ARTHRODESIS Left 10/28/2022   Procedure: REVISION TALONAVICULAR FUSION AND SUBTALAR JOINT LEFT FOOT;  Surgeon: Nadara Mustard, MD;  Location: [facility];  Service: Orthopedics;  Laterality: Left;   HERNIA REPAIR     umbilical   KNEE ARTHROSCOPY WITH MEDIAL MENISECTOMY Right 02/17/2018   Procedure: KNEE ARTHROSCOPY WITH MEDIAL MENISECTOMY;  Surgeon: Durene Romans, MD;  [facility]: [facility];  Service: Orthopedics;  Laterality: Right;   MALONEY DILATION  03/06/2019   Procedure: MALONEY DILATION;  Surgeon: Malissa Hippo, MD;  [facility];  Service: Endoscopy;;   NASAL POLYP SURGERY     POLYPECTOMY     WRIST FUSION Right    Social History   Occupational History   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

## 2022-12-23 ENCOUNTER — Other Ambulatory Visit (INDEPENDENT_AMBULATORY_CARE_PROVIDER_SITE_OTHER): Payer: Self-pay

## 2022-12-23 ENCOUNTER — Ambulatory Visit: Payer: Commercial Managed Care - PPO | Admitting: Family

## 2022-12-23 ENCOUNTER — Other Ambulatory Visit (HOSPITAL_COMMUNITY): Payer: Self-pay

## 2022-12-23 ENCOUNTER — Encounter: Payer: Self-pay | Admitting: Family

## 2022-12-23 DIAGNOSIS — M76822 Posterior tibial tendinitis, left leg: Secondary | ICD-10-CM

## 2022-12-23 DIAGNOSIS — M25572 Pain in left ankle and joints of left foot: Secondary | ICD-10-CM

## 2022-12-23 MED ORDER — HYDROCODONE-ACETAMINOPHEN 5-325 MG PO TABS
1.0000 | ORAL_TABLET | Freq: Three times a day (TID) | ORAL | 0 refills | Status: DC | PRN
  Filled 2022-12-23: qty 30, 10d supply, fill #0

## 2022-12-23 NOTE — Progress Notes (Signed)
Post-Op Visit Note   Patient: Zoe Bailey           Date of Birth: 08/03/1965           MRN: [id number] Visit Date: 12/23/2022 PCP: Kerri Perches, MD  Chief Complaint: No chief complaint on file.   HPI:  HPI The patient is a 57 year old woman who is seen status post revision subtalar and talonavicular fusion she has been full weightbearing in a cam walker she reports she has been having increased pain and swelling Ortho Exam On examination of the left ankle her incisions are well-healed there is mild to moderate edema there is some tenderness about the Achilles area.  The Achilles is intact  Visit Diagnoses:  1. 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 Pain in left ankle and joints of left foot     Plan: She will resume nonweightbearing.  Using her cam walker and her kneeling scooter.  Discussed possibility of using anti-inflammatories twice daily for her swelling and pain in addition with her pain medication Follow-Up Instructions: No follow-ups on file.   Imaging: No results found.  Orders:  Orders Placed This Encounter  Procedures   XR Foot Complete Left   No orders of the defined types were placed in this encounter.    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   Nicotine dependence in remission 06/12/2022   Posterior tibial tendinitis, left leg 01/16/2022   Chronic pain 01/09/2022   Vitamin D deficiency 10/28/2021   Annual visit for general adult medical examination with abnormal findings 09/16/2021   Back pain 09/16/2021   Allergic rhinitis 09/16/2021   Prediabetes 10/07/2019   Flat feet, bilateral 07/05/2019   Other chronic pain 06/21/2019   Falls frequently 06/21/2019   Cyst of knee joint 06/21/2019   GERD (gastroesophageal reflux disease) 01/04/2019   Hoarseness, chronic 12/22/2018   Sigmoid diverticulosis 08/11/2017   Essential hypertension  02/28/2015   Joint pain 02/28/2015   Easy bruising 02/19/2014   GAD (generalized anxiety disorder) 01/04/2013   Osteoarthritis of carpometacarpal joint of thumb 04/02/2011   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/02/2011   Overweight (BMI 25.0-29.9) 10/22/2009   Depression with anxiety 10/22/2009   NICOTINE ADDICTION 10/09/2008   Migraine 02/24/2007   Past Medical History:  Diagnosis Date   Anxiety    Arthritis    Cancer (HCC) 2010 approx   skin cancer   Cervical cancer (HCC) 11/12/2022   Complication of anesthesia    Wakes up during all surgeries   COPD (chronic obstructive pulmonary disease) (HCC)    Depression    Dysphagia 12/22/2018   Foot pain, right 03/2014   Hemorrhoid 08/11/2017   Hypertension    Menometrorrhagia 07/14/2013   Migraine    USES BOTOX FOR TREATMENT    Oral phase dysphagia 01/24/2019   Added automatically from request for surgery [id number]   Polymenorrhea 07/11/2013   Started two cycles per month since 03/2013   Shortness of breath dyspnea    Toenail fungus     Family History  Problem Relation Age of Onset   Arthritis Other    Cancer Other     Past Surgical History:  Procedure Laterality Date   ANKLE FRACTURE SURGERY Right    BREAST ENHANCEMENT SURGERY     CARPAL TUNNEL RELEASE Right    x2   CHONDROPLASTY Right 02/17/2018   Procedure: CHONDROPLASTY;  Surgeon:  Durene Romans, MD;  Location: [facility];  Service: Orthopedics;  Laterality: Right;   COLONOSCOPY WITH PROPOFOL N/A 05/07/2017   Procedure: COLONOSCOPY WITH PROPOFOL;  Surgeon: Malissa Hippo, MD;  [facility];  Service: Endoscopy;  Laterality: N/A;  8:30   ESOPHAGOGASTRODUODENOSCOPY (EGD) WITH PROPOFOL N/A 03/06/2019   Procedure: ESOPHAGOGASTRODUODENOSCOPY (EGD) WITH PROPOFOL;  Surgeon: Malissa Hippo, MD;  [facility];  Service: Endoscopy;  Laterality: N/A;   FOOT ARTHRODESIS Left 01/16/2022   Procedure: FUSION OF TALONAVICULAR AND SUBTALAR JOINT LEFT  FOOT;  Surgeon: Nadara Mustard, MD;  Location: [facility] OR;  Service: Orthopedics;  Laterality: Left;   FOOT ARTHRODESIS Left 10/28/2022   Procedure: REVISION TALONAVICULAR FUSION AND SUBTALAR JOINT LEFT FOOT;  Surgeon: Nadara Mustard, MD;  Location: [facility];  Service: Orthopedics;  Laterality: Left;   HERNIA REPAIR     umbilical   KNEE ARTHROSCOPY WITH MEDIAL MENISECTOMY Right 02/17/2018   Procedure: KNEE ARTHROSCOPY WITH MEDIAL MENISECTOMY;  Surgeon: Durene Romans, MD;  Location: [facility];  Service: Orthopedics;  Laterality: Right;   MALONEY DILATION  03/06/2019   Procedure: MALONEY DILATION;  Surgeon: Malissa Hippo, MD;  [facility];  Service: Endoscopy;;   NASAL POLYP SURGERY     POLYPECTOMY     WRIST FUSION Right    Social History   Occupational History   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

## 2022-12-24 ENCOUNTER — Other Ambulatory Visit: Payer: Self-pay

## 2023-01-05 ENCOUNTER — Other Ambulatory Visit (HOSPITAL_COMMUNITY): Payer: Self-pay

## 2023-01-13 ENCOUNTER — Other Ambulatory Visit (HOSPITAL_COMMUNITY): Payer: Self-pay

## 2023-01-13 ENCOUNTER — Ambulatory Visit (INDEPENDENT_AMBULATORY_CARE_PROVIDER_SITE_OTHER): Payer: Commercial Managed Care - PPO | Admitting: Family

## 2023-01-13 DIAGNOSIS — M25872 Other specified joint disorders, left ankle and foot: Secondary | ICD-10-CM

## 2023-01-13 DIAGNOSIS — M76822 Posterior tibial tendinitis, left leg: Secondary | ICD-10-CM

## 2023-01-13 NOTE — Progress Notes (Signed)
Trying to get caught up starting to feel  Post-Op Visit Note   Patient: Zoe Bailey           Date of Birth: 03-05-1966           MRN: [id number] Visit Date: 01/13/2023 PCP: Kerri Perches, MD  Chief Complaint:  Chief Complaint  Patient presents with   Left Foot - Follow-up    10/28/2022 revision Talonavicular and subtalar fusion     HPI:  HPI the patient is a 57 year old woman who is seen status post revision subtalar and talonavicular fusion.  She has been full weightbearing in her cam walker she reports her pain is stable she has not tried walking in regular shoe she does not feel ready to do so Ortho Exam On examination left ankle her incisions are well-healed there is minimal edema there is mild tenderness about her posterior Achilles.  The Achilles is intact.  She does have a heel cord tightness with dorsiflexion to neutral  Visit Diagnoses: No diagnosis found.  Plan: Due to ongoing discomfort encouraged her to continue the cam walker continue minimizing her weightbearing she does have a kneeling scooter.  She will continue with anti-inflammatories for pain and follow-up in the office with repeat radiographs in 1 month  Follow-Up Instructions: Return in about 26 days (around 02/08/2023).   Imaging: No results found.  Orders:  No orders of the defined types were placed in this encounter.  No orders of the defined types were placed in this encounter.    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   Nicotine dependence in remission 06/12/2022   Posterior tibial tendinitis, left leg 01/16/2022   Chronic pain 01/09/2022   Vitamin D deficiency 10/28/2021   Annual visit for general adult medical examination with abnormal findings 09/16/2021   Back pain 09/16/2021   Allergic rhinitis 09/16/2021   Prediabetes 10/07/2019   Flat feet, bilateral 07/05/2019   Other chronic pain 06/21/2019   Falls  frequently 06/21/2019   Cyst of knee joint 06/21/2019   GERD (gastroesophageal reflux disease) 01/04/2019   Hoarseness, chronic 12/22/2018   Sigmoid diverticulosis 08/11/2017   Essential hypertension 02/28/2015   Joint pain 02/28/2015   Easy bruising 02/19/2014   GAD (generalized anxiety disorder) 01/04/2013   Osteoarthritis of carpometacarpal joint of thumb 04/02/2011   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/02/2011   Overweight (BMI 25.0-29.9) 10/22/2009   Depression with anxiety 10/22/2009   NICOTINE ADDICTION 10/09/2008   Migraine 02/24/2007   Past Medical History:  Diagnosis Date   Anxiety    Arthritis    Cancer (HCC) 2010 approx   skin cancer   Cervical cancer (HCC) 11/12/2022   Complication of anesthesia    Wakes up during all surgeries   COPD (chronic obstructive pulmonary disease) (HCC)    Depression    Dysphagia 12/22/2018   Foot pain, right 03/2014   Hemorrhoid 08/11/2017   Hypertension    Menometrorrhagia 07/14/2013   Migraine    USES BOTOX FOR TREATMENT    Oral phase dysphagia 01/24/2019   Added automatically from request for surgery [id number]   Polymenorrhea 07/11/2013   Started two cycles per month since 03/2013   Shortness of breath dyspnea    Toenail fungus     Family History  Problem Relation Age of Onset   Arthritis Other    Cancer Other     Past Surgical History:  Procedure Laterality Date  ANKLE FRACTURE SURGERY Right    BREAST ENHANCEMENT SURGERY     CARPAL TUNNEL RELEASE Right    x2   CHONDROPLASTY Right 02/17/2018   Procedure: CHONDROPLASTY;  Surgeon: Durene Romans, MD;  Location: [facility];  Service: Orthopedics;  Laterality: Right;   COLONOSCOPY WITH PROPOFOL N/A 05/07/2017   Procedure: COLONOSCOPY WITH PROPOFOL;  Surgeon: Malissa Hippo, MD;  [facility];  Service: Endoscopy;  Laterality: N/A;  8:30   ESOPHAGOGASTRODUODENOSCOPY (EGD) WITH PROPOFOL N/A 03/06/2019   Procedure: ESOPHAGOGASTRODUODENOSCOPY (EGD) WITH  PROPOFOL;  Surgeon: Malissa Hippo, MD;  [facility];  Service: Endoscopy;  Laterality: N/A;   FOOT ARTHRODESIS Left 01/16/2022   Procedure: FUSION OF TALONAVICULAR AND SUBTALAR JOINT LEFT FOOT;  Surgeon: Nadara Mustard, MD;  Location: [facility] OR;  Service: Orthopedics;  Laterality: Left;   FOOT ARTHRODESIS Left 10/28/2022   Procedure: REVISION TALONAVICULAR FUSION AND SUBTALAR JOINT LEFT FOOT;  Surgeon: Nadara Mustard, MD;  Location: [facility];  Service: Orthopedics;  Laterality: Left;   HERNIA REPAIR     umbilical   KNEE ARTHROSCOPY WITH MEDIAL MENISECTOMY Right 02/17/2018   Procedure: KNEE ARTHROSCOPY WITH MEDIAL MENISECTOMY;  Surgeon: Durene Romans, MD;  Location: [facility];  Service: Orthopedics;  Laterality: Right;   MALONEY DILATION  03/06/2019   Procedure: MALONEY DILATION;  Surgeon: Malissa Hippo, MD;  [facility];  Service: Endoscopy;;   NASAL POLYP SURGERY     POLYPECTOMY     WRIST FUSION Right    Social History   Occupational History   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

## 2023-01-18 ENCOUNTER — Telehealth: Payer: Self-pay

## 2023-01-18 DIAGNOSIS — M1711 Unilateral primary osteoarthritis, right knee: Secondary | ICD-10-CM

## 2023-01-18 NOTE — Telephone Encounter (Signed)
Patient came into the office to ask about getting a PA started on her series of 3 gel injections, that she gets every 6 months. Stated she has [employer] now. Please start the process for her request.

## 2023-01-19 ENCOUNTER — Encounter: Payer: Self-pay | Admitting: Family

## 2023-01-19 NOTE — Telephone Encounter (Signed)
I have put in for the approval  Can you make sure we have the correct insurance on file, please?

## 2023-01-20 DIAGNOSIS — L570 Actinic keratosis: Secondary | ICD-10-CM | POA: Diagnosis not present

## 2023-01-20 DIAGNOSIS — C44722 Squamous cell carcinoma of skin of right lower limb, including hip: Secondary | ICD-10-CM | POA: Diagnosis not present

## 2023-01-20 DIAGNOSIS — D0471 Carcinoma in situ of skin of right lower limb, including hip: Secondary | ICD-10-CM | POA: Diagnosis not present

## 2023-01-20 DIAGNOSIS — X32XXXD Exposure to sunlight, subsequent encounter: Secondary | ICD-10-CM | POA: Diagnosis not present

## 2023-02-05 ENCOUNTER — Other Ambulatory Visit: Payer: Self-pay

## 2023-02-08 ENCOUNTER — Other Ambulatory Visit: Payer: Self-pay

## 2023-02-08 ENCOUNTER — Other Ambulatory Visit (HOSPITAL_COMMUNITY): Payer: Self-pay

## 2023-02-08 ENCOUNTER — Other Ambulatory Visit: Payer: Self-pay | Admitting: Family Medicine

## 2023-02-08 MED ORDER — DICLOFENAC SODIUM 75 MG PO TBEC
75.0000 mg | DELAYED_RELEASE_TABLET | Freq: Every day | ORAL | 2 refills | Status: DC | PRN
  Filled 2023-02-08 – 2023-03-15 (×2): qty 30, 30d supply, fill #0
  Filled 2023-04-21: qty 30, 30d supply, fill #1

## 2023-02-09 ENCOUNTER — Ambulatory Visit: Payer: Commercial Managed Care - PPO | Admitting: Family Medicine

## 2023-02-10 ENCOUNTER — Ambulatory Visit: Payer: Commercial Managed Care - PPO | Admitting: Family

## 2023-02-11 ENCOUNTER — Other Ambulatory Visit: Payer: Self-pay

## 2023-02-12 ENCOUNTER — Other Ambulatory Visit: Payer: Self-pay

## 2023-02-12 ENCOUNTER — Telehealth: Payer: Self-pay

## 2023-02-12 NOTE — Telephone Encounter (Signed)
Please schedule patient for 3 appts.with Dr. Romeo Apple for gel injection.  All information has been added under the referrals tab.  Thank you.

## 2023-02-15 ENCOUNTER — Other Ambulatory Visit (HOSPITAL_COMMUNITY): Payer: Self-pay

## 2023-02-15 ENCOUNTER — Other Ambulatory Visit: Payer: Self-pay

## 2023-02-16 NOTE — Telephone Encounter (Signed)
Noted! Thank you

## 2023-02-17 ENCOUNTER — Other Ambulatory Visit: Payer: Self-pay

## 2023-02-22 ENCOUNTER — Ambulatory Visit: Payer: Commercial Managed Care - PPO | Admitting: Orthopedic Surgery

## 2023-03-01 ENCOUNTER — Ambulatory Visit: Payer: Commercial Managed Care - PPO | Admitting: Orthopedic Surgery

## 2023-03-04 ENCOUNTER — Telehealth: Payer: Self-pay | Admitting: Radiology

## 2023-03-04 NOTE — Telephone Encounter (Signed)
Patient called to schedule her hyaluronic acid injections.  I scheduled first one on 03/22/23 at 845 she has to have early am because of her new job Her insurance / cobra has approved the injections for her and I am really struggling to find her 3 visits  I tried to put in 03/29/23 but that day is blocked and I dont know how to proceed  Can you help me find her 3 visits ?   I will call her if you need me to  and tell her, thanks.    I told her she will get a call back, and she voiced understanding.

## 2023-03-05 ENCOUNTER — Ambulatory Visit: Payer: Commercial Managed Care - PPO | Admitting: Family

## 2023-03-08 ENCOUNTER — Ambulatory Visit: Payer: Commercial Managed Care - PPO | Admitting: Orthopedic Surgery

## 2023-03-08 DIAGNOSIS — D485 Neoplasm of uncertain behavior of skin: Secondary | ICD-10-CM | POA: Diagnosis not present

## 2023-03-08 DIAGNOSIS — X32XXXD Exposure to sunlight, subsequent encounter: Secondary | ICD-10-CM | POA: Diagnosis not present

## 2023-03-08 DIAGNOSIS — Z85828 Personal history of other malignant neoplasm of skin: Secondary | ICD-10-CM | POA: Diagnosis not present

## 2023-03-08 DIAGNOSIS — Z08 Encounter for follow-up examination after completed treatment for malignant neoplasm: Secondary | ICD-10-CM | POA: Diagnosis not present

## 2023-03-08 DIAGNOSIS — L570 Actinic keratosis: Secondary | ICD-10-CM | POA: Diagnosis not present

## 2023-03-08 DIAGNOSIS — D225 Melanocytic nevi of trunk: Secondary | ICD-10-CM | POA: Diagnosis not present

## 2023-03-08 DIAGNOSIS — Z1283 Encounter for screening for malignant neoplasm of skin: Secondary | ICD-10-CM | POA: Diagnosis not present

## 2023-03-08 DIAGNOSIS — L905 Scar conditions and fibrosis of skin: Secondary | ICD-10-CM | POA: Diagnosis not present

## 2023-03-08 NOTE — Telephone Encounter (Signed)
Yes, I will have to resubmit after the new year for new VOB.  Patient's PA is still approved until 06/10/2023.

## 2023-03-08 NOTE — Telephone Encounter (Signed)
Will do. Thank you

## 2023-03-15 ENCOUNTER — Other Ambulatory Visit: Payer: Self-pay | Admitting: Family Medicine

## 2023-03-15 ENCOUNTER — Encounter: Payer: Self-pay | Admitting: Radiology

## 2023-03-15 ENCOUNTER — Other Ambulatory Visit (HOSPITAL_COMMUNITY): Payer: Self-pay

## 2023-03-15 ENCOUNTER — Ambulatory Visit: Payer: Commercial Managed Care - PPO | Admitting: Orthopedic Surgery

## 2023-03-16 ENCOUNTER — Other Ambulatory Visit (HOSPITAL_COMMUNITY): Payer: Self-pay

## 2023-03-16 ENCOUNTER — Other Ambulatory Visit: Payer: Self-pay

## 2023-03-16 MED ORDER — ERGOCALCIFEROL 1.25 MG (50000 UT) PO CAPS
50000.0000 [IU] | ORAL_CAPSULE | ORAL | 2 refills | Status: DC
  Filled 2023-03-16: qty 12, 84d supply, fill #0

## 2023-03-18 ENCOUNTER — Telehealth: Payer: Self-pay

## 2023-03-18 NOTE — Telephone Encounter (Signed)
 Submitted for new VOB for Orthovisc, right knee

## 2023-03-22 ENCOUNTER — Ambulatory Visit: Payer: Commercial Managed Care - PPO | Admitting: Orthopedic Surgery

## 2023-03-24 ENCOUNTER — Other Ambulatory Visit: Payer: Self-pay

## 2023-03-24 ENCOUNTER — Telehealth: Payer: Self-pay

## 2023-03-24 DIAGNOSIS — M1711 Unilateral primary osteoarthritis, right knee: Secondary | ICD-10-CM

## 2023-03-24 NOTE — Telephone Encounter (Signed)
 FYI  A new referral has been put in for gel injection due to last referral being closed.   Updated VOB has been added.

## 2023-03-24 NOTE — Progress Notes (Signed)
 Marland Kitchen

## 2023-03-26 ENCOUNTER — Other Ambulatory Visit (HOSPITAL_COMMUNITY): Payer: Self-pay

## 2023-03-26 ENCOUNTER — Ambulatory Visit: Payer: Commercial Managed Care - PPO | Admitting: Orthopedic Surgery

## 2023-03-26 ENCOUNTER — Encounter: Payer: Self-pay | Admitting: Orthopedic Surgery

## 2023-03-26 DIAGNOSIS — M1711 Unilateral primary osteoarthritis, right knee: Secondary | ICD-10-CM | POA: Diagnosis not present

## 2023-03-26 DIAGNOSIS — G8929 Other chronic pain: Secondary | ICD-10-CM

## 2023-03-26 MED ORDER — HYALURONAN 30 MG/2ML IX SOSY
30.0000 mg | PREFILLED_SYRINGE | INTRA_ARTICULAR | Status: AC
Start: 2023-03-26 — End: 2023-04-09
  Administered 2023-03-26 – 2023-04-09 (×3): 30 mg via INTRA_ARTICULAR

## 2023-03-26 NOTE — Progress Notes (Signed)
Patient: Zoe Bailey           Date of Birth: Jul 20, 1965           MRN: [id number] Visit Date: 03/26/2023 Requested by: Kerri Perches, MD [address] [place],  [place] [address] PCP: Kerri Perches, MD  Chief Complaint  Patient presents with   Injections    Right knee orthovisc 1    Encounter Diagnoses  Name Primary?   Chronic pain of right knee Yes   Primary osteoarthritis of right knee     The patient has consented to and requested hyaluronic acid injection   MEDICATION: ORTHOVISC 1/3  right  THE knee is prepped with alcohol and ethyl chloride  The injection is performed with a 21-gauge needle, via inferolateral approach  No complications were noted  Appropriate instructions post injection were given  FU 1 WEEK

## 2023-03-26 NOTE — Patient Instructions (Signed)
Even up shoe lift on [employer]

## 2023-04-02 ENCOUNTER — Encounter: Payer: Self-pay | Admitting: Orthopedic Surgery

## 2023-04-02 ENCOUNTER — Ambulatory Visit: Payer: Commercial Managed Care - PPO | Admitting: Orthopedic Surgery

## 2023-04-02 DIAGNOSIS — G8929 Other chronic pain: Secondary | ICD-10-CM

## 2023-04-02 DIAGNOSIS — M1711 Unilateral primary osteoarthritis, right knee: Secondary | ICD-10-CM

## 2023-04-02 NOTE — Progress Notes (Signed)
Patient: Zoe Bailey           Date of Birth: [birth year]           MRN: [id number] Visit Date: 04/02/2023 Requested by: Kerri Perches, MD [address], [address] [place],  [place] [address] PCP: Kerri Perches, MD  Chief Complaint  Patient presents with   Injections    Right knee    Encounter Diagnoses  Name Primary?   Chronic pain of right knee Yes   Primary osteoarthritis of right knee     The patient has consented to and requested hyaluronic acid injection   MEDICATION: ORTHOCISC  right  THE knee is prepped with alcohol and ethyl chloride  The injection is performed with a 21-gauge needle, via inferolateral approach  No complications were noted  Appropriate instructions post injection were given  FU 1 WEEK

## 2023-04-09 ENCOUNTER — Ambulatory Visit: Payer: Commercial Managed Care - PPO | Admitting: Orthopedic Surgery

## 2023-04-09 VITALS — BP 131/83 | HR 80 | Ht 61.0 in | Wt 150.0 lb

## 2023-04-09 DIAGNOSIS — M1711 Unilateral primary osteoarthritis, right knee: Secondary | ICD-10-CM | POA: Diagnosis not present

## 2023-04-09 MED ORDER — HYALURONAN 30 MG/2ML IX SOSY
15.0000 mg | PREFILLED_SYRINGE | Freq: Once | INTRA_ARTICULAR | Status: AC
Start: 2023-04-09 — End: ?

## 2023-04-09 NOTE — Addendum Note (Signed)
Addended by: Michaele Offer on: 04/09/2023 09:00 AM   Modules accepted: Orders

## 2023-04-09 NOTE — Progress Notes (Signed)
Patient: Zoe Bailey           Date of Birth: [birth year]           MRN: [id number] Visit Date: 04/09/2023 Requested by: Kerri Perches, MD [address] [place],  [place] [address] PCP: Kerri Perches, MD  Chief Complaint  Patient presents with   Knee Pain    Patient is in for her 3rd Orthvisc  she said she is doing well     Encounter Diagnosis  Name Primary?   Primary osteoarthritis of right knee Yes    The patient has consented to and requested hyaluronic acid injection   MEDICATION: ORTHOVISC  right  THE knee is prepped with alcohol and ethyl chloride  The injection is performed with a 21-gauge needle, via inferolateral approach  No complications were noted  Appropriate instructions post injection were given

## 2023-04-09 NOTE — Patient Instructions (Signed)
YOU MAY REPEAT IN 6 MONTHS

## 2023-04-15 ENCOUNTER — Other Ambulatory Visit: Payer: Self-pay

## 2023-04-16 ENCOUNTER — Other Ambulatory Visit: Payer: Self-pay

## 2023-04-16 NOTE — Progress Notes (Signed)
 Disenrolling- Patient to call back and schedule delivery when they have appointment set. Botox  last filled 9.9.24.

## 2023-04-21 ENCOUNTER — Other Ambulatory Visit: Payer: Self-pay | Admitting: Family Medicine

## 2023-04-21 ENCOUNTER — Other Ambulatory Visit (HOSPITAL_COMMUNITY): Payer: Self-pay

## 2023-04-21 MED ORDER — TIOTROPIUM BROMIDE MONOHYDRATE 18 MCG IN CAPS
18.0000 ug | ORAL_CAPSULE | Freq: Every day | RESPIRATORY_TRACT | 3 refills | Status: DC
  Filled 2023-04-21: qty 30, 30d supply, fill #0

## 2023-05-31 ENCOUNTER — Other Ambulatory Visit: Payer: Self-pay | Admitting: Family Medicine

## 2023-05-31 ENCOUNTER — Other Ambulatory Visit (HOSPITAL_COMMUNITY): Payer: Self-pay

## 2023-06-03 ENCOUNTER — Encounter (HOSPITAL_COMMUNITY): Payer: Self-pay

## 2023-06-03 ENCOUNTER — Other Ambulatory Visit (HOSPITAL_COMMUNITY): Payer: Self-pay

## 2023-06-03 ENCOUNTER — Other Ambulatory Visit: Payer: Self-pay | Admitting: Family Medicine

## 2023-06-03 ENCOUNTER — Other Ambulatory Visit: Payer: Self-pay

## 2023-06-03 MED ORDER — DICLOFENAC SODIUM 75 MG PO TBEC
75.0000 mg | DELAYED_RELEASE_TABLET | Freq: Every day | ORAL | 2 refills | Status: DC | PRN
  Filled 2023-06-03: qty 30, 30d supply, fill #0
  Filled 2023-07-06: qty 30, 30d supply, fill #1
  Filled 2023-08-12: qty 30, 30d supply, fill #2

## 2023-06-03 NOTE — Telephone Encounter (Signed)
 Copied from CRM [id number]. Topic: Clinical - Medication Refill >> Jun 03, 2023 10:52 AM Izetta Dakin wrote: Most Recent Primary Care Visit:  Provider: Syliva Overman E  Department: [facility]  Visit Type: PHYSICAL  Date: 11/13/2022  Medication: diclofenac (VOLTAREN) 75 MG EC tablet  Has the patient contacted their pharmacy? Yes (Agent: If no, request that the patient contact the pharmacy for the refill. If patient does not wish to contact the pharmacy document the reason why and proceed with request.) (Agent: If yes, when and what did the pharmacy advise?)  Is this the correct pharmacy for this prescription? Yes If no, delete pharmacy and type the correct one.  This is the patient's preferred pharmacy:  [facility] [address] [place], [place] [address]   Has the prescription been filled recently? No  Is the patient out of the medication? Yes  Has the patient been seen for an appointment in the last year OR does the patient have an upcoming appointment? Yes  Can we respond through MyChart? No  Agent: Please be advised that Rx refills may take up to 3 business days. We ask that you follow-up with your pharmacy.

## 2023-07-06 ENCOUNTER — Other Ambulatory Visit (HOSPITAL_COMMUNITY): Payer: Self-pay

## 2023-08-12 ENCOUNTER — Other Ambulatory Visit: Payer: Self-pay | Admitting: Family Medicine

## 2023-08-12 ENCOUNTER — Other Ambulatory Visit (HOSPITAL_COMMUNITY): Payer: Self-pay

## 2023-08-12 ENCOUNTER — Other Ambulatory Visit: Payer: Self-pay

## 2023-08-12 MED ORDER — TRIAMTERENE-HCTZ 75-50 MG PO TABS
1.0000 | ORAL_TABLET | Freq: Every day | ORAL | 3 refills | Status: DC
  Filled 2023-08-12: qty 90, 90d supply, fill #0

## 2023-08-12 MED ORDER — CYCLOBENZAPRINE HCL 10 MG PO TABS
10.0000 mg | ORAL_TABLET | Freq: Three times a day (TID) | ORAL | 2 refills | Status: DC
  Filled 2023-08-12: qty 90, 30d supply, fill #0

## 2023-08-13 ENCOUNTER — Other Ambulatory Visit (HOSPITAL_COMMUNITY): Payer: Self-pay

## 2023-08-13 ENCOUNTER — Other Ambulatory Visit (HOSPITAL_BASED_OUTPATIENT_CLINIC_OR_DEPARTMENT_OTHER): Payer: Self-pay

## 2023-08-13 ENCOUNTER — Encounter: Payer: Self-pay | Admitting: Family Medicine

## 2023-08-13 ENCOUNTER — Other Ambulatory Visit (HOSPITAL_COMMUNITY): Payer: Self-pay | Admitting: Family Medicine

## 2023-08-13 ENCOUNTER — Other Ambulatory Visit: Payer: Self-pay

## 2023-08-13 ENCOUNTER — Encounter (HOSPITAL_COMMUNITY): Payer: Self-pay

## 2023-08-13 ENCOUNTER — Ambulatory Visit: Payer: Self-pay | Admitting: Family Medicine

## 2023-08-13 VITALS — BP 121/80 | HR 86 | Resp 16 | Ht 61.0 in | Wt 153.1 lb

## 2023-08-13 DIAGNOSIS — R233 Spontaneous ecchymoses: Secondary | ICD-10-CM

## 2023-08-13 DIAGNOSIS — F411 Generalized anxiety disorder: Secondary | ICD-10-CM

## 2023-08-13 DIAGNOSIS — F17200 Nicotine dependence, unspecified, uncomplicated: Secondary | ICD-10-CM

## 2023-08-13 DIAGNOSIS — G43119 Migraine with aura, intractable, without status migrainosus: Secondary | ICD-10-CM

## 2023-08-13 DIAGNOSIS — F418 Other specified anxiety disorders: Secondary | ICD-10-CM

## 2023-08-13 DIAGNOSIS — R87610 Atypical squamous cells of undetermined significance on cytologic smear of cervix (ASC-US): Secondary | ICD-10-CM

## 2023-08-13 DIAGNOSIS — R87810 Cervical high risk human papillomavirus (HPV) DNA test positive: Secondary | ICD-10-CM

## 2023-08-13 DIAGNOSIS — G894 Chronic pain syndrome: Secondary | ICD-10-CM

## 2023-08-13 DIAGNOSIS — Z1231 Encounter for screening mammogram for malignant neoplasm of breast: Secondary | ICD-10-CM

## 2023-08-13 DIAGNOSIS — E663 Overweight: Secondary | ICD-10-CM

## 2023-08-13 DIAGNOSIS — I10 Essential (primary) hypertension: Secondary | ICD-10-CM

## 2023-08-13 DIAGNOSIS — Z0289 Encounter for other administrative examinations: Secondary | ICD-10-CM | POA: Insufficient documentation

## 2023-08-13 MED ORDER — DULOXETINE HCL 60 MG PO CPEP
60.0000 mg | ORAL_CAPSULE | Freq: Every day | ORAL | 1 refills | Status: AC
  Filled 2023-08-13: qty 90, 90d supply, fill #0
  Filled 2024-04-07: qty 90, 90d supply, fill #1

## 2023-08-13 MED ORDER — AMLODIPINE BESYLATE 10 MG PO TABS
10.0000 mg | ORAL_TABLET | Freq: Every day | ORAL | 3 refills | Status: AC
  Filled 2023-08-13: qty 90, 90d supply, fill #0

## 2023-08-13 MED ORDER — DICLOFENAC SODIUM 75 MG PO TBEC
75.0000 mg | DELAYED_RELEASE_TABLET | Freq: Every day | ORAL | 1 refills | Status: AC | PRN
  Filled 2023-08-13 – 2023-09-17 (×2): qty 90, 90d supply, fill #0
  Filled 2024-01-11: qty 90, 90d supply, fill #1

## 2023-08-13 MED ORDER — GABAPENTIN 400 MG PO CAPS
400.0000 mg | ORAL_CAPSULE | Freq: Four times a day (QID) | ORAL | 5 refills | Status: AC
  Filled 2023-08-13: qty 120, 30d supply, fill #0
  Filled 2023-12-06: qty 120, 30d supply, fill #1
  Filled 2024-02-10: qty 120, 30d supply, fill #2
  Filled 2024-04-07: qty 120, 30d supply, fill #3

## 2023-08-13 MED ORDER — CYCLOBENZAPRINE HCL 10 MG PO TABS
10.0000 mg | ORAL_TABLET | Freq: Three times a day (TID) | ORAL | 5 refills | Status: DC
  Filled 2023-08-13 – 2024-01-11 (×2): qty 90, 30d supply, fill #0

## 2023-08-13 MED ORDER — MONTELUKAST SODIUM 10 MG PO TABS
10.0000 mg | ORAL_TABLET | Freq: Every day | ORAL | 3 refills | Status: AC
  Filled 2023-08-13: qty 90, 90d supply, fill #0

## 2023-08-13 MED ORDER — HYDROCODONE-ACETAMINOPHEN 5-325 MG PO TABS
1.0000 | ORAL_TABLET | Freq: Every day | ORAL | 0 refills | Status: DC
  Filled 2023-08-13 – 2023-08-23 (×2): qty 30, 30d supply, fill #0

## 2023-08-13 MED ORDER — METHYLPREDNISOLONE ACETATE 80 MG/ML IJ SUSP
80.0000 mg | Freq: Once | INTRAMUSCULAR | Status: AC
  Administered 2023-08-13: 80 mg via INTRAMUSCULAR

## 2023-08-13 MED ORDER — TIOTROPIUM BROMIDE MONOHYDRATE 18 MCG IN CAPS
18.0000 ug | ORAL_CAPSULE | Freq: Every day | RESPIRATORY_TRACT | 3 refills | Status: AC
  Filled 2023-08-13: qty 30, 30d supply, fill #0

## 2023-08-13 MED ORDER — TRIAMTERENE-HCTZ 75-50 MG PO TABS
1.0000 | ORAL_TABLET | Freq: Every day | ORAL | 3 refills | Status: AC
  Filled 2023-08-13 – 2023-12-06 (×2): qty 90, 90d supply, fill #0
  Filled 2024-04-07: qty 90, 90d supply, fill #1

## 2023-08-13 NOTE — Patient Instructions (Addendum)
 F/u in 11 weeks, call if you need me sooner  Pls schedule mammogram at checkout (Monday only)  Please schedule your Gyne exam I have referred you  Fasting labs next Monday, nurse pls add cBC also PT/PTT t labs ordered in  September  Now smoking 10 ciggs/day, try to cut back gradually on use  Sign pain contract  today  Urine tox today  Depo Medrol  80 mg IM in office today for pain  Thanks for choosing [facility], we consider it a privelige to serve you.

## 2023-08-13 NOTE — Assessment & Plan Note (Signed)
 No botox  currently and does not feel the need for them right now

## 2023-08-13 NOTE — Assessment & Plan Note (Signed)
 Uncontrolled , depo medrol  80 mg iM in office Pain contract  signed Urine tox today  Commit to daily cymbalta  and GABAPENTIN  AS PRESCRIBED  ADD HYDROCODONE  5 MG AT BEDTIME

## 2023-08-13 NOTE — Assessment & Plan Note (Signed)
Asked:confirms currently smokes cigarettes 10/day Assess: Unwilling to set a quit date, but is cutting back Advise: needs to QUIT to reduce risk of cancer, cardio and cerebrovascular disease Assist: counseled for 5 minutes and literature provided Arrange: follow up in 2 to 4 months  

## 2023-08-13 NOTE — Assessment & Plan Note (Signed)
 Pt started on hydrocodone  5 mg at bedtime for chronic pain Contract signed  UDS sent F/u in 11 weeks

## 2023-08-13 NOTE — Assessment & Plan Note (Signed)
 Controlled, no change in medication

## 2023-08-13 NOTE — Assessment & Plan Note (Signed)
 Controlled, no change in medication DASH diet and commitment to daily physical activity for a minimum of 30 minutes discussed and encouraged, as a part of hypertension management. The importance of attaining a healthy weight is also discussed.     08/13/2023    8:04 AM 04/09/2023    8:50 AM 11/13/2022    1:31 PM 11/12/2022    8:35 AM 10/28/2022   11:45 AM 10/28/2022   11:30 AM 10/28/2022   11:26 AM  BP/Weight  Systolic BP 121 131 118 124 117 118 122  Diastolic BP 80 83 81 76 70 99 98  Wt. (Lbs) 153.08 150 154      BMI 28.92 kg/m2 28.34 kg/m2 29.1 kg/m2

## 2023-08-13 NOTE — Assessment & Plan Note (Signed)
 Check Pt/PTT and cbc

## 2023-08-13 NOTE — Assessment & Plan Note (Signed)
 Controlled on meds, doing very well

## 2023-08-13 NOTE — Assessment & Plan Note (Signed)
  Patient re-educated about  the importance of commitment to a  minimum of 150 minutes of exercise per week as able.  The importance of healthy food choices with portion control discussed, as well as eating regularly and within a 12 hour window most days. The need to choose "clean , green" food 50 to 75% of the time is discussed, as well as to make water  the primary drink and set a goal of 64 ounces water  daily.       08/13/2023    8:04 AM 04/09/2023    8:50 AM 11/13/2022    1:31 PM  Weight /BMI  Weight 153 lb 1.3 oz 150 lb 154 lb  Height 5\' 1"  (1.549 m) 5\' 1"  (1.549 m) 5\' 1"  (1.549 m)  BMI 28.92 kg/m2 28.34 kg/m2 29.1 kg/m2    stable
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
## 2023-08-14 MED ORDER — HYDROCODONE-ACETAMINOPHEN 5-325 MG PO TABS: ORAL_TABLET | ORAL | 0 refills | Status: AC

## 2023-08-14 NOTE — Addendum Note (Signed)
 Addended by: Towanda Fret on: 08/14/2023 10:32 PM   Modules accepted: Level of Service

## 2023-08-16 ENCOUNTER — Telehealth: Payer: Self-pay

## 2023-08-16 ENCOUNTER — Telehealth: Payer: Self-pay | Admitting: Family Medicine

## 2023-08-16 NOTE — Telephone Encounter (Signed)
 Copied from CRM [id number]. Topic: Clinical - Medication Question >> Aug 16, 2023 12:57 PM Everlene Hobby D wrote: Patient would like her prescription to go to the [facility] in [place] off of [address]

## 2023-08-16 NOTE — Telephone Encounter (Unsigned)
 Copied from CRM [id number]. Topic: Clinical - Medication Question >> Aug 16, 2023  9:01 AM El Gravely T wrote: Reason for CRM: Reason for CRM: Jonelle from [facility] pharmacy calling, regarding multiple scripts sent in for patient for the same medication, HYDROcodone -acetaminophen  (NORCO/VICODIN) 5-325 MG tablet.  Pharmacist can be reached at [phone] [facility].  Please call back to discuss further

## 2023-08-16 NOTE — Telephone Encounter (Signed)
 Lvm for pt to cb. Wanting to confirm which pharmacy pt wanted the hydrocodone  sent to

## 2023-08-17 NOTE — Telephone Encounter (Signed)
 Sent from 08/13/2023

## 2023-08-19 LAB — TOXASSURE SELECT 13 (MW), URINE

## 2023-08-23 ENCOUNTER — Ambulatory Visit (HOSPITAL_COMMUNITY)
Admission: RE | Admit: 2023-08-23 | Discharge: 2023-08-23 | Disposition: A | Discharge status: Home / Self Care | Payer: Self-pay | Source: Ambulatory Visit | Attending: Family Medicine | Admitting: Family Medicine

## 2023-08-23 ENCOUNTER — Other Ambulatory Visit (HOSPITAL_COMMUNITY): Payer: Self-pay

## 2023-08-23 DIAGNOSIS — Z1231 Encounter for screening mammogram for malignant neoplasm of breast: Secondary | ICD-10-CM | POA: Diagnosis present

## 2023-09-06 ENCOUNTER — Telehealth: Payer: Self-pay

## 2023-09-06 DIAGNOSIS — G8929 Other chronic pain: Secondary | ICD-10-CM

## 2023-09-06 DIAGNOSIS — M1711 Unilateral primary osteoarthritis, right knee: Secondary | ICD-10-CM

## 2023-09-06 NOTE — Telephone Encounter (Signed)
 Patient stopped by to ask if we would put in a referral for gel injections, she has new insurance and it is in the system. I told her that we would contact her when we get an authorization from her insurance

## 2023-09-13 ENCOUNTER — Telehealth: Payer: Self-pay

## 2023-09-13 NOTE — Telephone Encounter (Signed)
 Next available gel injection would need to be after 10/07/2023.  VOB submitted for Orthovisc, right knee

## 2023-09-17 ENCOUNTER — Other Ambulatory Visit (HOSPITAL_COMMUNITY): Payer: Self-pay

## 2023-09-20 ENCOUNTER — Other Ambulatory Visit (HOSPITAL_COMMUNITY): Payer: Self-pay

## 2023-09-20 ENCOUNTER — Other Ambulatory Visit: Payer: Self-pay | Admitting: Family Medicine

## 2023-09-21 ENCOUNTER — Other Ambulatory Visit (HOSPITAL_COMMUNITY): Payer: Self-pay

## 2023-09-23 ENCOUNTER — Other Ambulatory Visit (HOSPITAL_COMMUNITY): Payer: Self-pay

## 2023-09-24 ENCOUNTER — Telehealth: Payer: Self-pay

## 2023-09-24 NOTE — Telephone Encounter (Signed)
 Copied from CRM [id number]. Topic: Clinical - Medication Question >> Aug 16, 2023  9:01 AM Treva T wrote: Reason for CRM: Reason for CRM: Jonelle from [facility] pharmacy calling, regarding multiple scripts sent in for patient for the same medication, HYDROcodone -acetaminophen  (NORCO/VICODIN) 5-325 MG tablet.  Pharmacist can be reached at [phone] [facility].  Please call back to discuss further >> Sep 23, 2023  3:23 PM Emylou G wrote: Patient calling checking status of HYDROcodone -acetaminophen  (NORCO/VICODIN) 5-325 MG tablet.?

## 2023-09-27 ENCOUNTER — Other Ambulatory Visit: Payer: Self-pay | Admitting: Family Medicine

## 2023-09-27 ENCOUNTER — Other Ambulatory Visit (HOSPITAL_COMMUNITY): Payer: Self-pay

## 2023-09-27 NOTE — Telephone Encounter (Unsigned)
 Copied from CRM [id number]. Topic: Clinical - Medication Refill >> Sep 27, 2023 12:43 PM Carlatta H wrote: Medication: HYDROcodone -acetaminophen  (NORCO/VICODIN) 5-325 MG tablet [[id number]]  Has the patient contacted their pharmacy? No (Agent: If no, request that the patient contact the pharmacy for the refill. If patient does not wish to contact the pharmacy document the reason why and proceed with request.) (Agent: If yes, when and what did the pharmacy advise?)  This is the patient's preferred pharmacy:  [facility] [place] [[id number]] Phone: [phone] Fax: [phone]  Is this the correct pharmacy for this prescription? Yes If no, delete pharmacy and type the correct one.   Has the prescription been filled recently? No  Is the patient out of the medication? Yes  Has the patient been seen for an appointment in the last year OR does the patient have an upcoming appointment? Yes  Can we respond through MyChart? No  Agent: Please be advised that Rx refills may take up to 3 business days. We ask that you follow-up with your pharmacy.

## 2023-10-01 ENCOUNTER — Other Ambulatory Visit: Payer: Self-pay | Admitting: Family Medicine

## 2023-10-01 ENCOUNTER — Other Ambulatory Visit (HOSPITAL_COMMUNITY): Payer: Self-pay

## 2023-10-04 ENCOUNTER — Other Ambulatory Visit (HOSPITAL_COMMUNITY): Payer: Self-pay

## 2023-10-05 ENCOUNTER — Other Ambulatory Visit (HOSPITAL_COMMUNITY): Payer: Self-pay

## 2023-10-05 ENCOUNTER — Telehealth: Payer: Self-pay

## 2023-10-05 NOTE — Telephone Encounter (Signed)
 FYI-  Called and left a VM advising patient that her insurance [employer] does not cover gel injections at all.  Advised of TriVisc and to CB to discuss further if wanting to proceed.

## 2023-10-05 NOTE — Telephone Encounter (Signed)
 Yes

## 2023-10-06 ENCOUNTER — Telehealth: Payer: Self-pay

## 2023-10-06 NOTE — Telephone Encounter (Signed)
 Talked with patient concerning TriVisc injections.  Advised patient that she will receive a call to pay for medication.  Patient voiced that she understands.  Submitted for TriVisc, right knee through [facility] portal.

## 2023-10-08 ENCOUNTER — Other Ambulatory Visit (HOSPITAL_COMMUNITY): Payer: Self-pay

## 2023-10-13 ENCOUNTER — Telehealth: Payer: Self-pay | Admitting: Orthopedic Surgery

## 2023-10-13 NOTE — Telephone Encounter (Signed)
 Patient called and had questions about the injections. CB#[phone]

## 2023-10-13 NOTE — Telephone Encounter (Signed)
 Talked to patient to clarify where TriVisc injections will be delivered.

## 2023-10-14 ENCOUNTER — Telehealth: Payer: Self-pay

## 2023-10-14 ENCOUNTER — Other Ambulatory Visit: Payer: Self-pay

## 2023-10-14 DIAGNOSIS — M1711 Unilateral primary osteoarthritis, right knee: Secondary | ICD-10-CM

## 2023-10-14 NOTE — Telephone Encounter (Signed)
 Please schedule patient for 3 appts.with Dr. Margrette for TriVisc, right knee  Will make sure the medication is at your office before her appt.  Thank you

## 2023-10-29 ENCOUNTER — Encounter: Payer: Self-pay | Admitting: Radiology

## 2023-10-29 ENCOUNTER — Ambulatory Visit: Payer: Self-pay | Admitting: Family Medicine

## 2023-11-15 ENCOUNTER — Ambulatory Visit (INDEPENDENT_AMBULATORY_CARE_PROVIDER_SITE_OTHER): Admitting: Orthopedic Surgery

## 2023-11-15 DIAGNOSIS — M1711 Unilateral primary osteoarthritis, right knee: Secondary | ICD-10-CM | POA: Diagnosis not present

## 2023-11-15 NOTE — Progress Notes (Signed)
 Patient: Zoe Bailey           Date of Birth: 1966/02/09           MRN: [id number] Visit Date: 11/15/2023 Requested by: Antonetta Rollene BRAVO, MD [address], [address] [place],  [place] [address] PCP: Antonetta Rollene BRAVO, MD  Chief Complaint  Patient presents with   Injections    Right knee- Trivisc #1 of 3     No diagnosis found.  The patient has consented to and requested hyaluronic acid injection   MEDICATION: TRIVISC  right  THE knee is prepped with alcohol and ethyl chloride  The injection is performed with a 21-gauge needle, via inferolateral approach  No complications were noted  Appropriate instructions post injection were given   RETURN IN A WEEK

## 2023-11-15 NOTE — Progress Notes (Signed)
   LMP 04/09/2017 (Approximate)   There is no height or weight on file to calculate BMI.  Chief Complaint  Patient presents with   Injections    Right knee- Trivisc #1 of 3     No diagnosis found.  DOI/DOS/ Date: 11/15/23  Worse

## 2023-11-15 NOTE — Progress Notes (Signed)
 Trivisc  Can not put in [facility]  lot T-1 10/06/2024  [id number]  Patient purchased meds so no charge meds / needs injection charges only

## 2023-11-22 ENCOUNTER — Ambulatory Visit: Admitting: Orthopedic Surgery

## 2023-11-22 DIAGNOSIS — M1711 Unilateral primary osteoarthritis, right knee: Secondary | ICD-10-CM

## 2023-11-22 NOTE — Progress Notes (Signed)
 Patient: Zoe Bailey           Date of Birth: 1965/07/15           MRN: [id number] Visit Date: 11/22/2023 Requested by: Antonetta Rollene BRAVO, MD [address], [address] [place],  [place] [address] PCP: Antonetta Rollene BRAVO, MD  Chief Complaint  Patient presents with   Knee Problem    Right / injections     Encounter Diagnosis  Name Primary?   Primary osteoarthritis of right knee Yes    The patient has consented to and requested hyaluronic acid injection   MEDICATION: TRIVISC  right  THE knee is prepped with alcohol and ethyl chloride  The injection is performed with a 21-gauge needle, via inferolateral approach  No complications were noted  Appropriate instructions post injection were given  1 WEEK

## 2023-11-22 NOTE — Progress Notes (Signed)
 Trivisc [id number] 04 25mg / 2.92mL Lot T1 10/06/2024

## 2023-11-29 ENCOUNTER — Ambulatory Visit (INDEPENDENT_AMBULATORY_CARE_PROVIDER_SITE_OTHER): Admitting: Orthopedic Surgery

## 2023-11-29 DIAGNOSIS — M1711 Unilateral primary osteoarthritis, right knee: Secondary | ICD-10-CM

## 2023-11-29 NOTE — Progress Notes (Signed)
 Patient: Zoe Bailey           Date of Birth: December 04, 1965           MRN: [id number] Visit Date: 11/29/2023 Requested by: Antonetta Rollene BRAVO, MD [address] [place],  [place] [address] PCP: Antonetta Rollene BRAVO, MD  Chief Complaint  Patient presents with   Knee Pain    3 RD [facility] RIGHT KNEE W TRIVISC    Encounter Diagnosis  Name Primary?   Primary osteoarthritis of right knee Yes    The patient has consented to and requested hyaluronic acid injection   MEDICATION: trivisc 3/3  right  THE knee is prepped with alcohol and ethyl chloride  The injection is performed with a 21-gauge needle, via inferolateral approach  No complications were noted  Appropriate instructions post injection were given  FU 1 WEEK OR 2

## 2023-12-06 ENCOUNTER — Other Ambulatory Visit: Payer: Self-pay

## 2023-12-06 ENCOUNTER — Other Ambulatory Visit (HOSPITAL_COMMUNITY): Payer: Self-pay

## 2024-01-10 ENCOUNTER — Encounter: Payer: Self-pay | Admitting: Radiology

## 2024-01-11 ENCOUNTER — Other Ambulatory Visit (HOSPITAL_COMMUNITY): Payer: Self-pay

## 2024-01-11 ENCOUNTER — Other Ambulatory Visit: Payer: Self-pay

## 2024-02-10 ENCOUNTER — Other Ambulatory Visit (HOSPITAL_COMMUNITY): Payer: Self-pay

## 2024-02-11 ENCOUNTER — Other Ambulatory Visit (HOSPITAL_COMMUNITY)
Admission: RE | Admit: 2024-02-11 | Discharge: 2024-02-11 | Disposition: A | Source: Ambulatory Visit | Attending: Obstetrics & Gynecology | Admitting: Obstetrics & Gynecology

## 2024-02-11 ENCOUNTER — Ambulatory Visit: Admitting: Obstetrics & Gynecology

## 2024-02-11 ENCOUNTER — Encounter: Payer: Self-pay | Admitting: Obstetrics & Gynecology

## 2024-02-11 VITALS — BP 148/90 | HR 80 | Ht 61.0 in | Wt 157.6 lb

## 2024-02-11 DIAGNOSIS — Z01419 Encounter for gynecological examination (general) (routine) without abnormal findings: Secondary | ICD-10-CM | POA: Diagnosis not present

## 2024-02-11 DIAGNOSIS — Z87410 Personal history of cervical dysplasia: Secondary | ICD-10-CM | POA: Diagnosis not present

## 2024-02-11 NOTE — Progress Notes (Signed)
 WELL-WOMAN EXAMINATION Patient name: Zoe Bailey MRN [id number]  Date of birth: 20-Feb-1966 Chief Complaint:   Gynecologic Exam  History of Present Illness:   Zoe Bailey is a 58 y.o. G1P1 female being seen today for a routine well-woman exam.   She denies vaginal bleeding, discharge, itching or irritation.  Denies urinary concerns.  Denies pelvic or abdominal pain.  Reports no acute GYN complaints  Patient's last menstrual period was 04/09/2017 (approximate).  The current method of family planning is post menopausal status.    Last pap 2023-ASCUS,H+ >CIN 1>10/2022 ASCUS,H+> CIN 1.  Last mammogram: 08/2023. Last colonoscopy: 2019     08/13/2023    8:06 AM 11/13/2022    1:39 PM 10/20/2022    2:12 PM 06/12/2022    1:55 PM 04/17/2022    1:40 PM  Depression screen PHQ 2/9  Decreased Interest 1 0 1 0 1  Down, Depressed, Hopeless 0 0 1 1 1   PHQ - 2 Score 1 0 2 1 2   Altered sleeping 1 0 2 1 1   Tired, decreased energy 1 0 2 1 1   Change in appetite 0 0 2 0 1  Feeling bad or failure about yourself  0 0 1 0 0  Trouble concentrating 0 0 0 0 0  Moving slowly or fidgety/restless 0 0 0 0 0  Suicidal thoughts 0 0 0 0 0  PHQ-9 Score 3  0  9  3  5    Difficult doing work/chores Not difficult at all Not difficult at all  Not difficult at all Somewhat difficult     Data saved with a previous flowsheet row definition      Review of Systems:   Pertinent items are noted in HPI Denies any headaches, blurred vision, fatigue, shortness of breath, chest pain, abdominal pain, bowel movements, urination, or intercourse unless otherwise stated above.  Pertinent History Reviewed:  Reviewed past medical,surgical, social and family history.  Reviewed problem list, medications and allergies. Physical Assessment:   Vitals:   02/11/24 0837 02/11/24 0905  BP: (!) 154/91 (!) 148/90  Pulse: 80 80  Weight: 157 lb 9.6 oz (71.5 kg)   Height: 5' 1 (1.549 m)   Body mass index is 29.78 kg/m.        Physical  Examination:   General appearance - well appearing, and in no distress  Mental status - alert, oriented to person, place, and time  Psych:  She has a normal mood and affect  Skin - warm and dry, normal color, no suspicious lesions noted  Chest - effort normal, all lung fields clear to auscultation bilaterally  Heart - normal rate and regular rhythm  Neck:  midline trachea, no thyromegaly or nodules  Breasts - breasts appear normal- difficult to assess due to bilateral implants, no suspicious masses, no skin or nipple changes or  axillary nodes  Abdomen - soft, nontender, nondistended, no masses or organomegaly  Pelvic - VULVA: normal appearing vulva with no masses, tenderness or lesions  VAGINA: normal appearing vagina with normal color and discharge, no lesions  CERVIX: normal appearing cervix without discharge or lesions, no CMT  Thin prep pap is done with HR HPV cotesting  UTERUS: uterus is felt to be normal size, shape, consistency and nontender   ADNEXA: No adnexal masses or tenderness noted.  Extremities:  No swelling or varicosities noted  Chaperone: Alan Fischer     Assessment & Plan:  1) Well-Woman Exam, h/o cervical dysplasia -pap collected due to  prior h/o cervical dysplasia, further management pending results - Other screening exams up-to-date  No orders of the defined types were placed in this encounter.   Meds: No orders of the defined types were placed in this encounter.   Follow-up: Return in about 1 year (around 02/10/2025) for Annual.   Lewi Drost, DO Attending Obstetrician & Gynecologist, [facility] for [facility], [facility]

## 2024-02-16 ENCOUNTER — Ambulatory Visit: Payer: Self-pay | Admitting: Obstetrics & Gynecology

## 2024-02-16 LAB — CYTOLOGY - PAP
Comment: NEGATIVE
Comment: NEGATIVE
Comment: NEGATIVE
Diagnosis: NEGATIVE
HPV 16: NEGATIVE
HPV 18 / 45: NEGATIVE
High risk HPV: POSITIVE — AB

## 2024-04-07 ENCOUNTER — Telehealth: Payer: Self-pay

## 2024-04-07 ENCOUNTER — Other Ambulatory Visit: Payer: Self-pay

## 2024-04-07 ENCOUNTER — Other Ambulatory Visit (HOSPITAL_COMMUNITY): Payer: Self-pay

## 2024-04-07 ENCOUNTER — Other Ambulatory Visit: Payer: Self-pay | Admitting: Family Medicine

## 2024-04-07 MED ORDER — CYCLOBENZAPRINE HCL 10 MG PO TABS
10.0000 mg | ORAL_TABLET | Freq: Three times a day (TID) | ORAL | 0 refills | Status: AC | PRN
  Filled 2024-04-07: qty 90, 30d supply, fill #0

## 2024-04-07 NOTE — Telephone Encounter (Signed)
 Pt requesting refill of Cyclobenzaprine , no longer on active med list, please advise
